# Patient Record
Sex: Male | Born: 1950 | Race: Black or African American | Hispanic: No | Marital: Single | Smoking: Never smoker
Health system: Southern US, Community
[De-identification: ages and names within clinical notes are randomized; demographics above are authoritative.]

## PROBLEM LIST (undated history)

## (undated) DIAGNOSIS — E119 Type 2 diabetes mellitus without complications: Secondary | ICD-10-CM

## (undated) DIAGNOSIS — I10 Essential (primary) hypertension: Secondary | ICD-10-CM

## (undated) DIAGNOSIS — R739 Hyperglycemia, unspecified: Secondary | ICD-10-CM

## (undated) DIAGNOSIS — E785 Hyperlipidemia, unspecified: Secondary | ICD-10-CM

## (undated) DIAGNOSIS — N189 Chronic kidney disease, unspecified: Secondary | ICD-10-CM

## (undated) DIAGNOSIS — N179 Acute kidney failure, unspecified: Secondary | ICD-10-CM

## (undated) DIAGNOSIS — R748 Abnormal levels of other serum enzymes: Secondary | ICD-10-CM

## (undated) DIAGNOSIS — D631 Anemia in chronic kidney disease: Secondary | ICD-10-CM

## (undated) HISTORY — DX: Hyperlipidemia, unspecified: E78.5

## (undated) HISTORY — DX: Anemia in chronic kidney disease: D63.1

## (undated) HISTORY — DX: Hyperglycemia, unspecified: R73.9

## (undated) HISTORY — DX: Abnormal levels of other serum enzymes: R74.8

## (undated) HISTORY — DX: Acute kidney failure, unspecified: N17.9

## (undated) HISTORY — DX: Chronic kidney disease, unspecified: N18.9

---

## 2017-09-02 ENCOUNTER — Encounter (HOSPITAL_COMMUNITY): Payer: Self-pay

## 2017-09-02 ENCOUNTER — Other Ambulatory Visit: Payer: Self-pay

## 2017-09-02 ENCOUNTER — Inpatient Hospital Stay (HOSPITAL_COMMUNITY)
Admission: EM | Admit: 2017-09-02 | Discharge: 2017-09-04 | DRG: 641 | Disposition: A | Discharge status: Home / Self Care | Payer: Medicare Other | Attending: Family Medicine | Admitting: Family Medicine

## 2017-09-02 ENCOUNTER — Ambulatory Visit (HOSPITAL_COMMUNITY)
Admission: EM | Admit: 2017-09-02 | Discharge: 2017-09-02 | Disposition: A | Discharge status: Home / Self Care | Payer: Medicare Other | Source: Home / Self Care | Attending: Internal Medicine | Admitting: Internal Medicine

## 2017-09-02 DIAGNOSIS — E118 Type 2 diabetes mellitus with unspecified complications: Secondary | ICD-10-CM

## 2017-09-02 DIAGNOSIS — D631 Anemia in chronic kidney disease: Secondary | ICD-10-CM | POA: Diagnosis present

## 2017-09-02 DIAGNOSIS — E785 Hyperlipidemia, unspecified: Secondary | ICD-10-CM | POA: Diagnosis present

## 2017-09-02 DIAGNOSIS — K760 Fatty (change of) liver, not elsewhere classified: Secondary | ICD-10-CM | POA: Diagnosis present

## 2017-09-02 DIAGNOSIS — R112 Nausea with vomiting, unspecified: Secondary | ICD-10-CM

## 2017-09-02 DIAGNOSIS — R0601 Orthopnea: Secondary | ICD-10-CM | POA: Diagnosis present

## 2017-09-02 DIAGNOSIS — E1129 Type 2 diabetes mellitus with other diabetic kidney complication: Secondary | ICD-10-CM | POA: Diagnosis present

## 2017-09-02 DIAGNOSIS — R7989 Other specified abnormal findings of blood chemistry: Secondary | ICD-10-CM | POA: Diagnosis present

## 2017-09-02 DIAGNOSIS — R1013 Epigastric pain: Secondary | ICD-10-CM

## 2017-09-02 DIAGNOSIS — E1165 Type 2 diabetes mellitus with hyperglycemia: Secondary | ICD-10-CM | POA: Diagnosis present

## 2017-09-02 DIAGNOSIS — E860 Dehydration: Secondary | ICD-10-CM | POA: Diagnosis present

## 2017-09-02 DIAGNOSIS — R402143 Coma scale, eyes open, spontaneous, at hospital admission: Secondary | ICD-10-CM | POA: Diagnosis present

## 2017-09-02 DIAGNOSIS — N179 Acute kidney failure, unspecified: Secondary | ICD-10-CM

## 2017-09-02 DIAGNOSIS — N189 Chronic kidney disease, unspecified: Secondary | ICD-10-CM | POA: Diagnosis present

## 2017-09-02 DIAGNOSIS — R739 Hyperglycemia, unspecified: Secondary | ICD-10-CM | POA: Diagnosis not present

## 2017-09-02 DIAGNOSIS — R197 Diarrhea, unspecified: Secondary | ICD-10-CM | POA: Diagnosis present

## 2017-09-02 DIAGNOSIS — R402253 Coma scale, best verbal response, oriented, at hospital admission: Secondary | ICD-10-CM | POA: Diagnosis present

## 2017-09-02 DIAGNOSIS — Z7984 Long term (current) use of oral hypoglycemic drugs: Secondary | ICD-10-CM | POA: Diagnosis not present

## 2017-09-02 DIAGNOSIS — R531 Weakness: Secondary | ICD-10-CM

## 2017-09-02 DIAGNOSIS — E861 Hypovolemia: Secondary | ICD-10-CM | POA: Diagnosis present

## 2017-09-02 DIAGNOSIS — Z23 Encounter for immunization: Secondary | ICD-10-CM | POA: Diagnosis not present

## 2017-09-02 DIAGNOSIS — Z79899 Other long term (current) drug therapy: Secondary | ICD-10-CM | POA: Diagnosis not present

## 2017-09-02 DIAGNOSIS — R402363 Coma scale, best motor response, obeys commands, at hospital admission: Secondary | ICD-10-CM | POA: Diagnosis present

## 2017-09-02 DIAGNOSIS — E875 Hyperkalemia: Principal | ICD-10-CM

## 2017-09-02 DIAGNOSIS — R6883 Chills (without fever): Secondary | ICD-10-CM | POA: Diagnosis present

## 2017-09-02 DIAGNOSIS — I129 Hypertensive chronic kidney disease with stage 1 through stage 4 chronic kidney disease, or unspecified chronic kidney disease: Secondary | ICD-10-CM | POA: Diagnosis present

## 2017-09-02 DIAGNOSIS — R0600 Dyspnea, unspecified: Secondary | ICD-10-CM

## 2017-09-02 DIAGNOSIS — E1122 Type 2 diabetes mellitus with diabetic chronic kidney disease: Secondary | ICD-10-CM | POA: Diagnosis present

## 2017-09-02 HISTORY — DX: Acute kidney failure, unspecified: N17.9

## 2017-09-02 HISTORY — DX: Type 2 diabetes mellitus without complications: E11.9

## 2017-09-02 HISTORY — DX: Essential (primary) hypertension: I10

## 2017-09-02 HISTORY — DX: Type 2 diabetes mellitus with unspecified complications: E11.8

## 2017-09-02 LAB — COMPREHENSIVE METABOLIC PANEL
ALBUMIN: 4.5 g/dL (ref 3.5–5.0)
ALBUMIN: 4.1 g/dL (ref 3.5–5.0)
ALK PHOS: 90 U/L (ref 38–126)
ALK PHOS: 77 U/L (ref 38–126)
ALT: 50 U/L — ABNORMAL HIGH (ref 0–44)
ALT: 42 U/L (ref 0–44)
ANION GAP: 7 (ref 5–15)
AST: 58 U/L — AB (ref 15–41)
AST: 34 U/L (ref 15–41)
Anion gap: 14 (ref 5–15)
BILIRUBIN TOTAL: 0.8 mg/dL (ref 0.3–1.2)
BUN: 49 mg/dL — AB (ref 8–23)
BUN: 47 mg/dL — ABNORMAL HIGH (ref 8–23)
CALCIUM: 9.7 mg/dL (ref 8.9–10.3)
CHLORIDE: 107 mmol/L (ref 98–111)
CO2: 17 mmol/L — ABNORMAL LOW (ref 22–32)
CO2: 24 mmol/L (ref 22–32)
Calcium: 10.1 mg/dL (ref 8.9–10.3)
Chloride: 101 mmol/L (ref 98–111)
Creatinine, Ser: 2.31 mg/dL — ABNORMAL HIGH (ref 0.61–1.24)
Creatinine, Ser: 2.21 mg/dL — ABNORMAL HIGH (ref 0.61–1.24)
GFR calc Af Amer: 33 mL/min — ABNORMAL LOW (ref >60)
GFR calc Af Amer: 34 mL/min — ABNORMAL LOW (ref >60)
GFR calc non Af Amer: 30 mL/min — ABNORMAL LOW (ref >60)
GFR, EST NON AFRICAN AMERICAN: 28 mL/min — AB (ref >60)
GLUCOSE: 711 mg/dL — AB (ref 70–99)
GLUCOSE: 475 mg/dL — AB (ref 70–99)
Potassium: >7.5 mmol/L (ref 3.5–5.1)
Sodium: 132 mmol/L — ABNORMAL LOW (ref 135–145)
Sodium: 138 mmol/L (ref 135–145)
Total Bilirubin: 0.8 mg/dL (ref 0.3–1.2)
Total Protein: 8.1 g/dL (ref 6.5–8.1)
Total Protein: 7.3 g/dL (ref 6.5–8.1)

## 2017-09-02 LAB — BASIC METABOLIC PANEL
Anion gap: 11 (ref 5–15)
BUN: 44 mg/dL — ABNORMAL HIGH (ref 8–23)
CALCIUM: 9.6 mg/dL (ref 8.9–10.3)
CHLORIDE: 108 mmol/L (ref 98–111)
CO2: 25 mmol/L (ref 22–32)
CREATININE: 2.19 mg/dL — AB (ref 0.61–1.24)
GFR, EST AFRICAN AMERICAN: 35 mL/min — AB (ref >60)
GFR, EST NON AFRICAN AMERICAN: 30 mL/min — AB (ref >60)
Glucose, Bld: 414 mg/dL — ABNORMAL HIGH (ref 70–99)
Potassium: 5.1 mmol/L (ref 3.5–5.1)
SODIUM: 144 mmol/L (ref 135–145)

## 2017-09-02 LAB — CBC WITH DIFFERENTIAL/PLATELET
ABS IMMATURE GRANULOCYTES: 0 10*3/uL (ref 0.0–0.1)
BASOS ABS: 0.1 10*3/uL (ref 0.0–0.1)
Basophils Relative: 1 %
Eosinophils Absolute: 0.1 10*3/uL (ref 0.0–0.7)
Eosinophils Relative: 1 %
HCT: 50.1 % (ref 39.0–52.0)
Hemoglobin: 16.9 g/dL (ref 13.0–17.0)
Immature Granulocytes: 0 %
LYMPHS PCT: 34 %
Lymphs Abs: 2.5 10*3/uL (ref 0.7–4.0)
MCH: 31.1 pg (ref 26.0–34.0)
MCHC: 33.7 g/dL (ref 30.0–36.0)
MCV: 92.3 fL (ref 78.0–100.0)
MONO ABS: 0.5 10*3/uL (ref 0.1–1.0)
MONOS PCT: 7 %
NEUTROS ABS: 4.3 10*3/uL (ref 1.7–7.7)
Neutrophils Relative %: 57 %
Platelets: 364 10*3/uL (ref 150–400)
RBC: 5.43 MIL/uL (ref 4.22–5.81)
RDW: 11.6 % (ref 11.5–15.5)
WBC: 7.5 10*3/uL (ref 4.0–10.5)

## 2017-09-02 LAB — CBG MONITORING, ED
GLUCOSE-CAPILLARY: 372 mg/dL — AB (ref 70–99)
Glucose-Capillary: 417 mg/dL — ABNORMAL HIGH (ref 70–99)

## 2017-09-02 LAB — I-STAT CHEM 8, ED
BUN: 74 mg/dL — AB (ref 8–23)
CHLORIDE: 109 mmol/L (ref 98–111)
Calcium, Ion: 1.22 mmol/L (ref 1.15–1.40)
Creatinine, Ser: 2.1 mg/dL — ABNORMAL HIGH (ref 0.61–1.24)
Glucose, Bld: 453 mg/dL — ABNORMAL HIGH (ref 70–99)
HEMATOCRIT: 51 % (ref 39.0–52.0)
Hemoglobin: 17.3 g/dL — ABNORMAL HIGH (ref 13.0–17.0)
POTASSIUM: 8.5 mmol/L — AB (ref 3.5–5.1)
Sodium: 137 mmol/L (ref 135–145)
TCO2: 25 mmol/L (ref 22–32)

## 2017-09-02 LAB — POCT I-STAT, CHEM 8
BUN: 54 mg/dL — AB (ref 8–23)
CHLORIDE: 101 mmol/L (ref 98–111)
CREATININE: 2.1 mg/dL — AB (ref 0.61–1.24)
Calcium, Ion: 1.18 mmol/L (ref 1.15–1.40)
Glucose, Bld: 681 mg/dL (ref 70–99)
HCT: 55 % — ABNORMAL HIGH (ref 39.0–52.0)
Hemoglobin: 18.7 g/dL — ABNORMAL HIGH (ref 13.0–17.0)
POTASSIUM: 7.8 mmol/L — AB (ref 3.5–5.1)
Sodium: 132 mmol/L — ABNORMAL LOW (ref 135–145)
TCO2: 21 mmol/L — ABNORMAL LOW (ref 22–32)

## 2017-09-02 LAB — URINALYSIS, ROUTINE W REFLEX MICROSCOPIC
BILIRUBIN URINE: NEGATIVE
Bacteria, UA: NONE SEEN
Glucose, UA: >=500 mg/dL — AB
Hgb urine dipstick: NEGATIVE
Ketones, ur: 5 mg/dL — AB
Leukocytes, UA: NEGATIVE
Nitrite: NEGATIVE
PROTEIN: NEGATIVE mg/dL
SPECIFIC GRAVITY, URINE: 1.026 (ref 1.005–1.030)
pH: 6 (ref 5.0–8.0)

## 2017-09-02 LAB — LIPASE, BLOOD: Lipase: 42 U/L (ref 11–51)

## 2017-09-02 MED ORDER — ALBUTEROL SULFATE (2.5 MG/3ML) 0.083% IN NEBU: 10.0000 mg | INHALATION_SOLUTION | Freq: Once | RESPIRATORY_TRACT | Status: DC

## 2017-09-02 MED ORDER — SODIUM CHLORIDE 0.9 % IV SOLN
INTRAVENOUS | Status: DC
  Administered 2017-09-03 (×3): via INTRAVENOUS

## 2017-09-02 MED ORDER — SODIUM POLYSTYRENE SULFONATE 15 GM/60ML PO SUSP
30.0000 g | Freq: Once | ORAL | Status: AC
  Administered 2017-09-02: 30 g via ORAL
  Filled 2017-09-02: qty 120

## 2017-09-02 MED ORDER — SODIUM CHLORIDE 0.9 % IV SOLN
1.0000 g | Freq: Once | INTRAVENOUS | Status: AC
  Administered 2017-09-03: 1 g via INTRAVENOUS
  Filled 2017-09-02: qty 10

## 2017-09-02 MED ORDER — SODIUM CHLORIDE 0.9 % IV BOLUS
500.0000 mL | Freq: Once | INTRAVENOUS | Status: AC
  Administered 2017-09-02: 500 mL via INTRAVENOUS

## 2017-09-02 MED ORDER — PNEUMOCOCCAL VAC POLYVALENT 25 MCG/0.5ML IJ INJ
0.5000 mL | INJECTION | INTRAMUSCULAR | Status: AC
  Administered 2017-09-03: 0.5 mL via INTRAMUSCULAR
  Filled 2017-09-02: qty 0.5

## 2017-09-02 MED ORDER — ALBUTEROL (5 MG/ML) CONTINUOUS INHALATION SOLN
INHALATION_SOLUTION | RESPIRATORY_TRACT | Status: AC
  Administered 2017-09-02: 21:00:00
  Filled 2017-09-02: qty 20

## 2017-09-02 MED ORDER — ALBUTEROL SULFATE (2.5 MG/3ML) 0.083% IN NEBU
10.0000 mg | INHALATION_SOLUTION | Freq: Once | RESPIRATORY_TRACT | Status: DC
  Filled 2017-09-02: qty 12

## 2017-09-02 MED ORDER — SODIUM BICARBONATE 8.4 % IV SOLN
50.0000 meq | Freq: Once | INTRAVENOUS | Status: AC
  Administered 2017-09-03: 50 meq via INTRAVENOUS
  Filled 2017-09-02: qty 50

## 2017-09-02 MED ORDER — INSULIN ASPART 100 UNIT/ML IV SOLN: 10.0000 [IU] | Freq: Once | INTRAVENOUS | Status: DC

## 2017-09-02 MED ORDER — INSULIN ASPART 100 UNIT/ML ~~LOC~~ SOLN
10.0000 [IU] | Freq: Once | SUBCUTANEOUS | Status: AC
  Administered 2017-09-02: 10 [IU] via INTRAVENOUS
  Filled 2017-09-02: qty 1

## 2017-09-02 MED ORDER — ONDANSETRON 4 MG PO TBDP
4.0000 mg | ORAL_TABLET | Freq: Once | ORAL | Status: AC
  Administered 2017-09-02: 4 mg via ORAL

## 2017-09-02 MED ORDER — ALBUTEROL SULFATE (2.5 MG/3ML) 0.083% IN NEBU
10.0000 mg | INHALATION_SOLUTION | Freq: Once | RESPIRATORY_TRACT | Status: AC
  Administered 2017-09-02: 10 mg via RESPIRATORY_TRACT
  Filled 2017-09-02: qty 12

## 2017-09-02 MED ORDER — SODIUM BICARBONATE 8.4 % IV SOLN
50.0000 meq | Freq: Once | INTRAVENOUS | Status: AC
  Administered 2017-09-02: 50 meq via INTRAVENOUS
  Filled 2017-09-02: qty 50

## 2017-09-02 MED ORDER — SODIUM ZIRCONIUM CYCLOSILICATE 10 G PO PACK
10.0000 g | PACK | Freq: Every day | ORAL | Status: DC
  Administered 2017-09-03 (×2): 10 g via ORAL
  Filled 2017-09-02 (×2): qty 1

## 2017-09-02 MED ORDER — FUROSEMIDE 10 MG/ML IJ SOLN
40.0000 mg | Freq: Once | INTRAMUSCULAR | Status: AC
  Administered 2017-09-03: 40 mg via INTRAVENOUS
  Filled 2017-09-02: qty 4

## 2017-09-02 MED ORDER — INSULIN ASPART 100 UNIT/ML IV SOLN
10.0000 [IU] | Freq: Once | INTRAVENOUS | Status: AC
  Administered 2017-09-02: 10 [IU] via INTRAVENOUS
  Filled 2017-09-02: qty 0.1

## 2017-09-02 MED ORDER — SODIUM CHLORIDE 0.9 % IV BOLUS
2000.0000 mL | Freq: Once | INTRAVENOUS | Status: AC
  Administered 2017-09-03: 2000 mL via INTRAVENOUS

## 2017-09-02 MED ORDER — CALCIUM GLUCONATE 10 % IV SOLN
1.0000 g | Freq: Once | INTRAVENOUS | Status: AC
  Administered 2017-09-02: 1 g via INTRAVENOUS
  Filled 2017-09-02: qty 10

## 2017-09-02 MED ORDER — ONDANSETRON 4 MG PO TBDP
ORAL_TABLET | ORAL | Status: AC
  Filled 2017-09-02: qty 1

## 2017-09-02 MED ORDER — SODIUM CHLORIDE 0.9 % IV BOLUS
1000.0000 mL | Freq: Once | INTRAVENOUS | Status: AC
  Administered 2017-09-02: 1000 mL via INTRAVENOUS

## 2017-09-02 NOTE — ED Notes (Addendum)
Dennis from lab reported 2nd specimen is hemolyzed. RN to draw 3rd specimen.

## 2017-09-02 NOTE — Consult Note (Signed)
[facility] Renal Consultation Note  Requesting MD:  Dr. Marvis Repress Indication for Consultation:  hyperkalemia  HPI: 67yo AAM with HTN, DM who presented to the ED today from urgent care for hyperkalemia.    He has a 4 day history of N/V/D and developed profound weakness today after a haircut prompting urgent care visit where his potassium was found to be 7s and he was sent to the ED.  He was unable to stand unassisted here. K confirmed to be 7.8, Cr 2.1, BG 681. EKG with some mild peaked T waves.  He was treated with calcium, albuterol, insulin, sodium bicarb, kayexelate.  Repeat K has been persistently high.  BPs in the ED range 90/70s-100/80s.  HR initially 122, > 100 for the duration.    He currently reports his weakness is a bit better.  He feels his UOP has been relatively normal until the last day.  He notes it's yellow with no difficulty voiding.  UA in ED SG 1.026, o/w bland.  Daily ASA in the last few days, no other NSAIDs, no K supplements.    Cameron Glover is a 67 y.o. male.   Creatinine, Ser  Date/Time Value Ref Range Status  09/02/2017 06:06 PM 2.10 (H) 0.61 - 1.24 mg/dL Final  09/02/2017 05:38 PM 2.21 (H) 0.61 - 1.24 mg/dL Final  09/02/2017 01:20 PM 2.31 (H) 0.61 - 1.24 mg/dL Final  09/02/2017 01:15 PM 2.10 (H) 0.61 - 1.24 mg/dL Final    PMHx:   Past Medical History:  Diagnosis Date  . Diabetes mellitus without complication ([place])   . Hypertension     History reviewed. No pertinent surgical history.  Family Hx:  Family History  Problem Relation Age of Onset  . Kidney failure Sister   . Cancer Brother   . CAD Brother   . Diabetes Brother   . Diabetes Other     Social History:  reports that he has never smoked. He has never used smokeless tobacco. He reports that he drank alcohol. He reports that he does not use drugs.  Allergies: No Known Allergies  Medications: Prior to Admission medications   Medication Sig Start Date End Date Taking?  Authorizing Provider  atorvastatin (LIPITOR) 40 MG tablet Take 40 mg by mouth daily.   Yes [provider]  lisinopril (PRINIVIL,ZESTRIL) 10 MG tablet Take 10 mg by mouth daily.   Yes [provider]  metFORMIN (GLUCOPHAGE) 1000 MG tablet Take 1,000 mg by mouth 2 (two) times daily with a meal.   Yes [provider]    I have reviewed the patient's current medications.  Labs:  Results for orders placed or performed during the hospital encounter of 09/02/17 (from the past 48 hour(s))  CBC with Differential/Platelet     Status: None   Collection Time: 09/02/17  2:21 PM  Result Value Ref Range   WBC 7.5 4.0 - 10.5 K/uL   RBC 5.43 4.22 - 5.81 MIL/uL   Hemoglobin 16.9 13.0 - 17.0 g/dL   HCT 50.1 39.0 - 52.0 %   MCV 92.3 78.0 - 100.0 fL   MCH 31.1 26.0 - 34.0 pg   MCHC 33.7 30.0 - 36.0 g/dL   RDW 11.6 11.5 - 15.5 %   Platelets 364 150 - 400 K/uL   Neutrophils Relative % 57 %   Neutro Abs 4.3 1.7 - 7.7 K/uL   Lymphocytes Relative 34 %   Lymphs Abs 2.5 0.7 - 4.0 K/uL   Monocytes Relative 7 %  Monocytes Absolute 0.5 0.1 - 1.0 K/uL   Eosinophils Relative 1 %   Eosinophils Absolute 0.1 0.0 - 0.7 K/uL   Basophils Relative 1 %   Basophils Absolute 0.1 0.0 - 0.1 K/uL   Immature Granulocytes 0 %   Abs Immature Granulocytes 0.0 0.0 - 0.1 K/uL    Comment: Performed at [facility] [address] [place], [place] [address]  Comprehensive metabolic panel     Status: Abnormal   Collection Time: 09/02/17  5:38 PM  Result Value Ref Range   Sodium 138 135 - 145 mmol/L   Potassium >7.5 (HH) 3.5 - 5.1 mmol/L    Comment: NO VISIBLE HEMOLYSIS CRITICAL RESULT CALLED TO, READ BACK BY AND VERIFIED WITH: Lonia Farber [id number] 10/03/17 D BRADLEY    Chloride 107 98 - 111 mmol/L   CO2 24 22 - 32 mmol/L   Glucose, Bld 475 (H) 70 - 99 mg/dL   BUN 47 (H) 8 - 23 mg/dL   Creatinine, Ser 2.21 (H) 0.61 - 1.24 mg/dL   Calcium 9.7 8.9 - 10.3 mg/dL   Total Protein 7.3 6.5 - 8.1 g/dL    Albumin 4.1 3.5 - 5.0 g/dL   AST 34 15 - 41 U/L   ALT 42 0 - 44 U/L   Alkaline Phosphatase 77 38 - 126 U/L   Total Bilirubin 0.8 0.3 - 1.2 mg/dL   GFR calc non Af Amer 30 (L) >60 mL/min   GFR calc Af Amer 34 (L) >60 mL/min    Comment: (NOTE) The eGFR has been calculated using the CKD EPI equation. This calculation has not been validated in all clinical situations. eGFR's persistently <60 mL/min signify possible Chronic Kidney Disease.    Anion gap 7 5 - 15    Comment: Performed at [facility] [address], [place], [place] [address]  Lipase, blood     Status: None   Collection Time: 09/02/17  5:38 PM  Result Value Ref Range   Lipase 42 11 - 51 U/L    Comment: Performed at [facility] [address], [place], [place] [address]  I-stat chem 8, ed     Status: Abnormal   Collection Time: 09/02/17  6:06 PM  Result Value Ref Range   Sodium 137 135 - 145 mmol/L   Potassium 8.5 (HH) 3.5 - 5.1 mmol/L   Chloride 109 98 - 111 mmol/L   BUN 74 (H) 8 - 23 mg/dL   Creatinine, Ser 2.10 (H) 0.61 - 1.24 mg/dL   Glucose, Bld 453 (H) 70 - 99 mg/dL   Calcium, Ion 1.22 1.15 - 1.40 mmol/L   TCO2 25 22 - 32 mmol/L   Hemoglobin 17.3 (H) 13.0 - 17.0 g/dL   HCT 51.0 39.0 - 52.0 %   Comment NOTIFIED PHYSICIAN   Urinalysis, Routine w reflex microscopic     Status: Abnormal   Collection Time: 09/02/17  7:24 PM  Result Value Ref Range   Color, Urine YELLOW YELLOW   APPearance CLEAR CLEAR   Specific Gravity, Urine 1.026 1.005 - 1.030   pH 6.0 5.0 - 8.0   Glucose, UA >=500 (A) NEGATIVE mg/dL   Hgb urine dipstick NEGATIVE NEGATIVE   Bilirubin Urine NEGATIVE NEGATIVE   Ketones, ur 5 (A) NEGATIVE mg/dL   Protein, ur NEGATIVE NEGATIVE mg/dL   Nitrite NEGATIVE NEGATIVE   Leukocytes, UA NEGATIVE NEGATIVE   RBC / HPF 0-5 0 - 5 RBC/hpf   WBC, UA 0-5 0 -  5 WBC/hpf   Bacteria, UA NONE SEEN NONE SEEN    Comment: Performed at [facility] [address], [place], [place] [address]   CBG monitoring, ED     Status: Abnormal   Collection Time: 09/02/17  8:00 PM  Result Value Ref Range   Glucose-Capillary 417 (H) 70 - 99 mg/dL  CBG monitoring, ED     Status: Abnormal   Collection Time: 09/02/17 10:43 PM  Result Value Ref Range   Glucose-Capillary 372 (H) 70 - 99 mg/dL     ROS:  Pertinent items are noted in HPI.  Physical Exam: Vitals:   09/02/17 2238 09/02/17 2245  BP: (!) 126/104 103/75  Pulse: (!) 118 (!) 121  Resp: 14 13  Temp:    SpO2: 100% 98%     General: nontoxic HEENT: MM tacky Eyes: anicteric Neck: JVP flat Heart: tachycardic, regular, no rub Lungs: clear Abdomen: soft, mildly TTP  Extremities: no edema Skin: no rashes Neuro: normally conversant, a bit tremulous, nonfocal  Assessment/Plan: 1.  Severe hyperkalemia:  Initial EKG with some peaking of T waves, f/u at 6:16 pm with less peaking.  He hasn't had a serum K since that time.  One was sent 45 min ago and the lab will process it now - they estimate 30 minutes.  I hope his hyperkalemia can be managed medically but if K not improving may need dialysis to correct the issue.  For now while the lab is pending (and if we need to make preparations for dialysis) recommend the following:  --2L NS boluses then 172m/hr or as clinically guided (more boluses if needed) --Lasix 40 IV (not for volume overload, but to promote K excretion); he's dry so we will need to keep giving fluids as well --Lokelma 10g stat --Hold ACE-I  2.  AKI:  Most likely prerenal insult in setting of GI issues.   --Monitor labs and further w/u if not improving.  --Renal UKoreawhen more stable. --Hold ACE-I --I/Os  Page or call cell phone [phone] issues.  KJannifer HickA 09/02/2017, 11:19 PM

## 2017-09-02 NOTE — ED Notes (Signed)
ED Provider at bedside. 

## 2017-09-02 NOTE — ED Notes (Signed)
Nephrology at bedside

## 2017-09-02 NOTE — Discharge Instructions (Addendum)
Zofran given in office Recommend further evaluation and management in the ED for presenting symptoms and hyperkalemia Patient aware and in agreement with this plan. Going to ER with escort.

## 2017-09-02 NOTE — ED Triage Notes (Signed)
Pt presents with vomiting and body weakness past few days.

## 2017-09-02 NOTE — ED Notes (Signed)
Lab reported CMP/lipase specimen hemolyzed. Additional specimen sent.

## 2017-09-02 NOTE — ED Provider Notes (Signed)
[facility]   [id number] 09/02/17 Arrival Time: 1203  CC: ABDOMINAL DISCOMFORT  SUBJECTIVE:  Donaven Criswell is a 67 y.o. male hx significant for HTN and DM, who presents with complaint of 1-2 episodes of nonbloody or nonbilious vomiting per day for the past 4-5 days.  Denies a precipitating event, or trauma.  Denies eating anything unusual or close contacts with similar symptoms. Denies recent antibiotic use or travel.  Complains of associated diffuse abdominal discomfort with vomiting.  Has tried aspirin and PEPTO without relief.  Denies alleviating symptoms.  Symptoms made worse with eating.  Denies similar symptoms in the past.  Last BM this morning with watery stools.  Complains of associated fatigue, chills, decreased appetite, nausea, and watery diarrhea.   Denies fever, weight changes, chest pain, SOB, constipation, hematochezia, melena, dysuria, difficulty urinating, increased frequency or urgency, flank pain, loss of bowel or bladder function.  Last colonoscopy 2 years ago and normal.    No LMP for male patient.  ROS: As per HPI.  Past Medical History:  Diagnosis Date  . Diabetes mellitus without complication ([place])   . Hypertension    History reviewed. No pertinent surgical history. No Known Allergies No current facility-administered medications on file prior to encounter.    Current Outpatient Medications on File Prior to Encounter  Medication Sig Dispense Refill  . atorvastatin (LIPITOR) 40 MG tablet Take 40 mg by mouth daily.    Marland Kitchen lisinopril (PRINIVIL,ZESTRIL) 10 MG tablet Take 10 mg by mouth daily.    . metFORMIN (GLUCOPHAGE) 1000 MG tablet Take 1,000 mg by mouth 2 (two) times daily with a meal.     Social History   Socioeconomic History  . Marital status: Single    Spouse name: Not on file  . Number of children: Not on file  . Years of education: Not on file  . Highest education level: Not on file  Occupational History  . Not on file  Social Needs    . Financial resource strain: Not on file  . Food insecurity:    Worry: Not on file    Inability: Not on file  . Transportation needs:    Medical: Not on file    Non-medical: Not on file  Tobacco Use  . Smoking status: Not on file  Substance and Sexual Activity  . Alcohol use: Not on file  . Drug use: Not on file  . Sexual activity: Not on file  Lifestyle  . Physical activity:    Days per week: Not on file    Minutes per session: Not on file  . Stress: Not on file  Relationships  . Social connections:    Talks on phone: Not on file    Gets together: Not on file    Attends religious service: Not on file    Active member of club or organization: Not on file    Attends meetings of clubs or organizations: Not on file    Relationship status: Not on file  . Intimate partner violence:    Fear of current or ex partner: Not on file    Emotionally abused: Not on file    Physically abused: Not on file    Forced sexual activity: Not on file  Other Topics Concern  . Not on file  Social History Narrative  . Not on file   History reviewed. No pertinent family history.   OBJECTIVE:  Vitals:   09/02/17 1225 09/02/17 1233  BP:  (!) 135/91  Pulse: Marland Kitchen)  122   Resp: 20   Temp: 98.3 F (36.8 C)   TempSrc: Oral   SpO2: 97%     General appearance: AO; appears fatigue; not in acute distress HEENT: NCAT.  Oropharynx clear.  Lungs: clear to auscultation bilaterally without adventitious breath sounds Heart: tachycardic.  Weak thready radial pulses bilaterally Abdomen: soft, non-distended; normal active bowel sounds; tender to palpation about the epigastric region; nontender at McBurney's point; positive Murphy's sign; negative rebound; guarding Back: no CVA tenderness Extremities: no edema; symmetrical with no gross deformities Skin: warm and dry Neurologic: normal gait Psychological: alert and cooperative; normal mood and affect  Labs: Results for orders placed or performed during  the hospital encounter of 09/02/17 (from the past 24 hour(s))  I-STAT, chem 8     Status: Abnormal   Collection Time: 09/02/17  1:15 PM  Result Value Ref Range   Sodium 132 (L) 135 - 145 mmol/L   Potassium 7.8 (HH) 3.5 - 5.1 mmol/L   Chloride 101 98 - 111 mmol/L   BUN 54 (H) 8 - 23 mg/dL   Creatinine, Ser 2.10 (H) 0.61 - 1.24 mg/dL   Glucose, Bld 681 (HH) 70 - 99 mg/dL   Calcium, Ion 1.18 1.15 - 1.40 mmol/L   TCO2 21 (L) 22 - 32 mmol/L   Hemoglobin 18.7 (H) 13.0 - 17.0 g/dL   HCT 55.0 (H) 39.0 - 52.0 %   Comment NOTIFIED PHYSICIAN    EKG:  EKG sinus tachycardia without ST elevations, depressions, or prolonged PR interval.  No narrowing or widening of the QRS complexes.  Reviewed EKG with Dr. Valere Dross.       MDM:  67 year old with medical hx significant for DM and HTN.  Patient presenting with nonbloody, nonbilious vomiting, epigastric pain, and watery diarrhea x 4-5 days.  VS significant for tachycardia.  Patient endorses epigastric pain, guarding, and positive murphy's sign of exam.  Blood work showed hyperkalemia, and hyperglycemia as well as elevated creatinine and BUN.  EKG showed tachycardia, but no obvious ST elevations or depressions or widening of the QRS complexes.  Review patient case with Dr. Valere Dross.  We are recommending further evaluation and management in the ED.  Patient transferred in wheelchair by RN in stable condition.    ASSESSMENT & PLAN:  1. Nausea and vomiting, intractability of vomiting not specified, unspecified vomiting type   2. Abdominal pain, epigastric   3. Hyperkalemia   4. Hyperglycemia     Meds ordered this encounter  Medications  . ondansetron (ZOFRAN-ODT) disintegrating tablet 4 mg   Zofran given in office Recommend further evaluation and management in the ED for presenting symptoms and hyperkalemia Patient aware and in agreement with this plan. Going to ER with escort.    Reviewed expectations re: course of current medical issues. Questions  answered. Outlined signs and symptoms indicating need for more acute intervention. Patient verbalized understanding. After Visit Summary given.   Lestine Box, PA-C 09/02/17 1406

## 2017-09-02 NOTE — ED Provider Notes (Signed)
[facility] EMERGENCY DEPARTMENT Provider Note   CSN: [id number] Arrival date & time: 09/02/17  1354     History   Chief Complaint Chief Complaint  Patient presents with  . Abnormal Lab    HPI Cameron Glover is a 67 y.o. male.  Patient with history of diabetes on metformin, hypertension, hyperlipidemia, recently located from [place] to [place] to help care for his sister who has recently passed away --presented to urgent care today with complaint of nausea, vomiting and abdominal pain over the past 5 days or so.  Patient has been having difficulty taking his medications because of this.  Also complains of weakness in legs and it feels like they will give out when he is standing.  At urgent care he was found to have a potassium in the sevens.  Patient denies any chest pain or shortness of breath.  He has not had any fevers.  No urinary changes.  He has had normal nonbloody bowel movements.  He has tried aspirin and Pepto-Bismol without improvement.  He has not had symptoms like this in the past.  He reports decreased appetite and fatigue.  Onset of symptoms acute.  Course is constant.  Nothing makes symptoms better or worse.     Past Medical History:  Diagnosis Date  . Diabetes mellitus without complication ([place])   . Hypertension     There are no active problems to display for this patient.   History reviewed. No pertinent surgical history.      Home Medications    Prior to Admission medications   Medication Sig Start Date End Date Taking? Authorizing Provider  atorvastatin (LIPITOR) 40 MG tablet Take 40 mg by mouth daily.   Yes [provider]  lisinopril (PRINIVIL,ZESTRIL) 10 MG tablet Take 10 mg by mouth daily.   Yes [provider]  metFORMIN (GLUCOPHAGE) 1000 MG tablet Take 1,000 mg by mouth 2 (two) times daily with a meal.   Yes [provider]    Family History No family history on file.  Social  History Social History   Tobacco Use  . Smoking status: Never Smoker  . Smokeless tobacco: Never Used  Substance Use Topics  . Alcohol use: Not Currently  . Drug use: Never     Allergies   Patient has no known allergies.   Review of Systems Review of Systems  Constitutional: Negative for fever.  HENT: Negative for rhinorrhea and sore throat.   Eyes: Negative for redness.  Respiratory: Negative for cough and shortness of breath.   Cardiovascular: Negative for chest pain.  Gastrointestinal: Positive for nausea and vomiting. Negative for abdominal pain and diarrhea.  Genitourinary: Negative for dysuria.  Musculoskeletal: Negative for myalgias.  Skin: Negative for rash.  Neurological: Negative for headaches.     Physical Exam Updated Vital Signs BP 124/87   Pulse (!) 114   Temp 98 F (36.7 C) (Axillary)   Resp 15   Ht 5\' 4"  (1.626 m)   Wt 81.6 kg   SpO2 100%   BMI 30.90 kg/m   Physical Exam  Constitutional: He appears well-developed and well-nourished.  HENT:  Head: Normocephalic and atraumatic.  Mouth/Throat: Oropharynx is clear and moist.  Eyes: Conjunctivae are normal. Right eye exhibits no discharge. Left eye exhibits no discharge.  Neck: Normal range of motion. Neck supple.  Cardiovascular: Normal rate, regular rhythm and normal heart sounds.  Pulmonary/Chest: Effort normal and breath sounds normal. No respiratory distress.  Abdominal: Soft. There is tenderness.  There is no rebound and no guarding.  Mild generalized tenderness, no focal tenderness on exam.  Musculoskeletal: He exhibits no edema or tenderness.  Neurological: He is alert.  Skin: Skin is warm and dry.  Psychiatric: He has a normal mood and affect.  Nursing note and vitals reviewed.    ED Treatments / Results  Labs (all labs ordered are listed, but only abnormal results are displayed) Labs Reviewed  COMPREHENSIVE METABOLIC PANEL - Abnormal; Notable for the following components:       Result Value   Potassium >7.5 (*)    Glucose, Bld 475 (*)    BUN 47 (*)    Creatinine, Ser 2.21 (*)    GFR calc non Af Amer 30 (*)    GFR calc Af Amer 34 (*)    All other components within normal limits  I-STAT CHEM 8, ED - Abnormal; Notable for the following components:   Potassium 8.5 (*)    BUN 74 (*)    Creatinine, Ser 2.10 (*)    Glucose, Bld 453 (*)    Hemoglobin 17.3 (*)    All other components within normal limits  CBC WITH DIFFERENTIAL/PLATELET  LIPASE, BLOOD  URINALYSIS, ROUTINE W REFLEX MICROSCOPIC  CBG MONITORING, ED   ECG REPORT   Date: 09/02/2017 1:36pm  Rate: 118  Rhythm: sinus tachycardia  QRS Axis: left  Intervals: normal  ST/T Wave abnormalities: nonspecific T wave changes, slightly peaked t-waves  Conduction Disutrbances:none  Narrative Interpretation:   Old EKG Reviewed: none available  I have personally reviewed the EKG tracing and agree with the computerized printout as noted.   ED ECG REPORT 2:33pm   Date: 09/02/2017  Rate: 109  Rhythm: sinus tachycardia  QRS Axis: left  Intervals: normal  ST/T Wave abnormalities: normal  Conduction Disutrbances:left anterior fascicular block  Narrative Interpretation:   Old EKG Reviewed: unchanged  I have personally reviewed the EKG tracing and agree with the computerized printout as noted.  ED ECG REPORT 6:16pm   Date: 09/02/2017  Rate:105  Rhythm: sinus tachycardia  QRS Axis: left  Intervals: normal  ST/T Wave abnormalities: nonspecific ST/T changes  Conduction Disutrbances:none  Narrative Interpretation: improvement in t-waves  Old EKG Reviewed: changes noted  I have personally reviewed the EKG tracing and agree with the computerized printout as noted.    Radiology No results found.  Procedures Procedures (including critical care time)  Medications Ordered in ED Medications  albuterol (PROVENTIL) (2.5 MG/3ML) 0.083% nebulizer solution 10 mg (has no administration in time range)   sodium chloride 0.9 % bolus 500 mL (has no administration in time range)  albuterol (PROVENTIL) (2.5 MG/3ML) 0.083% nebulizer solution 10 mg (has no administration in time range)  insulin aspart (novoLOG) injection 10 Units (has no administration in time range)  sodium bicarbonate injection 50 mEq (has no administration in time range)  calcium gluconate inj 10% (1 g) URGENT USE ONLY! (1 g Intravenous Given 09/02/17 1423)  albuterol (PROVENTIL) (2.5 MG/3ML) 0.083% nebulizer solution 10 mg (10 mg Nebulization Given 09/02/17 1411)  insulin aspart (novoLOG) injection 10 Units (10 Units Intravenous Given 09/02/17 1429)  sodium chloride 0.9 % bolus 1,000 mL (0 mLs Intravenous Stopped 09/02/17 1545)  insulin aspart (novoLOG) injection 10 Units (10 Units Intravenous Given 09/02/17 1925)  sodium bicarbonate injection 50 mEq (50 mEq Intravenous Given 09/02/17 1844)  sodium polystyrene (KAYEXALATE) 15 GM/60ML suspension 30 g (30 g Oral Given 09/02/17 1843)  albuterol (PROVENTIL, VENTOLIN) (5 MG/ML) 0.5% continuous inhalation solution (  Given 09/02/17 2048)     Initial Impression / Assessment and Plan / ED Course  I have reviewed the triage vital signs and the nursing notes.  Pertinent labs & imaging results that were available during my care of the patient were reviewed by me and considered in my medical decision making (see chart for details).     Patient seen and examined. Work-up initiated. Medications ordered.  EKG from urgent care reviewed, there is hinting of peaked T waves.  D/w Dr. Sedonia Small.  Patient also noted to be hyperglycemic with normal anion gap at urgent care.  Fluids, insulin, albuterol, calcium ordered.  Vital signs reviewed and are as follows: BP 124/87   Pulse (!) 114   Temp 98 F (36.7 C) (Axillary)   Resp 15   Ht 5\' 4"  (1.626 m)   Wt 81.6 kg   SpO2 100%   BMI 30.90 kg/m   5:15 PM Blood hemolyzed previously. Re-drawn. Continuing to await results.   6:22 PM Blood hemolyzed again  causing additional delay. K is still very high. Repeat EKG, additional insulin (sugar still 400's), albuterol, add bicarb and kayexalate.   Repeat EKG reviewed. Appears improved from previous.   Pending callback from hospitalist as well as nephrology.   7:28 PM Still awaiting callbacks.   7:57 PM Spoke with Dr. Roel Cluck who will see.   BP 106/87   Pulse (!) 119   Temp 98 F (36.7 C) (Axillary)   Resp 12   Ht 5\' 4"  (1.626 m)   Wt 81.6 kg   SpO2 99%   BMI 30.90 kg/m   9:27 PM Multiple pages placed to nephrology. No callback.   CRITICAL CARE Performed by: Carlisle Cater PA-C Total critical care time: 55 minutes Critical care time was exclusive of separately billable procedures and treating other patients. Critical care was necessary to treat or prevent imminent or life-threatening deterioration. Critical care was time spent personally by me on the following activities: development of treatment plan with patient and/or surrogate as well as nursing, discussions with consultants, evaluation of patient's response to treatment, examination of patient, obtaining history from patient or surrogate, ordering and performing treatments and interventions, ordering and review of laboratory studies, ordering and review of radiographic studies, pulse oximetry and re-evaluation of patient's condition.   Final Clinical Impressions(s) / ED Diagnoses   Final diagnoses:  Hyperkalemia  Acute renal failure, unspecified acute renal failure type (HCC)  Weakness  Hyperglycemia without ketosis   Admit.   ED Discharge Orders    None       Carlisle Cater, Hershal Coria 09/02/17 2128    Maudie Flakes, MD 09/02/17 2207

## 2017-09-02 NOTE — H&P (Signed)
Cameron Glover RDE:[id number] DOB: 09/07/1950 DOA: 09/02/2017     PCP: Patient, No Pcp Per   Outpatient Specialists:   NONE    Patient arrived to ER on 09/02/17 at 1354  Patient coming from: home Lives  With family sister but now passed away.    Chief Complaint:  Chief Complaint  Patient presents with  . Abnormal Lab    HPI: Cameron Glover is a 67 y.o. male with medical history significant of HTN and DM    Presented with   nausea vomiting for the past 3 to 4 days this is associated with abdominal pain. He atributes that to eating [place] chicken,   Try to use aspirin and Pepto-Bismol but did not seem to help nothing seems to alleviate his symptoms.  He did get worse with eating.  He has been having some associated diarrhea.  Have had some chills decreased appetite.  No fever though no chest pain or shortness of breath no blood in stool or melena.  No urinary complaints. Recently moved from [place] cannot establish primary care provider in [place] yet.  Pt had a sister with ESRD  who he used to reside and take care of she has passed away 1 month ago  with intracranial bleeding. Since then Patient has moved to [place] to be closer to the rest of his family.   Patient Went to urgent care noted to have BG in 600 creatinine above 2 , EKG with peaked T waves  K in 8.4 he was asked to present to emergency department  Regarding pertinent Chronic problems: Patient is on lisinopril for history of hypertension and metformin for diabetes No hx of CKD   While in ER: Repeat ECG improved T waves Noted to have AKI Cr 2.4 He was treated with IV fluids insulin x2 Kayexalate, calcium Repeat potassium down to 7.8 on repeat went up to  8.5  The following Work up has been ordered so far:  Orders Placed This Encounter  Procedures  . CBC with Differential/Platelet  . Urinalysis, Routine w reflex microscopic  . Comprehensive metabolic panel  . Lipase, blood  . Place Patient on a Cardiac  Monitor  . Initiate Carrier Fluid Protocol  . Consult for [facility] Admission  . Consult to nephrology  . I-stat chem 8, ed  . ED EKG  . EKG 12-Lead  . ED EKG  . EKG 12-Lead    Following Medications were ordered in ER: Medications  albuterol (PROVENTIL) (2.5 MG/3ML) 0.083% nebulizer solution 10 mg (has no administration in time range)  calcium gluconate inj 10% (1 g) URGENT USE ONLY! (1 g Intravenous Given 09/02/17 1423)  albuterol (PROVENTIL) (2.5 MG/3ML) 0.083% nebulizer solution 10 mg (10 mg Nebulization Given 09/02/17 1411)  insulin aspart (novoLOG) injection 10 Units (10 Units Intravenous Given 09/02/17 1429)  sodium chloride 0.9 % bolus 1,000 mL (0 mLs Intravenous Stopped 09/02/17 1545)  insulin aspart (novoLOG) injection 10 Units (10 Units Intravenous Given 09/02/17 1925)  sodium bicarbonate injection 50 mEq (50 mEq Intravenous Given 09/02/17 1844)  sodium polystyrene (KAYEXALATE) 15 GM/60ML suspension 30 g (30 g Oral Given 09/02/17 1843)    Significant initial  Findings: Abnormal Labs Reviewed  COMPREHENSIVE METABOLIC PANEL - Abnormal; Notable for the following components:      Result Value   Potassium >7.5 (*)    Glucose, Bld 475 (*)    BUN 47 (*)    Creatinine, Ser 2.21 (*)    GFR calc non Af Amer 30 (*)  GFR calc Af Amer 34 (*)    All other components within normal limits  I-STAT CHEM 8, ED - Abnormal; Notable for the following components:   Potassium 8.5 (*)    BUN 74 (*)    Creatinine, Ser 2.10 (*)    Glucose, Bld 453 (*)    Hemoglobin 17.3 (*)    All other components within normal limits   Blood sugar now down to 417  Na 137 K 8.5  Cr     Up from baseline see below Lab Results  Component Value Date   CREATININE 2.10 (H) 09/02/2017   CREATININE 2.21 (H) 09/02/2017   CREATININE 2.31 (H) 09/02/2017      WBC  7.5  HG/HCT Up from baseline see below    Component Value Date/Time   HGB 17.3 (H) 09/02/2017 1806   HCT 51.0 09/02/2017 1806     Lipase 42  Troponin (Point of Care Test)   Lactic Acid, Venous No results found for: LATICACIDVEN    UA   no evidence of UTI       ECG:  Personally reviewed by me showing: HR : 118 Rhythm: sinus tachycardia  no evidence of ischemic changes, elevated t waves QTC 434     ED Triage Vitals  Enc Vitals Group     BP 09/02/17 1406 124/87     Pulse Rate 09/02/17 1406 (!) 114     Resp 09/02/17 1406 15     Temp 09/02/17 1406 98 F (36.7 C)     Temp Source 09/02/17 1406 Axillary     SpO2 09/02/17 1406 100 %     Weight 09/02/17 1401 180 lb (81.6 kg)     Height 09/02/17 1401 5\' 4"  (1.626 m)     Head Circumference --      Peak Flow --      Pain Score 09/02/17 1401 8     Pain Loc --      Pain Edu? --      Excl. in [place]? --   TMAX(24)@       Latest  Blood pressure 106/87, pulse (!) 106, temperature 98 F (36.7 C), temperature source Axillary, resp. rate 14, height 5\' 4"  (1.626 m), weight 81.6 kg, SpO2 100 %.     ER Provider Called:  Nephrology     Dr. Johnney Ou They Recommend aggressive IV fluid rehydration given Lasix hold ACE obtain renal ultrasound stable and bladder scan Will see   in ER  Hospitalist was called for admission for severe hyperkalemia and AKI in uncontrolled  DM 2    Review of Systems:    Pertinent positives include: chills,  Constitutional:  No weight loss, night sweats, Fevers,  fatigue, weight loss  HEENT:  No headaches, Difficulty swallowing,Tooth/dental problems,Sore throat,  No sneezing, itching, ear ache, nasal congestion, post nasal drip,  Cardio-vascular:  No chest pain, Orthopnea, PND, anasarca, dizziness, palpitations.no Bilateral lower extremity swelling  GI:  No heartburn, indigestion, abdominal pain, nausea, vomiting, diarrhea, change in bowel habits, loss of appetite, melena, blood in stool, hematemesis Resp:  no shortness of breath at rest. No dyspnea on exertion, No excess mucus, no productive cough, No non-productive cough, No  coughing up of blood.No change in color of mucus.No wheezing. Skin:  no rash or lesions. No jaundice GU:  no dysuria, change in color of urine, no urgency or frequency. No straining to urinate.  No flank pain.  Musculoskeletal:  No joint pain or no joint swelling. No decreased range  of motion. No back pain.  Psych:  No change in mood or affect. No depression or anxiety. No memory loss.  Neuro: no localizing neurological complaints, no tingling, no weakness, no double vision, no gait abnormality, no slurred speech, no confusion  All systems reviewed and apart from [place] all are negative  Past Medical History:   Past Medical History:  Diagnosis Date  . Diabetes mellitus without complication ([place])   . Hypertension       History reviewed. No pertinent surgical history.  Social History:  Ambulatory   Independently     reports that he has never smoked. He has never used smokeless tobacco. He reports that he drank alcohol. He reports that he does not use drugs.     Family History:   Family History  Problem Relation Age of Onset  . Kidney failure Sister   . Cancer Brother   . CAD Brother   . Diabetes Brother   . Diabetes Other     Allergies: No Known Allergies   Prior to Admission medications   Medication Sig Start Date End Date Taking? Authorizing Provider  atorvastatin (LIPITOR) 40 MG tablet Take 40 mg by mouth daily.   Yes [provider]  lisinopril (PRINIVIL,ZESTRIL) 10 MG tablet Take 10 mg by mouth daily.   Yes [provider]  metFORMIN (GLUCOPHAGE) 1000 MG tablet Take 1,000 mg by mouth 2 (two) times daily with a meal.   Yes [provider]   Physical Exam: Blood pressure 106/87, pulse (!) 106, temperature 98 F (36.7 C), temperature source Axillary, resp. rate 14, height 5\' 4"  (1.626 m), weight 81.6 kg, SpO2 100 %. 1. General:  in No Acute distress  well  -appearing 2. Psychological: Alert and   Oriented 3. Head/ENT:    Dry Mucous  Membranes                          Head Non traumatic, neck supple                          Poor Dentition 4. SKIN:  decreased Skin turgor,  Skin clean Dry and intact no rash 5. Heart: Regular rate and rhythm no Murmur, no Rub or gallop 6. Lungs:  no wheezes or crackles   7. Abdomen: Soft,  non-tender, Non distended   bowel sounds present 8. Lower extremities: no clubbing, cyanosis, or  edema 9. Neurologically Grossly intact, moving all 4 extremities equally   10. MSK: Normal range of motion   LABS:     Recent Labs  Lab 09/02/17 1315 09/02/17 1421 09/02/17 1806  WBC  --  7.5  --   NEUTROABS  --  4.3  --   HGB 18.7* 16.9 17.3*  HCT 55.0* 50.1 51.0  MCV  --  92.3  --   PLT  --  364  --    Basic Metabolic Panel: Recent Labs  Lab 09/02/17 1315 09/02/17 1320 09/02/17 1738 09/02/17 1806  NA 132* 132* 138 137  K 7.8* >7.5* >7.5* 8.5*  CL 101 101 107 109  CO2  --  17* 24  --   GLUCOSE 681* 711* 475* 453*  BUN 54* 49* 47* 74*  CREATININE 2.10* 2.31* 2.21* 2.10*  CALCIUM  --  10.1 9.7  --       Recent Labs  Lab 09/02/17 1320 09/02/17 1738  AST 58* 34  ALT 50* 42  ALKPHOS  90 77  BILITOT 0.8 0.8  PROT 8.1 7.3  ALBUMIN 4.5 4.1   Recent Labs  Lab 09/02/17 1738  LIPASE 42   No results for input(s): AMMONIA in the last 168 hours.    HbA1C: No results for input(s): HGBA1C in the last 72 hours. CBG: No results for input(s): GLUCAP in the last 168 hours.    Urine analysis: No results found for: COLORURINE, APPEARANCEUR, LABSPEC, PHURINE, GLUCOSEU, HGBUR, BILIRUBINUR, KETONESUR, PROTEINUR, UROBILINOGEN, NITRITE, LEUKOCYTESUR     Cultures: No results found for: [place], [place], [place], REPTSTATUS   Radiological Exams on Admission: No results found.  Chart has been reviewed    Assessment/Plan  67 y.o. male with medical history significant of HTN and DM     Admitted for severe hyperkalemia and AKI in uncontrolled  DM 2   Present on Admission: .  Hyperkalemia -treated aggressively with insulin bolus x2, calcium IV gluconate x2 bicarb, Kayexalate aggressive IV fluids, Lasix IV currently down to 5.1 hold ACE inhibitor continue to monitor appreciate nephrology input . DM (diabetes mellitus) with complications ([place]) -order sliding scale and continue to monitor check hemoglobin A1c poorly controlled will order diabetic care coordinator consult . AKI (acute kidney injury) ([place]) rehydrate in order urine electrolytes check renal ultrasound appreciate nephrology consult . Diarrhea -check gastric panel possibly secondary to gastroenteritis . Dehydration we will rehydrate with aggressive IV fluids check orthostatics   Other plan as per orders.  DVT prophylaxis:  SCD   Code Status:  FULL CODE   as per patient    I had personally discussed CODE STATUS with patient   Family Communication:   Family not  at  Bedside    Disposition Plan:   To home once workup is complete and patient is stable                                       Consults called: nephrology  Admission status:   Admitted as an inpatient given severity of hyperkalemia despite aggressive management was not improving initially and expectation patient may require hemodialysis, continue to monitor kidney status with aggressive IV fluid rehydration      Level of care     SDU        Khloei Spiker 09/03/2017, 12:58 AM    [facility]  Pager [phone]   after 2 AM please page floor coverage PA If 7AM-7PM, please contact the day team taking care of the patient  [email or web address]  Password [email or web address]

## 2017-09-02 NOTE — ED Notes (Signed)
Pt taken to ER by this RN

## 2017-09-02 NOTE — ED Notes (Signed)
EDP notified of K+ level.

## 2017-09-02 NOTE — ED Notes (Signed)
Couldn't get standing vitals because pt legs were weak and he wasn't able to hold his self up

## 2017-09-02 NOTE — ED Triage Notes (Signed)
Pt reports 8/10 RLQ abdominal pain x4-5 days. Pt endorses diarrhea 1/2 times a day for 4-5 days. Per Urgent care pt potassium 8.5. Pt alert and oriented.

## 2017-09-03 ENCOUNTER — Other Ambulatory Visit: Payer: Self-pay

## 2017-09-03 ENCOUNTER — Inpatient Hospital Stay (HOSPITAL_COMMUNITY): Payer: Medicare Other

## 2017-09-03 DIAGNOSIS — R739 Hyperglycemia, unspecified: Secondary | ICD-10-CM

## 2017-09-03 DIAGNOSIS — N179 Acute kidney failure, unspecified: Secondary | ICD-10-CM

## 2017-09-03 LAB — HEMOGLOBIN A1C
Hgb A1c MFr Bld: 13.6 % — ABNORMAL HIGH (ref 4.8–5.6)
Mean Plasma Glucose: 343.62 mg/dL

## 2017-09-03 LAB — GASTROINTESTINAL PANEL BY PCR, STOOL (REPLACES STOOL CULTURE)

## 2017-09-03 LAB — GLUCOSE, CAPILLARY
GLUCOSE-CAPILLARY: 200 mg/dL — AB (ref 70–99)
GLUCOSE-CAPILLARY: 253 mg/dL — AB (ref 70–99)
Glucose-Capillary: 272 mg/dL — ABNORMAL HIGH (ref 70–99)
Glucose-Capillary: 272 mg/dL — ABNORMAL HIGH (ref 70–99)
Glucose-Capillary: 235 mg/dL — ABNORMAL HIGH (ref 70–99)

## 2017-09-03 LAB — URIC ACID: Uric Acid, Serum: 7 mg/dL (ref 3.7–8.6)

## 2017-09-03 LAB — LACTATE DEHYDROGENASE: LDH: 122 U/L (ref 98–192)

## 2017-09-03 LAB — BASIC METABOLIC PANEL
ANION GAP: 8 (ref 5–15)
BUN: 36 mg/dL — ABNORMAL HIGH (ref 8–23)
CO2: 26 mmol/L (ref 22–32)
Calcium: 8.5 mg/dL — ABNORMAL LOW (ref 8.9–10.3)
Chloride: 111 mmol/L (ref 98–111)
Creatinine, Ser: 1.94 mg/dL — ABNORMAL HIGH (ref 0.61–1.24)
GFR calc Af Amer: 40 mL/min — ABNORMAL LOW (ref >60)
GFR, EST NON AFRICAN AMERICAN: 35 mL/min — AB (ref >60)
GLUCOSE: 244 mg/dL — AB (ref 70–99)
POTASSIUM: 4.3 mmol/L (ref 3.5–5.1)
SODIUM: 145 mmol/L (ref 135–145)

## 2017-09-03 LAB — MAGNESIUM: MAGNESIUM: 2.8 mg/dL — AB (ref 1.7–2.4)

## 2017-09-03 LAB — TROPONIN I

## 2017-09-03 LAB — MRSA PCR SCREENING: MRSA by PCR: NEGATIVE

## 2017-09-03 LAB — NA AND K (SODIUM & POTASSIUM), RAND UR
POTASSIUM UR: 49 mmol/L
SODIUM UR: 68 mmol/L

## 2017-09-03 LAB — POTASSIUM
Potassium: 4.6 mmol/L (ref 3.5–5.1)
Potassium: 4.5 mmol/L (ref 3.5–5.1)
Potassium: 4.4 mmol/L (ref 3.5–5.1)

## 2017-09-03 LAB — PHOSPHORUS: PHOSPHORUS: 5.7 mg/dL — AB (ref 2.5–4.6)

## 2017-09-03 LAB — CK: Total CK: 186 U/L (ref 49–397)

## 2017-09-03 LAB — HIV ANTIBODY (ROUTINE TESTING W REFLEX): HIV Screen 4th Generation wRfx: NONREACTIVE

## 2017-09-03 MED ORDER — INSULIN ASPART 100 UNIT/ML ~~LOC~~ SOLN
0.0000 [IU] | Freq: Every day | SUBCUTANEOUS | Status: DC
  Administered 2017-09-03: 2 [IU] via SUBCUTANEOUS

## 2017-09-03 MED ORDER — INSULIN ASPART 100 UNIT/ML ~~LOC~~ SOLN
0.0000 [IU] | SUBCUTANEOUS | Status: DC
  Administered 2017-09-03: 3 [IU] via SUBCUTANEOUS
  Administered 2017-09-03: 2 [IU] via SUBCUTANEOUS
  Administered 2017-09-03 (×2): 5 [IU] via SUBCUTANEOUS

## 2017-09-03 MED ORDER — SODIUM CHLORIDE 0.9 % IV SOLN
INTRAVENOUS | Status: DC
  Administered 2017-09-04: 04:00:00 via INTRAVENOUS

## 2017-09-03 MED ORDER — CHLORHEXIDINE GLUCONATE CLOTH 2 % EX PADS
6.0000 | MEDICATED_PAD | Freq: Every day | CUTANEOUS | Status: DC
  Administered 2017-09-03 – 2017-09-04 (×2): 6 via TOPICAL

## 2017-09-03 MED ORDER — INSULIN ASPART 100 UNIT/ML ~~LOC~~ SOLN
0.0000 [IU] | Freq: Three times a day (TID) | SUBCUTANEOUS | Status: DC
  Administered 2017-09-04: 3 [IU] via SUBCUTANEOUS
  Administered 2017-09-04: 1 [IU] via SUBCUTANEOUS

## 2017-09-03 MED ORDER — INSULIN GLARGINE 100 UNIT/ML ~~LOC~~ SOLN
10.0000 [IU] | Freq: Every day | SUBCUTANEOUS | Status: DC
  Administered 2017-09-03: 10 [IU] via SUBCUTANEOUS
  Filled 2017-09-03: qty 0.1

## 2017-09-03 NOTE — Progress Notes (Signed)
[facility] Progress Note   Subjective:  Feels a bit better this morning. Strength returning.  Mild dyspnea, orthopnea.     Objective Vitals:   09/03/17 0004 09/03/17 0015 09/03/17 0307 09/03/17 0848  BP:  104/76 104/61   Pulse:  (!) 115 (!) 101   Resp:  19 15   Temp:  97.7 F (36.5 C) 97.7 F (36.5 C) (!) 97.5 F (36.4 C)  TempSrc:  Oral Oral Oral  SpO2:  97% 96%   Weight: 74.1 kg     Height: 5\' 4"  (1.626 m)      Physical Exam General: comfortable at 45 degrees in bed Heart: RRR Lungs: dec BS bases, no rales or wheezes, normal WOB on RA Abdomen: soft, nontender Extremities: no edema Neuro: nonfocal  Additional Objective Labs: Basic Metabolic Panel: Recent Labs  Lab 09/02/17 1738 09/02/17 1806 09/02/17 2236 09/03/17 0455 09/03/17 0852  NA 138 137 144  --  145  K >7.5* 8.5* 5.1 4.6 4.3  CL 107 109 108  --  111  CO2 24  --  25  --  26  GLUCOSE 475* 453* 414*  --  244*  BUN 47* 74* 44*  --  36*  CREATININE 2.21* 2.10* 2.19*  --  1.94*  CALCIUM 9.7  --  9.6  --  8.5*  PHOS  --   --  5.7*  --   --    Liver Function Tests: Recent Labs  Lab 09/02/17 1320 09/02/17 1738  AST 58* 34  ALT 50* 42  ALKPHOS 90 77  BILITOT 0.8 0.8  PROT 8.1 7.3  ALBUMIN 4.5 4.1   Recent Labs  Lab 09/02/17 1738  LIPASE 42   CBC: Recent Labs  Lab 09/02/17 1315 09/02/17 1421 09/02/17 1806  WBC  --  7.5  --   NEUTROABS  --  4.3  --   HGB 18.7* 16.9 17.3*  HCT 55.0* 50.1 51.0  MCV  --  92.3  --   PLT  --  364  --    Blood Culture No results found for: SDES, SPECREQUEST, CULT, REPTSTATUS  Cardiac Enzymes: Recent Labs  Lab 09/02/17 2236  CKTOTAL 186  TROPONINI <0.03   CBG: Recent Labs  Lab 09/02/17 2000 09/02/17 2243 09/03/17 0359 09/03/17 0846  GLUCAP 417* 372* 272* 200*   Iron Studies: No results for input(s): IRON, TIBC, TRANSFERRIN, FERRITIN in the last 72 hours. @lablastinr3 @ Studies/Results: US Renal  Result Date:  09/03/2017 CLINICAL DATA:  Patient with acute renal failure. EXAM: RENAL / URINARY TRACT ULTRASOUND COMPLETE COMPARISON:  None. FINDINGS: Right Kidney: Length: 9.9 cm. Echogenicity within normal limits. No mass or hydronephrosis visualized. Left Kidney: Length: 11.5 cm. Echogenicity within normal limits. No mass or hydronephrosis visualized. Bladder: Mild wall thickening of the urinary bladder.  Prostate is enlarged. Echogenic hepatic parenchyma. IMPRESSION: No hydronephrosis. Mild wall thickening of the urinary bladder may be secondary to outlet obstruction. Recommend correlation with urinalysis to exclude infection. Hepatic steatosis. Electronically Signed   By: Lovey Newcomer M.D.   On: 09/03/2017 01:59   Medications: . sodium chloride 150 mL/hr at 09/03/17 0916   . albuterol  10 mg Nebulization Once  . albuterol  10 mg Nebulization Once  . Chlorhexidine Gluconate Cloth  6 each Topical Q0600  . insulin aspart  0-9 Units Subcutaneous Q4H  . pneumococcal 23 valent vaccine  0.5 mL Intramuscular Tomorrow-1000  . sodium zirconium cyclosilicate  10 g Oral Daily    Assessment/Plan:  1.  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  Has normalized now with numerous interventions.  Still having some GI symptoms so will continue NS currently but in light of dyspnea will check CXR to ensure not developing pulmonary edema.  Serial Ks to ensure staying normal.  Check BMP in AM.  2.  AKI +/-CKD:  Suspect he has CKD secondary to uncontrolled DM (dx 9 years ago, A1c almost 14 on admission).  Renal shows no hydronephrosis but bladder thickening possibly due to BOO.  He denies voiding difficulties and no UTI on UA.   Discussed control of DM as key factor to delaying progression of CKD (sister had ESRD). Strict I/Os, IVF per above.   3.  GI symptoms: GI panel  pending.    4.  HTN:  Currently hypo to normotensive.  Hold antiHTN therapy.    Jannifer Hick MD [facility] Pager [phone]

## 2017-09-03 NOTE — Progress Notes (Signed)
[place] TEAM 1 - Stepdown/ICU TEAM  Cameron Glover  GBT:[id number] DOB: [birth year] DOA: 09/02/2017 PCP: Patient, No Pcp Per    Brief Narrative:  67 y.o. male with a hx of HTN and DM who presented to a local urgent care w/ nausea & vomiting for 3-4 days, abdominal pain, and diarrhea.  He was found to have  CBG of 600 and creatinine above 2.  EKG revealed peaked T waves and his K+ was 8.4.  He was sent to the ED.    Subjective: Resting comfortably in bed.  Denies chest pain shortness breath fevers chills nausea or vomiting.  Tells me he feels much better overall.    Assessment & Plan:  Acute kidney injury Nephrology following - due to pre-renal azotemia in setting of diarrhea + ACEi   Recent Labs  Lab 09/02/17 1320 09/02/17 1738 09/02/17 1806 09/02/17 2236 09/03/17 0852  CREATININE 2.31* 2.21* 2.10* 2.19* 1.94*    Hyperkalemia  Corrected w/ diuretic and lokelma - due to AKI - follow   Uncontrolled DM CBG remains elevated - adjust tx and follow - A1c 13.6 - on metformin alone as an outpatient   Diarrhea - N/V GI pathogen panel unrevealing - sx appear to have spontaneously resolved - ?food poisoning   Hepatic steatosis Incidentally noted on renal US - LFTs normal - will need outpt monitoring   DVT prophylaxis: SCDs Code Status: FULL CODE Family Communication: spoke w/ family at bedside  Disposition Plan: stable for tele bed - ambulate - home when K+ and renal fxn stable w/ good intake   Consultants:  Nephrology   Antimicrobials:  none   Objective: Blood pressure 108/76, pulse 75, temperature 97.6 F (36.4 C), temperature source Oral, resp. rate 15, height 5\' 4"  (1.626 m), weight 74.1 kg, SpO2 100 %.  Intake/Output Summary (Last 24 hours) at 09/03/2017 1646 Last data filed at 09/03/2017 1500 Gross per 24 hour  Intake 2644.27 ml  Output 700 ml  Net 1944.27 ml   Filed Weights   09/02/17 1401 09/03/17 0004  Weight: 81.6 kg 74.1 kg    Examination: General: No  acute respiratory distress Lungs: Clear to auscultation bilaterally without wheezes or crackles Cardiovascular: Regular rate and rhythm without murmur gallop or rub normal S1 and S2 Abdomen: Nontender, nondistended, soft, bowel sounds positive, no rebound, no ascites, no appreciable mass Extremities: No significant cyanosis, clubbing, or edema bilateral lower extremities  CBC: Recent Labs  Lab 09/02/17 1315 09/02/17 1421 09/02/17 1806  WBC  --  7.5  --   NEUTROABS  --  4.3  --   HGB 18.7* 16.9 17.3*  HCT 55.0* 50.1 51.0  MCV  --  92.3  --   PLT  --  364  --    Basic Metabolic Panel: Recent Labs  Lab 09/02/17 1738 09/02/17 1806 09/02/17 2236 09/03/17 0455 09/03/17 0852 09/03/17 1156  NA 138 137 144  --  145  --   K >7.5* 8.5* 5.1 4.6 4.3 4.5  CL 107 109 108  --  111  --   CO2 24  --  25  --  26  --   GLUCOSE 475* 453* 414*  --  244*  --   BUN 47* 74* 44*  --  36*  --   CREATININE 2.21* 2.10* 2.19*  --  1.94*  --   CALCIUM 9.7  --  9.6  --  8.5*  --   MG  --   --  2.8*  --   --   --  PHOS  --   --  5.7*  --   --   --    GFR: Estimated Creatinine Clearance: 35.5 mL/min (A) (by C-G formula based on SCr of 1.94 mg/dL (H)).  Liver Function Tests: Recent Labs  Lab 09/02/17 1320 09/02/17 1738  AST 58* 34  ALT 50* 42  ALKPHOS 90 77  BILITOT 0.8 0.8  PROT 8.1 7.3  ALBUMIN 4.5 4.1   Recent Labs  Lab 09/02/17 1738  LIPASE 42    Cardiac Enzymes: Recent Labs  Lab 09/02/17 2236  CKTOTAL 186  TROPONINI <0.03    HbA1C: Hgb A1c MFr Bld  Date/Time Value Ref Range Status  09/03/2017 04:55 AM 13.6 (H) 4.8 - 5.6 % Final    Comment:    (NOTE) Pre diabetes:          5.7%-6.4% Diabetes:              >6.4% Glycemic control for   <7.0% adults with diabetes     CBG: Recent Labs  Lab 09/02/17 2000 09/02/17 2243 09/03/17 0359 09/03/17 0846 09/03/17 1211  GLUCAP 417* 372* 272* 200* 272*    Recent Results (from the past 240 hour(s))  Gastrointestinal  Panel by PCR , Stool     Status: None   Collection Time: 09/03/17 12:45 AM  Result Value Ref Range Status   Campylobacter species NOT DETECTED NOT DETECTED Final   Plesimonas shigelloides NOT DETECTED NOT DETECTED Final   Salmonella species NOT DETECTED NOT DETECTED Final   Yersinia enterocolitica NOT DETECTED NOT DETECTED Final   Vibrio species NOT DETECTED NOT DETECTED Final   Vibrio cholerae NOT DETECTED NOT DETECTED Final   Enteroaggregative E coli (EAEC) NOT DETECTED NOT DETECTED Final   Enteropathogenic E coli (EPEC) NOT DETECTED NOT DETECTED Final   Enterotoxigenic E coli (ETEC) NOT DETECTED NOT DETECTED Final   Shiga like toxin producing E coli (STEC) NOT DETECTED NOT DETECTED Final   Shigella/Enteroinvasive E coli (EIEC) NOT DETECTED NOT DETECTED Final   Cryptosporidium NOT DETECTED NOT DETECTED Final   Cyclospora cayetanensis NOT DETECTED NOT DETECTED Final   Entamoeba histolytica NOT DETECTED NOT DETECTED Final   Giardia lamblia NOT DETECTED NOT DETECTED Final   Adenovirus F40/41 NOT DETECTED NOT DETECTED Final   Astrovirus NOT DETECTED NOT DETECTED Final   Norovirus GI/GII NOT DETECTED NOT DETECTED Final   Rotavirus A NOT DETECTED NOT DETECTED Final   Sapovirus (I, II, IV, and V) NOT DETECTED NOT DETECTED Final    Comment: Performed at [facility], [address] [place], [place] [address]  MRSA PCR Screening     Status: None   Collection Time: 09/03/17 12:59 AM  Result Value Ref Range Status   MRSA by PCR NEGATIVE NEGATIVE Final    Comment:        The GeneXpert MRSA Assay (FDA approved for NASAL specimens only), is one component of a comprehensive MRSA colonization surveillance program. It is not intended to diagnose MRSA infection nor to guide or monitor treatment for MRSA infections. Performed at [facility], [place] [address] [place], [place] [address]      Scheduled Meds: . albuterol  10 mg Nebulization Once  . albuterol  10 mg  Nebulization Once  . Chlorhexidine Gluconate Cloth  6 each Topical Q0600  . insulin aspart  0-9 Units Subcutaneous Q4H   Continuous Infusions: . sodium chloride 150 mL/hr at 09/03/17 0916     LOS: 1 day   Dellis Filbert T.  Thereasa Solo, MD [facility] Office  [phone] Pager - Text Page per Amion  If 7PM-7AM, please contact night-coverage per Amion 09/03/2017, 4:46 PM

## 2017-09-04 DIAGNOSIS — E875 Hyperkalemia: Principal | ICD-10-CM

## 2017-09-04 DIAGNOSIS — E860 Dehydration: Secondary | ICD-10-CM

## 2017-09-04 DIAGNOSIS — E118 Type 2 diabetes mellitus with unspecified complications: Secondary | ICD-10-CM

## 2017-09-04 DIAGNOSIS — N179 Acute kidney failure, unspecified: Secondary | ICD-10-CM

## 2017-09-04 LAB — BASIC METABOLIC PANEL
Anion gap: 6 (ref 5–15)
BUN: 28 mg/dL — AB (ref 8–23)
CHLORIDE: 110 mmol/L (ref 98–111)
CO2: 23 mmol/L (ref 22–32)
CREATININE: 1.43 mg/dL — AB (ref 0.61–1.24)
Calcium: 7.8 mg/dL — ABNORMAL LOW (ref 8.9–10.3)
GFR calc Af Amer: 58 mL/min — ABNORMAL LOW (ref >60)
GFR calc non Af Amer: 50 mL/min — ABNORMAL LOW (ref >60)
GLUCOSE: 152 mg/dL — AB (ref 70–99)
POTASSIUM: 3.8 mmol/L (ref 3.5–5.1)
Sodium: 139 mmol/L (ref 135–145)

## 2017-09-04 LAB — CBC
HEMATOCRIT: 34.9 % — AB (ref 39.0–52.0)
Hemoglobin: 11.3 g/dL — ABNORMAL LOW (ref 13.0–17.0)
MCH: 30.9 pg (ref 26.0–34.0)
MCHC: 32.4 g/dL (ref 30.0–36.0)
MCV: 95.4 fL (ref 78.0–100.0)
PLATELETS: 150 10*3/uL (ref 150–400)
RBC: 3.66 MIL/uL — ABNORMAL LOW (ref 4.22–5.81)
RDW: 11.5 % (ref 11.5–15.5)
WBC: 5.2 10*3/uL (ref 4.0–10.5)

## 2017-09-04 LAB — GLUCOSE, CAPILLARY
Glucose-Capillary: 143 mg/dL — ABNORMAL HIGH (ref 70–99)
Glucose-Capillary: 218 mg/dL — ABNORMAL HIGH (ref 70–99)

## 2017-09-04 MED ORDER — INSULIN DETEMIR 100 UNIT/ML ~~LOC~~ SOLN: 12.0000 [IU] | Freq: Two times a day (BID) | SUBCUTANEOUS | 11 refills | Status: DC

## 2017-09-04 MED ORDER — GLIPIZIDE 5 MG PO TABS: 2.5000 mg | ORAL_TABLET | Freq: Two times a day (BID) | ORAL | 11 refills | Status: DC

## 2017-09-04 MED ORDER — BLOOD GLUCOSE MONITOR KIT: PACK | 0 refills | Status: DC

## 2017-09-04 MED ORDER — INSULIN GLARGINE 100 UNIT/ML ~~LOC~~ SOLN: 25.0000 [IU] | Freq: Every day | SUBCUTANEOUS | 11 refills | Status: DC

## 2017-09-04 NOTE — Discharge Summary (Signed)
Physician Discharge Summary  Cameron Glover:[id number] DOB: Oct 08, 1950 DOA: 09/02/2017  PCP: Patient, No Pcp Per  Admit date: 09/02/2017 Discharge date: 09/04/2017  Admitted From: Home  Disposition:  Home   Recommendations for Outpatient Follow-up:  1. Establish with new PCP as soon as possible 2. Please obtain BMP and CBC and LFTs at first visit   Home Health: None  Equipment/Devices: None  Discharge Condition: Good  CODE STATUS: FULL Diet recommendation: diabetic  Brief/Interim Summary: Cameron Glover is a 67 y.o. M with HTN and DM who presented with several days progressive malaise and nausea, found to have BG >600, Creatinine >2, and K 8.4 with peaked T waves.  In the ER, repeat K was >7, he was started on IV fluids, insulin/glucose, bicarb, kayexalate, Lasix for K efflux and Lokelma.  Nephrology were consulted.     Discharge Diagnoses:   Hyperkalemia Resolved with medical therapies.   Acute kidney injury From anemia due to hyperglycemia.  Down to 1.4 today, urine output good, potassium resolved.  Suspect this may be his baseline. -He has neurology follow-up arranged  Diabetes Reports he used to be on Lantus in [place], but was taken off, is only taking metformin once daily now (the bottle rx states 1000 mg BID).  [facility] reports that Lantus, Levemir, basaglar, etc are all >$200 per month, cost-prohibitive.  Cr has improved enough to restart metformin. -Restart metformin -Start glipizide 2.5 BID, titration instructions given -Patient will call his insurance on Tuesday to verify insulin coverages; May need 70/30  -HOLD lisinopril  Hepatic steatosis Incidental finding on        Discharge Instructions  Discharge Instructions    Diet Carb Modified   Complete by:  As directed    Discharge instructions   Complete by:  As directed    From Dr. [name]: You were admitted for weakness, which turned out to be from high blood sugar (from  diabetes) that caused you to be severely dehyrdated, which caused you to have kidney injury and high potassium levels in your blood.  We gave you insulin and corrected your blood sugars.   We gave you fluids to correct your potassium and kidney function.   For now, for your diabetes: Restart your metformin   [facility]ist and I could not find an insulin option that is covered by your insurance. On Tuesday, when your insurance company is open, call the number on the back of your insurance card, and ask them which long-acting insulin (like Levemir or Lantus) is covered by your insurance.  In the meantime, add the medicine glipizide Take glipizide 2.5 mg (half tablet) twice daily Take it half an hour before eating (so take before breakfast and dinner)  Check your blood sugar every day in the morning before you eat anything. Write your blood sugars down, to take with you to your new doctor's appointment  If your blood sugar in the morning is still HIGHER than 180 mg/dL by Friday, then increase the glipizide to 5 mg in the morning and 5 mg at dinner  If your blood sugar is over 400 for 3 days in a row, come back to the ER or go to Urgent Care    FOR NOW: do not take lisinopril Have your new doctor check your kidney funciton and restart the lisinopril based on that  Take your cholesterol medicine, atorvastatin as before   Increase activity slowly   Complete by:  As directed  Allergies as of 09/04/2017   No Known Allergies     Medication List    STOP taking these medications   lisinopril 10 MG tablet Commonly known as:  PRINIVIL,ZESTRIL     TAKE these medications   atorvastatin 40 MG tablet Commonly known as:  LIPITOR Take 40 mg by mouth daily.   blood glucose meter kit and supplies Kit Dispense based on patient and insurance preference. Use up to four times daily as directed. (FOR ICD-9 250.00, 250.01).   glipiZIDE 5 MG tablet Commonly known as:  GLUCOTROL Take  0.5 tablets (2.5 mg total) by mouth 2 (two) times daily before a meal.   metFORMIN 1000 MG tablet Commonly known as:  GLUCOPHAGE Take 1,000 mg by mouth 2 (two) times daily with a meal.       No Known Allergies  Consultations:  Nephrology   Procedures/Studies: Dg Chest 2 View  Result Date: 09/03/2017 CLINICAL DATA:  Dyspnea EXAM: CHEST - 2 VIEW COMPARISON:  None. FINDINGS: The heart size and mediastinal contours are within normal limits. Both lungs are clear. The visualized skeletal structures are unremarkable. IMPRESSION: No active cardiopulmonary disease. Electronically Signed   By: Franchot Gallo M.D.   On: 09/03/2017 15:02   US Renal  Result Date: 09/03/2017 CLINICAL DATA:  Patient with acute renal failure. EXAM: RENAL / URINARY TRACT ULTRASOUND COMPLETE COMPARISON:  None. FINDINGS: Right Kidney: Length: 9.9 cm. Echogenicity within normal limits. No mass or hydronephrosis visualized. Left Kidney: Length: 11.5 cm. Echogenicity within normal limits. No mass or hydronephrosis visualized. Bladder: Mild wall thickening of the urinary bladder.  Prostate is enlarged. Echogenic hepatic parenchyma. IMPRESSION: No hydronephrosis. Mild wall thickening of the urinary bladder may be secondary to outlet obstruction. Recommend correlation with urinalysis to exclude infection. Hepatic steatosis. Electronically Signed   By: Lovey Newcomer M.D.   On: 09/03/2017 01:59       Subjective: Feeling well.  Good appetite.  No confusion, malaise, headache, LOC.  No chest pain, dyspnea, swelling.  Discharge Exam: Vitals:   09/03/17 2129 09/04/17 0625  BP: 111/76 129/84  Pulse: 93 80  Resp: 18 18  Temp: 98 F (36.7 C) 98 F (36.7 C)  SpO2: 99% 99%   Vitals:   09/03/17 1231 09/03/17 1642 09/03/17 2129 09/04/17 0625  BP: 108/76  111/76 129/84  Pulse: 75  93 80  Resp: _0 Temp:  97.8 F (36.6 C) 98 F (36.7 C) 98 F (36.7 C)  TempSrc:  Oral    SpO2: 100%  99% 99%  Weight:      Height:         General: Pt is alert, awake, not in acute distress, lying in bed, interactive Cardiovascular: RRR, S1/S2 +, no rubs, no gallops Respiratory: CTA bilaterally, no wheezing, no rhonchi Abdominal: Soft, NT, ND, bowel sounds + Extremities: no edema, no cyanosis    The results of significant diagnostics from this hospitalization (including imaging, microbiology, ancillary and laboratory) are listed below for reference.     Microbiology: Recent Results (from the past 240 hour(s))  Gastrointestinal Panel by PCR , Stool     Status: None   Collection Time: 09/03/17 12:45 AM  Result Value Ref Range Status   Campylobacter species NOT DETECTED NOT DETECTED Final   Plesimonas shigelloides NOT DETECTED NOT DETECTED Final   Salmonella species NOT DETECTED NOT DETECTED Final   Yersinia enterocolitica NOT DETECTED NOT DETECTED Final   Vibrio species NOT DETECTED NOT DETECTED Final  Vibrio cholerae NOT DETECTED NOT DETECTED Final   Enteroaggregative E coli (EAEC) NOT DETECTED NOT DETECTED Final   Enteropathogenic E coli (EPEC) NOT DETECTED NOT DETECTED Final   Enterotoxigenic E coli (ETEC) NOT DETECTED NOT DETECTED Final   Shiga like toxin producing E coli (STEC) NOT DETECTED NOT DETECTED Final   Shigella/Enteroinvasive E coli (EIEC) NOT DETECTED NOT DETECTED Final   Cryptosporidium NOT DETECTED NOT DETECTED Final   Cyclospora cayetanensis NOT DETECTED NOT DETECTED Final   Entamoeba histolytica NOT DETECTED NOT DETECTED Final   Giardia lamblia NOT DETECTED NOT DETECTED Final   Adenovirus F40/41 NOT DETECTED NOT DETECTED Final   Astrovirus NOT DETECTED NOT DETECTED Final   Norovirus GI/GII NOT DETECTED NOT DETECTED Final   Rotavirus A NOT DETECTED NOT DETECTED Final   Sapovirus (I, II, IV, and V) NOT DETECTED NOT DETECTED Final    Comment: Performed at [facility], [address] [place], [place] [address]  MRSA PCR Screening     Status: None   Collection Time: 09/03/17 12:59  AM  Result Value Ref Range Status   MRSA by PCR NEGATIVE NEGATIVE Final    Comment:        The GeneXpert MRSA Assay (FDA approved for NASAL specimens only), is one component of a comprehensive MRSA colonization surveillance program. It is not intended to diagnose MRSA infection nor to guide or monitor treatment for MRSA infections. Performed at [facility], [place] [address] [place], [place] [address]      Labs: BNP (last 3 results) No results for input(s): BNP in the last 8760 hours. Basic Metabolic Panel: Recent Labs  Lab 09/02/17 1320 09/02/17 1738 09/02/17 1806 09/02/17 2236 09/03/17 0455 09/03/17 0852 09/03/17 1156 09/03/17 1629 09/04/17 0455  NA 132* 138 137 144  --  145  --   --  139  K >7.5* >7.5* 8.5* 5.1 4.6 4.3 4.5 4.4 3.8  CL 101 107 109 108  --  111  --   --  110  CO2 17* 24  --  25  --  26  --   --  23  GLUCOSE 711* 475* 453* 414*  --  244*  --   --  152*  BUN 49* 47* 74* 44*  --  36*  --   --  28*  CREATININE 2.31* 2.21* 2.10* 2.19*  --  1.94*  --   --  1.43*  CALCIUM 10.1 9.7  --  9.6  --  8.5*  --   --  7.8*  MG  --   --   --  2.8*  --   --   --   --   --   PHOS  --   --   --  5.7*  --   --   --   --   --    Liver Function Tests: Recent Labs  Lab 09/02/17 1320 09/02/17 1738  AST 58* 34  ALT 50* 42  ALKPHOS 90 77  BILITOT 0.8 0.8  PROT 8.1 7.3  ALBUMIN 4.5 4.1   Recent Labs  Lab 09/02/17 1738  LIPASE 42   No results for input(s): AMMONIA in the last 168 hours. CBC: Recent Labs  Lab 09/02/17 1315 09/02/17 1421 09/02/17 1806 09/04/17 0649  WBC  --  7.5  --  5.2  NEUTROABS  --  4.3  --   --   HGB 18.7* 16.9 17.3* 11.3*  HCT 55.0* 50.1 51.0 34.9*  MCV  --  92.3  --  95.4  PLT  --  364  --  150   Cardiac Enzymes: Recent Labs  Lab 09/02/17 2236  CKTOTAL 186  TROPONINI <0.03   BNP: Invalid input(s): POCBNP CBG: Recent Labs  Lab 09/03/17 1211 09/03/17 1650 09/03/17 2130 09/04/17 0806 09/04/17 1200  GLUCAP 272*  253* 235* 143* 218*   D-Dimer No results for input(s): DDIMER in the last 72 hours. Hgb A1c Recent Labs    09/03/17 0455  HGBA1C 13.6*   Lipid Profile No results for input(s): CHOL, HDL, LDLCALC, TRIG, CHOLHDL, LDLDIRECT in the last 72 hours. Thyroid function studies No results for input(s): TSH, T4TOTAL, T3FREE, THYROIDAB in the last 72 hours.  Invalid input(s): FREET3 Anemia work up No results for input(s): VITAMINB12, FOLATE, FERRITIN, TIBC, IRON, RETICCTPCT in the last 72 hours. Urinalysis    Component Value Date/Time   COLORURINE YELLOW 09/02/2017 1924   APPEARANCEUR CLEAR 09/02/2017 1924   LABSPEC 1.026 09/02/2017 1924   PHURINE 6.0 09/02/2017 1924   GLUCOSEU >=500 (A) 09/02/2017 [place] NEGATIVE 09/02/2017 [place] NEGATIVE 09/02/2017 1924   KETONESUR 5 (A) 09/02/2017 1924   PROTEINUR NEGATIVE 09/02/2017 1924   NITRITE NEGATIVE 09/02/2017 1924   LEUKOCYTESUR NEGATIVE 09/02/2017 1924   Sepsis Labs Invalid input(s): PROCALCITONIN,  WBC,  LACTICIDVEN Microbiology Recent Results (from the past 240 hour(s))  Gastrointestinal Panel by PCR , Stool     Status: None   Collection Time: 09/03/17 12:45 AM  Result Value Ref Range Status   Campylobacter species NOT DETECTED NOT DETECTED Final   Plesimonas shigelloides NOT DETECTED NOT DETECTED Final   Salmonella species NOT DETECTED NOT DETECTED Final   Yersinia enterocolitica NOT DETECTED NOT DETECTED Final   Vibrio species NOT DETECTED NOT DETECTED Final   Vibrio cholerae NOT DETECTED NOT DETECTED Final   Enteroaggregative E coli (EAEC) NOT DETECTED NOT DETECTED Final   Enteropathogenic E coli (EPEC) NOT DETECTED NOT DETECTED Final   Enterotoxigenic E coli (ETEC) NOT DETECTED NOT DETECTED Final   Shiga like toxin producing E coli (STEC) NOT DETECTED NOT DETECTED Final   Shigella/Enteroinvasive E coli (EIEC) NOT DETECTED NOT DETECTED Final   Cryptosporidium NOT DETECTED NOT DETECTED Final   Cyclospora  cayetanensis NOT DETECTED NOT DETECTED Final   Entamoeba histolytica NOT DETECTED NOT DETECTED Final   Giardia lamblia NOT DETECTED NOT DETECTED Final   Adenovirus F40/41 NOT DETECTED NOT DETECTED Final   Astrovirus NOT DETECTED NOT DETECTED Final   Norovirus GI/GII NOT DETECTED NOT DETECTED Final   Rotavirus A NOT DETECTED NOT DETECTED Final   Sapovirus (I, II, IV, and V) NOT DETECTED NOT DETECTED Final    Comment: Performed at [facility], [place] [place], [place] [address]  MRSA PCR Screening     Status: None   Collection Time: 09/03/17 12:59 AM  Result Value Ref Range Status   MRSA by PCR NEGATIVE NEGATIVE Final    Comment:        The GeneXpert MRSA Assay (FDA approved for NASAL specimens only), is one component of a comprehensive MRSA colonization surveillance program. It is not intended to diagnose MRSA infection nor to guide or monitor treatment for MRSA infections. Performed at [facility], [place] [address] [place], [place] [address]      Time coordinating discharge: 40 minutes       SIGNED:   Edwin Dada, MD  [facility] 09/04/2017, 3:55 PM

## 2017-09-04 NOTE — Care Management (Signed)
[facility]. Lantus ~$300/m and Levemir ~$45/m. Information given to Dr [name] and he will precede with changing DC plan to use Levemir. Spoke w bedside RN to print out new AVS and stress the difference to patient at DC so he understands which medication to pick up at pharmacy.

## 2017-09-04 NOTE — Progress Notes (Signed)
[facility] Progress Note   Subjective:  Feels better this AM.  Full breakfast, normal UOP. Planning D/C  Objective Vitals:   09/03/17 1231 09/03/17 1642 09/03/17 2129 09/04/17 0625  BP: 108/76  111/76 129/84  Pulse: 75  93 80  Resp: 15  18 18   Temp:  97.8 F (36.6 C) 98 F (36.7 C) 98 F (36.7 C)  TempSrc:  Oral    SpO2: 100%  99% 99%  Weight:      Height:       Physical Exam General: comfortable flat in bed Heart: RRR Lungs: dec BS bases, no rales or wheezes, normal WOB on RA Abdomen: soft, nontender Extremities: no edema Neuro: nonfocal  Additional Objective Labs: Basic Metabolic Panel: Recent Labs  Lab 09/02/17 2236  09/03/17 0852 09/03/17 1156 09/03/17 1629 09/04/17 0455  NA 144  --  145  --   --  139  K 5.1   < > 4.3 4.5 4.4 3.8  CL 108  --  111  --   --  110  CO2 25  --  26  --   --  23  GLUCOSE 414*  --  244*  --   --  152*  BUN 44*  --  36*  --   --  28*  CREATININE 2.19*  --  1.94*  --   --  1.43*  CALCIUM 9.6  --  8.5*  --   --  7.8*  PHOS 5.7*  --   --   --   --   --    < > = values in this interval not displayed.   Liver Function Tests: Recent Labs  Lab 09/02/17 1320 09/02/17 1738  AST 58* 34  ALT 50* 42  ALKPHOS 90 77  BILITOT 0.8 0.8  PROT 8.1 7.3  ALBUMIN 4.5 4.1   Recent Labs  Lab 09/02/17 1738  LIPASE 42   CBC: Recent Labs  Lab 09/02/17 1421 09/02/17 1806 09/04/17 0649  WBC 7.5  --  5.2  NEUTROABS 4.3  --   --   HGB 16.9 17.3* 11.3*  HCT 50.1 51.0 34.9*  MCV 92.3  --  95.4  PLT 364  --  150   Blood Culture No results found for: SDES, SPECREQUEST, CULT, REPTSTATUS  Cardiac Enzymes: Recent Labs  Lab 09/02/17 2236  CKTOTAL 186  TROPONINI <0.03   CBG: Recent Labs  Lab 09/03/17 0846 09/03/17 1211 09/03/17 1650 09/03/17 2130 09/04/17 0806  GLUCAP 200* 272* 253* 235* 143*   Iron Studies: No results for input(s): IRON, TIBC, TRANSFERRIN, FERRITIN in the last 72  hours. @lablastinr3 @ Studies/Results: Dg Chest 2 View  Result Date: 09/03/2017 CLINICAL DATA:  Dyspnea EXAM: CHEST - 2 VIEW COMPARISON:  None. FINDINGS: The heart size and mediastinal contours are within normal limits. Both lungs are clear. The visualized skeletal structures are unremarkable. IMPRESSION: No active cardiopulmonary disease. Electronically Signed   By: Franchot Gallo M.D.   On: 09/03/2017 15:02   US Renal  Result Date: 09/03/2017 CLINICAL DATA:  Patient with acute renal failure. EXAM: RENAL / URINARY TRACT ULTRASOUND COMPLETE COMPARISON:  None. FINDINGS: Right Kidney: Length: 9.9 cm. Echogenicity within normal limits. No mass or hydronephrosis visualized. Left Kidney: Length: 11.5 cm. Echogenicity within normal limits. No mass or hydronephrosis visualized. Bladder: Mild wall thickening of the urinary bladder.  Prostate is enlarged. Echogenic hepatic parenchyma. IMPRESSION: No hydronephrosis. Mild wall thickening of the urinary bladder may be secondary to outlet obstruction. Recommend  correlation with urinalysis to exclude infection. Hepatic steatosis. Electronically Signed   By: Lovey Newcomer M.D.   On: 09/03/2017 01:59   Medications: . sodium chloride 75 mL/hr at 09/04/17 0345   . Chlorhexidine Gluconate Cloth  6 each Topical Q0600  . insulin aspart  0-5 Units Subcutaneous QHS  . insulin aspart  0-9 Units Subcutaneous TID WC  . insulin glargine  10 Units Subcutaneous QHS    Assessment/Plan:  1.  Severe hyperkalemia:  Resolved. 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 Ok to D/C  2.  AKI +/-CKD:  Cr improved to 1.48 this AM.  Suspect he has CKD secondary to uncontrolled DM (dx 9 years ago, A1c almost 14 on admission) but no other labs for review.  Renal shows no hydronephrosis but bladder thickening possibly due to BOO.  He denies voiding  difficulties and no UTI on UA.   Discussed control of DM as key factor to delaying progression of CKD (sister had ESRD). I'll see him in clinic in 1 month - will have office contact him. If everything normal will release him to PCP (he needs to est a local PCP).   3.  GI symptoms: GI panel unrevealing.  Symptoms resolved    4.  HTN:  Currently hypo to normotensive.  Hold antiHTN therapy until f/u.    Will sign off. Please call with any questions or concerns.   Jannifer Hick MD [facility] Pager [phone]

## 2017-09-04 NOTE — Progress Notes (Signed)
Cameron Glover to be D/C'd home per MD order. Discussed with the patient and all questions fully answered. VVS, Skin clean, dry and intact without evidence of skin break down, no evidence of skin tears noted.  IV catheter discontinued intact. Site without signs and symptoms of complications. Dressing and pressure applied.  An After Visit Summary and prescription was printed and given to the patient. Emphasized on importance of checking blood sugars and following up with PCP. Patient escorted via [place], and D/C home via private auto.  [name]  09/04/2017 1:14 PM

## 2017-09-04 NOTE — Care Management Note (Signed)
Case Management Note  Patient Details  Name: Cameron Glover MRN: [id number] Date of Birth: 1950-03-06  Subjective/Objective:                    Action/Plan:  Provided patient with list of clinics he can call to schedule a PCP appointment.   Expected Discharge Date:  09/04/17               Expected Discharge Plan:  Home/Self Care  In-House Referral:     Discharge planning Services  Other - See comment  Post Acute Care Choice:    Choice offered to:     DME Arranged:    DME Agency:     HH Arranged:    HH Agency:     Status of Service:  Completed, signed off  If discussed at [employer] of Stay Meetings, dates discussed:    Additional Comments:  Carles Collet, RN 09/04/2017, 10:04 AM

## 2017-09-04 NOTE — Progress Notes (Signed)
   09/04/17 1000  Clinical Encounter Type  Visited With Patient  Visit Type Follow-up  Referral From Nurse  Chaplain explained that AD are performed weekdays and someone from Spiritual care will contact him on Monday if he is still in hospital. Chaplain informed Pt that [facility] services were available 24/7.

## 2017-09-16 ENCOUNTER — Encounter: Payer: Self-pay | Admitting: Family Medicine

## 2017-09-16 ENCOUNTER — Ambulatory Visit (INDEPENDENT_AMBULATORY_CARE_PROVIDER_SITE_OTHER): Payer: Medicare Other | Admitting: Family Medicine

## 2017-09-16 ENCOUNTER — Other Ambulatory Visit: Payer: Self-pay

## 2017-09-16 VITALS — BP 148/82 | HR 80 | Temp 98.1°F | Resp 14 | Ht 66.0 in | Wt 177.0 lb

## 2017-09-16 DIAGNOSIS — E1165 Type 2 diabetes mellitus with hyperglycemia: Secondary | ICD-10-CM

## 2017-09-16 DIAGNOSIS — N179 Acute kidney failure, unspecified: Secondary | ICD-10-CM | POA: Diagnosis not present

## 2017-09-16 DIAGNOSIS — Z7689 Persons encountering health services in other specified circumstances: Secondary | ICD-10-CM

## 2017-09-16 DIAGNOSIS — D631 Anemia in chronic kidney disease: Secondary | ICD-10-CM

## 2017-09-16 DIAGNOSIS — I10 Essential (primary) hypertension: Secondary | ICD-10-CM | POA: Diagnosis not present

## 2017-09-16 DIAGNOSIS — N189 Chronic kidney disease, unspecified: Secondary | ICD-10-CM | POA: Diagnosis not present

## 2017-09-16 DIAGNOSIS — Z09 Encounter for follow-up examination after completed treatment for conditions other than malignant neoplasm: Secondary | ICD-10-CM

## 2017-09-16 LAB — GLUCOSE 16585: Glucose: 105 mg/dL — ABNORMAL HIGH (ref 65–99)

## 2017-09-16 NOTE — Progress Notes (Signed)
Patient ID: Cameron Glover, male    DOB: September 12, 1950, 67 y.o.   MRN: [id number]  PCP: [name], PA-C  Chief Complaint  Patient presents with  . New Patient    is not fasting    Subjective:   Cameron Glover is a 67 y.o. male, presents to clinic with CC of uncontrolled type 2 diabetes and acute kidney injury with nausea and vomiting and a hospital admission on September 02, 2017 discharged 2 days later, he is new to establish care as well.  He moved to the area 14 months ago and has not established with a primary care provider locally.  He is from [place], moved her to be with a sister who recently passed away from ESRD and brain bleed. He reports past medical history of diabetes, HTN and HLD.    Review of hospital records shows that he presented to urgent care with 5 days of nausea vomiting with severe weakness and some abdominal pain, was found to be tachycardic, hyperglycemic with CBGs greater than 600, and critically elevated potassium of higher than 8 he was transferred to the ER.  He was admitted after ER brought down his potassium and rehydrated him, EKG did show some peak T waves but this resolved with critical care done in the ER, and hospitalization he was evaluated by nephrology.  Serum creatinine improved from 2.31 at admission to 1.43 at discharge with GFR 58, they did suspect some chronic kidney disease, he was instructed to hold his lisinopril at that time.   When he was admitted hemoglobin was high, 18.7, secondary to hypokvolemia, discharged with hemoglobin 11.3 with some suspected heme dilution or anemia secondary to renal disease. Since being discharged patient has not had any abdominal pain but he occasionally gets a little nauseated.  He is only taking metformin and glipizide.  In reviewing hospital records carefully case management did call multiple pharmacies to obtain cost for insulin and found that Levemir was most affordable and the discharging hospitalist, Dr.  [name] did change discharge plan to use Levemir.  Per the discharge summary [facility] reported Lantus Levemir Basaglar were all greater than $200 per month and cost prohibitive, and said metformin was restarted because of improved creatinine.  Patient was supposed to call his insurance on Tuesday to verify insulin coverages.  Patient seems to be unaware of this and is not currently taking any insulin does not know of any plan to get this were discussed with his pharmacy. Patient states that he is checking his sugars are usually in the 100s, he has had not had any extremely high or low sugars.  He still feels slightly weak, still drinking and peeing a lot but is better than before he went to the hospital.  Reviewed his history of diabetes T2DM:  Reports dx of DM 6-7 years ago, however per hospital records he reported to them at least 9 years history of diabetes.  States he is currently on metformin, glipizide, can't remember the rest of the meds.  When leaving [place] he was on 3-4 meds at a time and when he first was dx he was on insulin injections in roughly 3 years ago switched to oral medications. He ran out to his car to get his medications and return to the exam room and reviewing his medications and per hospital records he is currently only on metformin and glipizide.  He says that his doctor in [place] has been refilling his medications for the past year and a family  member has been getting them and mailing them to him.  He is unsure what other medication he has been out of. He does not recall his previous doctor's name but he has a card in his wallet with the following information: [place], [place] - PCP and pharmacy [facility] - Dr. Truddie Coco, internal medicine.  He also states that he has previously seen nephrology in [place].  He does not recall the doctor's name.  He reports a strong family history of multiple deaths in early age which pleases from cancer and possibly  kidney disease.  He is unaware of any genetic kidney abnormalities.    family hx with multiple siblings with diabetes, HTN, Has three living sisters and 1 brothers living, 6 deceased siblings.   Living siblings - 5 brothers and 1 sister, he is 5th to youngest child.  He says most of them died from cancer, but no ideas Recently deceased sister was on dialysis and waiting for kidney transplant.  Pt is scheduled with a nephrologist in about 3 weeks.  Parents died when he was 74 or 29, doesn't know their medical hx  Last colonoscopy 2 years ago   Diabetes Mellitus Type II, Follow-up:   Lab Results  Component Value Date   HGBA1C 13.6 (H) 09/03/2017    Last seen for diabetes 18 months ago.  Management since then includes noncompliance with likely multiple medicines at the time of hospitalization 2 weeks ago he was only taking metformin. He reports poor compliance with treatment. He is having side effects.  Current symptoms include hyperglycemia, increase appetite, nausea, polydipsia, polyuria, vomitting and weight loss and have been improving minimally since hospitalization Home blood sugar records: checks sugars twice a day, 118 and 128 today  meds changed from ER 09/04/17- recent range of sugars over past 2 weeks 118- 223.  HE does not have his meter with him.  205lbs  before he moved, 177 lbs today  Episodes of hypoglycemia? no   Current Insulin Regimen: in the past was on insulin, not recently, 3 years ago Most Recent Eye Exam: needs eye exam Weight trend: see above Prior visit with dietician: no Current diet: not asked Current exercise: walking  Pertinent Labs:    Component Value Date/Time   CREATININE 1.43 (H) 09/04/2017 0455    Wt Readings from Last 3 Encounters:  09/16/17 177 lb (80.3 kg)  09/03/17 163 lb 5.8 oz (74.1 kg)   Goes to the restroom 3 x bathroom at night ------------------------------------------------------------------------  Hx of HLD - on Lipitor, he  denies any history of difficulty with taking statins.  He is not currently on aspirin and does not know if he is been told to take this before. HTN -previously on lisinopril 10 mg but this was held in the ER with administration of Lasix and IV fluid to get potassium down.  To need to hold this since hospitalization and was needed to be rechecked before restarting it.  Blood pressures were soft while hospitalized due to hypovolemia.  His blood pressure is mildly elevated.  He denies any chest pain, headaches, syncopal episodes, chest pressure, shortness of breath, palpitations, lower extremity edema.  Denies any known cardiac disease, events, procedures.  He has not been checking his blood pressure since discharge in the hospital and does not have a way to check it at home.   Patient has never smoked cigarettes after trying it one time as a youth and getting lightheaded.  He denies any alcohol use at all  and I will not even occasionally.  He denies any illegal drug use or marijuana use.  Patient is not married.  He states that he is sexually active and in the last year he has had at least 44 male partners.    Asked about other health maintenance, screening labs or test he does not know any of that medical history.  Unsure if he last got flu, Pneumovax, shingles.  Unsure if he has had any prostate screening test.  He reports having colonoscopy 2 years ago and we will request records.    Labs when discharged from hospital  Results for TAVARI, LOADHOLT (MRN [id number]) as of 09/16/2017 19:01  Ref. Range 09/04/2017 37:90  BASIC METABOLIC PANEL Unknown Rpt (A)  Sodium Latest Ref Range: 135 - 145 mmol/L 139  Potassium Latest Ref Range: 3.5 - 5.1 mmol/L 3.8  Chloride Latest Ref Range: 98 - 111 mmol/L 110  CO2 Latest Ref Range: 22 - 32 mmol/L 23  Glucose Latest Ref Range: 70 - 99 mg/dL 152 (H)  BUN Latest Ref Range: 8 - 23 mg/dL 28 (H)  Creatinine Latest Ref Range: 0.61 - 1.24 mg/dL 1.43 (H)  Calcium Latest  Ref Range: 8.9 - 10.3 mg/dL 7.8 (L)  Anion gap Latest Ref Range: 5 - 15  6  GFR, Est Non African American Latest Ref Range: >60 mL/min 50 (L)  GFR, Est African American Latest Ref Range: >60 mL/min 58 (L)  Results for PRENTISS, POLIO (MRN [id number]) as of 09/16/2017 19:01  Ref. Range 09/04/2017 06:49  WBC Latest Ref Range: 4.0 - 10.5 K/uL 5.2  RBC Latest Ref Range: 4.22 - 5.81 MIL/uL 3.66 (L)  Hemoglobin Latest Ref Range: 13.0 - 17.0 g/dL 11.3 (L)  HCT Latest Ref Range: 39.0 - 52.0 % 34.9 (L)  MCV Latest Ref Range: 78.0 - 100.0 fL 95.4  MCH Latest Ref Range: 26.0 - 34.0 pg 30.9  MCHC Latest Ref Range: 30.0 - 36.0 g/dL 32.4  RDW Latest Ref Range: 11.5 - 15.5 % 11.5  Platelets Latest Ref Range: 150 - 400 K/uL 150     Patient Active Problem List   Diagnosis Date Noted  . Acute renal failure (ARF) ([place])   . Hyperglycemia without ketosis   . Hyperkalemia 09/02/2017  . DM (diabetes mellitus) with complications ([place]) 99/24/2683  . AKI (acute kidney injury) ([place]) 09/02/2017  . Diarrhea 09/02/2017  . Dehydration 09/02/2017     Prior to Admission medications   Medication Sig Start Date End Date Taking? Authorizing Provider  atorvastatin (LIPITOR) 40 MG tablet Take 40 mg by mouth daily.   Yes [provider]  blood glucose meter kit and supplies KIT Dispense based on patient and insurance preference. Use up to four times daily as directed. (FOR ICD-9 250.00, 250.01). 09/04/17  Yes Danford, Suann Larry, MD  glipiZIDE (GLUCOTROL) 5 MG tablet Take 0.5 tablets (2.5 mg total) by mouth 2 (two) times daily before a meal. 09/04/17 09/04/18 Yes Danford, Suann Larry, MD  metFORMIN (GLUCOPHAGE) 1000 MG tablet Take 1,000 mg by mouth 2 (two) times daily with a meal.   Yes [provider]     No Known Allergies   Family History  Problem Relation Age of Onset  . Kidney failure Sister   . Cancer Sister   . Diabetes Sister   . Hypertension Sister   . Hyperlipidemia Sister   . Heart  disease Sister   . Kidney disease Sister   . Cancer Brother   . CAD Brother   .  Diabetes Brother   . Hypertension Brother   . Hyperlipidemia Brother   . Heart disease Brother   . Diabetes Other   . Cancer Mother      Social History   Socioeconomic History  . Marital status: Single    Spouse name: Not on file  . Number of children: Not on file  . Years of education: Not on file  . Highest education level: High school graduate  Occupational History  . Occupation: Retired    Comment: Dealer  . Financial resource strain: Not very hard  . Food insecurity:    Worry: Sometimes true    Inability: Sometimes true  . Transportation needs:    Medical: No    Non-medical: No  Tobacco Use  . Smoking status: Never Smoker  . Smokeless tobacco: Never Used  Substance and Sexual Activity  . Alcohol use: Never    Frequency: Never  . Drug use: Never  . Sexual activity: Yes  Lifestyle  . Physical activity:    Days per week: 5 days    Minutes per session: 30 min  . Stress: To some extent  Relationships  . Social connections:    Talks on phone: More than three times a week    Gets together: More than three times a week    Attends religious service: Never    Active member of club or organization: No    Attends meetings of clubs or organizations: Never    Relationship status: Never married  . Intimate partner violence:    Fear of current or ex partner: Not on file    Emotionally abused: Not on file    Physically abused: Not on file    Forced sexual activity: Not on file  Other Topics Concern  . Not on file  Social History Narrative  . Not on file     Review of Systems  Constitutional: Negative.  Negative for chills, diaphoresis, fatigue and fever.  HENT: Negative.  Negative for congestion.   Eyes: Negative.   Respiratory: Negative.  Negative for chest tightness and shortness of breath.   Cardiovascular: Negative.  Negative for chest pain, palpitations  and leg swelling.  Gastrointestinal: Negative.  Negative for abdominal pain.  Genitourinary: Negative.   Musculoskeletal: Negative.   Skin: Negative.  Negative for color change, pallor and rash.  Allergic/Immunologic: Negative.   Neurological: Negative.  Negative for dizziness, facial asymmetry and headaches.  Hematological: Negative.   Psychiatric/Behavioral: Negative.   10 Systems reviewed and are negative for acute change except as noted in the HPI.      Objective:    Vitals:   09/16/17 1601  BP: (!) 148/82  Pulse: 80  Resp: 14  Temp: 98.1 F (36.7 C)  TempSrc: Oral  SpO2: 100%  Weight: 177 lb (80.3 kg)  Height: '5\' 6"'  (1.676 m)      Physical Exam  Constitutional: He is oriented to person, place, and time. He appears well-developed and well-nourished.  Non-toxic appearance. He does not appear ill. No distress.  HENT:  Head: Normocephalic and atraumatic.  Right Ear: Tympanic membrane, external ear and ear canal normal.  Left Ear: Tympanic membrane, external ear and ear canal normal.  Nose: Nose normal. No mucosal edema or rhinorrhea. Right sinus exhibits no maxillary sinus tenderness and no frontal sinus tenderness. Left sinus exhibits no maxillary sinus tenderness and no frontal sinus tenderness.  Mouth/Throat: Uvula is midline and oropharynx is clear and moist. Mucous membranes are dry  and not cyanotic. No trismus in the jaw. No uvula swelling. No oropharyngeal exudate, posterior oropharyngeal edema or posterior oropharyngeal erythema.  Eyes: Pupils are equal, round, and reactive to light. Conjunctivae, EOM and lids are normal.  Neck: Trachea normal, normal range of motion and phonation normal. Neck supple. No tracheal deviation present.  Cardiovascular: Regular rhythm, normal heart sounds and normal pulses. Exam reveals no gallop and no friction rub.  No murmur heard. Pulses:      Radial pulses are 2+ on the right side, and 2+ on the left side.       Posterior tibial  pulses are 2+ on the right side, and 2+ on the left side.  No lower extremity edema  Pulmonary/Chest: Effort normal and breath sounds normal. He has no wheezes. He has no rhonchi. He has no rales.  Abdominal: Soft. Normal appearance and bowel sounds are normal. He exhibits no distension. There is no tenderness. There is no rebound and no guarding.  Musculoskeletal: Normal range of motion. He exhibits no edema.  Neurological: He is alert and oriented to person, place, and time. Gait normal.  Skin: Skin is warm, dry and intact. Capillary refill takes less than 2 seconds. No rash noted. He is not diaphoretic. No cyanosis. Nails show no clubbing.  Normal skin turgor  Psychiatric: He has a normal mood and affect. His speech is normal and behavior is normal.  Nursing note and vitals reviewed.   Results for orders placed or performed in visit on 09/16/17  GLUCOSE 48546  Result Value Ref Range   Glucose 105 (H) 65 - 99 mg/dL         Assessment & Plan:      ICD-10-CM   1. Uncontrolled type 2 diabetes mellitus with hyperglycemia (HCC) E70.35 COMPLETE METABOLIC PANEL WITH GFR    CBC with Differential    GLUCOSE 00938   13.6 hemoglobin A1c recheck in 3 months, sugar log, close follow-up over the next couple weeks and months  2. Hypertension, unspecified type I10    Goal of 130/80, currently untreated due to holding lisinopril, mildly hypertensive in clinic, recheck renal function  3. AKI (acute kidney injury) ([place]) H82.9 COMPLETE METABOLIC PANEL WITH GFR   Repeat CBC to reevaluate renal function, his baseline is currently unknown.  Would not be surprised with some CKD, established with nephrology  4. Anemia due to chronic kidney disease, unspecified CKD stage N18.9 CBC with Differential   D63.1    Recheck CBC, no concerning symptoms today, but mildly pale oral mucosa, VSS  5. Encounter to establish care with new doctor Z76.89    Reviewed all parts of patient's history, obtaining records  6.  Encounter for examination following treatment at hospital Z09    Extensive review of all documentation of recent hospitalization, reviewed labs, renal US, care management notes    CBG done here today normal at 105.  His symptoms of hyperglycemia with auto diuresing are minimally improved.  Will obtain CMP and CBC to check renal function, anemia, and other lab abnormalities present at discharge.  Hemoglobin A1c done in hospital was 13.6 on September 03, 2017.   Patient was instructed to keep a blood sugar log with multiple readings a day including fasting blood sugars.  Return to clinic to evaluate for med adjustments to better control his diabetes.  We will continue to hold lisinopril until his renal function checked.  Would like to restart ACEI as soon as possible to protect kidney function in setting of  type 2 diabetes and hyperlipidemia.  Over 60 minutes was spent with this pt today to establish care, address multiple uncontrolled medical problems, and review recent hospitalizations.  Approximately 40 min of this time was spent face-to-face with patient, addressing extensive list of acute and chronic medical problems, obtaining point-of-care labs, requiring extensive time in counseling and coordination of care. Remaining time was spent reviewing records and documenting.   [name], PA-C 09/16/17 4:18 PM

## 2017-09-16 NOTE — Patient Instructions (Addendum)
Uncontrolled diabetes - try to eat low carb.  Take your two meds that you have.  Continue to take lipitor and hold lisinopril until I check your labs.  Track sugars - morning fasting sugars and at least one or two other sugars during the day.   Diabetes Mellitus and Nutrition When you have diabetes (diabetes mellitus), it is very important to have healthy eating habits because your blood sugar (glucose) levels are greatly affected by what you eat and drink. Eating healthy foods in the appropriate amounts, at about the same times every day, can help you:  Control your blood glucose.  Lower your risk of heart disease.  Improve your blood pressure.  Reach or maintain a healthy weight.  Every person with diabetes is different, and each person has different needs for a meal plan. Your health care provider may recommend that you work with a diet and nutrition specialist (dietitian) to make a meal plan that is best for you. Your meal plan may vary depending on factors such as:  The calories you need.  The medicines you take.  Your weight.  Your blood glucose, blood pressure, and cholesterol levels.  Your activity level.  Other health conditions you have, such as heart or kidney disease.  How do carbohydrates affect me? Carbohydrates affect your blood glucose level more than any other type of food. Eating carbohydrates naturally increases the amount of glucose in your blood. Carbohydrate counting is a method for keeping track of how many carbohydrates you eat. Counting carbohydrates is important to keep your blood glucose at a healthy level, especially if you use insulin or take certain oral diabetes medicines. It is important to know how many carbohydrates you can safely have in each meal. This is different for every person. Your dietitian can help you calculate how many carbohydrates you should have at each meal and for snack. Foods that contain carbohydrates include:  Bread, cereal,  rice, pasta, and crackers.  Potatoes and corn.  Peas, beans, and lentils.  Milk and yogurt.  Fruit and juice.  Desserts, such as cakes, cookies, ice cream, and candy.  How does alcohol affect me? Alcohol can cause a sudden decrease in blood glucose (hypoglycemia), especially if you use insulin or take certain oral diabetes medicines. Hypoglycemia can be a life-threatening condition. Symptoms of hypoglycemia (sleepiness, dizziness, and confusion) are similar to symptoms of having too much alcohol. If your health care provider says that alcohol is safe for you, follow these guidelines:  Limit alcohol intake to no more than 1 drink per day for nonpregnant women and 2 drinks per day for men. One drink equals 12 oz of beer, 5 oz of wine, or 1 oz of hard liquor.  Do not drink on an empty stomach.  Keep yourself hydrated with water, diet soda, or unsweetened iced tea.  Keep in mind that regular soda, juice, and other mixers may contain a lot of sugar and must be counted as carbohydrates.  What are tips for following this plan? Reading food labels  Start by checking the serving size on the label. The amount of calories, carbohydrates, fats, and other nutrients listed on the label are based on one serving of the food. Many foods contain more than one serving per package.  Check the total grams (g) of carbohydrates in one serving. You can calculate the number of servings of carbohydrates in one serving by dividing the total carbohydrates by 15. For example, if a food has 30 g of total carbohydrates,  it would be equal to 2 servings of carbohydrates.  Check the number of grams (g) of saturated and trans fats in one serving. Choose foods that have low or no amount of these fats.  Check the number of milligrams (mg) of sodium in one serving. Most people should limit total sodium intake to less than 2,300 mg per day.  Always check the nutrition information of foods labeled as "low-fat" or  "nonfat". These foods may be higher in added sugar or refined carbohydrates and should be avoided.  Talk to your dietitian to identify your daily goals for nutrients listed on the label. Shopping  Avoid buying canned, premade, or processed foods. These foods tend to be high in fat, sodium, and added sugar.  Shop around the outside edge of the grocery store. This includes fresh fruits and vegetables, bulk grains, fresh meats, and fresh dairy. Cooking  Use low-heat cooking methods, such as baking, instead of high-heat cooking methods like deep frying.  Cook using healthy oils, such as olive, canola, or sunflower oil.  Avoid cooking with butter, cream, or high-fat meats. Meal planning  Eat meals and snacks regularly, preferably at the same times every day. Avoid going long periods of time without eating.  Eat foods high in fiber, such as fresh fruits, vegetables, beans, and whole grains. Talk to your dietitian about how many servings of carbohydrates you can eat at each meal.  Eat 4-6 ounces of lean protein each day, such as lean meat, chicken, fish, eggs, or tofu. 1 ounce is equal to 1 ounce of meat, chicken, or fish, 1 egg, or 1/4 cup of tofu.  Eat some foods each day that contain healthy fats, such as avocado, nuts, seeds, and fish. Lifestyle   Check your blood glucose regularly.  Exercise at least 30 minutes 5 or more days each week, or as told by your health care provider.  Take medicines as told by your health care provider.  Do not use any products that contain nicotine or tobacco, such as cigarettes and e-cigarettes. If you need help quitting, ask your health care provider.  Work with a Social worker or diabetes educator to identify strategies to manage stress and any emotional and social challenges. What are some questions to ask my health care provider?  Do I need to meet with a diabetes educator?  Do I need to meet with a dietitian?  What number can I call if I have  questions?  When are the best times to check my blood glucose? Where to find more information:  American Diabetes Association: diabetes.org/food-and-fitness/food  Academy of Nutrition and Dietetics: PokerClues.dk  [employer] of Diabetes and Digestive and Kidney Diseases (NIH): ContactWire.be Summary  A healthy meal plan will help you control your blood glucose and maintain a healthy lifestyle.  Working with a diet and nutrition specialist (dietitian) can help you make a meal plan that is best for you.  Keep in mind that carbohydrates and alcohol have immediate effects on your blood glucose levels. It is important to count carbohydrates and to use alcohol carefully. This information is not intended to replace advice given to you by your health care provider. Make sure you discuss any questions you have with your health care provider. Document Released: 09/17/2004 Document Revised: 01/26/2016 Document Reviewed: 01/26/2016 Elsevier Interactive Patient Education  2018 [employer].    Chronic Kidney Disease, Adult Chronic kidney disease (CKD) happens when the kidneys are damaged during a time of 3 or more months. The kidneys are  two organs that do many important jobs in the body. These jobs include:  Removing wastes and extra fluids from the blood.  Making hormones that maintain the amount of fluid in your tissues and blood vessels.  Making sure that the body has the right amount of fluids and chemicals.  Most of the time, this condition does not go away, but it can usually be controlled. Steps must be taken to slow down the kidney damage or stop it from getting worse. Otherwise, the kidneys may stop working. Follow these instructions at home:  Follow your diet as told by your doctor. You may need to avoid alcohol, salty foods (sodium), and foods that are  high in potassium, calcium, and protein.  Take over-the-counter and prescription medicines only as told by your doctor. Do not take any new medicines unless your doctor says you can do that. These include vitamins and minerals. ? Medicines and nutritional supplements can make kidney damage worse. ? Your doctor may need to change how much medicine you take.  Do not use any tobacco products. These include cigarettes, chewing tobacco, and e-cigarettes. If you need help quitting, ask your doctor.  Keep all follow-up visits as told by your doctor. This is important.  Check your blood pressure. Tell your doctor if there are changes to your blood pressure.  Get to a healthy weight. Stay at that weight. If you need help with this, ask your doctor.  Start or continue an exercise plan. Try to exercise at least 30 minutes a day, 5 days a week.  Stay up-to-date with your shots (immunizations) as told by your doctor. Contact a doctor if:  Your symptoms get worse.  You have new symptoms. Get help right away if:  You have symptoms of end-stage kidney disease. These include: ? Headaches. ? Skin that is darker or lighter than normal. ? Numbness in your hands or feet. ? Easy bruising. ? Having hiccups often. ? Chest pain. ? Shortness of breath. ? Stopping of menstrual periods in women.  You have a fever.  You are making very little pee (urine).  You have pain or bleeding when you pee (urinate). This information is not intended to replace advice given to you by your health care provider. Make sure you discuss any questions you have with your health care provider. Document Released: 03/17/2009 Document Revised: 05/29/2015 Document Reviewed: 08/20/2011 Elsevier Interactive Patient Education  2017 [employer].

## 2017-09-17 LAB — COMPLETE METABOLIC PANEL WITH GFR
AG RATIO: 1.6 (calc) (ref 1.0–2.5)
ALKALINE PHOSPHATASE (APISO): 58 U/L (ref 40–115)
ALT: 26 U/L (ref 9–46)
AST: 22 U/L (ref 10–35)
Albumin: 4.2 g/dL (ref 3.6–5.1)
BUN: 18 mg/dL (ref 7–25)
CALCIUM: 9.5 mg/dL (ref 8.6–10.3)
CHLORIDE: 101 mmol/L (ref 98–110)
CO2: 23 mmol/L (ref 20–32)
Creat: 1.17 mg/dL (ref 0.70–1.25)
GFR, EST NON AFRICAN AMERICAN: 65 mL/min/{1.73_m2} (ref >=60)
GFR, Est African American: 76 mL/min/{1.73_m2} (ref >=60)
Globulin: 2.6 g/dL (calc) (ref 1.9–3.7)
Glucose, Bld: 104 mg/dL — ABNORMAL HIGH (ref 65–99)
POTASSIUM: 4.4 mmol/L (ref 3.5–5.3)
Sodium: 134 mmol/L — ABNORMAL LOW (ref 135–146)
Total Bilirubin: 0.4 mg/dL (ref 0.2–1.2)
Total Protein: 6.8 g/dL (ref 6.1–8.1)

## 2017-09-17 LAB — CBC WITH DIFFERENTIAL/PLATELET
BASOS PCT: 1.3 %
Basophils Absolute: 69 cells/uL (ref 0–200)
EOS ABS: 133 {cells}/uL (ref 15–500)
EOS PCT: 2.5 %
HCT: 35.9 % — ABNORMAL LOW (ref 38.5–50.0)
HEMOGLOBIN: 12.1 g/dL — AB (ref 13.2–17.1)
Lymphs Abs: 2353 cells/uL (ref 850–3900)
MCH: 30.8 pg (ref 27.0–33.0)
MCHC: 33.7 g/dL (ref 32.0–36.0)
MCV: 91.3 fL (ref 80.0–100.0)
MONOS PCT: 7.2 %
MPV: 10.4 fL (ref 7.5–12.5)
Neutro Abs: 2364 cells/uL (ref 1500–7800)
Neutrophils Relative %: 44.6 %
Platelets: 337 10*3/uL (ref 140–400)
RBC: 3.93 10*6/uL — AB (ref 4.20–5.80)
RDW: 12 % (ref 11.0–15.0)
TOTAL LYMPHOCYTE: 44.4 %
WBC mixed population: 382 cells/uL (ref 200–950)
WBC: 5.3 10*3/uL (ref 3.8–10.8)

## 2017-09-26 ENCOUNTER — Encounter: Payer: Self-pay | Admitting: *Deleted

## 2017-10-04 ENCOUNTER — Ambulatory Visit: Payer: Medicare Other | Admitting: Family Medicine

## 2017-10-04 ENCOUNTER — Encounter: Payer: Self-pay | Admitting: Family Medicine

## 2017-10-04 ENCOUNTER — Other Ambulatory Visit: Payer: Self-pay

## 2017-10-04 VITALS — BP 140/86 | HR 70 | Temp 98.1°F | Resp 16 | Ht 66.0 in | Wt 179.0 lb

## 2017-10-04 DIAGNOSIS — Z23 Encounter for immunization: Secondary | ICD-10-CM

## 2017-10-04 DIAGNOSIS — D649 Anemia, unspecified: Secondary | ICD-10-CM | POA: Diagnosis not present

## 2017-10-04 DIAGNOSIS — N179 Acute kidney failure, unspecified: Secondary | ICD-10-CM | POA: Diagnosis not present

## 2017-10-04 DIAGNOSIS — E1165 Type 2 diabetes mellitus with hyperglycemia: Secondary | ICD-10-CM | POA: Diagnosis not present

## 2017-10-04 DIAGNOSIS — R1013 Epigastric pain: Secondary | ICD-10-CM

## 2017-10-04 LAB — COMPLETE METABOLIC PANEL WITH GFR
AG Ratio: 1.7 (calc) (ref 1.0–2.5)
ALBUMIN MSPROF: 4.4 g/dL (ref 3.6–5.1)
ALT: 14 U/L (ref 9–46)
AST: 12 U/L (ref 10–35)
Alkaline phosphatase (APISO): 70 U/L (ref 40–115)
BILIRUBIN TOTAL: 0.4 mg/dL (ref 0.2–1.2)
BUN: 18 mg/dL (ref 7–25)
CALCIUM: 9.5 mg/dL (ref 8.6–10.3)
CO2: 23 mmol/L (ref 20–32)
CREATININE: 1 mg/dL (ref 0.70–1.25)
Chloride: 105 mmol/L (ref 98–110)
GFR, EST AFRICAN AMERICAN: 92 mL/min/{1.73_m2} (ref >=60)
GFR, EST NON AFRICAN AMERICAN: 79 mL/min/{1.73_m2} (ref >=60)
GLUCOSE: 139 mg/dL — AB (ref 65–99)
Globulin: 2.6 g/dL (calc) (ref 1.9–3.7)
Potassium: 4.9 mmol/L (ref 3.5–5.3)
Sodium: 139 mmol/L (ref 135–146)
TOTAL PROTEIN: 7 g/dL (ref 6.1–8.1)

## 2017-10-04 LAB — CBC WITH DIFFERENTIAL/PLATELET
BASOS ABS: 59 {cells}/uL (ref 0–200)
Basophils Relative: 1.4 %
EOS PCT: 3.3 %
Eosinophils Absolute: 139 cells/uL (ref 15–500)
HCT: 38.3 % — ABNORMAL LOW (ref 38.5–50.0)
HEMOGLOBIN: 12.9 g/dL — AB (ref 13.2–17.1)
Lymphs Abs: 1831 cells/uL (ref 850–3900)
MCH: 31.4 pg (ref 27.0–33.0)
MCHC: 33.7 g/dL (ref 32.0–36.0)
MCV: 93.2 fL (ref 80.0–100.0)
MONOS PCT: 8.7 %
MPV: 10.3 fL (ref 7.5–12.5)
Neutro Abs: 1806 cells/uL (ref 1500–7800)
Neutrophils Relative %: 43 %
Platelets: 254 10*3/uL (ref 140–400)
RBC: 4.11 10*6/uL — ABNORMAL LOW (ref 4.20–5.80)
RDW: 12 % (ref 11.0–15.0)
Total Lymphocyte: 43.6 %
WBC mixed population: 365 cells/uL (ref 200–950)
WBC: 4.2 10*3/uL (ref 3.8–10.8)

## 2017-10-04 LAB — LIPID PANEL
CHOL/HDL RATIO: 2.6 (calc) (ref <5.0)
Cholesterol: 105 mg/dL (ref <200)
HDL: 40 mg/dL — AB (ref >40)
LDL CHOLESTEROL (CALC): 52 mg/dL
NON-HDL CHOLESTEROL (CALC): 65 mg/dL (ref <130)
Triglycerides: 58 mg/dL (ref <150)

## 2017-10-04 LAB — LIPASE: Lipase: 88 U/L — ABNORMAL HIGH (ref 7–60)

## 2017-10-04 MED ORDER — PANTOPRAZOLE SODIUM 40 MG PO TBEC: 40.0000 mg | DELAYED_RELEASE_TABLET | Freq: Every day | ORAL | 3 refills | Status: DC

## 2017-10-04 MED ORDER — ATORVASTATIN CALCIUM 40 MG PO TABS: 40.0000 mg | ORAL_TABLET | Freq: Every day | ORAL | 2 refills | Status: DC

## 2017-10-04 MED ORDER — METFORMIN HCL 1000 MG PO TABS: 1000.0000 mg | ORAL_TABLET | Freq: Two times a day (BID) | ORAL | 2 refills | Status: DC

## 2017-10-04 NOTE — Patient Instructions (Addendum)
Keep taking metformin Keep taking glipizide with meals. I will call you with any other medication changes - will discuss when we call you for labs  Start your blood pressure medication - lisinopril Do a trial of protonix - it is an acid blocking medicine, take in the morning on an empty stomach - try for 2 weeks to see if your stomach feels better   Will need to do repeated diabetes labs in 2.5 months and follow up   Please contact me if your fasting sugars are over 160's   Continue healthy diet and exercise

## 2017-10-04 NOTE — Progress Notes (Signed)
  Patient ID: Cameron Glover, male    DOB: 11/11/1950, 66 y.o.   MRN: [id number]  PCP: , , PA-C  Chief Complaint  Patient presents with  . Follow-up    is fasting    Subjective:   Cameron Glover is a 66 y.o. male, presents to clinic with CC of DM, uncontrolled, first OV 2 weeks ago after ER visit.  Diabetes Mellitus Type II, Follow-up:   Lab Results  Component Value Date   HGBA1C 13.6 (H) 09/03/2017   Last seen for diabetes 2 weeks ago.  Management since then includes metformin and glipizide. He reports good compliance with treatment. He is not having side effects from meds, but does not feel all the way back to normal since before going to the ER.  Current symptoms include hyperglycemia feels bad woozy, paresthesia of the feet, polydipsia and visual disturbances and have been improving. Home blood sugar records:140-220's Episodes of hypoglycemia? no Weight trend: increasing steadily Prior visit with dietician: no Current diet: in general, a "healthy" diet    Lipid/Cholesterol, Follow-up:   Management since that visit includes atorvastatin 40 mg no SE  Last Lipid Panel:  - none available - due for labs    He reports excellent compliance with treatment. He is not having side effects.   Wt Readings from Last 3 Encounters:  10/04/17 179 lb (81.2 kg)  09/16/17 177 lb (80.3 kg)  09/03/17 163 lb 5.8 oz (74.1 kg)   Stop drinking the soda and sugary drinks, more water or diet drinks Diet - tried to avoid greasy foods,   Had has two episdes of vomiting, indigestion, some gas, not sure if he had some CP or gas - points to his epigastrum, it resolved on its own, he was playing bingo with friends, felt like something would come up     Prior to Admission medications   Medication Sig Start Date End Date Taking? Authorizing Provider  atorvastatin (LIPITOR) 40 MG tablet Take 40 mg by mouth daily.   Yes [provider]  blood glucose meter kit and supplies  KIT Dispense based on patient and insurance preference. Use up to four times daily as directed. (FOR ICD-9 250.00, 250.01). 09/04/17  Yes Danford, Christopher P, MD  glipiZIDE (GLUCOTROL) 5 MG tablet Take 0.5 tablets (2.5 mg total) by mouth 2 (two) times daily before a meal. 09/04/17 09/04/18 Yes Danford, Christopher P, MD  metFORMIN (GLUCOPHAGE) 1000 MG tablet Take 1,000 mg by mouth 2 (two) times daily with a meal.   Yes [provider]    No Known Allergies  Review of Systems  Constitutional: Negative.   HENT: Negative.   Eyes: Negative.   Respiratory: Negative.   Cardiovascular: Negative.   Gastrointestinal: Negative.   Endocrine: Negative.   Genitourinary: Negative.   Musculoskeletal: Negative.   Skin: Negative.   Allergic/Immunologic: Negative.   Neurological: Negative.   Hematological: Negative.   Psychiatric/Behavioral: Negative.   All other systems reviewed and are negative.      Objective:    Vitals:   10/04/17 0847  BP: 140/86  Pulse: 70  Resp: 16  Temp: 98.1 F (36.7 C)  TempSrc: Oral  SpO2: 99%  Weight: 179 lb (81.2 kg)  Height: 5' 6" (1.676 m)      Physical Exam  Constitutional: He appears well-developed and well-nourished. No distress.  HENT:  Head: Normocephalic and atraumatic.  Left Ear: External ear normal.  Nose: Nose normal.  Mouth/Throat: Oropharynx is clear and moist.  Eyes:   Conjunctivae are normal. Right eye exhibits no discharge. Left eye exhibits no discharge.  Neck: Normal range of motion. No tracheal deviation present.  Cardiovascular: Normal rate, regular rhythm, normal heart sounds and intact distal pulses. Exam reveals no gallop and no friction rub.  No murmur heard. Pulmonary/Chest: Effort normal and breath sounds normal. No stridor. No respiratory distress. He has no wheezes. He has no rales.  Abdominal: Soft. Bowel sounds are normal. He exhibits no distension and no mass. There is no tenderness. There is no guarding.    Musculoskeletal: Normal range of motion.  Neurological: He is alert. He exhibits normal muscle tone. Coordination normal.  Skin: Skin is warm and dry. No rash noted. He is not diaphoretic.  Psychiatric: He has a normal mood and affect. His behavior is normal.  Nursing note and vitals reviewed.         Assessment & Plan:      ICD-10-CM   1. Uncontrolled type 2 diabetes mellitus with hyperglycemia (HCC) E11.65 COMPLETE METABOLIC PANEL WITH GFR    Lipase    Lipid panel    atorvastatin (LIPITOR) 40 MG tablet    metFORMIN (GLUCOPHAGE) 1000 MG tablet  2. AKI (acute kidney injury) (HCC) N17.9   3. Anemia, unspecified type D64.9 CBC with Differential/Platelet  4. Epigastric pain R10.13 COMPLETE METABOLIC PANEL WITH GFR    Lipase    pantoprazole (PROTONIX) 40 MG tablet  5. Need for immunization against influenza Z23 Flu Vaccine QUAD 36+ mos IM    Recheck of DM, pt still relatively new pt came to clinic shortly after hospital admission.   DM uncontrolled A1C 09/03/17 13.6.  Slowly trying to get meter, medications and pt initially had no insurance, so he did not get insulin prescribed for him.   He still has some intermittent upper GI sx, with nausea, only a few episodes of vomiting noted from pt.  Will try PPI and obtain labs, CMP + lipase.  Pt's labs after fluid resuscitation in hospital showed some anemia, will recheck CBC, recheck renal function after recent AKI     , PA-C 10/04/17 9:08 AM  

## 2018-02-27 ENCOUNTER — Encounter: Payer: Self-pay | Admitting: Family Medicine

## 2018-03-23 ENCOUNTER — Other Ambulatory Visit: Payer: Self-pay

## 2018-03-23 DIAGNOSIS — R1013 Epigastric pain: Secondary | ICD-10-CM

## 2018-03-23 MED ORDER — PANTOPRAZOLE SODIUM 40 MG PO TBEC: 40.0000 mg | DELAYED_RELEASE_TABLET | Freq: Every day | ORAL | 3 refills | Status: DC

## 2018-04-12 ENCOUNTER — Ambulatory Visit (INDEPENDENT_AMBULATORY_CARE_PROVIDER_SITE_OTHER): Payer: Medicare Other | Admitting: Family Medicine

## 2018-04-12 ENCOUNTER — Other Ambulatory Visit: Payer: Self-pay

## 2018-04-12 VITALS — BP 132/80 | HR 100 | Temp 98.7°F | Resp 18 | Ht 64.0 in | Wt 177.8 lb

## 2018-04-12 DIAGNOSIS — E1165 Type 2 diabetes mellitus with hyperglycemia: Secondary | ICD-10-CM | POA: Diagnosis not present

## 2018-04-12 DIAGNOSIS — D631 Anemia in chronic kidney disease: Secondary | ICD-10-CM

## 2018-04-12 DIAGNOSIS — N189 Chronic kidney disease, unspecified: Secondary | ICD-10-CM

## 2018-04-12 DIAGNOSIS — I10 Essential (primary) hypertension: Secondary | ICD-10-CM | POA: Diagnosis not present

## 2018-04-12 DIAGNOSIS — E118 Type 2 diabetes mellitus with unspecified complications: Secondary | ICD-10-CM | POA: Diagnosis not present

## 2018-04-12 DIAGNOSIS — Z1159 Encounter for screening for other viral diseases: Secondary | ICD-10-CM

## 2018-04-12 DIAGNOSIS — R748 Abnormal levels of other serum enzymes: Secondary | ICD-10-CM

## 2018-04-12 MED ORDER — INSULIN PEN NEEDLE 32G X 4 MM MISC: 1 refills | Status: DC

## 2018-04-12 MED ORDER — BASAGLAR KWIKPEN 100 UNIT/ML ~~LOC~~ SOPN: 15.0000 [IU] | PEN_INJECTOR | Freq: Every day | SUBCUTANEOUS | 1 refills | Status: DC

## 2018-04-12 NOTE — Progress Notes (Signed)
Patient ID: Cameron Glover, male    DOB: October 10, 1950, 68 y.o.   MRN: [id number]  PCP: [name], PA-C  Chief Complaint  Patient presents with  . Diabetes  . Hypertension    Subjective:   Cameron Glover is a 68 y.o. male, presents to clinic with CC of f/up for DM and abnormal labs.   First established in Sept 2019 after admission to the hospital, he was dx with DM, then established care here in clinic, and was subsequently lost to follow up.  DM - still doing metformin and Glipizide - sugars are usually in the 200's (he notes range of 190-230, with 190 as "low") no meter with him to sugar log.   No SE of meds, no lows, vision okay  HLD - taking statin, no SE  Intermittent abdominal pain, generalized central to left abdomen, comes and goes, a "little pain" described as cramping, no throwing up or diarrhea  Anemia - gets a little SOB if he "walks to much"  Weight - he reports that he has "lost a little more weight" Per our record of weights here and in ER, stable weight for the past 6 months or so.  Wt Readings from Last 5 Encounters:  04/12/18 177 lb 12.8 oz (80.6 kg)  10/04/17 179 lb (81.2 kg)  09/16/17 177 lb (80.3 kg)  09/03/17 163 lb 5.8 oz (74.1 kg)   Most recent labs were done 10/2017- had elevated lipase and mild anemia, renal function had continued to improve since his hospitalization.    He is overdue for repeat labs, A1C, fasting lipids, foot exam. New pt, uncontrolled dx of DM, ER prescribed him insulin but he never got, continued sulfonurea, but my intention was to change to other class of med, however he unfortunately never returned for his recheck.  IS fasting today.  Patient Active Problem List   Diagnosis Date Noted  . Acute renal failure (ARF) ([place])   . Hyperglycemia without ketosis   . Hyperkalemia 09/02/2017  . DM (diabetes mellitus) with complications ([place]) 40/34/7425  . AKI (acute kidney injury) ([place]) 09/02/2017  . Diarrhea 09/02/2017  .  Dehydration 09/02/2017    Current Meds  Medication Sig  . atorvastatin (LIPITOR) 40 MG tablet Take 1 tablet (40 mg total) by mouth daily.  . blood glucose meter kit and supplies KIT Dispense based on patient and insurance preference. Use up to four times daily as directed. (FOR ICD-9 250.00, 250.01).  Marland Kitchen glipiZIDE (GLUCOTROL) 5 MG tablet Take 0.5 tablets (2.5 mg total) by mouth 2 (two) times daily before a meal.  . metFORMIN (GLUCOPHAGE) 1000 MG tablet Take 1 tablet (1,000 mg total) by mouth 2 (two) times daily with a meal.  . pantoprazole (PROTONIX) 40 MG tablet Take 1 tablet (40 mg total) by mouth daily.     Review of Systems  Constitutional: Negative.   HENT: Negative.   Eyes: Negative.   Respiratory: Negative.   Cardiovascular: Negative.   Gastrointestinal: Negative.   Endocrine: Negative.   Genitourinary: Negative.   Musculoskeletal: Negative.   Skin: Negative.   Allergic/Immunologic: Negative.   Neurological: Negative.   Hematological: Negative.   Psychiatric/Behavioral: Negative.   All other systems reviewed and are negative.      Objective:    Vitals:   04/12/18 0912  BP: 132/80  Pulse: 100  Resp: 18  Temp: 98.7 F (37.1 C)  SpO2: 98%  Weight: 177 lb 12.8 oz (80.6 kg)  Height: '5\' 4"'  (1.626 m)  Physical Exam Vitals signs and nursing note reviewed.  Constitutional:      General: He is not in acute distress.    Appearance: Normal appearance. He is well-developed. He is not ill-appearing, toxic-appearing or diaphoretic.  HENT:     Head: Normocephalic and atraumatic.     Jaw: No trismus.     Right Ear: Tympanic membrane, ear canal and external ear normal.     Left Ear: Tympanic membrane, ear canal and external ear normal.     Nose: Nose normal. No mucosal edema or rhinorrhea.     Right Sinus: No maxillary sinus tenderness or frontal sinus tenderness.     Left Sinus: No maxillary sinus tenderness or frontal sinus tenderness.     Mouth/Throat:      Mouth: Mucous membranes are moist.     Pharynx: Oropharynx is clear. Uvula midline. No oropharyngeal exudate, posterior oropharyngeal erythema or uvula swelling.  Eyes:     General: Lids are normal. No scleral icterus.       Right eye: No discharge.        Left eye: No discharge.     Conjunctiva/sclera: Conjunctivae normal.     Pupils: Pupils are equal, round, and reactive to light.  Neck:     Musculoskeletal: Normal range of motion and neck supple.     Trachea: Trachea and phonation normal. No tracheal deviation.  Cardiovascular:     Rate and Rhythm: Normal rate and regular rhythm.     Pulses: Normal pulses.          Radial pulses are 2+ on the right side and 2+ on the left side.       Posterior tibial pulses are 2+ on the right side and 2+ on the left side.     Heart sounds: Normal heart sounds. No murmur. No friction rub. No gallop.   Pulmonary:     Effort: Pulmonary effort is normal. No respiratory distress.     Breath sounds: Normal breath sounds. No stridor. No wheezing, rhonchi or rales.  Abdominal:     General: Bowel sounds are normal. There is no distension.     Palpations: Abdomen is soft.     Tenderness: There is no abdominal tenderness. There is no guarding or rebound.  Musculoskeletal: Normal range of motion.     Right lower leg: No edema.     Left lower leg: No edema.  Skin:    General: Skin is warm and dry.     Capillary Refill: Capillary refill takes less than 2 seconds.     Coloration: Skin is not jaundiced.     Findings: No rash.  Neurological:     Mental Status: He is alert and oriented to person, place, and time.     Gait: Gait normal.  Psychiatric:        Mood and Affect: Mood normal.        Speech: Speech normal.        Behavior: Behavior normal.    DM foot exam completed - see chart      Assessment & Plan:   68 y/o male here for f/up on uncontrolled DM, was lost to follow up after establishing here 09/2017.     Problem List Items Addressed This  Visit      Cardiovascular and Mediastinum   Hypertension    BP at goal here today w/o meds Will need ACEI for renal protection with DM      Relevant Orders   COMPLETE METABOLIC  PANEL WITH GFR (Completed)     Endocrine   DM (diabetes mellitus) with complications ([place]) - Primary    Not yet controlled D/C glipizide Start Basaglar nightly - start at 20 units (pt educated) Continue metformin Continue monitoring - will not have him adjust, will do telephone f/up next week. Handouts given Foot exam done, A1C, CMP, fasting lipids done today  Pt reports compliance with statin Will need to add ACEI once I recheck renal function      Relevant Medications   Insulin Glargine (BASAGLAR KWIKPEN) 100 UNIT/ML SOPN   Uncontrolled type 2 diabetes mellitus with hyperglycemia (HCC)   Relevant Medications   Insulin Glargine (BASAGLAR KWIKPEN) 100 UNIT/ML SOPN   Other Relevant Orders   CBC with Differential/Platelet (Completed)   COMPLETE METABOLIC PANEL WITH GFR (Completed)   Hemoglobin A1c (Completed)   Lipid panel (Completed)   Microalbumin, urine (Completed)   Ambulatory referral to Ophthalmology     Other   Anemia due to chronic kidney disease    Recheck CBC - anemia with hospitalization, no meds or tx currently, wait for labs to determine need for work up or tx      Relevant Orders   CBC with Differential/Platelet (Completed)   Elevated lipase    Elevated Lipase with last labs, no current abd pain, recheck      Relevant Orders   Lipase (Completed)    Other Visit Diagnoses    Need for hepatitis C screening test       labs today - HM checked off   Relevant Orders   Hepatitis C antibody (Completed)      Spoke with pt about DM diagnosis, still uncontrolled, urged importance of close f/up to help get it controlled.  Discussed A1C results with them and explained what an A1C is, basic pathophysiology of DM Type 2, basic home care, basic diabetes diet nutrition principles,  importance of checking CBGs and maintaining good CBG control to prevent long-term and short-term complications.  Reviewed signs and symptoms of hyperglycemia and hypoglycemia and how to treat hypoglycemia at home.  Also reviewed blood sugar goals and A1c goals for home.    CMA also educated pt about insulin pen, gave first injection here, handouts given with extra information about insulin pen, use, etc.  All questions asked and answered.  Next dose tomorrow night, pt knows to hold glipizide and continue metformin.  Close f/up next week to check CBG's and adjust.   [name], PA-C 04/12/18 9:21 AM

## 2018-04-12 NOTE — Patient Instructions (Signed)
I will call you with labs  Keep taking metformin morning and night Take lipitor at night  STOP taking glipizide  Start giving insulin shot 20 units at night   Keep track of morning blood sugars before you eat, and I will call you in one week to review the sugars and adjust the dose if needed.   Insulin Injection Instructions, Using Insulin Pens, Adult A subcutaneous injection is a shot of medicine that is injected into the layer of fat and tissue between skin and muscle. People with type 1 diabetes must take insulin because their bodies do not make it. People with type 2 diabetes may need to take insulin. There are many different types of insulin. The type of insulin that you take may determine how many injections you give yourself and when you need to give the injections. Supplies needed:  Soap and water to wash hands.  Your insulin pen.  A new, unused needle.  Alcohol wipes.  A disposal container that is meant for sharp items (sharps container), such as an empty plastic bottle with a cover. How to choose a site for injection The body absorbs insulin differently, depending on where the insulin is injected (injection site). It is best to inject insulin into the same body area each time (for example, always in the abdomen), but you should use a different spot in that area for each injection. Do not inject the insulin in the same spot each time. There are five main areas that can be used for injecting. These areas include:  Abdomen. This is the preferred area.  Front of thigh.  Upper, outer side of thigh.  Upper, outer side of arm.  Upper, outer part of buttock. How to use an insulin pen  First, follow the steps for Get ready, then continue with the steps for Inject the insulin. Get ready 1. Wash your hands with soap and water. If soap and water are not available, use hand sanitizer. 2. Before you give yourself an insulin injection, be sure to test your blood sugar level  (blood glucose level) and write down that number. Follow any instructions from your health care provider about what to do if your blood glucose level is higher or lower than your normal range. 3. Check the expiration date and the type of insulin that is in the pen. 4. If you are using CLEAR insulin, check to see that it is clear and free of clumps. 5. If you are using CLOUDY insulin, do not shake the pen to get the injection ready. Instead, get it ready in one of these ways: ? Gently roll the pen between your palms several times. ? Tip the pen up and down several times. 6. Remove the cap from the insulin pen. 7. Use an alcohol wipe to clean the rubber tip of the pen. 8. Remove the protective paper tab from the disposable needle. Do not let the needle touch anything. 9. Screw a new, unused needle onto the pen. 10. Remove the outer plastic needle cover. Do not throw away the outer plastic cover yet. ? If the pen uses a special safety needle, leave the inner needle shield in place. ? If the pen does not use a special safety needle, remove the inner plastic cover from the needle. 11. Follow the manufacturer's instructions to prime the insulin pen with the volume of insulin needed. Hold the pen with the needle pointing up, and push the button on the opposite end of the pen until a  drop of insulin appears at the needle tip. If no insulin appears, repeat this step. 12. Turn the button (dial) to the number of units of insulin that you will be injecting. Inject the insulin 1. Use an alcohol wipe to clean the site where you will be injecting the needle. Let the site air-dry. 2. Hold the pen in the palm of your writing hand like a pencil. 3. If directed by your health care provider, use your other hand to pinch and hold about an inch (2.5 cm) of skin at the injection site. Do not directly touch the cleaned part of the skin. 4. Gently but quickly, use your writing hand to put the needle straight into the  skin. The needle should be at a 90-degree angle (perpendicular) to the skin. 5. When the needle is completely inserted into the skin, use your thumb or index finger of your writing hand to push the top button of the pen down all the way to inject the insulin. 6. Let go of the skin that you are pinching. Continue to hold the pen in place with your writing hand. 7. Wait 10 seconds, then pull the needle straight out of the skin. This will allow all of the insulin to go from the pen and needle into your body. 8. Carefully put the larger (outer) plastic cover of the needle back over the needle, then unscrew the capped needle and discard it in a sharps container, such as an empty plastic bottle with a cover. 9. Put the plastic cap back on the insulin pen. How to throw away supplies  Discard all used needles in a puncture-proof sharps disposal container. You can ask your local pharmacy about where you can get this kind of disposal container, or you can use an empty plastic liquid laundry detergent bottle that has a cover.  Follow the disposal regulations for the area where you live. Do not use any needle more than one time.  Throw away empty disposable pens in the regular trash. Questions to ask your health care provider  How often should I be taking insulin?  How often should I check my blood glucose?  What amount of insulin should I be taking at each time?  What are the side effects?  What should I do if my blood glucose is too high?  What should I do if my blood glucose is too low?  What should I do if I forget to take my insulin?  What number should I call if I have questions? Where to find more information  American Diabetes Association (ADA): www.diabetes.org  American Association of Diabetes Educators (AADE) Patient Resources: https://www.diabeteseducator.org Summary  A subcutaneous injection is a shot of medicine that is injected into the layer of fat and tissue between skin and  muscle.  Before you give yourself an insulin injection, be sure to test your blood sugar level (blood glucose level) and write down that number.  Check the expiration date and the type of insulin that is in the pen. The type of insulin that you take may determine how many injections you give yourself and when you need to give the injections.  It is best to inject insulin into the same body area each time (for example, always in the abdomen), but you should use a different spot in that area for each injection. This information is not intended to replace advice given to you by your health care provider. Make sure you discuss any questions you have with your  health care provider. Document Released: 01/24/2015 Document Revised: 01/10/2017 Document Reviewed: 01/24/2015 Elsevier Interactive Patient Education  2019 [employer].

## 2018-04-13 ENCOUNTER — Other Ambulatory Visit: Payer: Self-pay | Admitting: Family Medicine

## 2018-04-13 DIAGNOSIS — E875 Hyperkalemia: Secondary | ICD-10-CM

## 2018-04-13 DIAGNOSIS — N189 Chronic kidney disease, unspecified: Secondary | ICD-10-CM

## 2018-04-13 LAB — COMPLETE METABOLIC PANEL WITH GFR
AG Ratio: 1.6 (calc) (ref 1.0–2.5)
ALT: 13 U/L (ref 9–46)
AST: 14 U/L (ref 10–35)
Albumin: 4.5 g/dL (ref 3.6–5.1)
Alkaline phosphatase (APISO): 87 U/L (ref 35–144)
BUN/Creatinine Ratio: 15 (calc) (ref 6–22)
BUN: 20 mg/dL (ref 7–25)
CO2: 25 mmol/L (ref 20–32)
Calcium: 10.1 mg/dL (ref 8.6–10.3)
Chloride: 106 mmol/L (ref 98–110)
Creat: 1.33 mg/dL — ABNORMAL HIGH (ref 0.70–1.25)
GFR, Est African American: 65 mL/min/{1.73_m2} (ref >=60)
GFR, Est Non African American: 56 mL/min/{1.73_m2} — ABNORMAL LOW (ref >=60)
Globulin: 2.9 g/dL (calc) (ref 1.9–3.7)
Glucose, Bld: 224 mg/dL — ABNORMAL HIGH (ref 65–99)
Potassium: 5.9 mmol/L — ABNORMAL HIGH (ref 3.5–5.3)
Sodium: 141 mmol/L (ref 135–146)
Total Bilirubin: 0.5 mg/dL (ref 0.2–1.2)
Total Protein: 7.4 g/dL (ref 6.1–8.1)

## 2018-04-13 LAB — HEPATITIS C ANTIBODY
Hepatitis C Ab: NONREACTIVE
SIGNAL TO CUT-OFF: 0.12 (ref <1.00)

## 2018-04-13 LAB — CBC WITH DIFFERENTIAL/PLATELET
Absolute Monocytes: 410 cells/uL (ref 200–950)
Basophils Absolute: 50 cells/uL (ref 0–200)
Basophils Relative: 1 %
Eosinophils Absolute: 210 cells/uL (ref 15–500)
Eosinophils Relative: 4.2 %
HCT: 42.7 % (ref 38.5–50.0)
Hemoglobin: 14.2 g/dL (ref 13.2–17.1)
Lymphs Abs: 2015 cells/uL (ref 850–3900)
MCH: 31.7 pg (ref 27.0–33.0)
MCHC: 33.3 g/dL (ref 32.0–36.0)
MCV: 95.3 fL (ref 80.0–100.0)
MPV: 11.3 fL (ref 7.5–12.5)
Monocytes Relative: 8.2 %
Neutro Abs: 2315 cells/uL (ref 1500–7800)
Neutrophils Relative %: 46.3 %
Platelets: 259 10*3/uL (ref 140–400)
RBC: 4.48 10*6/uL (ref 4.20–5.80)
RDW: 11.6 % (ref 11.0–15.0)
Total Lymphocyte: 40.3 %
WBC: 5 10*3/uL (ref 3.8–10.8)

## 2018-04-13 LAB — HEMOGLOBIN A1C
Hgb A1c MFr Bld: 8.5 % of total Hgb — ABNORMAL HIGH (ref <5.7)
Mean Plasma Glucose: 197 (calc)
eAG (mmol/L): 10.9 (calc)

## 2018-04-13 LAB — MICROALBUMIN, URINE: Microalb, Ur: 108.3 mg/dL

## 2018-04-13 LAB — LIPASE: Lipase: 13 U/L (ref 7–60)

## 2018-04-13 LAB — LIPID PANEL
Cholesterol: 106 mg/dL (ref <200)
HDL: 32 mg/dL — ABNORMAL LOW (ref >=40)
LDL Cholesterol (Calc): 55 mg/dL (calc)
Non-HDL Cholesterol (Calc): 74 mg/dL (calc) (ref <130)
Total CHOL/HDL Ratio: 3.3 (calc) (ref <5.0)
Triglycerides: 109 mg/dL (ref <150)

## 2018-04-13 NOTE — Progress Notes (Signed)
Pt with increased sCr since last labs also hyperkalemia - recheck at outside lab (recheck for lab error/hemolysis causing elevated K, and to avoid clinic with [facility]) [place] to have him recheck in the next 2 weeks Will be closely following up in clinic in the next month due to new pt, new DM dx and DM med changes started basal insulin.  Pt notified re: labs and mail lab requisitions

## 2018-04-15 ENCOUNTER — Encounter: Payer: Self-pay | Admitting: Family Medicine

## 2018-04-15 DIAGNOSIS — I10 Essential (primary) hypertension: Secondary | ICD-10-CM | POA: Insufficient documentation

## 2018-04-15 DIAGNOSIS — D631 Anemia in chronic kidney disease: Secondary | ICD-10-CM | POA: Insufficient documentation

## 2018-04-15 DIAGNOSIS — R748 Abnormal levels of other serum enzymes: Secondary | ICD-10-CM | POA: Insufficient documentation

## 2018-04-15 DIAGNOSIS — N189 Chronic kidney disease, unspecified: Secondary | ICD-10-CM

## 2018-04-15 DIAGNOSIS — E1165 Type 2 diabetes mellitus with hyperglycemia: Secondary | ICD-10-CM | POA: Insufficient documentation

## 2018-04-15 HISTORY — DX: Chronic kidney disease, unspecified: D63.1

## 2018-04-15 HISTORY — DX: Abnormal levels of other serum enzymes: R74.8

## 2018-04-15 HISTORY — DX: Anemia in chronic kidney disease: N18.9

## 2018-04-15 MED ORDER — LISINOPRIL 2.5 MG PO TABS: 2.5000 mg | ORAL_TABLET | Freq: Every day | ORAL | 1 refills | Status: DC

## 2018-04-15 NOTE — Assessment & Plan Note (Signed)
BP at goal here today w/o meds Will need ACEI for renal protection with DM

## 2018-04-15 NOTE — Assessment & Plan Note (Signed)
Elevated Lipase with last labs, no current abd pain, recheck

## 2018-04-15 NOTE — Assessment & Plan Note (Signed)
Recheck CBC - anemia with hospitalization, no meds or tx currently, wait for labs to determine need for work up or tx

## 2018-04-15 NOTE — Assessment & Plan Note (Signed)
Not yet controlled D/C glipizide Start Basaglar nightly - start at 20 units (pt educated) Continue metformin Continue monitoring - will not have him adjust, will do telephone f/up next week. Handouts given Foot exam done, A1C, CMP, fasting lipids done today  Pt reports compliance with statin Will need to add ACEI once I recheck renal function

## 2018-04-20 ENCOUNTER — Other Ambulatory Visit: Payer: Self-pay

## 2018-04-20 ENCOUNTER — Ambulatory Visit (INDEPENDENT_AMBULATORY_CARE_PROVIDER_SITE_OTHER): Payer: Medicare Other | Admitting: Family Medicine

## 2018-04-20 ENCOUNTER — Encounter: Payer: Self-pay | Admitting: Family Medicine

## 2018-04-20 DIAGNOSIS — E118 Type 2 diabetes mellitus with unspecified complications: Secondary | ICD-10-CM | POA: Diagnosis not present

## 2018-04-20 NOTE — Progress Notes (Signed)
Virtual Visit via Telephone Note  Phone visit arranged with@ for 04/20/18 at  9:15 AM EDT  Services provided today were via telemedicine through telephone call. Start of phone call:  9:41 AM  I verified that I was speaking with the correct person using two identifiers. Patient reported their location during encounter was in the grocery store  Patient consented to telephone visit  I conducted telephone visit from [place] [facility]  Referring Provider:   [name], PA-C   All participants in encounter:  Myself and patient I discussed the limitations, risks, security and privacy concerns of performing an evaluation and management service by telephone and the availability of in person appointments. I also discussed with the patient that there may be a patient responsible charge related to this service. The patient expressed understanding and agreed to proceed.   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 Since he is in the grocery store he will call back later today with his sugar log to read me more fasting CBG and dates.   Observations/Objective: Pt did come by this morning inadvertently due to automated phone call to remind pt about appts - it told him to come at 9 am, but he thought we decided to do a telephone visit, I did speak with him as he was leaving the clinic. Physical Exam Constitutional:      General: He is not in acute distress.    Appearance: Normal appearance. He is normal weight. He is not ill-appearing, toxic-appearing or diaphoretic.  HENT:     Head: Normocephalic and atraumatic.     Right Ear: External ear normal.     Left Ear: External ear normal.     Nose: Nose normal.  Eyes:     General: No scleral icterus.       Right eye: No  discharge.        Left eye: No discharge.  Pulmonary:     Effort: Pulmonary effort is normal. No respiratory distress.  Musculoskeletal: Normal range of motion.  Skin:    Coloration: Skin is not jaundiced or pale.     Findings: No erythema.  Neurological:     Mental Status: He is alert.     Coordination: Coordination normal.     Gait: Gait normal.  Psychiatric:        Mood and Affect: Mood normal.        Behavior: Behavior normal.      Assessment and Plan: IDDM -  Increase insuline to 12 units reviewed further dose titration briefly If fasting CBG on average over the next week are still >~150, then will need another dose increase +2-3 units and wait 3-7 days before adjusting again.  Will f/up again by phone in about 2 weeks.   Follow Up Instructions: 2 week f/up phone or mychart to review sugars   I discussed the assessment and treatment plan with the patient. The patient was provided an opportunity to ask questions and all were answered. The patient agreed with the plan and demonstrated an understanding of the instructions.   The patient was advised to call back or seek an in-person evaluation if the symptoms worsen or if the condition fails to improve as anticipated.    Phone call concluded at 9:46 am  I provided 5 minutes of non-face-to-face time during this encounter.   [name], PA-C

## 2018-04-20 NOTE — Assessment & Plan Note (Addendum)
Per reported fasting sugars, not yet controlled on 10 units Increase insulin to 12 units daily Titrate by 2-3 unit + if fasting sugar > 150, wait more than 3d after dose increase before changing again  2 week f/up  Doing well with insulin pen, no sx, no SE, no hypoglycemia

## 2018-04-27 ENCOUNTER — Other Ambulatory Visit: Payer: Self-pay

## 2018-05-25 LAB — HM DIABETES EYE EXAM

## 2018-06-17 ENCOUNTER — Other Ambulatory Visit: Payer: Self-pay | Admitting: Family Medicine

## 2018-06-17 DIAGNOSIS — E1165 Type 2 diabetes mellitus with hyperglycemia: Secondary | ICD-10-CM

## 2018-07-10 ENCOUNTER — Encounter: Payer: Self-pay | Admitting: *Deleted

## 2018-08-30 ENCOUNTER — Other Ambulatory Visit: Payer: Self-pay | Admitting: Family Medicine

## 2018-08-30 DIAGNOSIS — R1013 Epigastric pain: Secondary | ICD-10-CM

## 2018-09-15 ENCOUNTER — Telehealth: Payer: Self-pay | Admitting: Family Medicine

## 2018-09-15 ENCOUNTER — Encounter: Payer: Self-pay | Admitting: Family Medicine

## 2018-09-15 NOTE — Telephone Encounter (Signed)
-----   Message from Justin Mend, MD sent at 09/15/2018  9:19 AM EDT ----- Regarding: insulin use Hi Kristeen Miss -  I saw Nasier Closs in nephrology clinic and in speaking with him he's not on insulin therapy -- seems he misunderstood directions and hasn't been on it since 04/2018.  Per his last A1c it looks like he does need it, can you follow up with him to reinitiate?    We'll send my notes and labs over from today when they result.  Feel free to reach out with any concerns.   Thanks Jannifer Hick [facility])

## 2018-09-18 NOTE — Telephone Encounter (Signed)
Patient was not sure if he wanted to drive that far since he lives in [place], but he made an appt for this Friday to try it and see if he thinks he wants to come here or stay in [facility].

## 2018-09-22 ENCOUNTER — Encounter: Payer: Self-pay | Admitting: Family Medicine

## 2018-09-22 ENCOUNTER — Other Ambulatory Visit: Payer: Self-pay

## 2018-09-22 ENCOUNTER — Ambulatory Visit (INDEPENDENT_AMBULATORY_CARE_PROVIDER_SITE_OTHER): Payer: Medicare Other | Admitting: Family Medicine

## 2018-09-22 VITALS — BP 164/102 | HR 110 | Temp 98.3°F | Resp 14 | Ht 64.0 in | Wt 176.4 lb

## 2018-09-22 DIAGNOSIS — K219 Gastro-esophageal reflux disease without esophagitis: Secondary | ICD-10-CM

## 2018-09-22 DIAGNOSIS — Z23 Encounter for immunization: Secondary | ICD-10-CM

## 2018-09-22 DIAGNOSIS — R000 Tachycardia, unspecified: Secondary | ICD-10-CM

## 2018-09-22 DIAGNOSIS — E1165 Type 2 diabetes mellitus with hyperglycemia: Secondary | ICD-10-CM | POA: Diagnosis not present

## 2018-09-22 DIAGNOSIS — N183 Chronic kidney disease, stage 3 unspecified: Secondary | ICD-10-CM | POA: Insufficient documentation

## 2018-09-22 DIAGNOSIS — E1122 Type 2 diabetes mellitus with diabetic chronic kidney disease: Secondary | ICD-10-CM | POA: Diagnosis not present

## 2018-09-22 DIAGNOSIS — E1169 Type 2 diabetes mellitus with other specified complication: Secondary | ICD-10-CM | POA: Diagnosis not present

## 2018-09-22 DIAGNOSIS — E785 Hyperlipidemia, unspecified: Secondary | ICD-10-CM

## 2018-09-22 DIAGNOSIS — I10 Essential (primary) hypertension: Secondary | ICD-10-CM

## 2018-09-22 DIAGNOSIS — N182 Chronic kidney disease, stage 2 (mild): Secondary | ICD-10-CM

## 2018-09-22 LAB — POCT GLYCOSYLATED HEMOGLOBIN (HGB A1C): Hemoglobin A1C: 7.7 % — AB (ref 4.0–5.6)

## 2018-09-22 MED ORDER — LISINOPRIL 10 MG PO TABS: 10.0000 mg | ORAL_TABLET | Freq: Every day | ORAL | 3 refills | Status: DC

## 2018-09-22 MED ORDER — METFORMIN HCL 1000 MG PO TABS: 1000.0000 mg | ORAL_TABLET | Freq: Two times a day (BID) | ORAL | 1 refills | Status: DC

## 2018-09-22 MED ORDER — EMPAGLIFLOZIN 10 MG PO TABS: 10.0000 mg | ORAL_TABLET | Freq: Every day | ORAL | 2 refills | Status: DC

## 2018-09-22 MED ORDER — ATORVASTATIN CALCIUM 40 MG PO TABS: 40.0000 mg | ORAL_TABLET | Freq: Every day | ORAL | 1 refills | Status: DC

## 2018-09-22 NOTE — Patient Instructions (Addendum)
Push a lot of clear fluids.  If you feel ill, have any chest pain, shortness of breath, feel faint, headaches, slurred speech, abdominal pain with nausea/vomiting or sweats - you need to go to the ER to get checked.  For Diabetes - continue metformin twice a day and add on jardiance  Cholesterol - continue taking lipitor every day at bedtime  For Blood pressure and kidney protection - I need to increase your medicine or switch to another medicine because your blood pressure is too high right now.   Lets start with increasing lisinopril to 10 mg every morning, work on a low salt diet. Check your BP at home or at the store if you can.  And if it is >150/90 I need you to call me so we change adjust your BP again.   Managing Your Hypertension Hypertension is commonly called high blood pressure. This is when the force of your blood pressing against the walls of your arteries is too strong. Arteries are blood vessels that carry blood from your heart throughout your body. Hypertension forces the heart to work harder to pump blood, and may cause the arteries to become narrow or stiff. Having untreated or uncontrolled hypertension can cause heart attack, stroke, kidney disease, and other problems. What are blood pressure readings? A blood pressure reading consists of a higher number over a lower number. Ideally, your blood pressure should be below 120/80. The first ("top") number is called the systolic pressure. It is a measure of the pressure in your arteries as your heart beats. The second ("bottom") number is called the diastolic pressure. It is a measure of the pressure in your arteries as the heart relaxes. What does my blood pressure reading mean? Blood pressure is classified into four stages. Based on your blood pressure reading, your health care provider may use the following stages to determine what type of treatment you need, if any. Systolic pressure and diastolic pressure are measured in a unit  called mm Hg. Normal  Systolic pressure: below 123456.  Diastolic pressure: below 80. Elevated  Systolic pressure: [id number].  Diastolic pressure: below 80. Hypertension stage 1  Systolic pressure: 0000000.  Diastolic pressure: [id number]. Hypertension stage 2  Systolic pressure: [id number] or above.  Diastolic pressure: 90 or above. What health risks are associated with hypertension? Managing your hypertension is an important responsibility. Uncontrolled hypertension can lead to:  A heart attack.  A stroke.  A weakened blood vessel (aneurysm).  Heart failure.  Kidney damage.  Eye damage.  Metabolic syndrome.  Memory and concentration problems. What changes can I make to manage my hypertension? Hypertension can be managed by making lifestyle changes and possibly by taking medicines. Your health care provider will help you make a plan to bring your blood pressure within a normal range. Eating and drinking   Eat a diet that is high in fiber and potassium, and low in salt (sodium), added sugar, and fat. An example eating plan is called the DASH (Dietary Approaches to Stop Hypertension) diet. To eat this way: ? Eat plenty of fresh fruits and vegetables. Try to fill half of your plate at each meal with fruits and vegetables. ? Eat whole grains, such as whole wheat pasta, brown rice, or whole grain bread. Fill about one quarter of your plate with whole grains. ? Eat low-fat diary products. ? Avoid fatty cuts of meat, processed or cured meats, and poultry with skin. Fill about one quarter of your plate with lean proteins such as  fish, chicken without skin, beans, eggs, and tofu. ? Avoid premade and processed foods. These tend to be higher in sodium, added sugar, and fat.  Reduce your daily sodium intake. Most people with hypertension should eat less than 1,500 mg of sodium a day.  Limit alcohol intake to no more than 1 drink a day for nonpregnant women and 2 drinks a day for men. One  drink equals 12 oz of beer, 5 oz of wine, or 1 oz of hard liquor. Lifestyle  Work with your health care provider to maintain a healthy body weight, or to lose weight. Ask what an ideal weight is for you.  Get at least 30 minutes of exercise that causes your heart to beat faster (aerobic exercise) most days of the week. Activities may include walking, swimming, or biking.  Include exercise to strengthen your muscles (resistance exercise), such as weight lifting, as part of your weekly exercise routine. Try to do these types of exercises for 30 minutes at least 3 days a week.  Do not use any products that contain nicotine or tobacco, such as cigarettes and e-cigarettes. If you need help quitting, ask your health care provider.  Control any long-term (chronic) conditions you have, such as high cholesterol or diabetes. Monitoring  Monitor your blood pressure at home as told by your health care provider. Your personal target blood pressure may vary depending on your medical conditions, your age, and other factors.  Have your blood pressure checked regularly, as often as told by your health care provider. Working with your health care provider  Review all the medicines you take with your health care provider because there may be side effects or interactions.  Talk with your health care provider about your diet, exercise habits, and other lifestyle factors that may be contributing to hypertension.  Visit your health care provider regularly. Your health care provider can help you create and adjust your plan for managing hypertension. Will I need medicine to control my blood pressure? Your health care provider may prescribe medicine if lifestyle changes are not enough to get your blood pressure under control, and if:  Your systolic blood pressure is 130 or higher.  Your diastolic blood pressure is 80 or higher. Take medicines only as told by your health care provider. Follow the directions  carefully. Blood pressure medicines must be taken as prescribed. The medicine does not work as well when you skip doses. Skipping doses also puts you at risk for problems. Contact a health care provider if:  You think you are having a reaction to medicines you have taken.  You have repeated (recurrent) headaches.  You feel dizzy.  You have swelling in your ankles.  You have trouble with your vision. Get help right away if:  You develop a severe headache or confusion.  You have unusual weakness or numbness, or you feel faint.  You have severe pain in your chest or abdomen.  You vomit repeatedly.  You have trouble breathing. Summary  Hypertension is when the force of blood pumping through your arteries is too strong. If this condition is not controlled, it may put you at risk for serious complications.  Your personal target blood pressure may vary depending on your medical conditions, your age, and other factors. For most people, a normal blood pressure is less than 120/80.  Hypertension is managed by lifestyle changes, medicines, or both. Lifestyle changes include weight loss, eating a healthy, low-sodium diet, exercising more, and limiting alcohol. This information is  not intended to replace advice given to you by your health care provider. Make sure you discuss any questions you have with your health care provider. Document Released: 09/15/2011 Document Revised: 04/14/2018 Document Reviewed: 11/19/2015 Elsevier Patient Education  2020 [employer].

## 2018-09-22 NOTE — Progress Notes (Signed)
Name: Cameron Glover   MRN: [id number]    DOB: 04-Dec-1950   Date:09/22/2018       Progress Note  Chief Complaint  Patient presents with   Follow-up   Diabetes    states ran out of insulin, so has not been on   Hyperlipidemia   Hypertension     Subjective:   Cameron Glover is a 68 y.o. male, presents to clinic for routine follow up on the conditions listed above.  Diabetes Mellitus Type II: DM dx 1 years ago Currently managing with metformin in basal insulin but the patient has not been on insulin for several months.  He says he ran out and requested refills, I never received any refill requests and pt did not follow up.  He saw nephrology who routed me a note notifying me he was off meds and needed follow up.  He came in today after we called him.  He is not checking his sugars.  He is a little confused about some of this other meds - tells the nurse he ran out, then tells me he had his meds (for lisinopril and DM/HLD meds)  So really unclear what he is actually taking. Pt denies any concerning SE of meds, and denies any new sx. Denies: Polyuria, polydipsia, polyphagia, vision changes, or neuropathy  Recent pertinent labs: Lab Results  Component Value Date   HGBA1C 7.7 (A) 09/22/2018   HGBA1C 8.5 (H) 04/12/2018   HGBA1C 13.6 (H) 09/03/2017      Component Value Date/Time   NA 141 04/12/2018 0943   K 5.9 (H) 04/12/2018 0943   CL 106 04/12/2018 0943   CO2 25 04/12/2018 0943   GLUCOSE 224 (H) 04/12/2018 0943   BUN 20 04/12/2018 0943   CREATININE 1.33 (H) 04/12/2018 0943   CALCIUM 10.1 04/12/2018 0943   PROT 7.4 04/12/2018 0943   ALBUMIN 4.1 09/02/2017 1738   AST 14 04/12/2018 0943   ALT 13 04/12/2018 0943   ALKPHOS 77 09/02/2017 1738   BILITOT 0.5 04/12/2018 0943   GFRNONAA 56 (L) 04/12/2018 0943   GFRAA 65 04/12/2018 0943   UTD on DM foot exam and eye exam ACEI/ARB: Yes Statin: Yes  Hyperlipidemia:  Current Medication Regimen:lipitor  Last Lipids: Lab  Results  Component Value Date   CHOL 106 04/12/2018   HDL 32 (L) 04/12/2018   LDLCALC 55 04/12/2018   TRIG 109 04/12/2018   CHOLHDL 3.3 04/12/2018   - Current Diet:  No particular diet efforts - Denies: Chest pain, shortness of breath, myalgias. - Documented aortic atherosclerosis? No - Risk factors for atherosclerosis: diabetes mellitus, hypercholesterolemia and hypertension  Tachycardic today -patient adamantly denies any chest pain, shortness of breath, chest tightness, headaches, recent or current illness, fever chills sweats, any pain and he denies any anxiety.  He states he feels completely fine.   Hypertension:  Currently managed on lisinopril 2.5 mg - for renal protection with DM -I have only seen patient a few times since he establish care with me, it was following hospitalization for abdominal pain and new onset diabetes, at that time he was hypovolemic and had very soft blood pressure so we started with very low-dose for renal protection.  He has been taking this without any concerns or side effects.  His blood pressures today is uncontrolled and high but he currently denies any headaches, visual disturbances, Chest pressure, orthopnea, PND, LE edema, exertional SOB, palpitations,  lightheadedness, hypotension, syncope. Blood pressure today is elevated/not controlled today, but usually  is BP Readings from Last 3 Encounters:  09/22/18 (!) 164/102  04/12/18 132/80  10/04/17 140/86   GERD -patient had his Protonix refilled about 2 weeks ago by the other office that I was at, he does not believe he is taking Protonix currently and he does not have any daily abdominal pain, reflux, dyspepsia, bloating.  He denies any change to his bowel movements, denies diarrhea, constipation, melena, hematochezia  He did recently see the nephrologist for consult due to patient's concerns about very strong family history of nephropathy.      Patient Active Problem List   Diagnosis Date Noted    Uncontrolled type 2 diabetes mellitus with hyperglycemia ([place]) 04/15/2018   Hypertension 04/15/2018   DM (diabetes mellitus) with complications ([place]) 64/40/3474    History reviewed. No pertinent surgical history.  Family History  Problem Relation Age of Onset   Kidney failure Sister    Cancer Sister    Diabetes Sister    Hypertension Sister    Hyperlipidemia Sister    Heart disease Sister    Kidney disease Sister    Cancer Brother    CAD Brother    Diabetes Brother    Hypertension Brother    Hyperlipidemia Brother    Heart disease Brother    Diabetes Other    Cancer Mother     Social History   Socioeconomic History   Marital status: Single    Spouse name: Not on file   Number of children: Not on file   Years of education: Not on file   Highest education level: High school graduate  Occupational History   Occupation: Retired    Comment: Sales executive strain: Not very hard   Food insecurity    Worry: Sometimes true    Inability: Sometimes true   Transportation needs    Medical: No    Non-medical: No  Tobacco Use   Smoking status: Never Smoker   Smokeless tobacco: Never Used  Substance and Sexual Activity   Alcohol use: Never    Frequency: Never   Drug use: Never   Sexual activity: Yes    Comment: couple male partners  Lifestyle   Physical activity    Days per week: 5 days    Minutes per session: 30 min   Stress: To some extent  Relationships   Social connections    Talks on phone: More than three times a week    Gets together: More than three times a week    Attends religious service: Never    Active member of club or organization: No    Attends meetings of clubs or organizations: Never    Relationship status: Never married   Intimate partner violence    Fear of current or ex partner: Not on file    Emotionally abused: Not on file    Physically abused: Not on file    Forced  sexual activity: Not on file  Other Topics Concern   Not on file  Social History Narrative   Not on file     Current Outpatient Medications:    atorvastatin (LIPITOR) 40 MG tablet, Take 1 tablet (40 mg total) by mouth daily., Disp: 90 tablet, Rfl: 2   lisinopril (PRINIVIL,ZESTRIL) 2.5 MG tablet, Take 1 tablet (2.5 mg total) by mouth daily., Disp: 90 tablet, Rfl: 1   metFORMIN (GLUCOPHAGE) 1000 MG tablet, TAKE 1 TABLET (1,000 MG TOTAL) BY MOUTH 2 (TWO) TIMES DAILY WITH A MEAL., Disp:  180 tablet, Rfl: 1   pantoprazole (PROTONIX) 40 MG tablet, TAKE 1 TABLET BY MOUTH EVERY DAY, Disp: 90 tablet, Rfl: 0   blood glucose meter kit and supplies KIT, Dispense based on patient and insurance preference. Use up to four times daily as directed. (FOR ICD-9 250.00, 250.01)., Disp: 1 each, Rfl: 0   Insulin Glargine (BASAGLAR KWIKPEN) 100 UNIT/ML SOPN, Inject 0.15-0.3 mLs (15-30 Units total) into the skin daily. (Patient not taking: Reported on 09/22/2018), Disp: 15 mL, Rfl: 1   Insulin Pen Needle 32G X 4 MM MISC, Use as directed with insulin daily, Disp: 100 each, Rfl: 1  No Known Allergies  I personally reviewed active problem list, medication list, allergies, family history, social history, health maintenance, notes from last encounter, lab results with the patient/caregiver today.  Review of Systems  Constitutional: Negative.  Negative for activity change, appetite change, chills, diaphoresis, fatigue and unexpected weight change.  HENT: Negative.   Eyes: Negative.   Respiratory: Negative.  Negative for chest tightness, shortness of breath and wheezing.   Cardiovascular: Negative.  Negative for chest pain, palpitations and leg swelling.  Gastrointestinal: Negative.  Negative for abdominal pain, blood in stool, constipation, diarrhea, nausea and vomiting.  Endocrine: Negative.   Genitourinary: Negative.  Negative for decreased urine volume, difficulty urinating, testicular pain and urgency.    Musculoskeletal: Negative.   Skin: Negative.  Negative for color change and pallor.  Allergic/Immunologic: Negative.   Neurological: Negative.  Negative for dizziness, tremors, syncope, weakness, light-headedness and numbness.  Hematological: Negative.   Psychiatric/Behavioral: Negative.  Negative for agitation, confusion, dysphoric mood, self-injury and suicidal ideas. The patient is not nervous/anxious.   All other systems reviewed and are negative.    Objective:    Vitals:   09/22/18 1119 09/22/18 1136  BP: (!) 160/94 (!) 164/102  Pulse: (!) 108 (!) 110  Resp: 14   Temp: 98.3 F (36.8 C)   SpO2: 96%   Weight: 176 lb 6.4 oz (80 kg)   Height: '5\' 4"'  (1.626 m)     Body mass index is 30.28 kg/m.   Physical Exam Vitals signs and nursing note reviewed.  Constitutional:      General: He is not in acute distress.    Appearance: Normal appearance. He is well-developed. He is obese. He is not ill-appearing, toxic-appearing or diaphoretic.     Interventions: Face mask in place.  HENT:     Head: Normocephalic and atraumatic.     Jaw: No trismus.     Right Ear: External ear normal.     Left Ear: External ear normal.     Nose: Nose normal. No congestion.     Mouth/Throat:     Mouth: Mucous membranes are moist.     Pharynx: Oropharynx is clear.  Eyes:     General: Lids are normal. No scleral icterus.    Extraocular Movements: Extraocular movements intact.     Conjunctiva/sclera: Conjunctivae normal.     Pupils: Pupils are equal, round, and reactive to light.  Neck:     Musculoskeletal: Normal range of motion and neck supple.     Vascular: No carotid bruit.     Trachea: Trachea and phonation normal. No tracheal deviation.  Cardiovascular:     Rate and Rhythm: Regular rhythm. Tachycardia present.  No extrasystoles are present.    Pulses: Normal pulses.          Radial pulses are 2+ on the right side and 2+ on the left side.  Posterior tibial pulses are 2+ on the right  side and 2+ on the left side.     Heart sounds: Normal heart sounds. No murmur. No friction rub. No gallop.   Pulmonary:     Effort: Pulmonary effort is normal. No respiratory distress.     Breath sounds: Normal breath sounds. No stridor. No wheezing, rhonchi or rales.  Abdominal:     General: Bowel sounds are normal. There is no distension.     Palpations: Abdomen is soft.     Tenderness: There is no abdominal tenderness. There is no guarding or rebound.  Musculoskeletal: Normal range of motion.     Right lower leg: No edema.     Left lower leg: No edema.  Skin:    General: Skin is warm and dry.     Capillary Refill: Capillary refill takes less than 2 seconds.     Coloration: Skin is not jaundiced or pale.     Findings: No rash.     Nails: There is no clubbing.   Neurological:     General: No focal deficit present.     Mental Status: He is alert.     Cranial Nerves: No dysarthria or facial asymmetry.     Motor: No weakness, tremor or abnormal muscle tone.     Coordination: Coordination normal.     Gait: Gait normal.  Psychiatric:        Mood and Affect: Mood normal.        Speech: Speech normal.        Behavior: Behavior normal. Behavior is cooperative.        Thought Content: Thought content normal.        Judgment: Judgment normal.       ECG interpretation   Date: 09/22/18  Rate: tachycardic HR 103  Rhythm: sinus tachycardia  QRS Axis: left  Intervals: normal  ST/T Wave abnormalities: nonspecific t-wave abnormality to lead III and aVF, isolated j-point elevation in V2, no other continuous or reciprocal ST elevation or depression, LVH  Conduction Disutrbances: none  Old EKG Reviewed: Yes - reviewed 05/11/2013 EKG where inferior ST-T wave abnormalities were present and left axis deviation, but LVH is new/worsened  ECG reviewed with SP on day of encounter   PHQ2/9: Depression screen [facility] 2/9 09/22/2018 10/04/2017 09/16/2017  Decreased Interest 0 0 1  Down, Depressed,  Hopeless 0 2 2  PHQ - 2 Score 0 2 3  Altered sleeping 0 3 2  Tired, decreased energy 0 1 2  Change in appetite 0 0 1  Feeling bad or failure about yourself  0 0 1  Trouble concentrating 0 0 1  Moving slowly or fidgety/restless 0 0 1  Suicidal thoughts 0 0 1  PHQ-9 Score 0 6 12  Difficult doing work/chores Not difficult at all Not difficult at all Somewhat difficult    phq 9 is negative reviewed  Fall Risk: Fall Risk  09/22/2018 10/04/2017 09/16/2017  Falls in the past year? 0 Yes Yes  Number falls in past yr: 0 2 or more 2 or more  Injury with Fall? 0 No No   Functional Status Survey: Is the patient deaf or have difficulty hearing?: No Does the patient have difficulty seeing, even when wearing glasses/contacts?: No Does the patient have difficulty concentrating, remembering, or making decisions?: No Does the patient have difficulty walking or climbing stairs?: No Does the patient have difficulty dressing or bathing?: No Does the patient have difficulty doing errands alone such as  visiting a doctor's office or shopping?: No    Assessment & Plan:     1. Uncontrolled type 2 diabetes mellitus with hyperglycemia (HCC) Pt lost to f/up and non-compliant with last plan improving A1C, now 7.7, con't metformin, d/c insulin will add another med - jardiance? for cardio and renal protection? - POCT HgB A1C - COMPLETE METABOLIC PANEL WITH GFR - empagliflozin (JARDIANCE) 10 MG TABS tablet; Take 10 mg by mouth daily before breakfast.  Dispense: 30 tablet; Refill: 2 - metFORMIN (GLUCOPHAGE) 1000 MG tablet; Take 1 tablet (1,000 mg total) by mouth 2 (two) times daily with a meal.  Dispense: 180 tablet; Refill: 1 - atorvastatin (LIPITOR) 40 MG tablet; Take 1 tablet (40 mg total) by mouth daily.  Dispense: 90 tablet; Refill: 1  2. Hypertension, unspecified type - uncontrolled Poorly controlled, increase lisinopril to 10 mg daily, close f/up - COMPLETE METABOLIC PANEL WITH GFR  3.  Hyperlipidemia associated with type 2 diabetes mellitus (HCC) High risk with DM - continue statin, encouraged healthy diet and exercise, recheck labs - Lipid panel - COMPLETE METABOLIC PANEL WITH GFR  4. CKD stage 2 due to type 2 diabetes mellitus ([place]) Consulted nephrology - they said he was good and they encouraged him to continue lisinopril - COMPLETE METABOLIC PANEL WITH GFR - CBC with Differential/Platelet  5. Gastroesophageal reflux disease, esophagitis presence not specified Not on meds, currently denies sx.  6. Tachycardia - NEW Sinus tach w/o known etiology, appears euvolemic, denies pain, illness, afebrile.  He has a slight tremor making him appear slightly anxious, but pt denies anxiety, feels good.  Denies CP, SOB, back pain, abd pain, HA.  HR did not improve with rechecking vitals after being in clinic.  ER precautions reviewed at length with pt, handout annotated with sx and signs he should be seen for immediately, checking basic labs to r/o anemia, electrolyte derangement, dehydration (although I doubt clinically), r/o hyperthyroid - EKG 12-Lead - TSH  ECG shows sinus tach, LVH, left axis deviation and non-specific t wave abnormalities - with family hx and pt's poorly controlled chronic conditions due to non-compliance? Getting lost to f/up  Or possibly pt may just have trouble understanding meds/management and plan - would like to refer to cardiology for consult - ECHO? Stress test?   Again ER precautions for sx and signs of acs/pe/hypertensive urgency etc was reviewed and pt verbalized understanding  7. Need for influenza vaccination Done today  8. Need for vaccination with 13-polyvalent pneumococcal conjugate vaccine Done today   Return in about 3 months (around 12/22/2018) for Routine follow-up and 1 month virtual f/up on BP recheck.  Need to return to clinic sooner (in 1-4 weeks) if BP remains high or HR remains high.   [name], PA-C 09/22/18 11:33 AM

## 2018-09-23 LAB — COMPLETE METABOLIC PANEL WITH GFR
AG Ratio: 1.4 (calc) (ref 1.0–2.5)
ALT: 13 U/L (ref 9–46)
AST: 12 U/L (ref 10–35)
Albumin: 4.1 g/dL (ref 3.6–5.1)
Alkaline phosphatase (APISO): 88 U/L (ref 35–144)
BUN: 15 mg/dL (ref 7–25)
CO2: 23 mmol/L (ref 20–32)
Calcium: 9.1 mg/dL (ref 8.6–10.3)
Chloride: 105 mmol/L (ref 98–110)
Creat: 1.16 mg/dL (ref 0.70–1.25)
GFR, Est African American: 76 mL/min/{1.73_m2} (ref >=60)
GFR, Est Non African American: 66 mL/min/{1.73_m2} (ref >=60)
Globulin: 2.9 g/dL (calc) (ref 1.9–3.7)
Glucose, Bld: 249 mg/dL — ABNORMAL HIGH (ref 65–99)
Potassium: 4.2 mmol/L (ref 3.5–5.3)
Sodium: 139 mmol/L (ref 135–146)
Total Bilirubin: 0.4 mg/dL (ref 0.2–1.2)
Total Protein: 7 g/dL (ref 6.1–8.1)

## 2018-09-23 LAB — LIPID PANEL
Cholesterol: 104 mg/dL (ref <200)
HDL: 34 mg/dL — ABNORMAL LOW (ref >=40)
LDL Cholesterol (Calc): 51 mg/dL (calc)
Non-HDL Cholesterol (Calc): 70 mg/dL (calc) (ref <130)
Total CHOL/HDL Ratio: 3.1 (calc) (ref <5.0)
Triglycerides: 111 mg/dL (ref <150)

## 2018-09-23 LAB — CBC WITH DIFFERENTIAL/PLATELET
Absolute Monocytes: 311 cells/uL (ref 200–950)
Basophils Absolute: 51 cells/uL (ref 0–200)
Basophils Relative: 1 %
Eosinophils Absolute: 148 cells/uL (ref 15–500)
Eosinophils Relative: 2.9 %
HCT: 42.7 % (ref 38.5–50.0)
Hemoglobin: 13.9 g/dL (ref 13.2–17.1)
Lymphs Abs: 1969 cells/uL (ref 850–3900)
MCH: 29.6 pg (ref 27.0–33.0)
MCHC: 32.6 g/dL (ref 32.0–36.0)
MCV: 90.9 fL (ref 80.0–100.0)
MPV: 11.1 fL (ref 7.5–12.5)
Monocytes Relative: 6.1 %
Neutro Abs: 2621 cells/uL (ref 1500–7800)
Neutrophils Relative %: 51.4 %
Platelets: 216 10*3/uL (ref 140–400)
RBC: 4.7 10*6/uL (ref 4.20–5.80)
RDW: 12.5 % (ref 11.0–15.0)
Total Lymphocyte: 38.6 %
WBC: 5.1 10*3/uL (ref 3.8–10.8)

## 2018-09-23 LAB — TSH: TSH: 1.22 mIU/L (ref 0.40–4.50)

## 2018-09-25 ENCOUNTER — Encounter: Payer: Self-pay | Admitting: Family Medicine

## 2018-10-09 ENCOUNTER — Other Ambulatory Visit: Payer: Self-pay | Admitting: Family Medicine

## 2018-10-23 ENCOUNTER — Ambulatory Visit (INDEPENDENT_AMBULATORY_CARE_PROVIDER_SITE_OTHER): Payer: Medicare Other | Admitting: Family Medicine

## 2018-10-23 ENCOUNTER — Other Ambulatory Visit: Payer: Self-pay

## 2018-10-23 ENCOUNTER — Encounter: Payer: Self-pay | Admitting: Family Medicine

## 2018-10-24 ENCOUNTER — Other Ambulatory Visit: Payer: Self-pay

## 2018-10-24 ENCOUNTER — Ambulatory Visit: Payer: Medicare Other | Admitting: Family Medicine

## 2018-10-24 ENCOUNTER — Encounter: Payer: Self-pay | Admitting: Family Medicine

## 2018-10-24 VITALS — BP 162/98 | HR 96 | Temp 98.1°F | Resp 14 | Ht 64.0 in | Wt 176.1 lb

## 2018-10-24 DIAGNOSIS — I10 Essential (primary) hypertension: Secondary | ICD-10-CM | POA: Diagnosis not present

## 2018-10-24 NOTE — Progress Notes (Signed)
Patient ID: Cameron Glover, male    DOB: Sep 22, 1950, 68 y.o.   MRN: [id number]  PCP: [name], PA-C  Chief Complaint  Patient presents with  . Hypertension    Subjective:   Cameron Glover is a 68 y.o. male, presents to clinic with CC of the following:  HPI   Hypertension:  Currently managed on lisinopril 10 mg  Pt reports good med compliance and denies any SE.   No lightheadedness, hypotension, syncope. Blood pressure today is not well controlled, asx today, did not take meds yet, will be rechecking BP before leaving clinic BP Readings from Last 3 Encounters:  10/24/18 (!) 168/96  09/22/18 (!) 164/102  04/12/18 132/80  Pt denies CP, SOB, exertional sx, LE edema, palpitation, Ha's, visual disturbances  DM meds on metformin and jardiance w/o any SE or concerns, last CBG was yesterday in the afternoon was 150 - will be coming back in 2 months for routine f/up  Cholesterol, taking lipitor 40 mg every night, no myalgia, CP, SOB, jaundice   Patient Active Problem List   Diagnosis Date Noted  . Hyperlipidemia associated with type 2 diabetes mellitus ([place]) 09/22/2018  . CKD stage 2 due to type 2 diabetes mellitus ([place]) 09/22/2018  . Uncontrolled type 2 diabetes mellitus with hyperglycemia ([place]) 04/15/2018  . Hypertension 04/15/2018  . DM (diabetes mellitus) with complications ([place]) 86/57/8469      Current Outpatient Medications:  .  atorvastatin (LIPITOR) 40 MG tablet, Take 1 tablet (40 mg total) by mouth daily., Disp: 90 tablet, Rfl: 1 .  blood glucose meter kit and supplies KIT, Dispense based on patient and insurance preference. Use up to four times daily as directed. (FOR ICD-9 250.00, 250.01)., Disp: 1 each, Rfl: 0 .  empagliflozin (JARDIANCE) 10 MG TABS tablet, Take 10 mg by mouth daily before breakfast., Disp: 30 tablet, Rfl: 2 .  lisinopril (ZESTRIL) 10 MG tablet, Take 1 tablet (10 mg total) by mouth daily., Disp: 90 tablet, Rfl: 3 .  metFORMIN (GLUCOPHAGE) 1000  MG tablet, Take 1 tablet (1,000 mg total) by mouth 2 (two) times daily with a meal., Disp: 180 tablet, Rfl: 1   No Known Allergies   Family History  Problem Relation Age of Onset  . Kidney failure Sister   . Cancer Sister   . Diabetes Sister   . Hypertension Sister   . Hyperlipidemia Sister   . Heart disease Sister   . Kidney disease Sister   . Cancer Brother   . CAD Brother   . Diabetes Brother   . Hypertension Brother   . Hyperlipidemia Brother   . Heart disease Brother   . Diabetes Other   . Cancer Mother      Social History   Socioeconomic History  . Marital status: Single    Spouse name: Not on file  . Number of children: Not on file  . Years of education: Not on file  . Highest education level: High school graduate  Occupational History  . Occupation: Retired    Comment: Dealer  . Financial resource strain: Not very hard  . Food insecurity    Worry: Sometimes true    Inability: Sometimes true  . Transportation needs    Medical: No    Non-medical: No  Tobacco Use  . Smoking status: Never Smoker  . Smokeless tobacco: Never Used  Substance and Sexual Activity  . Alcohol use: Never    Frequency: Never  . Drug use: Never  .  Sexual activity: Yes    Comment: couple male partners  Lifestyle  . Physical activity    Days per week: 5 days    Minutes per session: 30 min  . Stress: To some extent  Relationships  . Social connections    Talks on phone: More than three times a week    Gets together: More than three times a week    Attends religious service: Never    Active member of club or organization: No    Attends meetings of clubs or organizations: Never    Relationship status: Never married  . Intimate partner violence    Fear of current or ex partner: Not on file    Emotionally abused: Not on file    Physically abused: Not on file    Forced sexual activity: Not on file  Other Topics Concern  . Not on file  Social History  Narrative  . Not on file    I personally reviewed active problem list, medication list, allergies, family history, social history, health maintenance, lab results with the patient/caregiver today.  Review of Systems  Constitutional: Negative.   HENT: Negative.   Eyes: Negative.   Respiratory: Negative.  Negative for cough, choking, chest tightness, shortness of breath and wheezing.   Cardiovascular: Negative.  Negative for chest pain, palpitations and leg swelling.  Gastrointestinal: Negative.   Endocrine: Negative.   Genitourinary: Negative.   Musculoskeletal: Negative.   Skin: Negative.   Allergic/Immunologic: Negative.   Neurological: Negative.  Negative for headaches.  Hematological: Negative.   Psychiatric/Behavioral: Negative.   All other systems reviewed and are negative.      Objective:   Vitals:   10/24/18 0944  BP: (!) 168/96  Pulse: 96  Resp: 14  Temp: 98.1 F (36.7 C)  SpO2: 99%  Weight: 176 lb 1.6 oz (79.9 kg)  Height: 5' 4" (1.626 m)    Body mass index is 30.23 kg/m.  Physical Exam Vitals signs and nursing note reviewed.  Constitutional:      General: He is not in acute distress.    Appearance: Normal appearance. He is well-developed. He is obese. He is not ill-appearing, toxic-appearing or diaphoretic.     Interventions: Face mask in place.  HENT:     Head: Normocephalic and atraumatic.     Jaw: No trismus.     Right Ear: External ear normal.     Left Ear: External ear normal.  Eyes:     General: Lids are normal. No scleral icterus.    Conjunctiva/sclera: Conjunctivae normal.     Pupils: Pupils are equal, round, and reactive to light.  Neck:     Musculoskeletal: Normal range of motion and neck supple.     Trachea: Trachea and phonation normal. No tracheal deviation.  Cardiovascular:     Rate and Rhythm: Normal rate and regular rhythm.     Pulses: Normal pulses.          Radial pulses are 2+ on the right side and 2+ on the left side.        Posterior tibial pulses are 2+ on the right side and 2+ on the left side.     Heart sounds: Normal heart sounds. No murmur. No friction rub. No gallop.   Pulmonary:     Effort: Pulmonary effort is normal. No respiratory distress.     Breath sounds: Normal breath sounds. No stridor. No wheezing, rhonchi or rales.  Abdominal:     General: Bowel sounds are normal.  There is no distension.     Palpations: Abdomen is soft.     Tenderness: There is no abdominal tenderness. There is no guarding or rebound.  Musculoskeletal:     Right lower leg: No edema.     Left lower leg: No edema.  Skin:    General: Skin is warm and dry.     Capillary Refill: Capillary refill takes less than 2 seconds.     Coloration: Skin is not jaundiced.     Findings: No rash.     Nails: There is no clubbing.   Neurological:     Mental Status: He is alert.     Cranial Nerves: No dysarthria or facial asymmetry.     Motor: No tremor or abnormal muscle tone.     Gait: Gait normal.  Psychiatric:        Mood and Affect: Mood normal.        Speech: Speech normal.        Behavior: Behavior normal. Behavior is cooperative.      Results for orders placed or performed in visit on 09/22/18  Lipid panel  Result Value Ref Range   Cholesterol 104 <200 mg/dL   HDL 34 (L) > OR = 40 mg/dL   Triglycerides 111 <150 mg/dL   LDL Cholesterol (Calc) 51 mg/dL (calc)   Total CHOL/HDL Ratio 3.1 <5.0 (calc)   Non-HDL Cholesterol (Calc) 70 <130 mg/dL (calc)  COMPLETE METABOLIC PANEL WITH GFR  Result Value Ref Range   Glucose, Bld 249 (H) 65 - 99 mg/dL   BUN 15 7 - 25 mg/dL   Creat 1.16 0.70 - 1.25 mg/dL   GFR, Est Non African American 66 > OR = 60 mL/min/1.59m   GFR, Est African American 76 > OR = 60 mL/min/1.744m  BUN/Creatinine Ratio NOT APPLICABLE 6 - 22 (calc)   Sodium 139 135 - 146 mmol/L   Potassium 4.2 3.5 - 5.3 mmol/L   Chloride 105 98 - 110 mmol/L   CO2 23 20 - 32 mmol/L   Calcium 9.1 8.6 - 10.3 mg/dL   Total  Protein 7.0 6.1 - 8.1 g/dL   Albumin 4.1 3.6 - 5.1 g/dL   Globulin 2.9 1.9 - 3.7 g/dL (calc)   AG Ratio 1.4 1.0 - 2.5 (calc)   Total Bilirubin 0.4 0.2 - 1.2 mg/dL   Alkaline phosphatase (APISO) 88 35 - 144 U/L   AST 12 10 - 35 U/L   ALT 13 9 - 46 U/L  CBC with Differential/Platelet  Result Value Ref Range   WBC 5.1 3.8 - 10.8 Thousand/uL   RBC 4.70 4.20 - 5.80 Million/uL   Hemoglobin 13.9 13.2 - 17.1 g/dL   HCT 42.7 38.5 - 50.0 %   MCV 90.9 80.0 - 100.0 fL   MCH 29.6 27.0 - 33.0 pg   MCHC 32.6 32.0 - 36.0 g/dL   RDW 12.5 11.0 - 15.0 %   Platelets 216 140 - 400 Thousand/uL   MPV 11.1 7.5 - 12.5 fL   Neutro Abs 2,621 1,500 - 7,800 cells/uL   Lymphs Abs 1,969 850 - 3,900 cells/uL   Absolute Monocytes 311 200 - 950 cells/uL   Eosinophils Absolute 148 15 - 500 cells/uL   Basophils Absolute 51 0 - 200 cells/uL   Neutrophils Relative % 51.4 %   Total Lymphocyte 38.6 %   Monocytes Relative 6.1 %   Eosinophils Relative 2.9 %   Basophils Relative 1.0 %  TSH  Result Value Ref Range  TSH 1.22 0.40 - 4.50 mIU/L  POCT HgB A1C  Result Value Ref Range   Hemoglobin A1C 7.7 (A) 4.0 - 5.6 %   HbA1c POC (<> result, manual entry)     HbA1c, POC (prediabetic range)     HbA1c, POC (controlled diabetic range)          Assessment & Plan:      ICD-10-CM   1. Essential hypertension  I10    still poorly controlled, increase lisinopril dose to 20 mg daily and recheck in one month.  consider changing to losartan or adding norvasc    med list summarized clearly for pt with follow up plan and parameters with when to follow up sooner if BP >150/90   [name], PA-C 10/24/18 9:48 AM

## 2018-10-24 NOTE — Patient Instructions (Addendum)
For your blood pressure start to take 2 pills every morning Lisinopril 20 mg every morning Check your blood pressure when you can and call me if it is over 150/90  Cholesterol - lipitor 40 mg at bedtime  Diabetes - Metformin 1000 mg twice a day and Jardiance 10 mg in the morning Check your blood sugar about 3x a week, in the morning before you eat.  Goal would be to have you below 150 Please call me if it is over that cause we will want to change your medicines.    Return in 2 months in office to recheck everything with labs   We will do a phone check in - in one month to check on your blood pressure  Have a few readings ready for me from Nov 10-20th so when we talk on the phone we can see if the meds are working or not

## 2018-10-30 NOTE — Progress Notes (Signed)
Appt rescheduled - need BP reading to do f/up

## 2018-12-07 ENCOUNTER — Other Ambulatory Visit: Payer: Self-pay | Admitting: Family Medicine

## 2018-12-07 DIAGNOSIS — R1013 Epigastric pain: Secondary | ICD-10-CM

## 2018-12-22 ENCOUNTER — Encounter: Payer: Self-pay | Admitting: Family Medicine

## 2018-12-22 ENCOUNTER — Ambulatory Visit (INDEPENDENT_AMBULATORY_CARE_PROVIDER_SITE_OTHER): Payer: Medicare Other | Admitting: Family Medicine

## 2018-12-22 VITALS — Ht 64.0 in | Wt 184.0 lb

## 2018-12-22 DIAGNOSIS — E1122 Type 2 diabetes mellitus with diabetic chronic kidney disease: Secondary | ICD-10-CM

## 2018-12-22 DIAGNOSIS — E1165 Type 2 diabetes mellitus with hyperglycemia: Secondary | ICD-10-CM | POA: Diagnosis not present

## 2018-12-22 DIAGNOSIS — I10 Essential (primary) hypertension: Secondary | ICD-10-CM | POA: Diagnosis not present

## 2018-12-22 DIAGNOSIS — R1013 Epigastric pain: Secondary | ICD-10-CM | POA: Diagnosis not present

## 2018-12-22 DIAGNOSIS — E785 Hyperlipidemia, unspecified: Secondary | ICD-10-CM

## 2018-12-22 DIAGNOSIS — E1169 Type 2 diabetes mellitus with other specified complication: Secondary | ICD-10-CM | POA: Diagnosis not present

## 2018-12-22 DIAGNOSIS — N182 Chronic kidney disease, stage 2 (mild): Secondary | ICD-10-CM

## 2018-12-22 DIAGNOSIS — Z1211 Encounter for screening for malignant neoplasm of colon: Secondary | ICD-10-CM

## 2018-12-22 MED ORDER — PANTOPRAZOLE SODIUM 40 MG PO TBEC: 40.0000 mg | DELAYED_RELEASE_TABLET | Freq: Every day | ORAL | 1 refills | Status: DC

## 2018-12-22 NOTE — Progress Notes (Signed)
Name: Cameron Glover   MRN: [id number]    DOB: 08/09/1950   Date:12/22/2018       Progress Note  Subjective:    Chief Complaint  Chief Complaint  Patient presents with  . Follow-up  . Hypertension  . Hyperlipidemia  . Diabetes    I connected with  Hakeem Frazzini  on 12/22/18 at 10:40 AM EST by a video enabled telemedicine application and verified that I am speaking with the correct person using two identifiers.  I discussed the limitations of evaluation and management by telemedicine and the availability of in person appointments. The patient expressed understanding and agreed to proceed. Staff also discussed with the patient that there may be a patient responsible charge related to this service. Patient Location: home Provider Location: [facility] clinic Additional Individuals present: none   HPI   Diabetes Mellitus Type II: Currently managing with metformin 1000 mg BID- some diarrhea intermittently Not taking jardiance Fasting CBGs typically run 160-180, checking only in the morning, No hypoglycemic episodes Denies: Polyuria, polydipsia, polyphagia, vision changes, or neuropathy  Recent pertinent labs: Lab Results  Component Value Date   HGBA1C 7.7 (A) 09/22/2018   HGBA1C 8.5 (H) 04/12/2018   HGBA1C 13.6 (H) 09/03/2017      Component Value Date/Time   NA 139 09/22/2018 0000   K 4.2 09/22/2018 0000   CL 105 09/22/2018 0000   CO2 23 09/22/2018 0000   GLUCOSE 249 (H) 09/22/2018 0000   BUN 15 09/22/2018 0000   CREATININE 1.16 09/22/2018 0000   CALCIUM 9.1 09/22/2018 0000   PROT 7.0 09/22/2018 0000   ALBUMIN 4.1 09/02/2017 1738   AST 12 09/22/2018 0000   ALT 13 09/22/2018 0000   ALKPHOS 77 09/02/2017 1738   BILITOT 0.4 09/22/2018 0000   GFRNONAA 66 09/22/2018 0000   GFRAA 76 09/22/2018 0000   Current diet: in general, an "unhealthy" diet - no diet efforts Current exercise: none  A little walking but that's it   UTD on DM foot exam and eye exam ACEI/ARB: Yes Statin:  Yes  Hypertension:  Currently managed on lisinopril 10 mg  Pt reports good med compliance and denies any SE.  No lightheadedness, hypotension, syncope. BP machine is not giving him good readings - giving him an error- did not come in or get reading as instructed last time, but he denies any sx or concerns BP Readings from Last 3 Encounters:  10/24/18 (!) 162/98  09/22/18 (!) 164/102  04/12/18 132/80   Pt denies CP, SOB, exertional sx, LE edema, palpitation, Ha's, visual disturbances Dietary efforts for BP?  None   Hyperlipidemia: Current Medication Regimen:lipitor 40 mg Last Lipids: Lab Results  Component Value Date   CHOL 104 09/22/2018   HDL 34 (L) 09/22/2018   LDLCALC 51 09/22/2018   TRIG 111 09/22/2018   CHOLHDL 3.1 09/22/2018   - Current Diet:  See above - Denies: Chest pain, shortness of breath, myalgias.  GERD:  Still taking protonix and sx well controlled, does not have indigestion, bloating, epigastric pain, reflux, melena, hematochezia, unintentional weight loss, stool changes    Patient Active Problem List   Diagnosis Date Noted  . Hyperlipidemia associated with type 2 diabetes mellitus ([place]) 09/22/2018  . CKD stage 2 due to type 2 diabetes mellitus ([place]) 09/22/2018  . Uncontrolled type 2 diabetes mellitus with hyperglycemia ([place]) 04/15/2018  . Essential hypertension 04/15/2018  . DM (diabetes mellitus) with complications ([place]) 54/49/2010    History reviewed. No pertinent surgical history.  Family History  Problem Relation Age of Onset  . Kidney failure Sister   . Cancer Sister   . Diabetes Sister   . Hypertension Sister   . Hyperlipidemia Sister   . Heart disease Sister   . Kidney disease Sister   . Cancer Brother   . CAD Brother   . Diabetes Brother   . Hypertension Brother   . Hyperlipidemia Brother   . Heart disease Brother   . Diabetes Other   . Cancer Mother     Social History   Socioeconomic History  . Marital status: Single     Spouse name: Not on file  . Number of children: Not on file  . Years of education: Not on file  . Highest education level: High school graduate  Occupational History  . Occupation: Retired    Comment: Administrator, [employer]  Tobacco Use  . Smoking status: Never Smoker  . Smokeless tobacco: Never Used  Substance and Sexual Activity  . Alcohol use: Never  . Drug use: Never  . Sexual activity: Yes    Comment: couple male partners  Other Topics Concern  . Not on file  Social History Narrative  . Not on file   Social Determinants of Health   Financial Resource Strain:   . Difficulty of Paying Living Expenses: Not on file  Food Insecurity:   . Worried About Charity fundraiser in the Last Year: Not on file  . Ran Out of Food in the Last Year: Not on file  Transportation Needs:   . Lack of Transportation (Medical): Not on file  . Lack of Transportation (Non-Medical): Not on file  Physical Activity:   . Days of Exercise per Week: Not on file  . Minutes of Exercise per Session: Not on file  Stress:   . Feeling of Stress : Not on file  Social Connections:   . Frequency of Communication with Friends and Family: Not on file  . Frequency of Social Gatherings with Friends and Family: Not on file  . Attends Religious Services: Not on file  . Active Member of Clubs or Organizations: Not on file  . Attends Archivist Meetings: Not on file  . Marital Status: Not on file  Intimate Partner Violence:   . Fear of Current or Ex-Partner: Not on file  . Emotionally Abused: Not on file  . Physically Abused: Not on file  . Sexually Abused: Not on file     Current Outpatient Medications:  .  atorvastatin (LIPITOR) 40 MG tablet, Take 1 tablet (40 mg total) by mouth daily., Disp: 90 tablet, Rfl: 1 .  empagliflozin (JARDIANCE) 10 MG TABS tablet, Take 10 mg by mouth daily before breakfast., Disp: 30 tablet, Rfl: 2 .  lisinopril (ZESTRIL) 10 MG tablet, Take 1 tablet (10 mg total) by mouth  daily., Disp: 90 tablet, Rfl: 3 .  metFORMIN (GLUCOPHAGE) 1000 MG tablet, Take 1 tablet (1,000 mg total) by mouth 2 (two) times daily with a meal., Disp: 180 tablet, Rfl: 1 .  pantoprazole (PROTONIX) 40 MG tablet, TAKE 1 TABLET BY MOUTH EVERY DAY, Disp: 30 tablet, Rfl: 0 .  blood glucose meter kit and supplies KIT, Dispense based on patient and insurance preference. Use up to four times daily as directed. (FOR ICD-9 250.00, 250.01)., Disp: 1 each, Rfl: 0  No Known Allergies  I personally reviewed active problem list, medication list, allergies, family history, social history, health maintenance, notes from last encounter, lab results, imaging with the  patient/caregiver today.  Review of Systems  Constitutional: Negative.   HENT: Negative.   Eyes: Negative.   Respiratory: Negative.   Cardiovascular: Negative.   Gastrointestinal: Negative.   Endocrine: Negative.   Genitourinary: Negative.   Musculoskeletal: Negative.   Skin: Negative.   Allergic/Immunologic: Negative.   Neurological: Negative.   Hematological: Negative.   Psychiatric/Behavioral: Negative.   All other systems reviewed and are negative.      Objective:    Virtual encounter, vitals limited, only able to obtain the following Today's Vitals   12/22/18 1110  Weight: 184 lb (83.5 kg)  Height: '5\' 4"'$  (1.626 m)   Body mass index is 31.58 kg/m. Nursing Note and Vital Signs reviewed.  Physical Exam Vitals and nursing note reviewed.  Constitutional:      General: He is not in acute distress.    Appearance: He is not ill-appearing, toxic-appearing or diaphoretic.  Pulmonary:     Effort: No respiratory distress.  Skin:    Coloration: Skin is not jaundiced or pale.  Neurological:     Mental Status: He is alert.  Psychiatric:        Mood and Affect: Mood normal.        Behavior: Behavior normal.     PE limited by telephone encounter  No results found for this or any previous visit (from the past 72  hour(s)).  PHQ2/9: Depression screen [facility] 2/9 12/22/2018 09/22/2018 10/04/2017 09/16/2017  Decreased Interest 0 0 0 1  Down, Depressed, Hopeless 0 0 2 2  PHQ - 2 Score 0 0 2 3  Altered sleeping 0 0 3 2  Tired, decreased energy 0 0 1 2  Change in appetite 0 0 0 1  Feeling bad or failure about yourself  0 0 0 1  Trouble concentrating 0 0 0 1  Moving slowly or fidgety/restless 0 0 0 1  Suicidal thoughts 0 0 0 1  PHQ-9 Score 0 0 6 12  Difficult doing work/chores Not difficult at all Not difficult at all Not difficult at all Somewhat difficult   PHQ-2/9 Result is negative.    Fall Risk: Fall Risk  12/22/2018 10/23/2018 09/22/2018 10/04/2017 09/16/2017  Falls in the past year? 0 0 0 Yes Yes  Number falls in past yr: 0 0 0 2 or more 2 or more  Injury with Fall? 0 0 0 No No     Assessment and Plan:      ICD-10-CM   1. Essential hypertension  I10    BP was high, did not check BP at home, did not come in to clinic as instructed, asked again to get reading or come in so we can assess, taking meds, no SE  2. Type 2 diabetes mellitus with hyperglycemia, without long-term current use of insulin (HCC)  E11.65    fasting CBG high 160-180, only on metformin, may not quite be controlled yet, but has been improving overall, encouraged diet efforts, check A1C  3. Epigastric pain  R10.13 pantoprazole (PROTONIX) 40 MG tablet   GERD sx and abd pain well controlled with protonix daily, refills sent in  4. Hyperlipidemia associated with type 2 diabetes mellitus ([place])  E11.69    E78.5    compliant with statin, no myalgias or concerns, last lipids good, encouraged better diet and exercise for cardiovascular health, check lipids at next appt  5. CKD stage 2 due to type 2 diabetes mellitus (HCC)  E11.22    N18.2   6. Screen for colon cancer  Z12.11 Ambulatory referral to Gastroenterology   referral made for CRC screening     I discussed the assessment and treatment plan with the patient. The patient was  provided an opportunity to ask questions and all were answered. The patient agreed with the plan and demonstrated an understanding of the instructions.  The patient was advised to call back or seek an in-person evaluation if the symptoms worsen or if the condition fails to improve as anticipated.  I provided 18 minutes of non-face-to-face time during this encounter.  [name], PA-C 12/22/2010:02 PM

## 2018-12-25 ENCOUNTER — Other Ambulatory Visit: Payer: Self-pay

## 2018-12-25 ENCOUNTER — Telehealth: Payer: Self-pay

## 2018-12-25 DIAGNOSIS — Z1211 Encounter for screening for malignant neoplasm of colon: Secondary | ICD-10-CM

## 2018-12-25 MED ORDER — NA SULFATE-K SULFATE-MG SULF 17.5-3.13-1.6 GM/177ML PO SOLN: 354.0000 mL | Freq: Once | ORAL | 0 refills | Status: AC

## 2018-12-25 NOTE — Telephone Encounter (Signed)
Gastroenterology Pre-Procedure Review    PATIENT REVIEW QUESTIONS: The patient responded to the following health history questions as indicated:    1. Are you having any GI issues? no 2. Do you have a personal history of Polyps? no 3. Do you have a family history of Colon Cancer or Polyps? no 4. Diabetes Mellitus? yes (type 2. ) 5. Joint replacements in the past 12 months?no 6. Major health problems in the past 3 months?no 7. Any artificial heart valves, MVP, or defibrillator?no    MEDICATIONS & ALLERGIES:    Patient reports the following regarding taking any anticoagulation/antiplatelet therapy:   Plavix, Coumadin, Eliquis, Xarelto, Lovenox, Pradaxa, Brilinta, or Effient? no Aspirin? no  Patient confirms/reports the following medications:  Current Outpatient Medications  Medication Sig Dispense Refill  . atorvastatin (LIPITOR) 40 MG tablet Take 1 tablet (40 mg total) by mouth daily. 90 tablet 1  . blood glucose meter kit and supplies KIT Dispense based on patient and insurance preference. Use up to four times daily as directed. (FOR ICD-9 250.00, 250.01). 1 each 0  . lisinopril (ZESTRIL) 10 MG tablet Take 1 tablet (10 mg total) by mouth daily. 90 tablet 3  . metFORMIN (GLUCOPHAGE) 1000 MG tablet Take 1 tablet (1,000 mg total) by mouth 2 (two) times daily with a meal. 180 tablet 1  . pantoprazole (PROTONIX) 40 MG tablet Take 1 tablet (40 mg total) by mouth daily. 90 tablet 1   No current facility-administered medications for this visit.    Patient confirms/reports the following allergies:  No Known Allergies  No orders of the defined types were placed in this encounter.   AUTHORIZATION INFORMATION Primary Insurance: 1D#: Group #:  Secondary Insurance: 1D#: Group #:  SCHEDULE INFORMATION: Date: 01/15/2019 Time: Location:[facility]

## 2019-01-11 ENCOUNTER — Other Ambulatory Visit: Payer: Self-pay

## 2019-01-11 ENCOUNTER — Other Ambulatory Visit
Admission: RE | Admit: 2019-01-11 | Discharge: 2019-01-11 | Disposition: A | Discharge status: Home / Self Care | Payer: Medicare Other | Source: Ambulatory Visit | Attending: Gastroenterology | Admitting: Gastroenterology

## 2019-01-11 DIAGNOSIS — Z01812 Encounter for preprocedural laboratory examination: Secondary | ICD-10-CM | POA: Diagnosis present

## 2019-01-11 DIAGNOSIS — Z20822 Contact with and (suspected) exposure to covid-19: Secondary | ICD-10-CM | POA: Diagnosis not present

## 2019-01-12 LAB — SARS CORONAVIRUS 2 (TAT 6-24 HRS): SARS Coronavirus 2: NEGATIVE

## 2019-01-15 ENCOUNTER — Encounter
Admission: RE | Disposition: A | Discharge status: Home / Self Care | Payer: Self-pay | Source: Home / Self Care | Attending: Gastroenterology

## 2019-01-15 ENCOUNTER — Ambulatory Visit
Admission: RE | Admit: 2019-01-15 | Discharge: 2019-01-15 | Disposition: A | Discharge status: Home / Self Care | Payer: Medicare Other | Attending: Gastroenterology | Admitting: Gastroenterology

## 2019-01-15 ENCOUNTER — Ambulatory Visit: Payer: Medicare Other | Admitting: Anesthesiology

## 2019-01-15 ENCOUNTER — Encounter: Payer: Self-pay | Admitting: Gastroenterology

## 2019-01-15 ENCOUNTER — Other Ambulatory Visit: Payer: Self-pay

## 2019-01-15 DIAGNOSIS — E785 Hyperlipidemia, unspecified: Secondary | ICD-10-CM | POA: Insufficient documentation

## 2019-01-15 DIAGNOSIS — K635 Polyp of colon: Secondary | ICD-10-CM

## 2019-01-15 DIAGNOSIS — K621 Rectal polyp: Secondary | ICD-10-CM | POA: Diagnosis not present

## 2019-01-15 DIAGNOSIS — D124 Benign neoplasm of descending colon: Secondary | ICD-10-CM | POA: Insufficient documentation

## 2019-01-15 DIAGNOSIS — E1122 Type 2 diabetes mellitus with diabetic chronic kidney disease: Secondary | ICD-10-CM | POA: Insufficient documentation

## 2019-01-15 DIAGNOSIS — Z7984 Long term (current) use of oral hypoglycemic drugs: Secondary | ICD-10-CM | POA: Diagnosis not present

## 2019-01-15 DIAGNOSIS — I129 Hypertensive chronic kidney disease with stage 1 through stage 4 chronic kidney disease, or unspecified chronic kidney disease: Secondary | ICD-10-CM | POA: Insufficient documentation

## 2019-01-15 DIAGNOSIS — Z79899 Other long term (current) drug therapy: Secondary | ICD-10-CM | POA: Diagnosis not present

## 2019-01-15 DIAGNOSIS — Z1211 Encounter for screening for malignant neoplasm of colon: Secondary | ICD-10-CM | POA: Insufficient documentation

## 2019-01-15 DIAGNOSIS — K641 Second degree hemorrhoids: Secondary | ICD-10-CM | POA: Diagnosis not present

## 2019-01-15 DIAGNOSIS — D631 Anemia in chronic kidney disease: Secondary | ICD-10-CM | POA: Insufficient documentation

## 2019-01-15 DIAGNOSIS — N189 Chronic kidney disease, unspecified: Secondary | ICD-10-CM | POA: Diagnosis not present

## 2019-01-15 HISTORY — PX: COLONOSCOPY WITH PROPOFOL: SHX5780

## 2019-01-15 LAB — GLUCOSE, CAPILLARY: Glucose-Capillary: 196 mg/dL — ABNORMAL HIGH (ref 70–99)

## 2019-01-15 SURGERY — COLONOSCOPY WITH PROPOFOL
Anesthesia: General

## 2019-01-15 MED ORDER — SODIUM CHLORIDE 0.9 % IV SOLN: INTRAVENOUS | Status: DC

## 2019-01-15 MED ORDER — LIDOCAINE HCL (CARDIAC) PF 100 MG/5ML IV SOSY
PREFILLED_SYRINGE | INTRAVENOUS | Status: DC | PRN
  Administered 2019-01-15: 50 mg via INTRAVENOUS

## 2019-01-15 MED ORDER — PROPOFOL 10 MG/ML IV BOLUS
INTRAVENOUS | Status: DC | PRN
  Administered 2019-01-15: 50 mg via INTRAVENOUS
  Administered 2019-01-15: 20 mg via INTRAVENOUS

## 2019-01-15 MED ORDER — PROPOFOL 500 MG/50ML IV EMUL
INTRAVENOUS | Status: AC
  Filled 2019-01-15: qty 50

## 2019-01-15 MED ORDER — PROPOFOL 500 MG/50ML IV EMUL
INTRAVENOUS | Status: DC | PRN
  Administered 2019-01-15: 175 ug/kg/min via INTRAVENOUS

## 2019-01-15 NOTE — Anesthesia Preprocedure Evaluation (Signed)
Anesthesia Evaluation  Patient identified by MRN, date of birth, ID band Patient awake    Reviewed: Allergy & Precautions, NPO status , Patient's Chart, lab work & pertinent test results  History of Anesthesia Complications Negative for: history of anesthetic complications  Airway Mallampati: III       Dental  (+) Poor Dentition, Chipped, Missing   Pulmonary neg sleep apnea, neg COPD, Not current smoker,           Cardiovascular hypertension, Pt. on medications (-) Past MI and (-) CHF (-) dysrhythmias (-) Valvular Problems/Murmurs     Neuro/Psych neg Seizures    GI/Hepatic Neg liver ROS, neg GERD  ,  Endo/Other  diabetes, Type 2, Oral Hypoglycemic Agents  Renal/GU Renal InsufficiencyRenal disease     Musculoskeletal   Abdominal   Peds  Hematology  (+) anemia ,   Anesthesia Other Findings   Reproductive/Obstetrics                             Anesthesia Physical Anesthesia Plan  ASA: III  Anesthesia Plan: General   Post-op Pain Management:    Induction: Intravenous  PONV Risk Score and Plan: 2 and Propofol infusion and TIVA  Airway Management Planned: Nasal Cannula  Additional Equipment:   Intra-op Plan:   Post-operative Plan:   Informed Consent: I have reviewed the patients History and Physical, chart, labs and discussed the procedure including the risks, benefits and alternatives for the proposed anesthesia with the patient or authorized representative who has indicated his/her understanding and acceptance.       Plan Discussed with:   Anesthesia Plan Comments:         Anesthesia Quick Evaluation

## 2019-01-15 NOTE — H&P (Signed)
   Kiran Anna, MD [address], [address], [place], [place], [address] [address], [address], [place], [place], [address] Phone: [phone]  Fax: [phone]  Primary Care Physician:  Tapia, Leisa, PA-C   Pre-Procedure History & Physical: HPI:  Cameron Glover is a 68 y.o. male is here for an colonoscopy.   Past Medical History:  Diagnosis Date  . Acute renal failure (ARF) (HCC)   . AKI (acute kidney injury) (HCC) 09/02/2017  . Anemia due to chronic kidney disease 04/15/2018  . Diabetes mellitus without complication (HCC)   . Elevated lipase 04/15/2018  . Hyperglycemia without ketosis   . Hyperlipidemia   . Hypertension     History reviewed. No pertinent surgical history.  Prior to Admission medications   Medication Sig Start Date End Date Taking? Authorizing Provider  atorvastatin (LIPITOR) 40 MG tablet Take 1 tablet (40 mg total) by mouth daily. 09/22/18  Yes Tapia, Leisa, PA-C  blood glucose meter kit and supplies KIT Dispense based on patient and insurance preference. Use up to four times daily as directed. (FOR ICD-9 250.00, 250.01). 09/04/17  Yes Danford, Christopher P, MD  lisinopril (ZESTRIL) 10 MG tablet Take 1 tablet (10 mg total) by mouth daily. 09/22/18  Yes Tapia, Leisa, PA-C  metFORMIN (GLUCOPHAGE) 1000 MG tablet Take 1 tablet (1,000 mg total) by mouth 2 (two) times daily with a meal. 09/22/18  Yes Tapia, Leisa, PA-C  pantoprazole (PROTONIX) 40 MG tablet Take 1 tablet (40 mg total) by mouth daily. 12/22/18  Yes Tapia, Leisa, PA-C    Allergies as of 12/27/2018  . (No Known Allergies)    Family History  Problem Relation Age of Onset  . Kidney failure Sister   . Cancer Sister   . Diabetes Sister   . Hypertension Sister   . Hyperlipidemia Sister   . Heart disease Sister   . Kidney disease Sister   . Cancer Brother   . CAD Brother   . Diabetes Brother   . Hypertension Brother   . Hyperlipidemia Brother   . Heart disease Brother   . Diabetes Other   .  Cancer Mother     Social History   Socioeconomic History  . Marital status: Single    Spouse name: Not on file  . Number of children: Not on file  . Years of education: Not on file  . Highest education level: High school graduate  Occupational History  . Occupation: Retired    Comment: Steel Worker  Tobacco Use  . Smoking status: Never Smoker  . Smokeless tobacco: Never Used  Substance and Sexual Activity  . Alcohol use: Never  . Drug use: Never  . Sexual activity: Yes    Comment: couple male partners  Other Topics Concern  . Not on file  Social History Narrative  . Not on file   Social Determinants of Health   Financial Resource Strain:   . Difficulty of Paying Living Expenses: Not on file  Food Insecurity:   . Worried About Running Out of Food in the Last Year: Not on file  . Ran Out of Food in the Last Year: Not on file  Transportation Needs:   . Lack of Transportation (Medical): Not on file  . Lack of Transportation (Non-Medical): Not on file  Physical Activity:   . Days of Exercise per Week: Not on file  . Minutes of Exercise per Session: Not on file  Stress:   . Feeling of Stress : Not on file    Social Connections:   . Frequency of Communication with Friends and Family: Not on file  . Frequency of Social Gatherings with Friends and Family: Not on file  . Attends Religious Services: Not on file  . Active Member of Clubs or Organizations: Not on file  . Attends Club or Organization Meetings: Not on file  . Marital Status: Not on file  Intimate Partner Violence:   . Fear of Current or Ex-Partner: Not on file  . Emotionally Abused: Not on file  . Physically Abused: Not on file  . Sexually Abused: Not on file    Review of Systems: See HPI, otherwise negative ROS  Physical Exam: BP (!) 169/97   Pulse (!) 112   Temp (!) 97.1 F (36.2 C)   Resp 18   Ht 5' 4" (1.626 m)   Wt 84.4 kg   SpO2 100%   BMI 31.93 kg/m  General:   Alert,  pleasant and  cooperative in NAD Head:  Normocephalic and atraumatic. Neck:  Supple; no masses or thyromegaly. Lungs:  Clear throughout to auscultation, normal respiratory effort.    Heart:  +S1, +S2, Regular rate and rhythm, No edema. Abdomen:  Soft, nontender and nondistended. Normal bowel sounds, without guarding, and without rebound.   Neurologic:  Alert and  oriented x4;  grossly normal neurologically.  Impression/Plan: Cameron Glover is here for an colonoscopy to be performed for Screening colonoscopy average risk   Risks, benefits, limitations, and alternatives regarding  colonoscopy have been reviewed with the patient.  Questions have been answered.  All parties agreeable.   Kiran Anna, MD  01/15/2019, 8:37 AM       

## 2019-01-15 NOTE — Transfer of Care (Signed)
Immediate Anesthesia Transfer of Care Note  Patient: Cameron Glover  Procedure(s) Performed: COLONOSCOPY WITH PROPOFOL (N/A )  Patient Location: PACU  Anesthesia Type:General  Level of Consciousness: sedated and unresponsive  Airway & Oxygen Therapy: Patient Spontanous Breathing  Post-op Assessment: Report given to RN and Post -op Vital signs reviewed and stable  Post vital signs: Reviewed and stable  Last Vitals:  Vitals Value Taken Time  BP 97/67 01/15/19 0919  Temp    Pulse 106 01/15/19 0919  Resp 23 01/15/19 0919  SpO2 97 % 01/15/19 0919  Vitals shown include unvalidated device data.  Last Pain:  Vitals:   01/15/19 0818  PainSc: 0-No pain         Complications: No apparent anesthesia complications

## 2019-01-15 NOTE — Anesthesia Procedure Notes (Signed)
Date/Time: 01/15/2019 8:45 AM Performed by: Johnna Acosta, CRNA Pre-anesthesia Checklist: Patient identified, Emergency Drugs available, Suction available, Patient being monitored and Timeout performed Patient Re-evaluated:Patient Re-evaluated prior to induction Oxygen Delivery Method: Nasal cannula Preoxygenation: Pre-oxygenation with 100% oxygen Induction Type: IV induction

## 2019-01-15 NOTE — Op Note (Signed)
[facility] Gastroenterology Patient Name: Cameron Glover Procedure Date: 01/15/2019 8:42 AM MRN: [id number] Account #: [id number] Date of Birth: 10-20-1950 Admit Type: Outpatient Age: 69 Room: [facility] ENDO ROOM 4 Gender: Male Note Status: Finalized Procedure:             Colonoscopy Indications:           Screening for colorectal malignant neoplasm Providers:             Jonathon Bellows MD, MD Medicines:             Monitored Anesthesia Care Complications:         No immediate complications. Procedure:             Pre-Anesthesia Assessment:                        - Prior to the procedure, a History and Physical was                         performed, and patient medications, allergies and                         sensitivities were reviewed. The patient's tolerance                         of previous anesthesia was reviewed.                        - The risks and benefits of the procedure and the                         sedation options and risks were discussed with the                         patient. All questions were answered and informed                         consent was obtained.                        - ASA Grade Assessment: II - A patient with mild                         systemic disease.                        After obtaining informed consent, the colonoscope was                         passed under direct vision. Throughout the procedure,                         the patient's blood pressure, pulse, and oxygen                         saturations were monitored continuously. The                         Colonoscope was introduced through the anus and  advanced to the the cecum, identified by the                         appendiceal orifice. The colonoscopy was performed                         with ease. The patient tolerated the procedure well.                         The quality of the bowel preparation was good. Findings:      The  perianal and digital rectal examinations were normal.      Non-bleeding internal hemorrhoids were found during retroflexion. The       hemorrhoids were large and Grade II (internal hemorrhoids that prolapse       but reduce spontaneously).      Two sessile polyps were found in the descending colon. The polyps were 5       to 7 mm in size. These polyps were removed with a cold snare. Resection       and retrieval were complete.      Three sessile polyps were found in the rectum. The polyps were 4 to 6 mm       in size. These polyps were removed with a cold snare. Resection and       retrieval were complete.      The exam was otherwise without abnormality on direct and retroflexion       views. Impression:            - Non-bleeding internal hemorrhoids.                        - Two 5 to 7 mm polyps in the descending colon,                         removed with a cold snare. Resected and retrieved.                        - Three 4 to 6 mm polyps in the rectum, removed with a                         cold snare. Resected and retrieved.                        - The examination was otherwise normal on direct and                         retroflexion views. Recommendation:        - Discharge patient to home (with escort).                        - Resume previous diet.                        - Continue present medications.                        - Await pathology results.                        - Repeat colonoscopy for  surveillance based on                         pathology results. Procedure Code(s):     --- Professional ---                        [phone], Colonoscopy, flexible; with removal of                         tumor(s), polyp(s), or other lesion(s) by snare                         technique Diagnosis Code(s):     --- Professional ---                        Z12.11, Encounter for screening for malignant neoplasm                         of colon                        K63.5, Polyp of colon                         K62.1, Rectal polyp                        K64.1, Second degree hemorrhoids CPT copyright 2019 [employer] Medical [employer]. All rights reserved. The codes documented in this report are preliminary and upon coder review may  be revised to meet current compliance requirements. Jonathon Bellows, MD Jonathon Bellows MD, MD 01/15/2019 9:14:44 AM This report has been signed electronically. Number of Addenda: 0 Note Initiated On: 01/15/2019 8:42 AM Scope Withdrawal Time: 0 hours 19 minutes 59 seconds  Total Procedure Duration: 0 hours 22 minutes 20 seconds  Estimated Blood Loss:  Estimated blood loss: none.      [facility]

## 2019-01-15 NOTE — Anesthesia Postprocedure Evaluation (Signed)
Anesthesia Post Note  Patient: Cameron Glover  Procedure(s) Performed: COLONOSCOPY WITH PROPOFOL (N/A )  Patient location during evaluation: Endoscopy Anesthesia Type: General Level of consciousness: awake and alert Pain management: pain level controlled Vital Signs Assessment: post-procedure vital signs reviewed and stable Respiratory status: spontaneous breathing and respiratory function stable Cardiovascular status: stable Anesthetic complications: no     Last Vitals:  Vitals:   01/15/19 0919 01/15/19 0929  BP: 97/67 130/88  Pulse: (!) 108 (!) 106  Resp: (!) 23 20  Temp: (!) 36.2 C   SpO2: 97% 98%    Last Pain:  Vitals:   01/15/19 0929  TempSrc:   PainSc: 0-No pain                 Ruth Kovich K

## 2019-01-16 ENCOUNTER — Encounter: Payer: Self-pay | Admitting: *Deleted

## 2019-01-17 LAB — SURGICAL PATHOLOGY

## 2019-01-22 ENCOUNTER — Encounter: Payer: Self-pay | Admitting: Gastroenterology

## 2019-02-12 ENCOUNTER — Ambulatory Visit
Admission: RE | Admit: 2019-02-12 | Discharge: 2019-02-12 | Disposition: A | Discharge status: Home / Self Care | Payer: Medicare Other | Source: Ambulatory Visit | Attending: Family Medicine | Admitting: Family Medicine

## 2019-02-12 ENCOUNTER — Encounter: Payer: Self-pay | Admitting: Family Medicine

## 2019-02-12 ENCOUNTER — Ambulatory Visit (INDEPENDENT_AMBULATORY_CARE_PROVIDER_SITE_OTHER): Payer: Medicare Other | Admitting: Family Medicine

## 2019-02-12 ENCOUNTER — Other Ambulatory Visit: Payer: Self-pay

## 2019-02-12 VITALS — BP 180/100 | HR 113 | Temp 97.1°F | Resp 20 | Ht 64.0 in | Wt 180.2 lb

## 2019-02-12 DIAGNOSIS — Z5181 Encounter for therapeutic drug level monitoring: Secondary | ICD-10-CM

## 2019-02-12 DIAGNOSIS — E1122 Type 2 diabetes mellitus with diabetic chronic kidney disease: Secondary | ICD-10-CM | POA: Diagnosis not present

## 2019-02-12 DIAGNOSIS — E1169 Type 2 diabetes mellitus with other specified complication: Secondary | ICD-10-CM

## 2019-02-12 DIAGNOSIS — R000 Tachycardia, unspecified: Secondary | ICD-10-CM

## 2019-02-12 DIAGNOSIS — R0600 Dyspnea, unspecified: Secondary | ICD-10-CM

## 2019-02-12 DIAGNOSIS — R0609 Other forms of dyspnea: Secondary | ICD-10-CM

## 2019-02-12 DIAGNOSIS — N182 Chronic kidney disease, stage 2 (mild): Secondary | ICD-10-CM

## 2019-02-12 DIAGNOSIS — I160 Hypertensive urgency: Secondary | ICD-10-CM

## 2019-02-12 DIAGNOSIS — E1165 Type 2 diabetes mellitus with hyperglycemia: Secondary | ICD-10-CM | POA: Diagnosis not present

## 2019-02-12 DIAGNOSIS — I10 Essential (primary) hypertension: Secondary | ICD-10-CM

## 2019-02-12 DIAGNOSIS — E785 Hyperlipidemia, unspecified: Secondary | ICD-10-CM

## 2019-02-12 DIAGNOSIS — I517 Cardiomegaly: Secondary | ICD-10-CM

## 2019-02-12 MED ORDER — AMLODIPINE BESYLATE 10 MG PO TABS: 10.0000 mg | ORAL_TABLET | Freq: Every day | ORAL | 3 refills | Status: DC

## 2019-02-12 MED ORDER — LISINOPRIL-HYDROCHLOROTHIAZIDE 20-12.5 MG PO TABS: 1.0000 | ORAL_TABLET | Freq: Every day | ORAL | 3 refills | Status: DC

## 2019-02-12 NOTE — Progress Notes (Signed)
Patient ID: Cameron Glover, male    DOB: [birth year], 69 y.o.   MRN: [id number]  PCP: [name], PA-C  Chief Complaint  Patient presents with  . Hypertension    Subjective:   [name] is a 69 y.o. male, presents to clinic with CC of the following:  Patient was sent to the office to have his blood pressure checked.  He was getting his eye exam done stated that his blood pressure was so high it was causing damage to his vision.  He states he wanted to wait until his next routine appointment but he was encouraged to come in soon as possible.  Here today with very elevated BP, initial blood pressure today was 204/118 patient was tachycardic as well.   His last office visit for hypertension was in September at that time his blood pressure was slightly elevated and we had increased his lisinopril from 2.5 mg to 10 mg and he was due for recheck.  We were previously having trouble with hypotension after some weight loss and diarrhea.  Patient did increase his medication but when he returned to clinic he had not been taking his medications his blood pressure was about the same systolic blood pressure was in the 160s.  Plan was to continue to work on med compliance and f/up closely -unfortunate subsequent visits he did virtually without any way to check his blood pressure. BP Readings from Last 3 Encounters:  02/12/19 (!) 180/100  02/12/19 (!) 204/118  01/15/19 130/88  10/24/18 (!) 162/98  Cameron Glover has continued to be compliant with his lisinopril medication.  He denies any other known changes denies any increased salt in his diet.  He does sometimes get headaches but it comes and goes no significant or concerning changes.  He has had some vision changes and is seeing ophthalmology for that.  He denies any lower extremity edema but does endorse some intermittent orthopnea and exertional dyspnea.  He has had tachycardia here few times in the he readily admits that he gets anxious his heart  rate has improved since sitting in the exam room together currently improved.  He denies any palpitations, near syncope, chest pain with near syncope diaphoresis nausea vomiting back pain shortness of breath. No significant weight gain in fact has lost about 2 pounds since her last visit. He also recently got his colonoscopy done little less than a month ago -  Pulse  106Abnormal   108Abnormal   112Abnormal       Pulse Rate Source  --  --  [facility]      ECG Heart Rate  108Abnormal   108Abnormal   --      Resp  20  23Abnormal   18      BP  130/88  97/67  169/97Abnormal        His vital signs trends throughout his procedure show elevated BP preop which improved during and after the procedure likely has some whitecoat hypertension  DM - on metformin 1000 mg BID, he checks his blood sugar intermittently will sometimes run 140-200 in the morning before eating.  He was previously on Jardiance but could never for this medication, did take glipizide briefly in the past and had been hospitalized with GI illness and hyperglycemia and was discharged with insulin but he never was able to manage his diabetes at home with insulin. Since est care after his hospitalization his A1C has dramatically improved and his last A1C was 7.7 only taking metformin  1000  Mg BID. Recent pertinent labs: Lab Results  Component Value Date   HGBA1C 7.7 (A) 09/22/2018   HGBA1C 8.5 (H) 04/12/2018   HGBA1C 13.6 (H) 09/03/2017    UTD on DM foot exam and eye exam -does have diabetic retinopathy ACEI/ARB: Yes - lisinopril dose titrated to BP tolerance  Statin: Yes  CKD - previous consult with nephrology due to strong family hx of renal disease, but renal function has been reassuring in the past Renal function recent labs: Lab Results  Component Value Date   GFRAA 76 09/22/2018   GFRAA 65 04/12/2018   GFRAA 92 10/04/2017   Lab Results  Component Value Date   CREATININE 1.16 09/22/2018   BUN 15 09/22/2018   NA 139  09/22/2018   K 4.2 09/22/2018   CL 105 09/22/2018   CO2 23 09/22/2018   HLD - pt has been compliant with his statin medication w/o any SE/myalgias or concerns. Lab Results  Component Value Date   CHOL 104 09/22/2018   HDL 34 (L) 09/22/2018   LDLCALC 51 09/22/2018   TRIG 111 09/22/2018   CHOLHDL 3.1 09/22/2018    GERD:   Continued controlled GI sx with compliance to daily protonix, no melena, hematochezia, bowel changed, epigastric abd pain, dysphagia, unintentional weight loss.    Patient Active Problem List   Diagnosis Date Noted  . Hyperlipidemia associated with type 2 diabetes mellitus ([place]) 09/22/2018  . CKD stage 2 due to type 2 diabetes mellitus ([place]) 09/22/2018  . Uncontrolled type 2 diabetes mellitus with hyperglycemia ([place]) 04/15/2018  . Essential hypertension 04/15/2018  . DM (diabetes mellitus) with complications ([place]) 95/62/1308      Current Outpatient Medications:  .  atorvastatin (LIPITOR) 40 MG tablet, Take 1 tablet (40 mg total) by mouth daily., Disp: 90 tablet, Rfl: 1 .  blood glucose meter kit and supplies KIT, Dispense based on patient and insurance preference. Use up to four times daily as directed. (FOR ICD-9 250.00, 250.01)., Disp: 1 each, Rfl: 0 .  lisinopril (ZESTRIL) 10 MG tablet, Take 1 tablet (10 mg total) by mouth daily., Disp: 90 tablet, Rfl: 3 .  metFORMIN (GLUCOPHAGE) 1000 MG tablet, Take 1 tablet (1,000 mg total) by mouth 2 (two) times daily with a meal., Disp: 180 tablet, Rfl: 1 .  pantoprazole (PROTONIX) 40 MG tablet, Take 1 tablet (40 mg total) by mouth daily., Disp: 90 tablet, Rfl: 1   No Known Allergies   Family History  Problem Relation Age of Onset  . Kidney failure Sister   . Cancer Sister   . Diabetes Sister   . Hypertension Sister   . Hyperlipidemia Sister   . Heart disease Sister   . Kidney disease Sister   . Cancer Brother   . CAD Brother   . Diabetes Brother   . Hypertension Brother   . Hyperlipidemia Brother   .  Heart disease Brother   . Diabetes Other   . Cancer Mother      Social History   Socioeconomic History  . Marital status: Single    Spouse name: Not on file  . Number of children: Not on file  . Years of education: Not on file  . Highest education level: High school graduate  Occupational History  . Occupation: Retired    Comment: Administrator, [employer]  Tobacco Use  . Smoking status: Never Smoker  . Smokeless tobacco: Never Used  Substance and Sexual Activity  . Alcohol use: Never  . Drug use: Never  .  Sexual activity: Yes    Comment: couple male partners  Other Topics Concern  . Not on file  Social History Narrative  . Not on file   Social Determinants of Health   Financial Resource Strain:   . Difficulty of Paying Living Expenses: Not on file  Food Insecurity:   . Worried About Charity fundraiser in the Last Year: Not on file  . Ran Out of Food in the Last Year: Not on file  Transportation Needs:   . Lack of Transportation (Medical): Not on file  . Lack of Transportation (Non-Medical): Not on file  Physical Activity:   . Days of Exercise per Week: Not on file  . Minutes of Exercise per Session: Not on file  Stress:   . Feeling of Stress : Not on file  Social Connections:   . Frequency of Communication with Friends and Family: Not on file  . Frequency of Social Gatherings with Friends and Family: Not on file  . Attends Religious Services: Not on file  . Active Member of Clubs or Organizations: Not on file  . Attends Archivist Meetings: Not on file  . Marital Status: Not on file  Intimate Partner Violence:   . Fear of Current or Ex-Partner: Not on file  . Emotionally Abused: Not on file  . Physically Abused: Not on file  . Sexually Abused: Not on file    Chart Review Today: I personally reviewed active problem list, medication list, allergies, family history, social history, health maintenance, notes from last encounter, lab results, imaging with the  patient/caregiver today.  Review of Systems  Constitutional: Negative.   HENT: Negative.   Eyes: Negative.   Respiratory: Negative.   Cardiovascular: Negative.   Gastrointestinal: Negative.   Endocrine: Negative.   Genitourinary: Negative.   Musculoskeletal: Negative.   Skin: Negative.   Allergic/Immunologic: Negative.   Neurological: Negative.   Hematological: Negative.   Psychiatric/Behavioral: Negative.   All other systems reviewed and are negative.      Objective:   Vitals:   02/12/19 1303 02/12/19 1317  BP: (!) 204/118 (!) 180/100  Pulse: (!) 113   Resp: 20   Temp: (!) 97.1 F (36.2 C)   TempSrc: Temporal   SpO2: 98%   Weight: 180 lb 3.2 oz (81.7 kg)   Height: '5\' 4"'  (1.626 m)     Body mass index is 30.93 kg/m.  Physical Exam Vitals and nursing note reviewed.  Constitutional:      General: He is not in acute distress.    Appearance: Normal appearance. He is well-developed. He is not ill-appearing, toxic-appearing or diaphoretic.     Interventions: Face mask in place.  HENT:     Head: Normocephalic and atraumatic. No right periorbital erythema or left periorbital erythema.     Right Ear: External ear normal.     Left Ear: External ear normal.  Eyes:     General: Lids are normal. No scleral icterus.    Extraocular Movements: Extraocular movements intact.     Conjunctiva/sclera: Conjunctivae normal.     Pupils: Pupils are equal, round, and reactive to light.  Neck:     Trachea: Trachea and phonation normal. No tracheal deviation.  Cardiovascular:     Rate and Rhythm: Normal rate and regular rhythm.     Pulses:          Radial pulses are 2+ on the right side and 2+ on the left side.       Posterior  tibial pulses are 2+ on the right side and 2+ on the left side.     Heart sounds: Heart sounds not distant. No friction rub.     Comments: HR 88 at time of exam RRR no M, G, R Symmetrical radial and PT pulses 2+, no LE edema Pulmonary:     Effort: Pulmonary  effort is normal. No tachypnea, accessory muscle usage or respiratory distress.     Breath sounds: Normal breath sounds and air entry. No stridor, decreased air movement or transmitted upper airway sounds. No decreased breath sounds, wheezing, rhonchi or rales.  Abdominal:     General: Bowel sounds are normal. There is no distension.     Palpations: Abdomen is soft.     Tenderness: There is no abdominal tenderness. There is no guarding or rebound.  Musculoskeletal:        General: Normal range of motion.     Cervical back: Full passive range of motion without pain, normal range of motion and neck supple.     Right lower leg: No edema.     Left lower leg: No edema.  Skin:    General: Skin is warm and dry.     Capillary Refill: Capillary refill takes less than 2 seconds.     Coloration: Skin is not jaundiced.     Findings: No rash.     Nails: There is no clubbing.  Neurological:     Mental Status: He is alert.     Cranial Nerves: No dysarthria or facial asymmetry.     Motor: No tremor or abnormal muscle tone.     Gait: Gait normal.  Psychiatric:        Mood and Affect: Mood normal.        Speech: Speech normal.        Behavior: Behavior normal. Behavior is cooperative.      ECG interpretation  Date: 02/12/19  Rate: 88  Rhythm: normal sinus rhythm  QRS Axis: left axis deviation  Intervals: normal  ST/T Wave abnormalities: TWI to inferior leads -  II, III and aVF (prior ECG showed TWI to III and aVF) J-point elevation to V2 (increased since last) - no contiguous J-point elevation, ST elevation or ST depression or reciprocal changes LVH per voltage criteria, no LV strain pattern  Old EKG Reviewed:  Change from prior EKG - compared to 09/22/2018 ECG - only II TWI is new      Assessment & Plan:    1. Hypertensive urgency Concern for accelerated hypertension, patient with some symptoms of heart failure -do believe some of patient's hypertension is secondary to anxiety/whitecoat  hypertension Unfortunately several visits over the past 3 to 5 months patient was unable to come in for blood pressure recheck after we adjusted medications. We previously had to decrease his medications due to hypotension.  So his blood pressure has been difficult to control since he establish care Checking for signs of endorgan damage his eyesight has already been assessed by his ophthalmologist, concern for signs of LVH possibly some heart failure symptoms with orthopnea and exertional dyspnea, patient appears euvolemic -no rales in the lungs or lower extremity edema but will check BNP and chest x-ray EKG shows LVH per voltage criteria but no left heart strain pattern  Increasing blood pressure dose DC lisinopril 10 mg and start lisinopril HCTZ 20-12.5 mg daily and add Norvasc 10 mg with close follow-up in 2 weeks  I discussed with the patient thoroughly and gave him 2 handouts  today about hypertensive emergency with signs and symptoms that he would need to immediately go to the ER for assessment of these were circled annotated and given to him and reviewed verbally as well  - CBC with Differential/Platelet - COMPLETE METABOLIC PANEL WITH GFR - Lipid panel - Hemoglobin A1c - TSH - Brain natriuretic peptide - EKG 12-Lead - DG Chest 2 View - POCT urinalysis dipstick - POCT UA - Microalbumin - lisinopril-hydrochlorothiazide (ZESTORETIC) 20-12.5 MG tablet; Take 1 tablet by mouth daily.  Dispense: 90 tablet; Refill: 3 - amLODipine (NORVASC) 10 MG tablet; Take 1 tablet (10 mg total) by mouth daily.  Dispense: 90 tablet; Refill: 3 - Ambulatory referral to Cardiology  2. Type 2 diabetes mellitus with hyperglycemia, without long-term current use of insulin (HCC) Unstable, uncontrolled although last A1c was improving to 7.7 Up-to-date on eye exam and foot exam we will recheck labs today do suspect that based on his fasting blood sugar he will not be at goal of below 7.0  3. Hyperlipidemia  associated with type 2 diabetes mellitus ([place]) Compliant with statin medication no myalgias, jaundice concerns or side effects - COMPLETE METABOLIC PANEL WITH GFR - Lipid panel  4. CKD stage 2 due to type 2 diabetes mellitus ([place]) Recheck renal function today with elevated blood pressure - COMPLETE METABOLIC PANEL WITH GFR - POCT urinalysis dipstick - POCT UA - Microalbumin  5. Tachycardia Heart rate improved during encounter today EKG showed normal sinus rhythm 88 and 91, do suspect he gets tachycardic and more hypertensive due to nervousness with doctors office visit - CBC with Differential/Platelet - COMPLETE METABOLIC PANEL WITH GFR - Lipid panel - Hemoglobin A1c - TSH - Brain natriuretic peptide - EKG 12-Lead - DG Chest 2 View - POCT urinalysis dipstick - POCT UA - Microalbumin   7. Encounter for medication monitoring - CBC with Differential/Platelet - COMPLETE METABOLIC PANEL WITH GFR - Lipid panel - Hemoglobin A1c - TSH - Brain natriuretic peptide - EKG 12-Lead - DG Chest 2 View - POCT urinalysis dipstick - POCT UA - Microalbumin  8. Ventricular hypertrophy With his symptoms, accelerated hypertension different from his baseline only a few months ago do feel a cardiology referral is appropriate for further assessment. - Ambulatory referral to Cardiology  9. DOE (dyspnea on exertion) and orthopnea concern for signs of heart failure refer to cardiology - Ambulatory referral to Cardiology  10. Accelerated hypertension Change in medication, patient strongly encouraged to be compliant with his medicines, adhere to low-salt diet, 2-week follow-up - Ambulatory referral to Cardiology     [name], PA-C 02/12/19 1:16 PM

## 2019-02-12 NOTE — Patient Instructions (Signed)
Stop lisinopril 10 mg  Start lisinopril-HCTZ  20-12.5 mg and norvasc 10 mg daily  Keep your other medicines the same and we will let you know if we need to change anything when we get your results   Hypertension, Adult Hypertension is another name for high blood pressure. High blood pressure forces your heart to work harder to pump blood. This can cause problems over time. There are two numbers in a blood pressure reading. There is a top number (systolic) over a bottom number (diastolic). It is best to have a blood pressure that is below 120/80. Healthy choices can help lower your blood pressure, or you may need medicine to help lower it. What are the causes? The cause of this condition is not known. Some conditions may be related to high blood pressure. What increases the risk?  Smoking.  Having type 2 diabetes mellitus, high cholesterol, or both.  Not getting enough exercise or physical activity.  Being overweight.  Having too much fat, sugar, calories, or salt (sodium) in your diet.  Drinking too much alcohol.  Having long-term (chronic) kidney disease.  Having a family history of high blood pressure.  Age. Risk increases with age.  Race. You may be at higher risk if you are African American.  Gender. Men are at higher risk than women before age 78. After age 96, women are at higher risk than men.  Having obstructive sleep apnea.  Stress. What are the signs or symptoms?  High blood pressure may not cause symptoms. Very high blood pressure (hypertensive crisis) may cause: ? Headache. ? Feelings of worry or nervousness (anxiety). ? Shortness of breath. ? Nosebleed. ? A feeling of being sick to your stomach (nausea). ? Throwing up (vomiting). ? Changes in how you see. ? Very bad chest pain. ? Seizures. How is this treated?  This condition is treated by making healthy lifestyle changes, such as: ? Eating healthy foods. ? Exercising more. ? Drinking less  alcohol.  Your health care provider may prescribe medicine if lifestyle changes are not enough to get your blood pressure under control, and if: ? Your top number is above 130. ? Your bottom number is above 80.  Your personal target blood pressure may vary. Follow these instructions at home: Eating and drinking   If told, follow the DASH eating plan. To follow this plan: ? Fill one half of your plate at each meal with fruits and vegetables. ? Fill one fourth of your plate at each meal with whole grains. Whole grains include whole-wheat pasta, brown rice, and whole-grain bread. ? Eat or drink low-fat dairy products, such as skim milk or low-fat yogurt. ? Fill one fourth of your plate at each meal with low-fat (lean) proteins. Low-fat proteins include fish, chicken without skin, eggs, beans, and tofu. ? Avoid fatty meat, cured and processed meat, or chicken with skin. ? Avoid pre-made or processed food.  Eat less than 1,500 mg of salt each day.  Do not drink alcohol if: ? Your doctor tells you not to drink. ? You are pregnant, may be pregnant, or are planning to become pregnant.  If you drink alcohol: ? Limit how much you use to:  0-1 drink a day for women.  0-2 drinks a day for men. ? Be aware of how much alcohol is in your drink. In the [place] one drink equals one 12 oz bottle of beer (355 mL), one 5 oz glass of wine (148 mL), or one 1 oz glass  of hard liquor (44 mL). Lifestyle   Work with your doctor to stay at a healthy weight or to lose weight. Ask your doctor what the best weight is for you.  Get at least 30 minutes of exercise most days of the week. This may include walking, swimming, or biking.  Get at least 30 minutes of exercise that strengthens your muscles (resistance exercise) at least 3 days a week. This may include lifting weights or doing Pilates.  Do not use any products that contain nicotine or tobacco, such as cigarettes, e-cigarettes, and chewing tobacco. If  you need help quitting, ask your doctor.  Check your blood pressure at home as told by your doctor.  Keep all follow-up visits as told by your doctor. This is important. Medicines  Take over-the-counter and prescription medicines only as told by your doctor. Follow directions carefully.  Do not skip doses of blood pressure medicine. The medicine does not work as well if you skip doses. Skipping doses also puts you at risk for problems.  Ask your doctor about side effects or reactions to medicines that you should watch for. Contact a doctor if you:  Think you are having a reaction to the medicine you are taking.  Have headaches that keep coming back (recurring).  Feel dizzy.  Have swelling in your ankles.  Have trouble with your vision. Get help right away if you:  Get a very bad headache.  Start to feel mixed up (confused).  Feel weak or numb.  Feel faint.  Have very bad pain in your: ? Chest. ? Belly (abdomen).  Throw up more than once.  Have trouble breathing. Summary  Hypertension is another name for high blood pressure.  High blood pressure forces your heart to work harder to pump blood.  For most people, a normal blood pressure is less than 120/80.  Making healthy choices can help lower blood pressure. If your blood pressure does not get lower with healthy choices, you may need to take medicine. This information is not intended to replace advice given to you by your health care provider. Make sure you discuss any questions you have with your health care provider. Document Revised: 08/31/2017 Document Reviewed: 08/31/2017 Elsevier Patient Education  2020 [employer].

## 2019-02-13 LAB — LIPID PANEL
Cholesterol: 89 mg/dL (ref <200)
HDL: 34 mg/dL — ABNORMAL LOW (ref >=40)
LDL Cholesterol (Calc): 38 mg/dL (calc)
Non-HDL Cholesterol (Calc): 55 mg/dL (calc) (ref <130)
Total CHOL/HDL Ratio: 2.6 (calc) (ref <5.0)
Triglycerides: 86 mg/dL (ref <150)

## 2019-02-13 LAB — POCT UA - MICROALBUMIN: Microalbumin Ur, POC: 100 mg/L

## 2019-02-13 LAB — COMPLETE METABOLIC PANEL WITH GFR
AG Ratio: 1.5 (calc) (ref 1.0–2.5)
ALT: 12 U/L (ref 9–46)
AST: 13 U/L (ref 10–35)
Albumin: 4.1 g/dL (ref 3.6–5.1)
Alkaline phosphatase (APISO): 83 U/L (ref 35–144)
BUN: 16 mg/dL (ref 7–25)
CO2: 26 mmol/L (ref 20–32)
Calcium: 9.1 mg/dL (ref 8.6–10.3)
Chloride: 104 mmol/L (ref 98–110)
Creat: 1.22 mg/dL (ref 0.70–1.25)
GFR, Est African American: 71 mL/min/{1.73_m2} (ref >=60)
GFR, Est Non African American: 61 mL/min/{1.73_m2} (ref >=60)
Globulin: 2.8 g/dL (calc) (ref 1.9–3.7)
Glucose, Bld: 154 mg/dL — ABNORMAL HIGH (ref 65–99)
Potassium: 4.3 mmol/L (ref 3.5–5.3)
Sodium: 138 mmol/L (ref 135–146)
Total Bilirubin: 0.5 mg/dL (ref 0.2–1.2)
Total Protein: 6.9 g/dL (ref 6.1–8.1)

## 2019-02-13 LAB — CBC WITH DIFFERENTIAL/PLATELET
Absolute Monocytes: 351 cells/uL (ref 200–950)
Basophils Absolute: 41 cells/uL (ref 0–200)
Basophils Relative: 0.9 %
Eosinophils Absolute: 131 cells/uL (ref 15–500)
Eosinophils Relative: 2.9 %
HCT: 38.1 % — ABNORMAL LOW (ref 38.5–50.0)
Hemoglobin: 12.7 g/dL — ABNORMAL LOW (ref 13.2–17.1)
Lymphs Abs: 1805 cells/uL (ref 850–3900)
MCH: 30.3 pg (ref 27.0–33.0)
MCHC: 33.3 g/dL (ref 32.0–36.0)
MCV: 90.9 fL (ref 80.0–100.0)
MPV: 10.9 fL (ref 7.5–12.5)
Monocytes Relative: 7.8 %
Neutro Abs: 2174 cells/uL (ref 1500–7800)
Neutrophils Relative %: 48.3 %
Platelets: 229 10*3/uL (ref 140–400)
RBC: 4.19 10*6/uL — ABNORMAL LOW (ref 4.20–5.80)
RDW: 11.8 % (ref 11.0–15.0)
Total Lymphocyte: 40.1 %
WBC: 4.5 10*3/uL (ref 3.8–10.8)

## 2019-02-13 LAB — POCT URINALYSIS DIPSTICK
Bilirubin, UA: NEGATIVE
Blood, UA: POSITIVE
Glucose, UA: POSITIVE — AB
Ketones, UA: NEGATIVE
Leukocytes, UA: NEGATIVE
Nitrite, UA: NEGATIVE
Odor: NORMAL
Protein, UA: POSITIVE — AB
Spec Grav, UA: 1.025 (ref 1.010–1.025)
Urobilinogen, UA: 0.2 E.U./dL
pH, UA: 6.5 (ref 5.0–8.0)

## 2019-02-13 LAB — HEMOGLOBIN A1C
Hgb A1c MFr Bld: 6.9 % of total Hgb — ABNORMAL HIGH (ref <5.7)
Mean Plasma Glucose: 151 (calc)
eAG (mmol/L): 8.4 (calc)

## 2019-02-13 LAB — BRAIN NATRIURETIC PEPTIDE: Brain Natriuretic Peptide: 18 pg/mL (ref <100)

## 2019-02-13 LAB — TSH: TSH: 1.62 mIU/L (ref 0.40–4.50)

## 2019-02-15 ENCOUNTER — Ambulatory Visit: Payer: Medicare Other | Admitting: Interventional Cardiology

## 2019-02-15 ENCOUNTER — Encounter: Payer: Self-pay | Admitting: Interventional Cardiology

## 2019-02-15 ENCOUNTER — Other Ambulatory Visit: Payer: Self-pay

## 2019-02-15 VITALS — BP 148/80 | HR 98 | Ht 64.0 in | Wt 178.0 lb

## 2019-02-15 DIAGNOSIS — I119 Hypertensive heart disease without heart failure: Secondary | ICD-10-CM

## 2019-02-15 DIAGNOSIS — E1139 Type 2 diabetes mellitus with other diabetic ophthalmic complication: Secondary | ICD-10-CM | POA: Diagnosis not present

## 2019-02-15 DIAGNOSIS — E782 Mixed hyperlipidemia: Secondary | ICD-10-CM

## 2019-02-15 NOTE — Patient Instructions (Signed)
Medication Instructions:  Your physician recommends that you continue on your current medications as directed. Please refer to the Current Medication list given to you today.  *If you need a refill on your cardiac medications before your next appointment, please call your pharmacy*  Lab Work: None ordered  If you have labs (blood work) drawn today and your tests are completely normal, you will receive your results only by: . MyChart Message (if you have MyChart) OR . A paper copy in the mail If you have any lab test that is abnormal or we need to change your treatment, we will call you to review the results.  Testing/Procedures: None ordered  Follow-Up: At [facility], you and your health needs are our priority.  As part of our continuing mission to provide you with exceptional heart care, we have created designated Provider Care Teams.  These Care Teams include your primary Cardiologist (physician) and Advanced Practice Providers (APPs -  Physician Assistants and Nurse Practitioners) who all work together to provide you with the care you need, when you need it.  Your next appointment:   6 month(s)  The format for your next appointment:   In Person  Provider:   You may see Jayadeep Varanasi, MD or one of the following Advanced Practice Providers on your designated Care Team:    Dayna Dunn, PA-C  Michele Lenze, PA-C   Other Instructions   

## 2019-02-15 NOTE — Progress Notes (Signed)
Cardiology Office Note   Date:  02/15/2019   ID:  Cameron Glover, DOB 1950-01-21, MRN [id number]  PCP:  [name], PA-C    No chief complaint on file.  Hypertensive heart disease  Wt Readings from Last 3 Encounters:  02/15/19 178 lb (80.7 kg)  02/12/19 180 lb 3.2 oz (81.7 kg)  01/15/19 186 lb (84.4 kg)       History of Present Illness: Cameron Glover is a 69 y.o. male who is being seen today for the evaluation of hypertensive heart disease at the request of [name], PA-C.  Last September, he was diagnosed with HTN on an eye exam.  He also has DM, CRI.  Family h/o kidney problems.  No early heart disease of which he knows.   Yesterday, amlodipine 10 mg daily was added and plain lisinopril was changed to lisinopril/HCTZ.   Denies : Chest pain. Dizziness. Leg edema. Nitroglycerin use. Orthopnea. Palpitations. Paroxysmal nocturnal dyspnea. Shortness of breath. Syncope.         Past Medical History:  Diagnosis Date  . Acute renal failure (ARF) ([place])   . AKI (acute kidney injury) ([place]) 09/02/2017  . Anemia due to chronic kidney disease 04/15/2018  . Diabetes mellitus without complication ([place])   . Elevated lipase 04/15/2018  . Hyperglycemia without ketosis   . Hyperlipidemia   . Hypertension     Past Surgical History:  Procedure Laterality Date  . COLONOSCOPY WITH PROPOFOL N/A 01/15/2019   Procedure: COLONOSCOPY WITH PROPOFOL;  Surgeon: Jonathon Bellows, MD;  Location: [facility] ENDOSCOPY;  Service: Gastroenterology;  Laterality: N/A;     Current Outpatient Medications  Medication Sig Dispense Refill  . amLODipine (NORVASC) 10 MG tablet Take 1 tablet (10 mg total) by mouth daily. 90 tablet 3  . atorvastatin (LIPITOR) 40 MG tablet Take 1 tablet (40 mg total) by mouth daily. 90 tablet 1  . blood glucose meter kit and supplies KIT Dispense based on patient and insurance preference. Use up to four times daily as directed. (FOR ICD-9 250.00, 250.01). 1 each 0  .  lisinopril-hydrochlorothiazide (ZESTORETIC) 20-12.5 MG tablet Take 1 tablet by mouth daily. 90 tablet 3  . metFORMIN (GLUCOPHAGE) 1000 MG tablet Take 1 tablet (1,000 mg total) by mouth 2 (two) times daily with a meal. 180 tablet 1  . pantoprazole (PROTONIX) 40 MG tablet Take 1 tablet (40 mg total) by mouth daily. 90 tablet 1   No current facility-administered medications for this visit.    Allergies:   Patient has no known allergies.    Social History:  The patient  reports that he has never smoked. He has never used smokeless tobacco. He reports that he does not drink alcohol or use drugs.   Family History:  The patient's family history includes CAD in his brother; Cancer in his brother, mother, and sister; Diabetes in his brother, sister, and another family member; Heart disease in his brother and sister; Hyperlipidemia in his brother and sister; Hypertension in his brother and sister; Kidney disease in his sister; Kidney failure in his sister.    ROS:  Please see the history of present illness.   Otherwise, review of systems are positive for increased BP, poor diet.   All other systems are reviewed and negative.    PHYSICAL EXAM: VS:  BP (!) 148/80   Pulse 98   Ht '5\' 4"'  (1.626 m)   Wt 178 lb (80.7 kg)   SpO2 98%   BMI 30.55 kg/m  , BMI  Body mass index is 30.55 kg/m. GEN: Well nourished, well developed, in no acute distress  HEENT: normal  Neck: no JVD, carotid bruits, or masses Cardiac: RRR; no murmurs, rubs, or gallops,no edema  Respiratory:  clear to auscultation bilaterally, normal work of breathing GI: soft, nontender, nondistended, + BS MS: no deformity or atrophy  Skin: warm and dry, no rash Neuro:  Strength and sensation are intact Psych: euthymic mood, full affect   EKG:   The ekg ordered 02/12/2019 demonstrates NSR, LVH   Recent Labs: 02/12/2019: ALT 12; Brain Natriuretic Peptide 18; BUN 16; Creat 1.22; Hemoglobin 12.7; Platelets 229; Potassium 4.3; Sodium 138; TSH  1.62   Lipid Panel    Component Value Date/Time   CHOL 89 02/12/2019 1412   TRIG 86 02/12/2019 1412   HDL 34 (L) 02/12/2019 1412   CHOLHDL 2.6 02/12/2019 1412   LDLCALC 38 02/12/2019 1412     Other studies Reviewed: Additional studies/ records that were reviewed today with results demonstrating: LDL 38 in 02/2019.   ASSESSMENT AND PLAN:  1. Hypertensive heart disease:  I agree with the medicine choices from his PMD.  Meds were added yesterday and the effects are not yet fully seen.  BP better today.  Continue current meds.  Low salt diet recommended.  2. DM: Controlled.  A1C 6.9.  COntinue healthy diet.  3. Hyperlipidemia: COntinue atorvastatin.  4. BNP 18.  No sign of fluid overload. LVH noted.  INcrease exercise.  Decrease salt intake.    Current medicines are reviewed at length with the patient today.  The patient concerns regarding his medicines were addressed.  The following changes have been made:  No change  Labs/ tests ordered today include:  No orders of the defined types were placed in this encounter.   Recommend 150 minutes/week of aerobic exercise Low fat, low carb, high fiber diet recommended  Disposition:   FU in 6 months   Signed, Larae Grooms, MD  02/15/2019 3:49 PM    [facility] [address], [place], [place]  [address] Phone: [phone]; Fax: [phone]

## 2019-02-26 ENCOUNTER — Ambulatory Visit: Payer: Medicare Other

## 2019-03-04 ENCOUNTER — Ambulatory Visit: Payer: Medicare Other | Attending: Internal Medicine

## 2019-03-04 DIAGNOSIS — Z23 Encounter for immunization: Secondary | ICD-10-CM | POA: Insufficient documentation

## 2019-03-04 NOTE — Progress Notes (Signed)
   Covid-19 Vaccination Clinic  Name:  Cameron Glover    MRN: [id number] DOB: [birth year]  03/04/2019  [name] was observed post Covid-19 immunization for 15 minutes without incidence. He was provided with Vaccine Information Sheet and instruction to access the V-Safe system.   [name] was instructed to call 911 with any severe reactions post vaccine: Marland Kitchen Difficulty breathing  . Swelling of your face and throat  . A fast heartbeat  . A bad rash all over your body  . Dizziness and weakness    Immunizations Administered    Name Date Dose VIS Date Route   Pfizer COVID-19 Vaccine 03/04/2019  9:29 AM 0.3 mL 12/15/2018 Intramuscular   Manufacturer: [place]   Lot: [id number]   [place]: [id number]

## 2019-03-23 ENCOUNTER — Ambulatory Visit (INDEPENDENT_AMBULATORY_CARE_PROVIDER_SITE_OTHER): Payer: Medicare Other | Admitting: Family Medicine

## 2019-03-23 ENCOUNTER — Encounter: Payer: Self-pay | Admitting: Family Medicine

## 2019-03-23 ENCOUNTER — Other Ambulatory Visit: Payer: Self-pay

## 2019-03-23 VITALS — BP 138/82 | HR 97 | Temp 97.5°F | Resp 14 | Ht 64.0 in | Wt 174.9 lb

## 2019-03-23 DIAGNOSIS — I10 Essential (primary) hypertension: Secondary | ICD-10-CM | POA: Diagnosis not present

## 2019-03-23 DIAGNOSIS — Z5181 Encounter for therapeutic drug level monitoring: Secondary | ICD-10-CM

## 2019-03-23 DIAGNOSIS — E785 Hyperlipidemia, unspecified: Secondary | ICD-10-CM

## 2019-03-23 DIAGNOSIS — E1169 Type 2 diabetes mellitus with other specified complication: Secondary | ICD-10-CM

## 2019-03-23 DIAGNOSIS — I160 Hypertensive urgency: Secondary | ICD-10-CM | POA: Diagnosis not present

## 2019-03-23 DIAGNOSIS — E1122 Type 2 diabetes mellitus with diabetic chronic kidney disease: Secondary | ICD-10-CM

## 2019-03-23 DIAGNOSIS — E119 Type 2 diabetes mellitus without complications: Secondary | ICD-10-CM | POA: Diagnosis not present

## 2019-03-23 DIAGNOSIS — N182 Chronic kidney disease, stage 2 (mild): Secondary | ICD-10-CM

## 2019-03-23 DIAGNOSIS — I517 Cardiomegaly: Secondary | ICD-10-CM

## 2019-03-23 MED ORDER — METFORMIN HCL 1000 MG PO TABS: 1000.0000 mg | ORAL_TABLET | Freq: Two times a day (BID) | ORAL | 3 refills | Status: DC

## 2019-03-23 MED ORDER — ATORVASTATIN CALCIUM 40 MG PO TABS: 40.0000 mg | ORAL_TABLET | Freq: Every day | ORAL | 3 refills | Status: DC

## 2019-03-23 MED ORDER — BLOOD GLUCOSE TEST VI STRP: ORAL_STRIP | 3 refills | Status: DC

## 2019-03-23 NOTE — Patient Instructions (Signed)
BP should be around or below 140/90 - best would be below 130/80  Your blood sugars should be 100-150 in the morning before eating breakfast.   WE will follow up in 4 months, however if anything is higher than the numbers above, we need to see you sooner to recheck this.    Blood Glucose Monitoring, Adult Monitoring your blood sugar (glucose) is an important part of managing your diabetes (diabetes mellitus). Blood glucose monitoring involves checking your blood glucose as often as directed and keeping a record (log) of your results over time. Checking your blood glucose regularly and keeping a blood glucose log can:  Help you and your health care provider adjust your diabetes management plan as needed, including your medicines or insulin.  Help you understand how food, exercise, illnesses, and medicines affect your blood glucose.  Let you know what your blood glucose is at any time. You can quickly find out if you have low blood glucose (hypoglycemia) or high blood glucose (hyperglycemia). Your health care provider will set individualized treatment goals for you. Your goals will be based on your age, other medical conditions you have, and how you respond to diabetes treatment. Generally, the goal of treatment is to maintain the following blood glucose levels:  Before meals (preprandial): 80-130 mg/dL (4.4-7.2 mmol/L).  After meals (postprandial): below 180 mg/dL (10 mmol/L).  A1c level: less than 7%. Supplies needed:  Blood glucose meter.  Test strips for your meter. Each meter has its own strips. You must use the strips that came with your meter.  A needle to prick your finger (lancet). Do not use a lancet more than one time.  A device that holds the lancet (lancing device).  A journal or log book to write down your results. How to check your blood glucose  1. Wash your hands with soap and water. 2. Prick the side of your finger (not the tip) with the lancet. Use a different  finger each time. 3. Gently rub the finger until a small drop of blood appears. 4. Follow instructions that come with your meter for inserting the test strip, applying blood to the strip, and using your blood glucose meter. 5. Write down your result and any notes. Some meters allow you to use areas of your body other than your finger (alternative sites) to test your blood. The most common alternative sites are:  Forearm.  Thigh.  Palm of the hand. If you think you may have hypoglycemia, or if you have a history of not knowing when your blood glucose is getting low (hypoglycemia unawareness), do not use alternative sites. Use your finger instead. Alternative sites may not be as accurate as the fingers, because blood flow is slower in these areas. This means that the result you get may be delayed, and it may be different from the result that you would get from your finger. Follow these instructions at home: Blood glucose log   Every time you check your blood glucose, write down your result. Also write down any notes about things that may be affecting your blood glucose, such as your diet and exercise for the day. This information can help you and your health care provider: ? Look for patterns in your blood glucose over time. ? Adjust your diabetes management plan as needed.  Check if your meter allows you to download your records to a computer. Most glucose meters store a record of glucose readings in the meter. If you have type 1 diabetes:  Check your blood glucose 2 or more times a day.  Also check your blood glucose: ? Before every insulin injection. ? Before and after exercise. ? Before meals. ? 2 hours after a meal. ? Occasionally between 2:00 a.m. and 3:00 a.m., as directed. ? Before potentially dangerous tasks, like driving or using heavy machinery. ? At bedtime.  You may need to check your blood glucose more often, up to 6-10 times a day, if you: ? Use an insulin  pump. ? Need multiple daily injections (MDI). ? Have diabetes that is not well-controlled. ? Are ill. ? Have a history of severe hypoglycemia. ? Have hypoglycemia unawareness. If you have type 2 diabetes:  If you take insulin or other diabetes medicines, check your blood glucose 2 or more times a day.  If you are on intensive insulin therapy, check your blood glucose 4 or more times a day. Occasionally, you may also need to check between 2:00 a.m. and 3:00 a.m., as directed.  Also check your blood glucose: ? Before and after exercise. ? Before potentially dangerous tasks, like driving or using heavy machinery.  You may need to check your blood glucose more often if: ? Your medicine is being adjusted. ? Your diabetes is not well-controlled. ? You are ill. General tips  Always keep your supplies with you.  If you have questions or need help, all blood glucose meters have a 24-hour "hotline" phone number that you can call. You may also contact your health care provider.  After you use a few boxes of test strips, adjust (calibrate) your blood glucose meter by following instructions that came with your meter. Contact a health care provider if:  Your blood glucose is at or above 240 mg/dL (13.3 mmol/L) for 2 days in a row.  You have been sick or have had a fever for 2 days or longer, and you are not getting better.  You have any of the following problems for more than 6 hours: ? You cannot eat or drink. ? You have nausea or vomiting. ? You have diarrhea. Get help right away if:  Your blood glucose is lower than 54 mg/dL (3 mmol/L).  You become confused or you have trouble thinking clearly.  You have difficulty breathing.  You have moderate or large ketone levels in your urine. Summary  Monitoring your blood sugar (glucose) is an important part of managing your diabetes (diabetes mellitus).  Blood glucose monitoring involves checking your blood glucose as often as directed  and keeping a record (log) of your results over time.  Your health care provider will set individualized treatment goals for you. Your goals will be based on your age, other medical conditions you have, and how you respond to diabetes treatment.  Every time you check your blood glucose, write down your result. Also write down any notes about things that may be affecting your blood glucose, such as your diet and exercise for the day. This information is not intended to replace advice given to you by your health care provider. Make sure you discuss any questions you have with your health care provider. Document Revised: 10/14/2017 Document Reviewed: 06/02/2015 Elsevier Patient Education  2020 [employer].   Managing Your Hypertension Hypertension is commonly called high blood pressure. This is when the force of your blood pressing against the walls of your arteries is too strong. Arteries are blood vessels that carry blood from your heart throughout your body. Hypertension forces the heart to work harder to pump  blood, and may cause the arteries to become narrow or stiff. Having untreated or uncontrolled hypertension can cause heart attack, stroke, kidney disease, and other problems. What are blood pressure readings? A blood pressure reading consists of a higher number over a lower number. Ideally, your blood pressure should be below 120/80. The first ("top") number is called the systolic pressure. It is a measure of the pressure in your arteries as your heart beats. The second ("bottom") number is called the diastolic pressure. It is a measure of the pressure in your arteries as the heart relaxes. What does my blood pressure reading mean? Blood pressure is classified into four stages. Based on your blood pressure reading, your health care provider may use the following stages to determine what type of treatment you need, if any. Systolic pressure and diastolic pressure are measured in a unit called  mm Hg. Normal  Systolic pressure: below 123456.  Diastolic pressure: below 80. Elevated  Systolic pressure: [id number].  Diastolic pressure: below 80. Hypertension stage 1  Systolic pressure: 0000000.  Diastolic pressure: [id number]. Hypertension stage 2  Systolic pressure: [id number] or above.  Diastolic pressure: 90 or above. What health risks are associated with hypertension? Managing your hypertension is an important responsibility. Uncontrolled hypertension can lead to:  A heart attack.  A stroke.  A weakened blood vessel (aneurysm).  Heart failure.  Kidney damage.  Eye damage.  Metabolic syndrome.  Memory and concentration problems. What changes can I make to manage my hypertension? Hypertension can be managed by making lifestyle changes and possibly by taking medicines. Your health care provider will help you make a plan to bring your blood pressure within a normal range. Eating and drinking   Eat a diet that is high in fiber and potassium, and low in salt (sodium), added sugar, and fat. An example eating plan is called the DASH (Dietary Approaches to Stop Hypertension) diet. To eat this way: ? Eat plenty of fresh fruits and vegetables. Try to fill half of your plate at each meal with fruits and vegetables. ? Eat whole grains, such as whole wheat pasta, brown rice, or whole grain bread. Fill about one quarter of your plate with whole grains. ? Eat low-fat diary products. ? Avoid fatty cuts of meat, processed or cured meats, and poultry with skin. Fill about one quarter of your plate with lean proteins such as fish, chicken without skin, beans, eggs, and tofu. ? Avoid premade and processed foods. These tend to be higher in sodium, added sugar, and fat.  Reduce your daily sodium intake. Most people with hypertension should eat less than 1,500 mg of sodium a day.  Limit alcohol intake to no more than 1 drink a day for nonpregnant women and 2 drinks a day for men. One drink  equals 12 oz of beer, 5 oz of wine, or 1 oz of hard liquor. Lifestyle  Work with your health care provider to maintain a healthy body weight, or to lose weight. Ask what an ideal weight is for you.  Get at least 30 minutes of exercise that causes your heart to beat faster (aerobic exercise) most days of the week. Activities may include walking, swimming, or biking.  Include exercise to strengthen your muscles (resistance exercise), such as weight lifting, as part of your weekly exercise routine. Try to do these types of exercises for 30 minutes at least 3 days a week.  Do not use any products that contain nicotine or tobacco, such as cigarettes and e-cigarettes.  If you need help quitting, ask your health care provider.  Control any long-term (chronic) conditions you have, such as high cholesterol or diabetes. Monitoring  Monitor your blood pressure at home as told by your health care provider. Your personal target blood pressure may vary depending on your medical conditions, your age, and other factors.  Have your blood pressure checked regularly, as often as told by your health care provider. Working with your health care provider  Review all the medicines you take with your health care provider because there may be side effects or interactions.  Talk with your health care provider about your diet, exercise habits, and other lifestyle factors that may be contributing to hypertension.  Visit your health care provider regularly. Your health care provider can help you create and adjust your plan for managing hypertension. Will I need medicine to control my blood pressure? Your health care provider may prescribe medicine if lifestyle changes are not enough to get your blood pressure under control, and if:  Your systolic blood pressure is 130 or higher.  Your diastolic blood pressure is 80 or higher. Take medicines only as told by your health care provider. Follow the directions carefully.  Blood pressure medicines must be taken as prescribed. The medicine does not work as well when you skip doses. Skipping doses also puts you at risk for problems. Contact a health care provider if:  You think you are having a reaction to medicines you have taken.  You have repeated (recurrent) headaches.  You feel dizzy.  You have swelling in your ankles.  You have trouble with your vision. Get help right away if:  You develop a severe headache or confusion.  You have unusual weakness or numbness, or you feel faint.  You have severe pain in your chest or abdomen.  You vomit repeatedly.  You have trouble breathing. Summary  Hypertension is when the force of blood pumping through your arteries is too strong. If this condition is not controlled, it may put you at risk for serious complications.  Your personal target blood pressure may vary depending on your medical conditions, your age, and other factors. For most people, a normal blood pressure is less than 120/80.  Hypertension is managed by lifestyle changes, medicines, or both. Lifestyle changes include weight loss, eating a healthy, low-sodium diet, exercising more, and limiting alcohol. This information is not intended to replace advice given to you by your health care provider. Make sure you discuss any questions you have with your health care provider. Document Revised: 04/14/2018 Document Reviewed: 11/19/2015 Elsevier Patient Education  [place].

## 2019-03-23 NOTE — Progress Notes (Signed)
Name: Drayson Dorko   MRN: [id number]    DOB: 1950/02/02   Date:03/23/2019       Progress Note  Chief Complaint  Patient presents with  . Follow-up  . Diabetes    [id number]   . Hypertension  . Hyperlipidemia  . Gastroesophageal Reflux     Subjective:   Cameron Glover is a 69 y.o. male, presents to clinic for routine follow up on the conditions listed above.  F/up on elevated BP/hypertensive urgency, BP meds changed to lisinopril 20-12.31m from lisinopril 10 mg and amlodipine 10 mg added. His blood pressure has varied since est care with me about 1.5 years ago and BP has been bottomed out and very low at times, and other times have been very high.  Lows followed some GI illness and weight loss, recent spike in BP did not have any obvious causes.   He's had similar lability with blood sugar over the past 1.5 years, initially requiring insulin and now only on metformin, but DM is recently controlled, last A1C 6.9  Pt here for blood pressure recheck.  BP improved to 144/82 with med changes.  He also did see cardiology right after our last visit for concerns of HTN urgency and possible HF/LVH, BP was similarly elevated, cardiologist agreed with med changes and will see him in 6 months for f/up, otherwise he advised exercising and no other cardiac tests were ordered.   Repeat BP here today before going to get labs -> further improved to 138/82  BP Readings from Last 5 Encounters:  03/23/19 (!) 144/82  02/15/19 (!) 148/80  02/12/19 (!) 180/100  01/15/19 130/88  10/24/18 (!) 162/98   Taking meds as directed w/o problems, reports he is compliant with med changes. Pt denies chest pain, SOB, dizziness, or heart palpitations.  Denies medication side effects.  He does not have any exertional CP but with walking a mile at the park he is winded and SOB.  He says "maybe its his old age" and he notes that as he has been walking at the park his DOE is slightly getting better.  No weight changes, LE  edema, orthopnea, near syncope  Visit from one month ago: 1. Hypertensive urgency Concern for accelerated hypertension, patient with some symptoms of heart failure -do believe some of patient's hypertension is secondary to anxiety/whitecoat hypertension Unfortunately several visits over the past 3 to 5 months patient was unable to come in for blood pressure recheck after we adjusted medications. We previously had to decrease his medications due to hypotension.  So his blood pressure has been difficult to control since he establish care Checking for signs of endorgan damage his eyesight has already been assessed by his ophthalmologist, concern for signs of LVH possibly some heart failure symptoms with orthopnea and exertional dyspnea, patient appears euvolemic -no rales in the lungs or lower extremity edema but will check BNP and chest x-ray EKG shows LVH per voltage criteria but no left heart strain pattern  Increasing blood pressure dose DC lisinopril 10 mg and start lisinopril HCTZ 20-12.5 mg daily and add Norvasc 10 mg with close follow-up in 2 weeks   Blood sugars on metformin 1000 mg BID, tolerating with minimal SE, sometimes has a little GI cramping here and there, but no significant sx.   He checks about 3x a week this morning was 164   He recently had colonoscopy, saw Dr. AVicente Males He is still taking protonix for GERD and states GI did not change those recommendations.  He is scheduled to f/up with nephrology, with strong family hx of kidney disease. He has eye exam coming up as well.  He expresses he's got a lot of appts  Not due for recheck of other chronic conditions or labs today, did review with him his most recent lab, cardiology eval and recommendations  Patient Active Problem List   Diagnosis Date Noted  . Hyperlipidemia associated with type 2 diabetes mellitus ([place]) 09/22/2018  . CKD stage 2 due to type 2 diabetes mellitus ([place]) 09/22/2018  . Uncontrolled type 2 diabetes  mellitus with hyperglycemia ([place]) 04/15/2018  . Essential hypertension 04/15/2018  . DM (diabetes mellitus) with complications ([place]) 57/84/6962    Past Surgical History:  Procedure Laterality Date  . COLONOSCOPY WITH PROPOFOL N/A 01/15/2019   Procedure: COLONOSCOPY WITH PROPOFOL;  Surgeon: Jonathon Bellows, MD;  Location: [facility] ENDOSCOPY;  Service: Gastroenterology;  Laterality: N/A;    Family History  Problem Relation Age of Onset  . Kidney failure Sister   . Cancer Sister   . Diabetes Sister   . Hypertension Sister   . Hyperlipidemia Sister   . Heart disease Sister   . Kidney disease Sister   . Cancer Brother   . CAD Brother   . Diabetes Brother   . Hypertension Brother   . Hyperlipidemia Brother   . Heart disease Brother   . Diabetes Other   . Cancer Mother     Social History   Tobacco Use  . Smoking status: Never Smoker  . Smokeless tobacco: Never Used  Substance Use Topics  . Alcohol use: Never  . Drug use: Never      Current Outpatient Medications:  .  amLODipine (NORVASC) 10 MG tablet, Take 1 tablet (10 mg total) by mouth daily., Disp: 90 tablet, Rfl: 3 .  atorvastatin (LIPITOR) 40 MG tablet, Take 1 tablet (40 mg total) by mouth daily., Disp: 90 tablet, Rfl: 1 .  lisinopril-hydrochlorothiazide (ZESTORETIC) 20-12.5 MG tablet, Take 1 tablet by mouth daily., Disp: 90 tablet, Rfl: 3 .  metFORMIN (GLUCOPHAGE) 1000 MG tablet, Take 1 tablet (1,000 mg total) by mouth 2 (two) times daily with a meal., Disp: 180 tablet, Rfl: 1 .  pantoprazole (PROTONIX) 40 MG tablet, Take 1 tablet (40 mg total) by mouth daily., Disp: 90 tablet, Rfl: 1 .  blood glucose meter kit and supplies KIT, Dispense based on patient and insurance preference. Use up to four times daily as directed. (FOR ICD-9 250.00, 250.01)., Disp: 1 each, Rfl: 0  No Known Allergies  Chart Review Today: I personally reviewed active problem list, medication list, allergies, family history, social history, health  maintenance, notes from last encounter, lab results, imaging with the patient/caregiver today.   Review of Systems  10 Systems reviewed and are negative for acute change except as noted in the HPI.    Objective:    Vitals:   03/23/19 1007  BP: (!) 144/82  Pulse: 97  Resp: 14  Temp: (!) 97.5 F (36.4 C)  SpO2: 98%  Weight: 174 lb 14.4 oz (79.3 kg)  Height: '5\' 4"'  (1.626 m)    Body mass index is 30.02 kg/m.  Physical Exam Vitals and nursing note reviewed.  Constitutional:      General: He is not in acute distress.    Appearance: Normal appearance. He is well-developed. He is not ill-appearing, toxic-appearing or diaphoretic.     Interventions: Face mask in place.  HENT:     Head: Normocephalic and atraumatic.  Jaw: No trismus.     Right Ear: External ear normal.     Left Ear: External ear normal.  Eyes:     General: Lids are normal. No scleral icterus.    Conjunctiva/sclera: Conjunctivae normal.     Pupils: Pupils are equal, round, and reactive to light.  Neck:     Trachea: Trachea and phonation normal. No tracheal deviation.  Cardiovascular:     Rate and Rhythm: Normal rate and regular rhythm.     Pulses: Normal pulses.          Radial pulses are 2+ on the right side and 2+ on the left side.       Posterior tibial pulses are 2+ on the right side and 2+ on the left side.     Heart sounds: Normal heart sounds. No murmur. No friction rub. No gallop.   Pulmonary:     Effort: Pulmonary effort is normal. No respiratory distress.     Breath sounds: Normal breath sounds. No stridor. No wheezing, rhonchi or rales.  Abdominal:     General: Bowel sounds are normal. There is no distension.     Palpations: Abdomen is soft.     Tenderness: There is no abdominal tenderness. There is no guarding or rebound.  Musculoskeletal:        General: Normal range of motion.     Cervical back: Normal range of motion and neck supple.     Right lower leg: No edema.     Left lower leg:  No edema.  Skin:    General: Skin is warm and dry.     Capillary Refill: Capillary refill takes less than 2 seconds.     Coloration: Skin is not jaundiced.     Findings: No rash.     Nails: There is no clubbing.  Neurological:     Mental Status: He is alert.     Cranial Nerves: No dysarthria or facial asymmetry.     Motor: No tremor or abnormal muscle tone.     Gait: Gait normal.  Psychiatric:        Mood and Affect: Mood normal.        Speech: Speech normal.        Behavior: Behavior normal. Behavior is cooperative.        PHQ2/9: Depression screen [facility] 2/9 03/23/2019 02/12/2019 12/22/2018 09/22/2018 10/04/2017  Decreased Interest 0 0 0 0 0  Down, Depressed, Hopeless 0 0 0 0 2  PHQ - 2 Score 0 0 0 0 2  Altered sleeping 0 1 0 0 3  Tired, decreased energy 0 1 0 0 1  Change in appetite 0 0 0 0 0  Feeling bad or failure about yourself  0 0 0 0 0  Trouble concentrating 0 0 0 0 0  Moving slowly or fidgety/restless 0 0 0 0 0  Suicidal thoughts 0 0 0 0 0  PHQ-9 Score 0 2 0 0 6  Difficult doing work/chores Not difficult at all Not difficult at all Not difficult at all Not difficult at all Not difficult at all    phq 9 is neg, reviewed  Fall Risk: Fall Risk  03/23/2019 02/12/2019 12/22/2018 10/23/2018 09/22/2018  Falls in the past year? 0 0 0 0 0  Number falls in past yr: 0 0 0 0 0  Injury with Fall? 0 0 0 0 0    Functional Status Survey: Is the patient deaf or have difficulty hearing?: No Does the patient have  difficulty seeing, even when wearing glasses/contacts?: No Does the patient have difficulty concentrating, remembering, or making decisions?: No Does the patient have difficulty walking or climbing stairs?: No Does the patient have difficulty dressing or bathing?: No Does the patient have difficulty doing errands alone such as visiting a doctor's office or shopping?: No   Assessment & Plan:     ICD-10-CM   1. Essential hypertension  T80 BASIC METABOLIC PANEL WITH GFR    improved BP control Continue current meds Lisinopril-HCTZ 20-12.5 and amlodipine 10 mg daily We will check labs to recheck renal function after med changes We discussed monitoring at home with a goal to have him lower than 140/90 optimal would be to have him under 130/80 Would possibly consider increasing the lisinopril HCTZ Encourage him to continue low-salt diet and exercise   2. Controlled type 2 diabetes mellitus without complication, without long-term current use of insulin (HCC)  E11.9 metFORMIN (GLUCOPHAGE) 1000 MG tablet    atorvastatin (LIPITOR) 40 MG tablet    Glucose Blood (BLOOD GLUCOSE TEST STRIPS) STRP   last A1C 6.9, continue metformin   3. Hyperlipidemia associated with type 2 diabetes mellitus ([place])  E11.69     E78.5   4. Hypertensive urgency  Q44.7 BASIC METABOLIC PANEL WITH GFR   improved with BP changes, continue same meds, continue working on exercise per cardiology   5. CKD stage 2 due to type 2 diabetes mellitus (HCC)  Z58.06 BASIC METABOLIC PANEL WITH GFR  He has follow-up with nephrology next month  N18.2   6. Encounter for medication monitoring  B86.85 BASIC METABOLIC PANEL WITH GFR   Greater than 50% of this visit was spent in direct face-to-face counseling, obtaining history and physical, discussing and educating pt on treatment plan.  Total time of this visit was 30+ min, more than 20 min spent face to face with pt.  Remainder of time involved but was not limited to reviewing chart (recent and pertinent OV notes and labs), documentation in EMR, and coordinating care and treatment plan.   Return for 4 month  HTN/HLD/DM in person please.   [name], PA-C 03/23/19 10:20 AM

## 2019-03-24 LAB — BASIC METABOLIC PANEL WITH GFR
BUN: 18 mg/dL (ref 7–25)
CO2: 26 mmol/L (ref 20–32)
Calcium: 8.4 mg/dL — ABNORMAL LOW (ref 8.6–10.3)
Chloride: 102 mmol/L (ref 98–110)
Creat: 1.18 mg/dL (ref 0.70–1.25)
GFR, Est African American: 74 mL/min/{1.73_m2} (ref >=60)
GFR, Est Non African American: 64 mL/min/{1.73_m2} (ref >=60)
Glucose, Bld: 150 mg/dL — ABNORMAL HIGH (ref 65–99)
Potassium: 4.2 mmol/L (ref 3.5–5.3)
Sodium: 139 mmol/L (ref 135–146)

## 2019-03-28 ENCOUNTER — Ambulatory Visit: Payer: Medicare Other | Attending: Internal Medicine

## 2019-03-28 DIAGNOSIS — Z23 Encounter for immunization: Secondary | ICD-10-CM

## 2019-03-28 NOTE — Progress Notes (Signed)
   Covid-19 Vaccination Clinic  Name:  Li Aloi    MRN: [id number] DOB: [birth year]  03/28/2019  Mr. Wierzbowski was observed post Covid-19 immunization for 15 minutes without incident. He was provided with Vaccine Information Sheet and instruction to access the V-Safe system.   Mr. Ratkovich was instructed to call 911 with any severe reactions post vaccine: Marland Kitchen Difficulty breathing  . Swelling of face and throat  . A fast heartbeat  . A bad rash all over body  . Dizziness and weakness   Immunizations Administered    Name Date Dose VIS Date Route   Pfizer COVID-19 Vaccine 03/28/2019 12:03 PM 0.3 mL 12/15/2018 Intramuscular   Manufacturer: [place]   Lot: [id number]   [place]: [id number]

## 2019-04-05 ENCOUNTER — Ambulatory Visit (INDEPENDENT_AMBULATORY_CARE_PROVIDER_SITE_OTHER): Payer: Medicare Other

## 2019-04-05 VITALS — Ht 64.0 in | Wt 178.0 lb

## 2019-04-05 DIAGNOSIS — Z0000 Encounter for general adult medical examination without abnormal findings: Secondary | ICD-10-CM

## 2019-04-05 DIAGNOSIS — Z594 Lack of adequate food and safe drinking water: Secondary | ICD-10-CM

## 2019-04-05 DIAGNOSIS — Z5941 Food insecurity: Secondary | ICD-10-CM

## 2019-04-05 NOTE — Progress Notes (Signed)
Subjective:   Cameron Glover is a 69 y.o. male who presents for Medicare Annual/Subsequent preventive examination.  Virtual Visit via Telephone Note  I connected with Cameron Glover on 04/05/19 at  2:50 PM EDT by telephone and verified that I am speaking with the correct person using two identifiers.  Medicare Annual Wellness visit completed telephonically due to Covid-19 pandemic.   Location: Patient: home Provider: office   I discussed the limitations, risks, security and privacy concerns of performing an evaluation and management service by telephone and the availability of in person appointments. The patient expressed understanding and agreed to proceed.  Some vital signs may be absent or patient reported.   Clemetine Marker, LPN    Review of Systems:   Cardiac Risk Factors include: male gender;dyslipidemia;hypertension;obesity (BMI >30kg/m2)     Objective:    Vitals: Ht '5\' 4"'  (1.626 m)   Wt 178 lb (80.7 kg)   BMI 30.55 kg/m   Body mass index is 30.55 kg/m.  Advanced Directives 04/05/2019 01/15/2019 09/02/2017  Does Patient Have a Medical Advance Directive? No No No  Would patient like information on creating a medical advance directive? No - Patient declined No - Patient declined Yes (Inpatient - patient requests chaplain consult to create a medical advance directive)    Tobacco Social History   Tobacco Use  Smoking Status Never Smoker  Smokeless Tobacco Never Used     Counseling given: Not Answered   Clinical Intake:  Pre-visit preparation completed: Yes  Pain : No/denies pain     BMI - recorded: 30.55 Nutritional Status: BMI > 30  Obese Nutritional Risks: None Diabetes: Yes CBG done?: No   Nutrition Risk Assessment:  Has the patient had any N/V/D within the last 2 months?  No  Does the patient have any non-healing wounds?  No  Has the patient had any unintentional weight loss or weight gain?  No   Diabetes:  Is the patient diabetic?  Yes  If  diabetic, was a CBG obtained today?  No  Did the patient bring in their glucometer from home?  No  How often do you monitor your CBG's? Twice weekly    Financial Strains and Diabetes Management:  Are you having any financial strains with the device, your supplies or your medication? No .  Does the patient want to be seen by Chronic Care Management for management of their diabetes?  No  Would the patient like to be referred to a Nutritionist or for Diabetic Management?  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   Diabetic Foot Exam: Completed 04/12/18. Pt has been advised about the importance in completing this exam. Pt is scheduled for diabetic foot exam on 07/23/19.   How often do you need to have someone help you when you read instructions, pamphlets, or other written materials from your doctor or pharmacy?: 1 - Never  Interpreter Needed?: No  Information entered by :: Clemetine Marker LPN  Past Medical History:  Diagnosis Date  . Acute renal failure (ARF) ([place])   . AKI (acute kidney injury) ([place]) 09/02/2017  . Anemia due to chronic kidney disease 04/15/2018  . Diabetes mellitus without complication ([place])   . Elevated lipase 04/15/2018  . Hyperglycemia without ketosis   . Hyperlipidemia   . Hypertension    Past Surgical History:  Procedure Laterality Date  . COLONOSCOPY WITH PROPOFOL N/A 01/15/2019   Procedure: COLONOSCOPY WITH PROPOFOL;  Surgeon: Jonathon Bellows, MD;  Location: [facility]  ENDOSCOPY;  Service: Gastroenterology;  Laterality: N/A;   Family History  Problem Relation Age of Onset  . Kidney failure Sister   . Cancer Sister   . Diabetes Sister   . Hypertension Sister   . Hyperlipidemia Sister   . Heart disease Sister   . Kidney disease Sister   . Cancer Brother   . CAD Brother   . Diabetes Brother   . Hypertension Brother   . Hyperlipidemia Brother   . Heart disease Brother   . Diabetes Other   . Cancer Mother    Social History    Socioeconomic History  . Marital status: Single    Spouse name: Not on file  . Number of children: Not on file  . Years of education: Not on file  . Highest education level: High school graduate  Occupational History  . Occupation: Retired    Comment: Administrator, [employer]  Tobacco Use  . Smoking status: Never Smoker  . Smokeless tobacco: Never Used  Substance and Sexual Activity  . Alcohol use: Never  . Drug use: Never  . Sexual activity: Yes    Comment: couple male partners  Other Topics Concern  . Not on file  Social History Narrative  . Not on file   Social Determinants of Health   Financial Resource Strain: Low Risk   . Difficulty of Paying Living Expenses: Not very hard  Food Insecurity: Food Insecurity Present  . Worried About Charity fundraiser in the Last Year: Sometimes true  . Ran Out of Food in the Last Year: Never true  Transportation Needs: No Transportation Needs  . Lack of Transportation (Medical): No  . Lack of Transportation (Non-Medical): No  Physical Activity: Insufficiently Active  . Days of Exercise per Week: 4 days  . Minutes of Exercise per Session: 20 min  Stress: Stress Concern Present  . Feeling of Stress : To some extent  Social Connections: Moderately Isolated  . Frequency of Communication with Friends and Family: More than three times a week  . Frequency of Social Gatherings with Friends and Family: More than three times a week  . Attends Religious Services: Never  . Active Member of Clubs or Organizations: No  . Attends Archivist Meetings: Never  . Marital Status: Never married    Outpatient Encounter Medications as of 04/05/2019  Medication Sig  . amLODipine (NORVASC) 10 MG tablet Take 1 tablet (10 mg total) by mouth daily.  Marland Kitchen atorvastatin (LIPITOR) 40 MG tablet Take 1 tablet (40 mg total) by mouth daily.  . blood glucose meter kit and supplies KIT Dispense based on patient and insurance preference. Use up to four times daily as  directed. (FOR ICD-9 250.00, 250.01).  . Glucose Blood (BLOOD GLUCOSE TEST STRIPS) STRP Use as directed to monitor FSBS once daily, for ICD 10 E11.65  . lisinopril-hydrochlorothiazide (ZESTORETIC) 20-12.5 MG tablet Take 1 tablet by mouth daily.  . metFORMIN (GLUCOPHAGE) 1000 MG tablet Take 1 tablet (1,000 mg total) by mouth 2 (two) times daily with a meal.  . pantoprazole (PROTONIX) 40 MG tablet Take 1 tablet (40 mg total) by mouth daily.   No facility-administered encounter medications on file as of 04/05/2019.    Activities of Daily Living In your present state of health, do you have any difficulty performing the following activities: 04/05/2019 03/23/2019  Hearing? N N  Comment declines hearing aids -  Vision? N N  Difficulty concentrating or making decisions? N N  Walking or climbing stairs?  N N  Dressing or bathing? N N  Doing errands, shopping? N N  Preparing Food and eating ? N -  Using the Toilet? N -  In the past six months, have you accidently leaked urine? N -  Do you have problems with loss of bowel control? N -  Managing your Medications? N -  Managing your Finances? N -  Housekeeping or managing your Housekeeping? N -  Some recent data might be hidden    Patient Care Team: [name], PA-C as PCP - General (Family Medicine) Jettie Booze, MD as PCP - Cardiology (Cardiology)   Assessment:   This is a routine wellness examination for [place].  Exercise Activities and Dietary recommendations Current Exercise Habits: Home exercise routine, Type of exercise: walking, Time (Minutes): 20, Frequency (Times/Week): 4, Weekly Exercise (Minutes/Week): 80, Intensity: Mild, Exercise limited by: None identified  Goals    . Increase physical activity     Recommend increasing physical activity to 150 minutes per week        Fall Risk Fall Risk  04/05/2019 03/23/2019 02/12/2019 12/22/2018 10/23/2018  Falls in the past year? 0 0 0 0 0  Number falls in past yr: 0 0 0 0 0    Injury with Fall? 0 0 0 0 0  Risk for fall due to : No Fall Risks - - - -  Follow up Falls prevention discussed - - - -   FALL RISK PREVENTION PERTAINING TO THE HOME:  Any stairs in or around the home? Yes  If so, do they handrails? Yes   Home free of loose throw rugs in walkways, pet beds, electrical cords, etc? Yes  Adequate lighting in your home to reduce risk of falls? Yes   ASSISTIVE DEVICES UTILIZED TO PREVENT FALLS:  Life alert? No  Use of a cane, walker or w/c? No  Grab bars in the bathroom? No  Shower chair or bench in shower? No  Elevated toilet seat or a handicapped toilet? No   DME ORDERS:  DME order needed?  No   TIMED UP AND GO:  Was the test performed? No . Telephonic visit.   Education: Fall risk prevention has been discussed.  Intervention(s) required? No   Depression Screen PHQ 2/9 Scores 04/05/2019 03/23/2019 02/12/2019 12/22/2018  PHQ - 2 Score 0 0 0 0  PHQ- 9 Score - 0 2 0    Cognitive Function     6CIT Screen 04/05/2019  What Year? 0 points  What month? 0 points  What time? 0 points  Count back from 20 0 points  Months in reverse 4 points  Repeat phrase 2 points  Total Score 6    Immunization History  Administered Date(s) Administered  . Influenza,inj,Quad PF,6+ Mos 10/04/2017  . Influenza-Unspecified 10/04/2018  . PFIZER SARS-COV-2 Vaccination 03/04/2019, 03/28/2019  . Pneumococcal Polysaccharide-23 09/03/2017    Qualifies for Shingles Vaccine? Yes . Due for Shingrix. Education has been provided regarding the importance of this vaccine. Pt has been advised to call insurance company to determine out of pocket expense. Advised may also receive vaccine at local pharmacy or Health Dept. Verbalized acceptance and understanding.  Tdap: Although this vaccine is not a covered service during a Wellness Exam, does the patient still wish to receive this vaccine today?  No .  Education has been provided regarding the importance of this vaccine.  Advised may receive this vaccine at local pharmacy or Health Dept. Aware to provide a copy of the vaccination record  if obtained from local pharmacy or Health Dept. Verbalized acceptance and understanding.  Flu Vaccine: Up to date  Pneumococcal Vaccine: Due for Pneumococcal vaccine. Does the patient want to receive this vaccine today?  No . Education has been provided regarding the importance of this vaccine but still declined. Advised may receive this vaccine at local pharmacy or Health Dept. Aware to provide a copy of the vaccination record if obtained from local pharmacy or Health Dept. Verbalized acceptance and understanding.   Screening Tests Health Maintenance  Topic Date Due  . TETANUS/TDAP  04/15/2019 (Originally 11/14/1969)  . PNA vac Low Risk Adult (1 of 2 - PCV13) 12/22/2019 (Originally 09/04/2018)  . FOOT EXAM  04/12/2019  . OPHTHALMOLOGY EXAM  05/25/2019  . INFLUENZA VACCINE  08/05/2019  . HEMOGLOBIN A1C  08/12/2019  . COLONOSCOPY  01/15/2024  . Hepatitis C Screening  Completed   Cancer Screenings:  Colorectal Screening: Completed 01/15/19. Repeat every 5 years;   Lung Cancer Screening: (Low Dose CT Chest recommended if Age 70-80 years, 30 pack-year currently smoking OR have quit w/in 15years.) does not qualify.   Additional Screening:  Hepatitis C Screening: does qualify; Completed 04/12/18  Vision Screening: Recommended annual ophthalmology exams for early detection of glaucoma and other disorders of the eye. Is the patient up to date with their annual eye exam?  Yes    Who is the provider or what is the name of the office in which the pt attends annual eye exams? Dr. Deloria Lair   Dental Screening: Recommended annual dental exams for proper oral hygiene  Community Resource Referral:  CRR required this visit?  No        Plan:    I have personally reviewed and addressed the Medicare Annual Wellness questionnaire and have noted the following in the patient's  chart:  A. Medical and social history B. Use of alcohol, tobacco or illicit drugs  C. Current medications and supplements D. Functional ability and status E.  Nutritional status F.  Physical activity G. Advance directives H. List of other physicians I.  Hospitalizations, surgeries, and ER visits in previous 12 months J.  [place] such as hearing and vision if needed, cognitive and depression L. Referrals and appointments   In addition, I have reviewed and discussed with patient certain preventive protocols, quality metrics, and best practice recommendations. A written personalized care plan for preventive services as well as general preventive health recommendations were provided to patient.   Signed,  Clemetine Marker, LPN Nurse Health Advisor   Nurse Notes: none

## 2019-04-05 NOTE — Patient Instructions (Signed)
[name] , Thank you for taking time to come for your Medicare Wellness Visit. I appreciate your ongoing commitment to your health goals. Please review the following plan we discussed and let me know if I can assist you in the future.   Screening recommendations/referrals: Colonoscopy: done 01/15/19. Repeat in 2026. Recommended yearly ophthalmology/optometry visit for glaucoma screening and checkup Recommended yearly dental visit for hygiene and checkup  Vaccinations: Influenza vaccine: done 10/04/18 Pneumococcal vaccine: done 09/03/17 Tdap vaccine: due Shingles vaccine: Shingrix discussed. Please contact your pharmacy for coverage information.  Covid-19: done 03/04/19 & 03/28/19   Advanced directives: Advance directive discussed with you today. Even though you declined this today please call our office should you change your mind and we can give you the proper paperwork for you to fill out.  Conditions/risks identified: Recommend increasing physical activity   Next appointment: Please follow up in one year for your Medicare Annual Wellness visit.    Preventive Care 67 Years and Older, Male Preventive care refers to lifestyle choices and visits with your health care provider that can promote health and wellness. What does preventive care include?  A yearly physical exam. This is also called an annual well check.  Dental exams once or twice a year.  Routine eye exams. Ask your health care provider how often you should have your eyes checked.  Personal lifestyle choices, including:  Daily care of your teeth and gums.  Regular physical activity.  Eating a healthy diet.  Avoiding tobacco and drug use.  Limiting alcohol use.  Practicing safe sex.  Taking low doses of aspirin every day.  Taking vitamin and mineral supplements as recommended by your health care provider. What happens during an annual well check? The services and screenings done by your health care provider  during your annual well check will depend on your age, overall health, lifestyle risk factors, and family history of disease. Counseling  Your health care provider may ask you questions about your:  Alcohol use.  Tobacco use.  Drug use.  Emotional well-being.  Home and relationship well-being.  Sexual activity.  Eating habits.  History of falls.  Memory and ability to understand (cognition).  Work and work Statistician. Screening  You may have the following tests or measurements:  Height, weight, and BMI.  Blood pressure.  Lipid and cholesterol levels. These may be checked every 5 years, or more frequently if you are over 18 years old.  Skin check.  Lung cancer screening. You may have this screening every year starting at age 67 if you have a 30-pack-year history of smoking and currently smoke or have quit within the past 15 years.  Fecal occult blood test (FOBT) of the stool. You may have this test every year starting at age 67.  Flexible sigmoidoscopy or colonoscopy. You may have a sigmoidoscopy every 5 years or a colonoscopy every 10 years starting at age 27.  Prostate cancer screening. Recommendations will vary depending on your family history and other risks.  Hepatitis C blood test.  Hepatitis B blood test.  Sexually transmitted disease (STD) testing.  Diabetes screening. This is done by checking your blood sugar (glucose) after you have not eaten for a while (fasting). You may have this done every 1-3 years.  Abdominal aortic aneurysm (AAA) screening. You may need this if you are a current or former smoker.  Osteoporosis. You may be screened starting at age 39 if you are at high risk. Talk with your health care provider about  your test results, treatment options, and if necessary, the need for more tests. Vaccines  Your health care provider may recommend certain vaccines, such as:  Influenza vaccine. This is recommended every year.  Tetanus,  diphtheria, and acellular pertussis (Tdap, Td) vaccine. You may need a Td booster every 10 years.  Zoster vaccine. You may need this after age 67.  Pneumococcal 13-valent conjugate (PCV13) vaccine. One dose is recommended after age 67.  Pneumococcal polysaccharide (PPSV23) vaccine. One dose is recommended after age 67. Talk to your health care provider about which screenings and vaccines you need and how often you need them. This information is not intended to replace advice given to you by your health care provider. Make sure you discuss any questions you have with your health care provider. Document Released: 01/17/2015 Document Revised: 09/10/2015 Document Reviewed: 10/22/2014 Elsevier Interactive Patient Education  2017 [place] Prevention in the Home Falls can cause injuries. They can happen to people of all ages. There are many things you can do to make your home safe and to help prevent falls. What can I do on the outside of my home?  Regularly fix the edges of walkways and driveways and fix any cracks.  Remove anything that might make you trip as you walk through a door, such as a raised step or threshold.  Trim any bushes or trees on the path to your home.  Use bright outdoor lighting.  Clear any walking paths of anything that might make someone trip, such as rocks or tools.  Regularly check to see if handrails are loose or broken. Make sure that both sides of any steps have handrails.  Any raised decks and porches should have guardrails on the edges.  Have any leaves, snow, or ice cleared regularly.  Use sand or salt on walking paths during winter.  Clean up any spills in your garage right away. This includes oil or grease spills. What can I do in the bathroom?  Use night lights.  Install grab bars by the toilet and in the tub and shower. Do not use towel bars as grab bars.  Use non-skid mats or decals in the tub or shower.  If you need to sit down in  the shower, use a plastic, non-slip stool.  Keep the floor dry. Clean up any water that spills on the floor as soon as it happens.  Remove soap buildup in the tub or shower regularly.  Attach bath mats securely with double-sided non-slip rug tape.  Do not have throw rugs and other things on the floor that can make you trip. What can I do in the bedroom?  Use night lights.  Make sure that you have a light by your bed that is easy to reach.  Do not use any sheets or blankets that are too big for your bed. They should not hang down onto the floor.  Have a firm chair that has side arms. You can use this for support while you get dressed.  Do not have throw rugs and other things on the floor that can make you trip. What can I do in the kitchen?  Clean up any spills right away.  Avoid walking on wet floors.  Keep items that you use a lot in easy-to-reach places.  If you need to reach something above you, use a strong step stool that has a grab bar.  Keep electrical cords out of the way.  Do not use floor polish or wax  that makes floors slippery. If you must use wax, use non-skid floor wax.  Do not have throw rugs and other things on the floor that can make you trip. What can I do with my stairs?  Do not leave any items on the stairs.  Make sure that there are handrails on both sides of the stairs and use them. Fix handrails that are broken or loose. Make sure that handrails are as long as the stairways.  Check any carpeting to make sure that it is firmly attached to the stairs. Fix any carpet that is loose or worn.  Avoid having throw rugs at the top or bottom of the stairs. If you do have throw rugs, attach them to the floor with carpet tape.  Make sure that you have a light switch at the top of the stairs and the bottom of the stairs. If you do not have them, ask someone to add them for you. What else can I do to help prevent falls?  Wear shoes that:  Do not have high  heels.  Have rubber bottoms.  Are comfortable and fit you well.  Are closed at the toe. Do not wear sandals.  If you use a stepladder:  Make sure that it is fully opened. Do not climb a closed stepladder.  Make sure that both sides of the stepladder are locked into place.  Ask someone to hold it for you, if possible.  Clearly mark and make sure that you can see:  Any grab bars or handrails.  First and last steps.  Where the edge of each step is.  Use tools that help you move around (mobility aids) if they are needed. These include:  Canes.  Walkers.  Scooters.  Crutches.  Turn on the lights when you go into a dark area. Replace any light bulbs as soon as they burn out.  Set up your furniture so you have a clear path. Avoid moving your furniture around.  If any of your floors are uneven, fix them.  If there are any pets around you, be aware of where they are.  Review your medicines with your doctor. Some medicines can make you feel dizzy. This can increase your chance of falling. Ask your doctor what other things that you can do to help prevent falls. This information is not intended to replace advice given to you by your health care provider. Make sure you discuss any questions you have with your health care provider. Document Released: 10/17/2008 Document Revised: 05/29/2015 Document Reviewed: 01/25/2014 Elsevier Interactive Patient Education  2017 [employer].

## 2019-04-12 ENCOUNTER — Telehealth: Payer: Self-pay | Admitting: Family Medicine

## 2019-04-12 NOTE — Telephone Encounter (Signed)
   SF 04/12/2019   Name: Cameron Glover   MRN: [id number]   DOB: 15-Nov-1950   AGE: 69 y.o.   GENDER: male   PCP [name], PA-C.   Called pt regarding [employer] Referral for food insecurity. Patient stated that he needs assistance completing food stamp (FNS) application. Assisted patient with completing application. Informed patient that Care Guide will send a letter along with a copy of the FNS application for him to sign and date. Asked patient if he would like any additional resources for food. Patient stated no additional needs at this time.  Closing referral pending any other needs of patient.     [place], Care Management Phone: [phone] Email: [email or web address]

## 2019-04-16 ENCOUNTER — Encounter: Payer: Self-pay | Admitting: Family Medicine

## 2019-05-01 ENCOUNTER — Encounter (INDEPENDENT_AMBULATORY_CARE_PROVIDER_SITE_OTHER): Payer: Medicare Other | Admitting: Ophthalmology

## 2019-05-01 ENCOUNTER — Other Ambulatory Visit: Payer: Self-pay

## 2019-05-07 ENCOUNTER — Encounter (INDEPENDENT_AMBULATORY_CARE_PROVIDER_SITE_OTHER): Payer: Self-pay

## 2019-05-07 ENCOUNTER — Encounter (INDEPENDENT_AMBULATORY_CARE_PROVIDER_SITE_OTHER): Payer: Medicare Other | Admitting: Ophthalmology

## 2019-05-10 ENCOUNTER — Encounter (INDEPENDENT_AMBULATORY_CARE_PROVIDER_SITE_OTHER): Payer: Medicare Other | Admitting: Ophthalmology

## 2019-05-14 ENCOUNTER — Encounter (INDEPENDENT_AMBULATORY_CARE_PROVIDER_SITE_OTHER): Payer: Self-pay | Admitting: Ophthalmology

## 2019-05-14 ENCOUNTER — Ambulatory Visit (INDEPENDENT_AMBULATORY_CARE_PROVIDER_SITE_OTHER): Payer: Medicare Other | Admitting: Ophthalmology

## 2019-05-14 ENCOUNTER — Other Ambulatory Visit: Payer: Self-pay

## 2019-05-14 DIAGNOSIS — H2512 Age-related nuclear cataract, left eye: Secondary | ICD-10-CM

## 2019-05-14 DIAGNOSIS — H3562 Retinal hemorrhage, left eye: Secondary | ICD-10-CM | POA: Diagnosis not present

## 2019-05-14 DIAGNOSIS — E113411 Type 2 diabetes mellitus with severe nonproliferative diabetic retinopathy with macular edema, right eye: Secondary | ICD-10-CM | POA: Diagnosis not present

## 2019-05-14 DIAGNOSIS — H3561 Retinal hemorrhage, right eye: Secondary | ICD-10-CM

## 2019-05-14 DIAGNOSIS — E113412 Type 2 diabetes mellitus with severe nonproliferative diabetic retinopathy with macular edema, left eye: Secondary | ICD-10-CM | POA: Diagnosis not present

## 2019-05-14 DIAGNOSIS — H2511 Age-related nuclear cataract, right eye: Secondary | ICD-10-CM

## 2019-05-14 HISTORY — DX: Age-related nuclear cataract, left eye: H25.12

## 2019-05-14 MED ORDER — BEVACIZUMAB CHEMO INJECTION 1.25MG/0.05ML SYRINGE FOR KALEIDOSCOPE
1.2500 mg | INTRAVITREAL | Status: AC | PRN
  Administered 2019-05-14: 1.25 mg via INTRAVITREAL

## 2019-05-14 NOTE — Assessment & Plan Note (Signed)

## 2019-05-14 NOTE — Assessment & Plan Note (Addendum)
The nature of diabetic macular edema was discussed with the patient. Treatment options were outlined including medical therapy, laser & vitrectomy. The use of injectable medications reviewed, including Avastin, Lucentis, and Eylea. Periodic injections into the eye are likely to resolve diabetic macular edema (swelling in the center of vision). Initially, injections are delivered are delivered every 4-6 weeks, and the interval extended as the condition improves. On average, 8-9 injections the first year, and 5 in year 2. Improvement in the condition most often improves on medical therapy. Occasional use of focal laser is also recommended for residual macular edema (swelling). Excellent control of blood glucose and blood pressure are encouraged under the care of a primary physician or endocrinologist. Similarly, attempts to maintain serum cholesterol, low density lipoproteins, and high-density lipoproteins in a favorable range were recommended.   OS, improved overall yet somewhat more macular edema at 10-week interval.  We will repeat intravitreal Avastin today and exam in 8 weeks

## 2019-05-14 NOTE — Progress Notes (Signed)
05/14/2019     CHIEF COMPLAINT Patient presents for Retina Follow Up   HISTORY OF PRESENT ILLNESS: Cameron Glover is a 69 y.o. male who presents to the clinic today for:   HPI    Retina Follow Up    Patient presents with  Diabetic Retinopathy.  In left eye.  This started 10 weeks ago.  Severity is mild.  Duration of 10 weeks.  Since onset it is stable.          Comments    10 Week Diabetic F/U OU, poss Avastin OS  Pt denies noticeable changes to [place] OU since last visit. Pt denies ocular pain, flashes of light, or floaters OU.  LBS: 144 this AM A1c: pt does not recall       Last edited by Rockie Neighbours, [place] on 05/14/2019  2:44 PM. (History)      Referring physician: [name], PA-C [address] [place],  [place] [address]  HISTORICAL INFORMATION:   Selected notes from the MEDICAL RECORD NUMBER    Lab Results  Component Value Date   HGBA1C 6.9 (H) 02/12/2019     CURRENT MEDICATIONS: No current outpatient medications on file. (Ophthalmic Drugs)   No current facility-administered medications for this visit. (Ophthalmic Drugs)   Current Outpatient Medications (Other)  Medication Sig  . amLODipine (NORVASC) 10 MG tablet Take 1 tablet (10 mg total) by mouth daily.  Marland Kitchen atorvastatin (LIPITOR) 40 MG tablet Take 1 tablet (40 mg total) by mouth daily.  . blood glucose meter kit and supplies KIT Dispense based on patient and insurance preference. Use up to four times daily as directed. (FOR ICD-9 250.00, 250.01).  . Glucose Blood (BLOOD GLUCOSE TEST STRIPS) STRP Use as directed to monitor FSBS once daily, for ICD 10 E11.65  . lisinopril-hydrochlorothiazide (ZESTORETIC) 20-12.5 MG tablet Take 1 tablet by mouth daily.  . metFORMIN (GLUCOPHAGE) 1000 MG tablet Take 1 tablet (1,000 mg total) by mouth 2 (two) times daily with a meal.  . pantoprazole (PROTONIX) 40 MG tablet Take 1 tablet (40 mg total) by mouth daily.   No current facility-administered medications for  this visit. (Other)      REVIEW OF SYSTEMS:    ALLERGIES No Known Allergies  PAST MEDICAL HISTORY Past Medical History:  Diagnosis Date  . Acute renal failure (ARF) ([place])   . AKI (acute kidney injury) ([place]) 09/02/2017  . Anemia due to chronic kidney disease 04/15/2018  . Diabetes mellitus without complication ([place])   . Elevated lipase 04/15/2018  . Hyperglycemia without ketosis   . Hyperlipidemia   . Hypertension    Past Surgical History:  Procedure Laterality Date  . COLONOSCOPY WITH PROPOFOL N/A 01/15/2019   Procedure: COLONOSCOPY WITH PROPOFOL;  Surgeon: Jonathon Bellows, MD;  Location: [facility] ENDOSCOPY;  Service: Gastroenterology;  Laterality: N/A;    FAMILY HISTORY Family History  Problem Relation Age of Onset  . Kidney failure Sister   . Cancer Sister   . Diabetes Sister   . Hypertension Sister   . Hyperlipidemia Sister   . Heart disease Sister   . Kidney disease Sister   . Cancer Brother   . CAD Brother   . Diabetes Brother   . Hypertension Brother   . Hyperlipidemia Brother   . Heart disease Brother   . Diabetes Other   . Cancer Mother     SOCIAL HISTORY Social History   Tobacco Use  . Smoking status: Never Smoker  . Smokeless tobacco: Never Used  Substance Use Topics  . Alcohol use: Never  . Drug use: Never         OPHTHALMIC EXAM:  Base Eye Exam    Visual Acuity (ETDRS)      Right Left   Dist [place] 20/20 -1 20/20 -2       Tonometry (Tonopen, 2:48 PM)      Right Left   Pressure 11 15       Pupils      Pupils Dark Light Shape React APD   Right PERRL 4 3 Round Brisk None   Left PERRL 4 3 Round Brisk None       Visual Fields (Counting fingers)      Left Right    Full Full       Extraocular Movement      Right Left    Full Full       Neuro/Psych    Oriented x3: Yes   Mood/Affect: Normal       Dilation    Both eyes: 1.0% Mydriacyl, 2.5% Phenylephrine @ 2:48 PM        Slit Lamp and Fundus Exam    External Exam      Right  Left   External Normal Normal       Slit Lamp Exam      Right Left   Lids/Lashes Normal Normal   Conjunctiva/Sclera White and quiet White and quiet   Cornea Clear Clear   Anterior Chamber Deep and quiet Deep and quiet   Iris Round and reactive Round and reactive   Lens 3+ Nuclear sclerosis 3+ Nuclear sclerosis   Anterior Vitreous Normal Normal       Fundus Exam      Right Left   Posterior Vitreous Normal Posterior vitreous detachment   Disc Normal Normal   C/D Ratio 0.5 0.5   Macula Microaneurysms, no macular thickening Microaneurysms, , Mild clinically significant macular edema   Vessels NPDR severedr severe NPDR severedr severe   Periphery Normal Normal          IMAGING AND PROCEDURES  Imaging and Procedures for 05/14/19  OCT, Retina - OU - Both Eyes       Right Eye Quality was good. Scan locations included subfoveal. Central Foveal Thickness: 283. Progression has been stable. Findings include normal observations.   Left Eye Central Foveal Thickness: 285. Progression has worsened.   Notes Clinically significant macular edema left eye overall improved yet slightly worse at 10-week exam interval.  Repeat intravitreal Avastin OS today       Intravitreal Injection, Pharmacologic Agent - OS - Left Eye       Time Out 05/14/2019. 4:16 PM. Confirmed correct patient, procedure, site, and patient consented.   Anesthesia Topical anesthesia was used. Anesthetic medications included [place] 3.5%.   Procedure Preparation included Tobramycin 0.3%, 10% betadine to eyelids. A 30 gauge needle was used.   Injection:  1.25 mg Bevacizumab (AVASTIN) SOLN   NDC: [address]-3, Lot: [id number]   Route: Intravitreal, Site: Left Eye, Waste: 0 mg  Post-op Post injection exam found visual acuity of at least counting fingers. The patient tolerated the procedure well. There were no complications. The patient received written and verbal post procedure care education. Post injection  medications were not given.                 ASSESSMENT/PLAN:  Severe nonproliferative diabetic retinopathy of left eye, with macular edema, associated with type 2 diabetes mellitus ([place])  The nature of  diabetic macular edema was discussed with the patient. Treatment options were outlined including medical therapy, laser & vitrectomy. The use of injectable medications reviewed, including Avastin, Lucentis, and Eylea. Periodic injections into the eye are likely to resolve diabetic macular edema (swelling in the center of vision). Initially, injections are delivered are delivered every 4-6 weeks, and the interval extended as the condition improves. On average, 8-9 injections the first year, and 5 in year 2. Improvement in the condition most often improves on medical therapy. Occasional use of focal laser is also recommended for residual macular edema (swelling). Excellent control of blood glucose and blood pressure are encouraged under the care of a primary physician or endocrinologist. Similarly, attempts to maintain serum cholesterol, low density lipoproteins, and high-density lipoproteins in a favorable range were recommended.   OS, improved overall yet somewhat more macular edema at 10-week interval.  We will repeat intravitreal Avastin today and exam in 8 weeks  Nuclear sclerotic cataract of right eye The nature of cataract was discussed with the patient as well as the elective nature of surgery. The patient was reassured that surgery at a later date does not put the patient at risk for a worse outcome. It was emphasized that the need for surgery is dictated by the patient's quality of life as influenced by the cataract. Patient was instructed to maintain close follow up with their general eye care doctor.      ICD-10-CM   1. Severe nonproliferative diabetic retinopathy of left eye, with macular edema, associated with type 2 diabetes mellitus (HCC)  K27.0623 OCT, Retina - OU - Both Eyes     Intravitreal Injection, Pharmacologic Agent - OS - Left Eye    Bevacizumab (AVASTIN) SOLN 1.25 mg  2. Severe nonproliferative diabetic retinopathy of right eye, with macular edema, associated with type 2 diabetes mellitus ([place])  E11.3411   3. Retinal hemorrhage of right eye  H35.61   4. Retinal hemorrhage of left eye  H35.62   5. Nuclear sclerotic cataract of right eye  H25.11   6. Nuclear sclerotic cataract of left eye  H25.12     1. OS, improved overall yet somewhat more macular edema at 10-week interval.  We will repeat intravitreal Avastin today and exam in 8 weeks  2.  3.  Ophthalmic Meds Ordered this visit:  Meds ordered this encounter  Medications  . Bevacizumab (AVASTIN) SOLN 1.25 mg       Return in about 8 weeks (around 07/09/2019) for OS, AVASTIN OCT.  There are no Patient Instructions on file for this visit.   Explained the diagnoses, plan, and follow up with the patient and they expressed understanding.  Patient expressed understanding of the importance of proper follow up care.   Clent Demark Dejour Vos M.D. Diseases & Surgery of the Retina and Vitreous Retina & Diabetic [place] 05/14/19     Abbreviations: M myopia (nearsighted); A astigmatism; H hyperopia (farsighted); P presbyopia; Mrx spectacle prescription;  CTL contact lenses; OD right eye; OS left eye; OU both eyes  XT exotropia; ET esotropia; PEK punctate epithelial keratitis; PEE punctate epithelial erosions; DES dry eye syndrome; MGD meibomian gland dysfunction; ATs artificial tears; PFAT's preservative free artificial tears; [place] nuclear sclerotic cataract; PSC posterior subcapsular cataract; ERM epi-retinal membrane; PVD posterior vitreous detachment; RD retinal detachment; DM diabetes mellitus; DR diabetic retinopathy; NPDR non-proliferative diabetic retinopathy; PDR proliferative diabetic retinopathy; CSME clinically significant macular edema; DME diabetic macular edema; dbh dot blot hemorrhages; CWS cotton  wool spot; POAG primary  open angle glaucoma; C/D cup-to-disc ratio; HVF humphrey visual field; GVF goldmann visual field; OCT optical coherence tomography; IOP intraocular pressure; BRVO Branch retinal vein occlusion; CRVO central retinal vein occlusion; CRAO central retinal artery occlusion; BRAO branch retinal artery occlusion; RT retinal tear; SB scleral buckle; PPV pars plana vitrectomy; VH Vitreous hemorrhage; PRP panretinal laser photocoagulation; IVK intravitreal kenalog; VMT vitreomacular traction; MH Macular hole;  NVD neovascularization of the disc; NVE neovascularization elsewhere; AREDS age related eye disease study; ARMD age related macular degeneration; POAG primary open angle glaucoma; EBMD epithelial/anterior basement membrane dystrophy; ACIOL anterior chamber intraocular lens; IOL intraocular lens; PCIOL posterior chamber intraocular lens; Phaco/IOL phacoemulsification with intraocular lens placement; [place] photorefractive keratectomy; LASIK laser assisted in situ keratomileusis; HTN hypertension; DM diabetes mellitus; COPD chronic obstructive pulmonary disease

## 2019-06-28 ENCOUNTER — Other Ambulatory Visit: Payer: Self-pay | Admitting: Family Medicine

## 2019-06-28 DIAGNOSIS — R1013 Epigastric pain: Secondary | ICD-10-CM

## 2019-07-12 ENCOUNTER — Encounter (INDEPENDENT_AMBULATORY_CARE_PROVIDER_SITE_OTHER): Payer: Medicare Other | Admitting: Ophthalmology

## 2019-07-17 ENCOUNTER — Encounter (INDEPENDENT_AMBULATORY_CARE_PROVIDER_SITE_OTHER): Payer: Self-pay | Admitting: Ophthalmology

## 2019-07-17 ENCOUNTER — Other Ambulatory Visit: Payer: Self-pay

## 2019-07-17 ENCOUNTER — Ambulatory Visit (INDEPENDENT_AMBULATORY_CARE_PROVIDER_SITE_OTHER): Payer: Medicare Other | Admitting: Ophthalmology

## 2019-07-17 DIAGNOSIS — E113412 Type 2 diabetes mellitus with severe nonproliferative diabetic retinopathy with macular edema, left eye: Secondary | ICD-10-CM | POA: Diagnosis not present

## 2019-07-17 MED ORDER — BEVACIZUMAB CHEMO INJECTION 1.25MG/0.05ML SYRINGE FOR KALEIDOSCOPE
1.2500 mg | INTRAVITREAL | Status: AC | PRN
  Administered 2019-07-17: 1.25 mg via INTRAVITREAL

## 2019-07-17 NOTE — Assessment & Plan Note (Signed)
Overall CSME OS is much less active, still with focal areas of thickening temporal to fovea, will repeat intravitreal Avastin today and follow-up in 2 weeks for focal to this treated area to diminish treatment burden

## 2019-07-17 NOTE — Progress Notes (Signed)
07/17/2019     CHIEF COMPLAINT Patient presents for Retina Follow Up   HISTORY OF PRESENT ILLNESS: Cameron Glover is a 69 y.o. male who presents to the clinic today for:   HPI    Retina Follow Up    Patient presents with  Diabetic Retinopathy.  In left eye.  Duration of 9 weeks.  Since onset it is stable.          Comments    9 week follow up - OCT OU, Poss Avastin OS Patient denies change in vision and overall has no complaints.        Last edited by Gerda Diss on 07/17/2019  2:24 PM. (History)      Referring physician: [name], PA-C [address] [place],  [place] [address]  HISTORICAL INFORMATION:   Selected notes from the MEDICAL RECORD NUMBER    Lab Results  Component Value Date   HGBA1C 6.9 (H) 02/12/2019     CURRENT MEDICATIONS: No current outpatient medications on file. (Ophthalmic Drugs)   No current facility-administered medications for this visit. (Ophthalmic Drugs)   Current Outpatient Medications (Other)  Medication Sig  . amLODipine (NORVASC) 10 MG tablet Take 1 tablet (10 mg total) by mouth daily.  Marland Kitchen atorvastatin (LIPITOR) 40 MG tablet Take 1 tablet (40 mg total) by mouth daily.  . blood glucose meter kit and supplies KIT Dispense based on patient and insurance preference. Use up to four times daily as directed. (FOR ICD-9 250.00, 250.01).  . Glucose Blood (BLOOD GLUCOSE TEST STRIPS) STRP Use as directed to monitor FSBS once daily, for ICD 10 E11.65  . lisinopril-hydrochlorothiazide (ZESTORETIC) 20-12.5 MG tablet Take 1 tablet by mouth daily.  . metFORMIN (GLUCOPHAGE) 1000 MG tablet Take 1 tablet (1,000 mg total) by mouth 2 (two) times daily with a meal.  . pantoprazole (PROTONIX) 40 MG tablet TAKE 1 TABLET BY MOUTH EVERY DAY   No current facility-administered medications for this visit. (Other)      REVIEW OF SYSTEMS:    ALLERGIES No Known Allergies  PAST MEDICAL HISTORY Past Medical History:  Diagnosis Date  .  Acute renal failure (ARF) ([place])   . AKI (acute kidney injury) ([place]) 09/02/2017  . Anemia due to chronic kidney disease 04/15/2018  . Diabetes mellitus without complication ([place])   . Elevated lipase 04/15/2018  . Hyperglycemia without ketosis   . Hyperlipidemia   . Hypertension    Past Surgical History:  Procedure Laterality Date  . COLONOSCOPY WITH PROPOFOL N/A 01/15/2019   Procedure: COLONOSCOPY WITH PROPOFOL;  Surgeon: Jonathon Bellows, MD;  Location: [facility] ENDOSCOPY;  Service: Gastroenterology;  Laterality: N/A;    FAMILY HISTORY Family History  Problem Relation Age of Onset  . Kidney failure Sister   . Cancer Sister   . Diabetes Sister   . Hypertension Sister   . Hyperlipidemia Sister   . Heart disease Sister   . Kidney disease Sister   . Cancer Brother   . CAD Brother   . Diabetes Brother   . Hypertension Brother   . Hyperlipidemia Brother   . Heart disease Brother   . Diabetes Other   . Cancer Mother     SOCIAL HISTORY Social History   Tobacco Use  . Smoking status: Never Smoker  . Smokeless tobacco: Never Used  Vaping Use  . Vaping Use: Never used  Substance Use Topics  . Alcohol use: Never  . Drug use: Never  OPHTHALMIC EXAM:  Base Eye Exam    Visual Acuity (Snellen - Linear)      Right Left   Dist [place] 20/20-2 20/20-1       Tonometry (Tonopen, 2:29 PM)      Right Left   Pressure 10 11       Pupils      Pupils Dark Light Shape React APD   Right PERRL 4 3 Round Brisk None   Left PERRL 4 3 Round Brisk None       Visual Fields (Counting fingers)      Left Right    Full Full       Extraocular Movement      Right Left    Full Full       Neuro/Psych    Oriented x3: Yes   Mood/Affect: Normal       Dilation    Left eye: 1.0% Mydriacyl, 2.5% Phenylephrine @ 2:29 PM        Slit Lamp and Fundus Exam    External Exam      Right Left   External Normal Normal       Slit Lamp Exam      Right Left   Lids/Lashes Normal Normal    Conjunctiva/Sclera White and quiet White and quiet   Cornea Clear Clear   Anterior Chamber Deep and quiet Deep and quiet   Iris Round and reactive Round and reactive   Lens 3+ Nuclear sclerosis 2+ Nuclear sclerosis   Anterior Vitreous Normal Normal       Fundus Exam      Right Left   Posterior Vitreous  Posterior vitreous detachment   Disc  Normal   C/D Ratio  0.5   Macula  Microaneurysms, , Mild clinically significant macular edema   Vessels  NPDR  severe   Periphery  Normal          IMAGING AND PROCEDURES  Imaging and Procedures for 07/17/19  OCT, Retina - OU - Both Eyes       Right Eye Quality was good. Scan locations included subfoveal. Central Foveal Thickness: 287. Progression has been stable.   Left Eye Quality was good. Scan locations included subfoveal. Central Foveal Thickness: 284. Progression has improved. Findings include cystoid macular edema.   Notes CSME OS temporally, is improved and stable at 9 weeks Avastin OS today, and return visit in 2 weeks for focal treatment to diminish treatment burden       Intravitreal Injection, Pharmacologic Agent - OS - Left Eye       Time Out 07/17/2019. 3:00 PM. Confirmed correct patient, procedure, site, and patient consented.   Anesthesia Topical anesthesia was used. Anesthetic medications included Akten 3.5%.   Procedure Preparation included 10% betadine to eyelids, 5% betadine to ocular surface, Ofloxacin . A 30 gauge needle was used.   Injection:  1.25 mg Bevacizumab (AVASTIN) SOLN   NDC: [address]-1, Lot: [id number]   Route: Intravitreal, Site: Left Eye, Waste: 0 mg  Post-op Post injection exam found visual acuity of at least counting fingers. The patient tolerated the procedure well. There were no complications. The patient received written and verbal post procedure care education. Post injection medications were not given.                 ASSESSMENT/PLAN:  Severe nonproliferative diabetic  retinopathy of left eye, with macular edema, associated with type 2 diabetes mellitus (HCC) Overall CSME OS is much less active, still with focal areas of  thickening temporal to fovea, will repeat intravitreal Avastin today and follow-up in 2 weeks for focal to this treated area to diminish treatment burden      ICD-10-CM   1. Severe nonproliferative diabetic retinopathy of left eye, with macular edema, associated with type 2 diabetes mellitus (HCC)  Y19.5093 OCT, Retina - OU - Both Eyes    Intravitreal Injection, Pharmacologic Agent - OS - Left Eye    Bevacizumab (AVASTIN) SOLN 1.25 mg    1.  2.  3.  Ophthalmic Meds Ordered this visit:  Meds ordered this encounter  Medications  . Bevacizumab (AVASTIN) SOLN 1.25 mg       Return in about 2 weeks (around 07/31/2019) for dilate, OS, FOCAL.  There are no Patient Instructions on file for this visit.   Explained the diagnoses, plan, and follow up with the patient and they expressed understanding.  Patient expressed understanding of the importance of proper follow up care.   Clent Demark Dezi Brauner M.D. Diseases & Surgery of the Retina and Vitreous Retina & Diabetic [place] 07/17/19     Abbreviations: M myopia (nearsighted); A astigmatism; H hyperopia (farsighted); P presbyopia; Mrx spectacle prescription;  CTL contact lenses; OD right eye; OS left eye; OU both eyes  XT exotropia; ET esotropia; PEK punctate epithelial keratitis; PEE punctate epithelial erosions; DES dry eye syndrome; MGD meibomian gland dysfunction; ATs artificial tears; PFAT's preservative free artificial tears; [place] nuclear sclerotic cataract; PSC posterior subcapsular cataract; ERM epi-retinal membrane; PVD posterior vitreous detachment; RD retinal detachment; DM diabetes mellitus; DR diabetic retinopathy; NPDR non-proliferative diabetic retinopathy; PDR proliferative diabetic retinopathy; CSME clinically significant macular edema; DME diabetic macular edema; dbh dot blot  hemorrhages; CWS cotton wool spot; POAG primary open angle glaucoma; C/D cup-to-disc ratio; HVF humphrey visual field; GVF goldmann visual field; OCT optical coherence tomography; IOP intraocular pressure; BRVO Branch retinal vein occlusion; CRVO central retinal vein occlusion; CRAO central retinal artery occlusion; BRAO branch retinal artery occlusion; RT retinal tear; SB scleral buckle; PPV pars plana vitrectomy; VH Vitreous hemorrhage; PRP panretinal laser photocoagulation; IVK intravitreal kenalog; VMT vitreomacular traction; MH Macular hole;  NVD neovascularization of the disc; NVE neovascularization elsewhere; AREDS age related eye disease study; ARMD age related macular degeneration; POAG primary open angle glaucoma; EBMD epithelial/anterior basement membrane dystrophy; ACIOL anterior chamber intraocular lens; IOL intraocular lens; PCIOL posterior chamber intraocular lens; Phaco/IOL phacoemulsification with intraocular lens placement; PRK photorefractive keratectomy; LASIK laser assisted in situ keratomileusis; HTN hypertension; DM diabetes mellitus; COPD chronic obstructive pulmonary disease   07/17/2019     CHIEF COMPLAINT Patient presents for Retina Follow Up   HISTORY OF PRESENT ILLNESS: Cameron Glover is a 69 y.o. male who presents to the clinic today for:   HPI    Retina Follow Up    Patient presents with  Diabetic Retinopathy.  In left eye.  Duration of 9 weeks.  Since onset it is stable.          Comments    9 week follow up - OCT OU, Poss Avastin OS Patient denies change in vision and overall has no complaints.        Last edited by Gerda Diss on 07/17/2019  2:24 PM. (History)      Referring physician: [name], PA-C [address] [place],  [place] [address]  HISTORICAL INFORMATION:   Selected notes from the MEDICAL RECORD NUMBER    Lab Results  Component Value Date   HGBA1C 6.9 (H) 02/12/2019     CURRENT  MEDICATIONS: No current outpatient  medications on file. (Ophthalmic Drugs)   No current facility-administered medications for this visit. (Ophthalmic Drugs)   Current Outpatient Medications (Other)  Medication Sig  . amLODipine (NORVASC) 10 MG tablet Take 1 tablet (10 mg total) by mouth daily.  Marland Kitchen atorvastatin (LIPITOR) 40 MG tablet Take 1 tablet (40 mg total) by mouth daily.  . blood glucose meter kit and supplies KIT Dispense based on patient and insurance preference. Use up to four times daily as directed. (FOR ICD-9 250.00, 250.01).  . Glucose Blood (BLOOD GLUCOSE TEST STRIPS) STRP Use as directed to monitor FSBS once daily, for ICD 10 E11.65  . lisinopril-hydrochlorothiazide (ZESTORETIC) 20-12.5 MG tablet Take 1 tablet by mouth daily.  . metFORMIN (GLUCOPHAGE) 1000 MG tablet Take 1 tablet (1,000 mg total) by mouth 2 (two) times daily with a meal.  . pantoprazole (PROTONIX) 40 MG tablet TAKE 1 TABLET BY MOUTH EVERY DAY   No current facility-administered medications for this visit. (Other)      REVIEW OF SYSTEMS:    ALLERGIES No Known Allergies  PAST MEDICAL HISTORY Past Medical History:  Diagnosis Date  . Acute renal failure (ARF) ([place])   . AKI (acute kidney injury) ([place]) 09/02/2017  . Anemia due to chronic kidney disease 04/15/2018  . Diabetes mellitus without complication ([place])   . Elevated lipase 04/15/2018  . Hyperglycemia without ketosis   . Hyperlipidemia   . Hypertension    Past Surgical History:  Procedure Laterality Date  . COLONOSCOPY WITH PROPOFOL N/A 01/15/2019   Procedure: COLONOSCOPY WITH PROPOFOL;  Surgeon: Jonathon Bellows, MD;  Location[facility] ENDOSCOPY;  Service: Gastroenterology;  Laterality: N/A;    FAMILY HISTORY Family History  Problem Relation Age of Onset  . Kidney failure Sister   . Cancer Sister   . Diabetes Sister   . Hypertension Sister   . Hyperlipidemia Sister   . Heart disease Sister   . Kidney disease Sister   . Cancer Brother   . CAD Brother   . Diabetes Brother   .  Hypertension Brother   . Hyperlipidemia Brother   . Heart disease Brother   . Diabetes Other   . Cancer Mother     SOCIAL HISTORY Social History   Tobacco Use  . Smoking status: Never Smoker  . Smokeless tobacco: Never Used  Vaping Use  . Vaping Use: Never used  Substance Use Topics  . Alcohol use: Never  . Drug use: Never         OPHTHALMIC EXAM:  Base Eye Exam    Visual Acuity (Snellen - Linear)      Right Left   Dist [place] 20/20-2 20/20-1       Tonometry (Tonopen, 2:29 PM)      Right Left   Pressure 10 11       Pupils      Pupils Dark Light Shape React APD   Right PERRL 4 3 Round Brisk None   Left PERRL 4 3 Round Brisk None       Visual Fields (Counting fingers)      Left Right    Full Full       Extraocular Movement      Right Left    Full Full       Neuro/Psych    Oriented x3: Yes   Mood/Affect: Normal       Dilation    Left eye: 1.0% Mydriacyl, 2.5% Phenylephrine @ 2:29 PM  Slit Lamp and Fundus Exam    External Exam      Right Left   External Normal Normal       Slit Lamp Exam      Right Left   Lids/Lashes Normal Normal   Conjunctiva/Sclera White and quiet White and quiet   Cornea Clear Clear   Anterior Chamber Deep and quiet Deep and quiet   Iris Round and reactive Round and reactive   Lens 3+ Nuclear sclerosis 2+ Nuclear sclerosis   Anterior Vitreous Normal Normal       Fundus Exam      Right Left   Posterior Vitreous  Posterior vitreous detachment   Disc  Normal   C/D Ratio  0.5   Macula  Microaneurysms, , Mild clinically significant macular edema   Vessels  NPDR  severe   Periphery  Normal          IMAGING AND PROCEDURES  Imaging and Procedures for 07/17/19  OCT, Retina - OU - Both Eyes       Right Eye Quality was good. Scan locations included subfoveal. Central Foveal Thickness: 287. Progression has been stable.   Left Eye Quality was good. Scan locations included subfoveal. Central Foveal Thickness:  284. Progression has improved. Findings include cystoid macular edema.   Notes CSME OS temporally, is improved and stable at 9 weeks Avastin OS today, and return visit in 2 weeks for focal treatment to diminish treatment burden       Intravitreal Injection, Pharmacologic Agent - OS - Left Eye       Time Out 07/17/2019. 3:00 PM. Confirmed correct patient, procedure, site, and patient consented.   Anesthesia Topical anesthesia was used. Anesthetic medications included Akten 3.5%.   Procedure Preparation included 10% betadine to eyelids, 5% betadine to ocular surface, Ofloxacin . A 30 gauge needle was used.   Injection:  1.25 mg Bevacizumab (AVASTIN) SOLN   NDC: [address]-7, Lot: [id number]   Route: Intravitreal, Site: Left Eye, Waste: 0 mg  Post-op Post injection exam found visual acuity of at least counting fingers. The patient tolerated the procedure well. There were no complications. The patient received written and verbal post procedure care education. Post injection medications were not given.                 ASSESSMENT/PLAN:  Severe nonproliferative diabetic retinopathy of left eye, with macular edema, associated with type 2 diabetes mellitus (HCC) Overall CSME OS is much less active, still with focal areas of thickening temporal to fovea, will repeat intravitreal Avastin today and follow-up in 2 weeks for focal to this treated area to diminish treatment burden      ICD-10-CM   1. Severe nonproliferative diabetic retinopathy of left eye, with macular edema, associated with type 2 diabetes mellitus (HCC)  J94.1740 OCT, Retina - OU - Both Eyes    Intravitreal Injection, Pharmacologic Agent - OS - Left Eye    Bevacizumab (AVASTIN) SOLN 1.25 mg    1.  2.  3.  Ophthalmic Meds Ordered this visit:  Meds ordered this encounter  Medications  . Bevacizumab (AVASTIN) SOLN 1.25 mg       Return in about 2 weeks (around 07/31/2019) for dilate, OS, FOCAL.  There are no  Patient Instructions on file for this visit.   Explained the diagnoses, plan, and follow up with the patient and they expressed understanding.  Patient expressed understanding of the importance of proper follow up care.   Clent Demark Adaleena Mooers M.D. Diseases &  Surgery of the Retina and Vitreous Retina & Diabetic [place] 07/17/19     Abbreviations: M myopia (nearsighted); A astigmatism; H hyperopia (farsighted); P presbyopia; Mrx spectacle prescription;  CTL contact lenses; OD right eye; OS left eye; OU both eyes  XT exotropia; ET esotropia; PEK punctate epithelial keratitis; PEE punctate epithelial erosions; DES dry eye syndrome; MGD meibomian gland dysfunction; ATs artificial tears; PFAT's preservative free artificial tears; [place] nuclear sclerotic cataract; PSC posterior subcapsular cataract; ERM epi-retinal membrane; PVD posterior vitreous detachment; RD retinal detachment; DM diabetes mellitus; DR diabetic retinopathy; NPDR non-proliferative diabetic retinopathy; PDR proliferative diabetic retinopathy; CSME clinically significant macular edema; DME diabetic macular edema; dbh dot blot hemorrhages; CWS cotton wool spot; POAG primary open angle glaucoma; C/D cup-to-disc ratio; HVF humphrey visual field; GVF goldmann visual field; OCT optical coherence tomography; IOP intraocular pressure; BRVO Branch retinal vein occlusion; CRVO central retinal vein occlusion; CRAO central retinal artery occlusion; BRAO branch retinal artery occlusion; RT retinal tear; SB scleral buckle; PPV pars plana vitrectomy; VH Vitreous hemorrhage; PRP panretinal laser photocoagulation; IVK intravitreal kenalog; VMT vitreomacular traction; MH Macular hole;  NVD neovascularization of the disc; NVE neovascularization elsewhere; AREDS age related eye disease study; ARMD age related macular degeneration; POAG primary open angle glaucoma; EBMD epithelial/anterior basement membrane dystrophy; ACIOL anterior chamber intraocular lens; IOL  intraocular lens; PCIOL posterior chamber intraocular lens; Phaco/IOL phacoemulsification with intraocular lens placement; [place] photorefractive keratectomy; LASIK laser assisted in situ keratomileusis; HTN hypertension; DM diabetes mellitus; COPD chronic obstructive pulmonary disease

## 2019-07-23 ENCOUNTER — Encounter: Payer: Self-pay | Admitting: Family Medicine

## 2019-07-23 ENCOUNTER — Other Ambulatory Visit: Payer: Self-pay

## 2019-07-23 ENCOUNTER — Ambulatory Visit (INDEPENDENT_AMBULATORY_CARE_PROVIDER_SITE_OTHER): Payer: Medicare Other | Admitting: Family Medicine

## 2019-07-23 VITALS — BP 124/78 | HR 98 | Temp 97.1°F | Resp 16 | Ht 64.0 in | Wt 176.8 lb

## 2019-07-23 DIAGNOSIS — I10 Essential (primary) hypertension: Secondary | ICD-10-CM

## 2019-07-23 DIAGNOSIS — N182 Chronic kidney disease, stage 2 (mild): Secondary | ICD-10-CM

## 2019-07-23 DIAGNOSIS — E1169 Type 2 diabetes mellitus with other specified complication: Secondary | ICD-10-CM

## 2019-07-23 DIAGNOSIS — Z23 Encounter for immunization: Secondary | ICD-10-CM | POA: Diagnosis not present

## 2019-07-23 DIAGNOSIS — E1129 Type 2 diabetes mellitus with other diabetic kidney complication: Secondary | ICD-10-CM

## 2019-07-23 DIAGNOSIS — E1122 Type 2 diabetes mellitus with diabetic chronic kidney disease: Secondary | ICD-10-CM | POA: Diagnosis not present

## 2019-07-23 DIAGNOSIS — Z5181 Encounter for therapeutic drug level monitoring: Secondary | ICD-10-CM

## 2019-07-23 DIAGNOSIS — I517 Cardiomegaly: Secondary | ICD-10-CM

## 2019-07-23 DIAGNOSIS — E785 Hyperlipidemia, unspecified: Secondary | ICD-10-CM

## 2019-07-23 DIAGNOSIS — R809 Proteinuria, unspecified: Secondary | ICD-10-CM

## 2019-07-23 NOTE — Progress Notes (Signed)
Name: Cameron Glover   MRN: [id number]    DOB: 06/09/1950   Date:07/23/2019       Progress Note  Chief Complaint  Patient presents with  . Follow-up  . Hypertension  . Diabetes  . Hyperlipidemia     Subjective:   Cameron Glover is a 69 y.o. male, presents to clinic for routine f/up on multiple chronic conditions.  DM:    Currently managing with metformin 1000 mg bid Blood sugars 108-140 Denies: Polyuria, polydipsia, vision changes, neuropathy, hypoglycemia Recent pertinent labs: Lab Results  Component Value Date   HGBA1C 6.9 (H) 02/12/2019   HGBA1C 7.7 (A) 09/22/2018   HGBA1C 8.5 (H) 04/12/2018   Standard of care and health maintenance: Urine Microalbumin:  Done -  Foot exam:  Done today DM eye exam:  Seeing ophtho ACEI/ARB:  lisinopril Statin:  lipitor   Significant ophthalmic complications- recent visits/dx reviewed  He asks for refill on glipizide, but he has not been on this in over 2 years  Hypertension:  Currently managed on norvasc and lisinopril-HCTZ Pt reports good med compliance and denies any SE.  No lightheadedness, hypotension, syncope. Blood pressure today is well controlled. BP Readings from Last 3 Encounters:  07/23/19 124/78  03/23/19 138/82  02/15/19 (!) 148/80   Pt denies CP, SOB, exertional sx, LE edema, palpitation, Ha's, visual disturbances Dietary efforts for BP?  Healthier, low salt  Hyperlipidemia: Currently treated with lipitor 40 mg, pt reports good med compliance Last Lipids: Lab Results  Component Value Date   CHOL 89 02/12/2019   HDL 34 (L) 02/12/2019   LDLCALC 38 02/12/2019   TRIG 86 02/12/2019   CHOLHDL 2.6 02/12/2019   - Denies: Chest pain, shortness of breath, myalgias, claudication  Renal function recent labs:  Lab Results  Component Value Date   GFRAA 72 07/23/2019   GFRAA 74 03/23/2019   GFRAA 71 02/12/2019    Lab Results  Component Value Date   CREATININE 1.21 07/23/2019   BUN 24 07/23/2019   NA 141  07/23/2019   K 4.2 07/23/2019   CL 108 07/23/2019   CO2 26 07/23/2019   Significant family hx of kidney disease, pt has consulted with nephro- seeing annually Lab Results  Component Value Date   MICROALBUR 100 02/13/2019   MICROALBUR 108.3 04/12/2018    Cardiomegaly - pt denies palpitations, orthopnea, LE edema He notes he has a f/up soon with cardiology      Current Outpatient Medications:  .  amLODipine (NORVASC) 10 MG tablet, Take 1 tablet (10 mg total) by mouth daily., Disp: 90 tablet, Rfl: 3 .  atorvastatin (LIPITOR) 40 MG tablet, Take 1 tablet (40 mg total) by mouth daily., Disp: 90 tablet, Rfl: 3 .  blood glucose meter kit and supplies KIT, Dispense based on patient and insurance preference. Use up to four times daily as directed. (FOR ICD-9 250.00, 250.01)., Disp: 1 each, Rfl: 0 .  Glucose Blood (BLOOD GLUCOSE TEST STRIPS) STRP, Use as directed to monitor FSBS once daily, for ICD 10 E11.65, Disp: 100 strip, Rfl: 3 .  lisinopril-hydrochlorothiazide (ZESTORETIC) 20-12.5 MG tablet, Take 1 tablet by mouth daily., Disp: 90 tablet, Rfl: 3 .  metFORMIN (GLUCOPHAGE) 1000 MG tablet, Take 1 tablet (1,000 mg total) by mouth 2 (two) times daily with a meal., Disp: 180 tablet, Rfl: 3 .  pantoprazole (PROTONIX) 40 MG tablet, TAKE 1 TABLET BY MOUTH EVERY DAY, Disp: 90 tablet, Rfl: 3  Patient Active Problem List   Diagnosis Date Noted  .  Severe nonproliferative diabetic retinopathy of left eye, with macular edema, associated with type 2 diabetes mellitus ([place]) 05/14/2019  . Severe nonproliferative diabetic retinopathy of right eye, with macular edema, associated with type 2 diabetes mellitus ([place]) 05/14/2019  . Retinal hemorrhage of right eye 05/14/2019  . Retinal hemorrhage of left eye 05/14/2019  . Nuclear sclerotic cataract of right eye 05/14/2019  . Nuclear sclerotic cataract of left eye 05/14/2019  . LVH (left ventricular hypertrophy) 03/23/2019  . Hyperlipidemia associated with  type 2 diabetes mellitus ([place]) 09/22/2018  . CKD stage 2 due to type 2 diabetes mellitus ([place]) 09/22/2018  . Uncontrolled type 2 diabetes mellitus with hyperglycemia ([place]) 04/15/2018  . Essential hypertension 04/15/2018  . DM (diabetes mellitus) with complications ([place]) 67/67/2094    Past Surgical History:  Procedure Laterality Date  . COLONOSCOPY WITH PROPOFOL N/A 01/15/2019   Procedure: COLONOSCOPY WITH PROPOFOL;  Surgeon: Jonathon Bellows, MD;  Location: [facility] ENDOSCOPY;  Service: Gastroenterology;  Laterality: N/A;    Family History  Problem Relation Age of Onset  . Kidney failure Sister   . Cancer Sister   . Diabetes Sister   . Hypertension Sister   . Hyperlipidemia Sister   . Heart disease Sister   . Kidney disease Sister   . Cancer Brother   . CAD Brother   . Diabetes Brother   . Hypertension Brother   . Hyperlipidemia Brother   . Heart disease Brother   . Diabetes Other   . Cancer Mother     Social History   Tobacco Use  . Smoking status: Never Smoker  . Smokeless tobacco: Never Used  Vaping Use  . Vaping Use: Never used  Substance Use Topics  . Alcohol use: Never  . Drug use: Never     No Known Allergies  Health Maintenance  Topic Date Due  . TETANUS/TDAP  Never done  . FOOT EXAM  04/12/2019  . PNA vac Low Risk Adult (1 of 2 - PCV13) 12/22/2019 (Originally 09/04/2018)  . INFLUENZA VACCINE  08/05/2019  . HEMOGLOBIN A1C  08/12/2019  . OPHTHALMOLOGY EXAM  07/16/2020  . COLONOSCOPY  01/15/2024  . COVID-19 Vaccine  Completed  . Hepatitis C Screening  Completed    Chart Review Today: I personally reviewed active problem list, medication list, allergies, family history, social history, health maintenance, notes from last encounter, lab results, imaging with the patient/caregiver today.   Review of Systems  10 Systems reviewed and are negative for acute change except as noted in the HPI.    Objective:   Vitals:   07/23/19 0925  BP: 124/78  Pulse: 98    Resp: 16  Temp: (!) 97.1 F (36.2 C)  TempSrc: Temporal  SpO2: 96%  Weight: 176 lb 12.8 oz (80.2 kg)  Height: '5\' 4"'  (1.626 m)    Body mass index is 30.35 kg/m.  Physical Exam Vitals and nursing note reviewed.  Constitutional:      General: He is not in acute distress.    Appearance: Normal appearance. He is well-developed. He is not ill-appearing, toxic-appearing or diaphoretic.     Interventions: Face mask in place.  HENT:     Head: Normocephalic and atraumatic.     Jaw: No trismus.     Right Ear: External ear normal.     Left Ear: External ear normal.  Eyes:     General: Lids are normal. No scleral icterus.    Conjunctiva/sclera: Conjunctivae normal.     Pupils: Pupils are equal, round, and  reactive to light.  Neck:     Trachea: Trachea and phonation normal. No tracheal deviation.  Cardiovascular:     Rate and Rhythm: Normal rate and regular rhythm.     Chest Wall: PMI is displaced.     Pulses: Normal pulses.          Radial pulses are 2+ on the right side and 2+ on the left side.       Posterior tibial pulses are 2+ on the right side and 2+ on the left side.     Heart sounds: Normal heart sounds. No murmur heard.  No friction rub. No gallop.   Pulmonary:     Effort: Pulmonary effort is normal. No tachypnea, accessory muscle usage or respiratory distress.     Breath sounds: No stridor. Examination of the right-lower field reveals decreased breath sounds and rales. Examination of the left-lower field reveals decreased breath sounds and rales. Decreased breath sounds and rales (very fine rales with deep inspiration R>L) present. No wheezing or rhonchi.  Abdominal:     General: Bowel sounds are normal. There is no distension.     Palpations: Abdomen is soft.     Tenderness: There is no abdominal tenderness. There is no guarding or rebound.  Musculoskeletal:        General: Normal range of motion.     Cervical back: Normal range of motion and neck supple.     Right  lower leg: No edema.     Left lower leg: No edema.  Skin:    General: Skin is warm and dry.     Capillary Refill: Capillary refill takes less than 2 seconds.     Coloration: Skin is not jaundiced.     Findings: No rash.     Nails: There is no clubbing.  Neurological:     Mental Status: He is alert.     Cranial Nerves: No dysarthria or facial asymmetry.     Motor: No tremor or abnormal muscle tone.     Gait: Gait normal.  Psychiatric:        Mood and Affect: Mood normal.        Speech: Speech normal.        Behavior: Behavior normal. Behavior is cooperative.      DM foot exam done - long great toe nails bilaterally - pt says no trouble cutting them   Diabetic Foot Exam - Simple   Simple Foot Form Diabetic Foot exam was performed with the following findings: Yes 07/23/2019  9:45 AM  Visual Inspection Sensation Testing Pulse Check Comments      Assessment & Plan:     ICD-10-CM   1. Controlled type 2 diabetes mellitus with microalbuminuria, unspecified whether long term insulin use (HCC)  E11.29 Hemoglobin A1C   R80.9    well controlled with metformin, pt asks for refill of glipizide - not on for years, will wait for labs, reviewed CBG monitoring and goal ranges  2. Essential hypertension  E99 COMPLETE METABOLIC PANEL WITH GFR   stable, well controlled today, continue to monitor  3. Hyperlipidemia associated with type 2 diabetes mellitus ([place])  E11.69    E78.5    compliant with statin, last labs reviewed, at goal, continue to monitor tolerance  4. CKD stage 2 due to type 2 diabetes mellitus (HCC)  B71.69 COMPLETE METABOLIC PANEL WITH GFR   N18.2    continue to monitor renal function, seeing nephro annually due to family hx  5. Encounter for  medication monitoring  P80.99 COMPLETE METABOLIC PANEL WITH GFR    Hemoglobin A1C  6. Need for vaccination with 13-polyvalent pneumococcal conjugate vaccine  Z23 Pneumococcal conjugate vaccine 13-valent  7. LVH (left ventricular  hypertrophy)  I51.7    pmi displaced, LCH/cardiomegaly - pt seeing cardiology, today no concerning cardiac sx     Return in about 6 months (around 01/23/2020) for Routine follow-up.   [name], PA-C 07/23/19 9:40 AM

## 2019-07-24 ENCOUNTER — Other Ambulatory Visit: Payer: Self-pay | Admitting: Family Medicine

## 2019-07-24 LAB — COMPLETE METABOLIC PANEL WITH GFR
AG Ratio: 1.5 (calc) (ref 1.0–2.5)
ALT: 9 U/L (ref 9–46)
AST: 12 U/L (ref 10–35)
Albumin: 4.2 g/dL (ref 3.6–5.1)
Alkaline phosphatase (APISO): 78 U/L (ref 35–144)
BUN: 24 mg/dL (ref 7–25)
CO2: 26 mmol/L (ref 20–32)
Calcium: 8.7 mg/dL (ref 8.6–10.3)
Chloride: 108 mmol/L (ref 98–110)
Creat: 1.21 mg/dL (ref 0.70–1.25)
GFR, Est African American: 72 mL/min/{1.73_m2} (ref >=60)
GFR, Est Non African American: 62 mL/min/{1.73_m2} (ref >=60)
Globulin: 2.8 g/dL (calc) (ref 1.9–3.7)
Glucose, Bld: 199 mg/dL — ABNORMAL HIGH (ref 65–99)
Potassium: 4.2 mmol/L (ref 3.5–5.3)
Sodium: 141 mmol/L (ref 135–146)
Total Bilirubin: 0.5 mg/dL (ref 0.2–1.2)
Total Protein: 7 g/dL (ref 6.1–8.1)

## 2019-07-24 LAB — HEMOGLOBIN A1C
Hgb A1c MFr Bld: 6.4 % of total Hgb — ABNORMAL HIGH (ref <5.7)
Mean Plasma Glucose: 137 (calc)
eAG (mmol/L): 7.6 (calc)

## 2019-07-31 ENCOUNTER — Encounter (INDEPENDENT_AMBULATORY_CARE_PROVIDER_SITE_OTHER): Payer: Self-pay | Admitting: Ophthalmology

## 2019-07-31 ENCOUNTER — Other Ambulatory Visit: Payer: Self-pay

## 2019-07-31 ENCOUNTER — Ambulatory Visit (INDEPENDENT_AMBULATORY_CARE_PROVIDER_SITE_OTHER): Payer: Medicare Other | Admitting: Ophthalmology

## 2019-07-31 DIAGNOSIS — E113412 Type 2 diabetes mellitus with severe nonproliferative diabetic retinopathy with macular edema, left eye: Secondary | ICD-10-CM

## 2019-07-31 NOTE — Progress Notes (Signed)
07/31/2019     CHIEF COMPLAINT Patient presents for Retina Follow Up   HISTORY OF PRESENT ILLNESS: Cameron Glover is a 69 y.o. male who presents to the clinic today for:   HPI    Retina Follow Up    Patient presents with  Diabetic Retinopathy.  In left eye.  This started 2 weeks ago.  Severity is mild.  Duration of 2 weeks.  Since onset it is stable.          Comments    2 Week Focal OS  Pt denies noticeable changes to [place] OU since last visit. Pt denies ocular pain, flashes of light, or floaters OU.  LBS: 142 this AM       Last edited by Rockie Neighbours, [place] on 07/31/2019  2:47 PM. (History)      Referring physician: [name], PA-C [address] [place],  [place] [id number]  HISTORICAL INFORMATION:   Selected notes from the MEDICAL RECORD NUMBER    Lab Results  Component Value Date   HGBA1C 6.4 (H) 07/23/2019     CURRENT MEDICATIONS: No current outpatient medications on file. (Ophthalmic Drugs)   No current facility-administered medications for this visit. (Ophthalmic Drugs)   Current Outpatient Medications (Other)  Medication Sig  . amLODipine (NORVASC) 10 MG tablet Take 1 tablet (10 mg total) by mouth daily.  Marland Kitchen atorvastatin (LIPITOR) 40 MG tablet Take 1 tablet (40 mg total) by mouth daily.  . blood glucose meter kit and supplies KIT Dispense based on patient and insurance preference. Use up to four times daily as directed. (FOR ICD-9 250.00, 250.01).  . Glucose Blood (BLOOD GLUCOSE TEST STRIPS) STRP Use as directed to monitor FSBS once daily, for ICD 10 E11.65  . lisinopril-hydrochlorothiazide (ZESTORETIC) 20-12.5 MG tablet Take 1 tablet by mouth daily.  . metFORMIN (GLUCOPHAGE) 1000 MG tablet Take 1 tablet (1,000 mg total) by mouth 2 (two) times daily with a meal.  . pantoprazole (PROTONIX) 40 MG tablet TAKE 1 TABLET BY MOUTH EVERY DAY   No current facility-administered medications for this visit. (Other)      REVIEW OF  SYSTEMS:    ALLERGIES No Known Allergies  PAST MEDICAL HISTORY Past Medical History:  Diagnosis Date  . Acute renal failure (ARF) ([place])   . AKI (acute kidney injury) ([place]) 09/02/2017  . Anemia due to chronic kidney disease 04/15/2018  . Diabetes mellitus without complication ([place])   . Elevated lipase 04/15/2018  . Hyperglycemia without ketosis   . Hyperlipidemia   . Hypertension    Past Surgical History:  Procedure Laterality Date  . COLONOSCOPY WITH PROPOFOL N/A 01/15/2019   Procedure: COLONOSCOPY WITH PROPOFOL;  Surgeon: Jonathon Bellows, MD;  Location: [facility] ENDOSCOPY;  Service: Gastroenterology;  Laterality: N/A;    FAMILY HISTORY Family History  Problem Relation Age of Onset  . Kidney failure Sister   . Cancer Sister   . Diabetes Sister   . Hypertension Sister   . Hyperlipidemia Sister   . Heart disease Sister   . Kidney disease Sister   . Cancer Brother   . CAD Brother   . Diabetes Brother   . Hypertension Brother   . Hyperlipidemia Brother   . Heart disease Brother   . Diabetes Other   . Cancer Mother     SOCIAL HISTORY Social History   Tobacco Use  . Smoking status: Never Smoker  . Smokeless tobacco: Never Used  Vaping Use  . Vaping Use: Never used  Substance  Use Topics  . Alcohol use: Never  . Drug use: Never         OPHTHALMIC EXAM: Base Eye Exam    Visual Acuity (ETDRS)      Right Left   Dist [place] 20/20 -1 20/20       Tonometry (Tonopen, 2:51 PM)      Right Left   Pressure 16 18       Pupils      Pupils Dark Light Shape React APD   Right PERRL 4 3 Round Sluggish None   Left PERRL 5 4 Round Sluggish None       Visual Fields (Counting fingers)      Left Right    Full Full       Extraocular Movement      Right Left    Full Full       Neuro/Psych    Oriented x3: Yes   Mood/Affect: Normal       Dilation    Left eye: 1.0% Mydriacyl, 2.5% Phenylephrine @ 2:51 PM          IMAGING AND PROCEDURES  Imaging and Procedures for  07/31/19  OCT, Retina - OU - Both Eyes       Right Eye Quality was good. Scan locations included subfoveal. Central Foveal Thickness: 290. Progression has been stable.   Left Eye Quality was good. Scan locations included subfoveal. Central Foveal Thickness: 281. Progression has improved.   Notes CSME, improved after recent intravitreal Avastin.  We will treat inferior and temporal to the fovea today with focal for a long duration control       Focal Laser - OS - Left Eye       Anesthesia Topical anesthesia was used. Anesthetic medications included Proparacaine 0.5%.   Laser Information The type of laser was diode. Color was yellow. The duration in seconds was 0.1. The spot size was 100 microns. Laser power was 50. Total spots was 48.   Post-op The patient tolerated the procedure well. There were no complications. The patient received written and verbal post procedure care education.                 ASSESSMENT/PLAN:  No problem-specific Assessment & Plan notes found for this encounter.      ICD-10-CM   1. Severe nonproliferative diabetic retinopathy of left eye, with macular edema, associated with type 2 diabetes mellitus (HCC)  E11.3412 OCT, Retina - OU - Both Eyes    Focal Laser - OS - Left Eye    1.  2.  3.  Ophthalmic Meds Ordered this visit:  No orders of the defined types were placed in this encounter.      No follow-ups on file.  There are no Patient Instructions on file for this visit.   Explained the diagnoses, plan, and follow up with the patient and they expressed understanding.  Patient expressed understanding of the importance of proper follow up care.   Alford Highland Raeana Blinn M.D. Diseases & Surgery of the Retina and Vitreous [facility] 07/31/19     Abbreviations: M myopia (nearsighted); A astigmatism; H hyperopia (farsighted); P presbyopia; Mrx spectacle prescription;  CTL contact lenses; OD right eye; OS left eye; OU  both eyes  XT exotropia; ET esotropia; PEK punctate epithelial keratitis; PEE punctate epithelial erosions; DES dry eye syndrome; MGD meibomian gland dysfunction; ATs artificial tears; PFAT's preservative free artificial tears; NSC nuclear sclerotic cataract; PSC posterior subcapsular cataract; ERM epi-retinal membrane;  PVD posterior vitreous detachment; RD retinal detachment; DM diabetes mellitus; DR diabetic retinopathy; NPDR non-proliferative diabetic retinopathy; PDR proliferative diabetic retinopathy; CSME clinically significant macular edema; DME diabetic macular edema; dbh dot blot hemorrhages; CWS cotton wool spot; POAG primary open angle glaucoma; C/D cup-to-disc ratio; HVF humphrey visual field; GVF goldmann visual field; OCT optical coherence tomography; IOP intraocular pressure; BRVO Branch retinal vein occlusion; CRVO central retinal vein occlusion; CRAO central retinal artery occlusion; BRAO branch retinal artery occlusion; RT retinal tear; SB scleral buckle; PPV pars plana vitrectomy; VH Vitreous hemorrhage; PRP panretinal laser photocoagulation; IVK intravitreal kenalog; VMT vitreomacular traction; MH Macular hole;  NVD neovascularization of the disc; NVE neovascularization elsewhere; AREDS age related eye disease study; ARMD age related macular degeneration; POAG primary open angle glaucoma; EBMD epithelial/anterior basement membrane dystrophy; ACIOL anterior chamber intraocular lens; IOL intraocular lens; PCIOL posterior chamber intraocular lens; Phaco/IOL phacoemulsification with intraocular lens placement; [place] photorefractive keratectomy; LASIK laser assisted in situ keratomileusis; HTN hypertension; DM diabetes mellitus; COPD chronic obstructive pulmonary disease

## 2019-10-03 ENCOUNTER — Other Ambulatory Visit: Payer: Self-pay | Admitting: Family Medicine

## 2019-10-03 DIAGNOSIS — E119 Type 2 diabetes mellitus without complications: Secondary | ICD-10-CM

## 2019-12-03 ENCOUNTER — Encounter (INDEPENDENT_AMBULATORY_CARE_PROVIDER_SITE_OTHER): Payer: Self-pay | Admitting: Ophthalmology

## 2019-12-03 ENCOUNTER — Other Ambulatory Visit: Payer: Self-pay

## 2019-12-03 ENCOUNTER — Ambulatory Visit (INDEPENDENT_AMBULATORY_CARE_PROVIDER_SITE_OTHER): Payer: Medicare Other | Admitting: Ophthalmology

## 2019-12-03 DIAGNOSIS — H2511 Age-related nuclear cataract, right eye: Secondary | ICD-10-CM

## 2019-12-03 DIAGNOSIS — E113412 Type 2 diabetes mellitus with severe nonproliferative diabetic retinopathy with macular edema, left eye: Secondary | ICD-10-CM | POA: Diagnosis not present

## 2019-12-03 DIAGNOSIS — H2512 Age-related nuclear cataract, left eye: Secondary | ICD-10-CM | POA: Diagnosis not present

## 2019-12-03 DIAGNOSIS — E113411 Type 2 diabetes mellitus with severe nonproliferative diabetic retinopathy with macular edema, right eye: Secondary | ICD-10-CM | POA: Diagnosis not present

## 2019-12-03 NOTE — Assessment & Plan Note (Signed)
OS and OD with new onset perifoveal CSME from severe NPDR progression.  Will need fluorescein angiography each eye soon and consideration of intravitreal Avastin, likely OD first

## 2019-12-03 NOTE — Assessment & Plan Note (Signed)
Similar discussion as that under cataract left eye

## 2019-12-03 NOTE — Progress Notes (Signed)
12/03/2019     CHIEF COMPLAINT Patient presents for Retina Follow Up   HISTORY OF PRESENT ILLNESS: Cameron Glover is a 69 y.o. male who presents to the clinic today for:   HPI    Retina Follow Up    Patient presents with  Diabetic Retinopathy.  In both eyes.  This started 4 months ago.  Severity is mild.  Duration of 4 months.  Since onset it is stable.          Comments    4 Mo Fu OU   Pt reports cloudy vision OS, no new f/f, no pain or pressure.     Last A1C: around 6 taken 06/2019    Last BS: pt unsure, doesn't check regularly.         Last edited by Nichola Sizer D on 12/03/2019  2:35 PM. (History)      Referring physician: [name], PA-C [address] [place],  [place] [address]  HISTORICAL INFORMATION:   Selected notes from the MEDICAL RECORD NUMBER    Lab Results  Component Value Date   HGBA1C 6.4 (H) 07/23/2019     CURRENT MEDICATIONS: No current outpatient medications on file. (Ophthalmic Drugs)   No current facility-administered medications for this visit. (Ophthalmic Drugs)   Current Outpatient Medications (Other)  Medication Sig  . amLODipine (NORVASC) 10 MG tablet Take 1 tablet (10 mg total) by mouth daily.  Marland Kitchen atorvastatin (LIPITOR) 40 MG tablet Take 1 tablet (40 mg total) by mouth daily.  . blood glucose meter kit and supplies KIT Dispense based on patient and insurance preference. Use up to four times daily as directed. (FOR ICD-9 250.00, 250.01).  . Glucose Blood (BLOOD GLUCOSE TEST STRIPS) STRP Use as directed to monitor FSBS once daily, for ICD 10 E11.65  . lisinopril-hydrochlorothiazide (ZESTORETIC) 20-12.5 MG tablet Take 1 tablet by mouth daily.  . metFORMIN (GLUCOPHAGE) 1000 MG tablet TAKE 1 TABLET (1,000 MG TOTAL) BY MOUTH 2 (TWO) TIMES DAILY WITH A MEAL.  . pantoprazole (PROTONIX) 40 MG tablet TAKE 1 TABLET BY MOUTH EVERY DAY   No current facility-administered medications for this visit. (Other)       REVIEW OF SYSTEMS:    ALLERGIES No Known Allergies  PAST MEDICAL HISTORY Past Medical History:  Diagnosis Date  . Acute renal failure (ARF) ([place])   . AKI (acute kidney injury) ([place]) 09/02/2017  . Anemia due to chronic kidney disease 04/15/2018  . Diabetes mellitus without complication ([place])   . Elevated lipase 04/15/2018  . Hyperglycemia without ketosis   . Hyperlipidemia   . Hypertension    Past Surgical History:  Procedure Laterality Date  . COLONOSCOPY WITH PROPOFOL N/A 01/15/2019   Procedure: COLONOSCOPY WITH PROPOFOL;  Surgeon: Jonathon Bellows, MD;  Location: [facility] ENDOSCOPY;  Service: Gastroenterology;  Laterality: N/A;    FAMILY HISTORY Family History  Problem Relation Age of Onset  . Kidney failure Sister   . Cancer Sister   . Diabetes Sister   . Hypertension Sister   . Hyperlipidemia Sister   . Heart disease Sister   . Kidney disease Sister   . Cancer Brother   . CAD Brother   . Diabetes Brother   . Hypertension Brother   . Hyperlipidemia Brother   . Heart disease Brother   . Diabetes Other   . Cancer Mother     SOCIAL HISTORY Social History   Tobacco Use  . Smoking status: Never Smoker  . Smokeless tobacco: Never Used  Vaping Use  . Vaping Use: Never used  Substance Use Topics  . Alcohol use: Never  . Drug use: Never         OPHTHALMIC EXAM:  Base Eye Exam    Visual Acuity (ETDRS)      Right Left   Dist [place] 20/20 20/25       Tonometry (Tonopen, 2:41 PM)      Right Left   Pressure 17 17       Pupils      Dark Light Shape React APD   Right 4 3 Round Sluggish None   Left 5 4 Round Sluggish None       Visual Fields (Counting fingers)      Left Right    Full Full       Extraocular Movement      Right Left    Full Full       Neuro/Psych    Oriented x3: Yes   Mood/Affect: Normal       Dilation    Both eyes: 1.0% Mydriacyl, 2.5% Phenylephrine @ 2:41 PM        Slit Lamp and Fundus Exam    External Exam      Right  Left   External Normal Normal       Slit Lamp Exam      Right Left   Lids/Lashes Normal Normal   Conjunctiva/Sclera White and quiet White and quiet   Cornea Clear Clear   Anterior Chamber Deep and quiet Deep and quiet   Iris Round and reactive Round and reactive   Lens 3+ Nuclear sclerosis 3+ Nuclear sclerosis   Anterior Vitreous Normal Normal       Fundus Exam      Right Left   Posterior Vitreous Normal Posterior vitreous detachment   Disc Normal Normal   C/D Ratio 0.5 0.5   Macula Microaneurysms, no macular thickening Microaneurysms, , Mild clinically significant macular edema   Vessels NPDR severe,  NPDR severe   Periphery Cotton wool spots Cotton wool spots          IMAGING AND PROCEDURES  Imaging and Procedures for 12/03/19  OCT, Retina - OU - Both Eyes       Right Eye Quality was good. Scan locations included subfoveal. Central Foveal Thickness: 328. Progression has worsened. Findings include vitreomacular adhesion , vitreous traction.   Left Eye Quality was good. Scan locations included subfoveal. Central Foveal Thickness: 297. Progression has been stable. Findings include vitreomacular adhesion .   Notes CSME superiorly, new OD  OS, will need to monitor carefully inferotemporally and inferior to the macula.                ASSESSMENT/PLAN:  Severe nonproliferative diabetic retinopathy of right eye, with macular edema, associated with type 2 diabetes mellitus (HCC) CSME OD increase superiorly, will need to consider intravitreal Avastin soon  Nuclear sclerotic cataract of left eye The nature of cataract was discussed with the patient as well as the elective nature of surgery. The patient was reassured that surgery at a later date does not put the patient at risk for a worse outcome. It was emphasized that the need for surgery is dictated by the patient's quality of life as influenced by the cataract. Patient was instructed to maintain close follow up  with their general eye care doctor.  Nuclear sclerotic cataract of right eye Similar discussion as that under cataract left eye  Severe nonproliferative diabetic retinopathy of left eye,  with macular edema, associated with type 2 diabetes mellitus ([place]) OS and OD with new onset perifoveal CSME from severe NPDR progression.  Will need fluorescein angiography each eye soon and consideration of intravitreal Avastin, likely OD first      ICD-10-CM   1. Severe nonproliferative diabetic retinopathy of left eye, with macular edema, associated with type 2 diabetes mellitus (HCC)  Y05.1102 OCT, Retina - OU - Both Eyes  2. Severe nonproliferative diabetic retinopathy of right eye, with macular edema, associated with type 2 diabetes mellitus ([place])  E11.3411   3. Nuclear sclerotic cataract of left eye  H25.12   4. Nuclear sclerotic cataract of right eye  H25.11     1.  Dilate OU next with \macular perfusion studies via fluorescein angiography OU, will need intravitreal Avastin OD next  2.  3.  Ophthalmic Meds Ordered this visit:  No orders of the defined types were placed in this encounter.      Return in about 1 week (around 12/10/2019) for DILATE OU, OPTOS FFA R/L, COLOR FP, AVASTIN OCT, OD.  There are no Patient Instructions on file for this visit.   Explained the diagnoses, plan, and follow up with the patient and they expressed understanding.  Patient expressed understanding of the importance of proper follow up care.   Clent Demark Trystyn Dolley M.D. Diseases & Surgery of the Retina and Vitreous Retina & Diabetic [place] 12/03/19     Abbreviations: M myopia (nearsighted); A astigmatism; H hyperopia (farsighted); P presbyopia; Mrx spectacle prescription;  CTL contact lenses; OD right eye; OS left eye; OU both eyes  XT exotropia; ET esotropia; PEK punctate epithelial keratitis; PEE punctate epithelial erosions; DES dry eye syndrome; MGD meibomian gland dysfunction; ATs artificial tears;  PFAT's preservative free artificial tears; [place] nuclear sclerotic cataract; PSC posterior subcapsular cataract; ERM epi-retinal membrane; PVD posterior vitreous detachment; RD retinal detachment; DM diabetes mellitus; DR diabetic retinopathy; NPDR non-proliferative diabetic retinopathy; PDR proliferative diabetic retinopathy; CSME clinically significant macular edema; DME diabetic macular edema; dbh dot blot hemorrhages; CWS cotton wool spot; POAG primary open angle glaucoma; C/D cup-to-disc ratio; HVF humphrey visual field; GVF goldmann visual field; OCT optical coherence tomography; IOP intraocular pressure; BRVO Branch retinal vein occlusion; CRVO central retinal vein occlusion; CRAO central retinal artery occlusion; BRAO branch retinal artery occlusion; RT retinal tear; SB scleral buckle; PPV pars plana vitrectomy; VH Vitreous hemorrhage; PRP panretinal laser photocoagulation; IVK intravitreal kenalog; VMT vitreomacular traction; MH Macular hole;  NVD neovascularization of the disc; NVE neovascularization elsewhere; AREDS age related eye disease study; ARMD age related macular degeneration; POAG primary open angle glaucoma; EBMD epithelial/anterior basement membrane dystrophy; ACIOL anterior chamber intraocular lens; IOL intraocular lens; PCIOL posterior chamber intraocular lens; Phaco/IOL phacoemulsification with intraocular lens placement; [place] photorefractive keratectomy; LASIK laser assisted in situ keratomileusis; HTN hypertension; DM diabetes mellitus; COPD chronic obstructive pulmonary disease

## 2019-12-03 NOTE — Assessment & Plan Note (Signed)
CSME OD increase superiorly, will need to consider intravitreal Avastin soon

## 2019-12-03 NOTE — Assessment & Plan Note (Signed)

## 2019-12-13 ENCOUNTER — Ambulatory Visit (INDEPENDENT_AMBULATORY_CARE_PROVIDER_SITE_OTHER): Payer: Medicare Other | Admitting: Ophthalmology

## 2019-12-13 ENCOUNTER — Other Ambulatory Visit: Payer: Self-pay

## 2019-12-13 ENCOUNTER — Encounter (INDEPENDENT_AMBULATORY_CARE_PROVIDER_SITE_OTHER): Payer: Self-pay | Admitting: Ophthalmology

## 2019-12-13 DIAGNOSIS — E113412 Type 2 diabetes mellitus with severe nonproliferative diabetic retinopathy with macular edema, left eye: Secondary | ICD-10-CM

## 2019-12-13 DIAGNOSIS — E113411 Type 2 diabetes mellitus with severe nonproliferative diabetic retinopathy with macular edema, right eye: Secondary | ICD-10-CM

## 2019-12-13 MED ORDER — FLUORESCEIN SODIUM 10 % IV SOLN
500.0000 mg | INTRAVENOUS | Status: AC | PRN
  Administered 2019-12-13: 500 mg via INTRAVENOUS

## 2019-12-13 MED ORDER — BEVACIZUMAB 2.5 MG/0.1ML IZ SOSY
2.5000 mg | PREFILLED_SYRINGE | INTRAVITREAL | Status: AC | PRN
  Administered 2019-12-13: 2.5 mg via INTRAVITREAL

## 2019-12-13 NOTE — Patient Instructions (Signed)
Patient instructed to contact the office promptly for new onset visual acuity declines or distortions 

## 2019-12-13 NOTE — Assessment & Plan Note (Signed)
OCT and fluorescein angiography confirmed center involved CSME OD, will need intravitreal Avastin today and follow-up again in 6 weeks

## 2019-12-13 NOTE — Progress Notes (Signed)
12/13/2019     CHIEF COMPLAINT Patient presents for Retina Follow Up   HISTORY OF PRESENT ILLNESS: Cameron Glover is a 69 y.o. male who presents to the clinic today for:   HPI    Retina Follow Up    Patient presents with  Diabetic Retinopathy.  In both eyes.  Severity is moderate.  Duration of 1 week.  Since onset it is stable.  I, the attending physician,  performed the HPI with the patient and updated documentation appropriately.          Comments    FFA R/L. FP and OCT. Possible Avastin OD.  Pt states no changes or issues since last visit.  BGL: does not remember       Last edited by Tilda Franco on 12/13/2019  3:41 PM. (History)      Referring physician: [name], PA-C [address] [place],  [place] [address]  HISTORICAL INFORMATION:   Selected notes from the MEDICAL RECORD NUMBER    Lab Results  Component Value Date   HGBA1C 6.4 (H) 07/23/2019     CURRENT MEDICATIONS: No current outpatient medications on file. (Ophthalmic Drugs)   No current facility-administered medications for this visit. (Ophthalmic Drugs)   Current Outpatient Medications (Other)  Medication Sig  . amLODipine (NORVASC) 10 MG tablet Take 1 tablet (10 mg total) by mouth daily.  Marland Kitchen atorvastatin (LIPITOR) 40 MG tablet Take 1 tablet (40 mg total) by mouth daily.  . blood glucose meter kit and supplies KIT Dispense based on patient and insurance preference. Use up to four times daily as directed. (FOR ICD-9 250.00, 250.01).  . Glucose Blood (BLOOD GLUCOSE TEST STRIPS) STRP Use as directed to monitor FSBS once daily, for ICD 10 E11.65  . lisinopril-hydrochlorothiazide (ZESTORETIC) 20-12.5 MG tablet Take 1 tablet by mouth daily.  . metFORMIN (GLUCOPHAGE) 1000 MG tablet TAKE 1 TABLET (1,000 MG TOTAL) BY MOUTH 2 (TWO) TIMES DAILY WITH A MEAL.  . pantoprazole (PROTONIX) 40 MG tablet TAKE 1 TABLET BY MOUTH EVERY DAY   No current facility-administered medications for this visit.  (Other)      REVIEW OF SYSTEMS: ROS    Positive for: Endocrine   Last edited by Tilda Franco on 12/13/2019  3:41 PM. (History)       ALLERGIES No Known Allergies  PAST MEDICAL HISTORY Past Medical History:  Diagnosis Date  . Acute renal failure (ARF) ([place])   . AKI (acute kidney injury) ([place]) 09/02/2017  . Anemia due to chronic kidney disease 04/15/2018  . Diabetes mellitus without complication ([place])   . Elevated lipase 04/15/2018  . Hyperglycemia without ketosis   . Hyperlipidemia   . Hypertension    Past Surgical History:  Procedure Laterality Date  . COLONOSCOPY WITH PROPOFOL N/A 01/15/2019   Procedure: COLONOSCOPY WITH PROPOFOL;  Surgeon: Jonathon Bellows, MD;  Location: [facility] ENDOSCOPY;  Service: Gastroenterology;  Laterality: N/A;    FAMILY HISTORY Family History  Problem Relation Age of Onset  . Kidney failure Sister   . Cancer Sister   . Diabetes Sister   . Hypertension Sister   . Hyperlipidemia Sister   . Heart disease Sister   . Kidney disease Sister   . Cancer Brother   . CAD Brother   . Diabetes Brother   . Hypertension Brother   . Hyperlipidemia Brother   . Heart disease Brother   . Diabetes Other   . Cancer Mother     SOCIAL HISTORY Social History  Tobacco Use  . Smoking status: Never Smoker  . Smokeless tobacco: Never Used  Vaping Use  . Vaping Use: Never used  Substance Use Topics  . Alcohol use: Never  . Drug use: Never         OPHTHALMIC EXAM:  Base Eye Exam    Visual Acuity (Snellen - Linear)      Right Left   Dist [place] 20/20 -2 20/30       Tonometry (Tonopen, 3:45 PM)      Right Left   Pressure 14 15       Pupils      Pupils Dark Light Shape React APD   Right PERRL 4 3 Round Sluggish None   Left PERRL 4 3 Round Sluggish None       Neuro/Psych    Oriented x3: Yes   Mood/Affect: Normal       Dilation    Both eyes: 1.0% Mydriacyl, 2.5% Phenylephrine @ 3:45 PM        Slit Lamp and Fundus Exam    External Exam       Right Left   External Normal Normal       Slit Lamp Exam      Right Left   Lids/Lashes Normal Normal   Conjunctiva/Sclera White and quiet White and quiet   Cornea Clear Clear   Anterior Chamber Deep and quiet Deep and quiet   Iris Round and reactive Round and reactive   Lens 3+ Nuclear sclerosis 3+ Nuclear sclerosis   Anterior Vitreous Normal Normal       Fundus Exam      Right Left   Posterior Vitreous Normal Posterior vitreous detachment   Disc Normal Normal   C/D Ratio 0.5 0.5   Macula Microaneurysms, no macular thickening Microaneurysms, , Mild clinically significant macular edema   Vessels NPDR severe,  NPDR severe   Periphery Cotton wool spots Cotton wool spots          IMAGING AND PROCEDURES  Imaging and Procedures for 12/13/19  OCT, Retina - OU - Both Eyes       Right Eye Quality was good. Scan locations included subfoveal. Central Foveal Thickness: 328. Progression has worsened. Findings include vitreomacular adhesion , vitreous traction.   Left Eye Quality was good. Scan locations included subfoveal. Central Foveal Thickness: 297. Progression has been stable. Findings include vitreomacular adhesion .   Notes CSME superiorly, new OD  OS, will need to monitor carefully inferotemporally and inferior to the macula.       Color Fundus Photography Optos - OU - Both Eyes       Right Eye Progression has no prior data. Disc findings include normal observations. Macula : microaneurysms.   Left Eye Progression has no prior data. Disc findings include normal observations. Macula : microaneurysms.        Fluorescein Angiography Optos (Transit OD)       Injection:  500 mg Fluorescein Sodium 10 % injection   NDC: [phone]   Route: IntravenousRight Eye   Progression has no prior data. Early phase findings include leakage, microaneurysm. Mid/Late phase findings include leakage, microaneurysm.   Left Eye   Progression has no prior data.  Mid/Late phase findings include leakage, microaneurysm.   Notes Severe nonproliferative diabetic retinopathy OU, CSME OD,,,, angiographic macular edema OS       Intravitreal Injection, Pharmacologic Agent - OD - Right Eye       Time Out 12/13/2019. 4:44 PM. Confirmed correct patient, procedure,  site, and patient consented.   Anesthesia Topical anesthesia was used. Anesthetic medications included [place] 3.5%.   Procedure Preparation included Ofloxacin , 10% betadine to eyelids, 5% betadine to ocular surface. A 30 gauge needle was used.   Injection:  2.5 mg Bevacizumab (AVASTIN) 2.52m/0.1mL SOSY   NDC: 784132-440-10 Lot:: [id number]  Route: Intravitreal, Site: Right Eye  Post-op Post injection exam found visual acuity of at least counting fingers. The patient tolerated the procedure well. There were no complications. The patient received written and verbal post procedure care education. Post injection medications were not given.                 ASSESSMENT/PLAN:  Severe nonproliferative diabetic retinopathy of right eye, with macular edema, associated with type 2 diabetes mellitus (HCC) OCT and fluorescein angiography confirmed center involved CSME OD, will need intravitreal Avastin today and follow-up again in 6 weeks  Severe nonproliferative diabetic retinopathy of left eye, with macular edema, associated with type 2 diabetes mellitus ([place] Angiographic CME, OS with severe NPDR, will observe      ICD-10-CM   1. Severe nonproliferative diabetic retinopathy of right eye, with macular edema, associated with type 2 diabetes mellitus (HCC)  E11.3411 OCT, Retina - OU - Both Eyes    Color Fundus Photography Optos - OU - Both Eyes    Fluorescein Angiography Optos (Transit OD)    Fluorescein Sodium 10 % injection 500 mg    Intravitreal Injection, Pharmacologic Agent - OD - Right Eye    bevacizumab (AVASTIN) SOSY 2.5 mg  2. Severe nonproliferative diabetic retinopathy of left eye,  with macular edema, associated with type 2 diabetes mellitus (HCC)  EY40.3474OCT, Retina - OU - Both Eyes    Color Fundus Photography Optos - OU - Both Eyes    Fluorescein Angiography Optos (Transit OD)    Fluorescein Sodium 10 % injection 500 mg    1.  Findings of diabetic macular edema, center involvement reviewed with the patient via angiogram and OCT.  Clinically significant MAC edema present OD center involved, will commence intravitreal Avastin OD today  2.  Dilate OD next in 6 weeks and consider intravitreal Avastin  3.  OS will continue to monitor  Ophthalmic Meds Ordered this visit:  Meds ordered this encounter  Medications  . Fluorescein Sodium 10 % injection 500 mg  . bevacizumab (AVASTIN) SOSY 2.5 mg       Return in about 6 weeks (around 01/24/2020) for dilate, OD, AVASTIN OCT.  Patient Instructions  Patient instructed to contact the office promptly for new onset visual acuity declines or distortions    Explained the diagnoses, plan, and follow up with the patient and they expressed understanding.  Patient expressed understanding of the importance of proper follow up care.   GClent DemarkRankin M.D. Diseases & Surgery of the Retina and Vitreous Retina & Diabetic [place]12/09/21     Abbreviations: M myopia (nearsighted); A astigmatism; H hyperopia (farsighted); P presbyopia; Mrx spectacle prescription;  CTL contact lenses; OD right eye; OS left eye; OU both eyes  XT exotropia; ET esotropia; PEK punctate epithelial keratitis; PEE punctate epithelial erosions; DES dry eye syndrome; MGD meibomian gland dysfunction; ATs artificial tears; PFAT's preservative free artificial tears; [place] sclerotic cataract; PSC posterior subcapsular cataract; ERM epi-retinal membrane; PVD posterior vitreous detachment; RD retinal detachment; DM diabetes mellitus; DR diabetic retinopathy; NPDR non-proliferative diabetic retinopathy; PDR proliferative diabetic retinopathy; CSME  clinically significant macular edema; DME diabetic macular edema; dbh dot  blot hemorrhages; CWS cotton wool spot; POAG primary open angle glaucoma; C/D cup-to-disc ratio; HVF humphrey visual field; GVF goldmann visual field; OCT optical coherence tomography; IOP intraocular pressure; BRVO Branch retinal vein occlusion; CRVO central retinal vein occlusion; CRAO central retinal artery occlusion; BRAO branch retinal artery occlusion; RT retinal tear; SB scleral buckle; PPV pars plana vitrectomy; VH Vitreous hemorrhage; PRP panretinal laser photocoagulation; IVK intravitreal kenalog; VMT vitreomacular traction; MH Macular hole;  NVD neovascularization of the disc; NVE neovascularization elsewhere; AREDS age related eye disease study; ARMD age related macular degeneration; POAG primary open angle glaucoma; EBMD epithelial/anterior basement membrane dystrophy; ACIOL anterior chamber intraocular lens; IOL intraocular lens; PCIOL posterior chamber intraocular lens; Phaco/IOL phacoemulsification with intraocular lens placement; [place] photorefractive keratectomy; LASIK laser assisted in situ keratomileusis; HTN hypertension; DM diabetes mellitus; COPD chronic obstructive pulmonary disease

## 2019-12-13 NOTE — Assessment & Plan Note (Signed)
Angiographic CME, OS with severe NPDR, will observe

## 2020-01-15 ENCOUNTER — Ambulatory Visit: Payer: Medicare Other | Attending: Internal Medicine

## 2020-01-15 ENCOUNTER — Other Ambulatory Visit: Payer: Medicare Other

## 2020-01-15 DIAGNOSIS — Z23 Encounter for immunization: Secondary | ICD-10-CM

## 2020-01-15 DIAGNOSIS — Z20822 Contact with and (suspected) exposure to covid-19: Secondary | ICD-10-CM

## 2020-01-15 NOTE — Progress Notes (Signed)
   Covid-19 Vaccination Clinic  Name:  Cameron Glover    MRN: [id number] DOB: 10-28-1950  01/15/2020  Cameron Glover was observed post Covid-19 immunization for 15 minutes without incident. He was provided with Vaccine Information Sheet and instruction to access the V-Safe system.   Cameron Glover was instructed to call 911 with any severe reactions post vaccine: Marland Kitchen Difficulty breathing  . Swelling of face and throat  . A fast heartbeat  . A bad rash all over body  . Dizziness and weakness   Immunizations Administered    Name Date Dose VIS Date Route   Pfizer COVID-19 Vaccine 01/15/2020  2:59 PM 0.3 mL 10/24/2019 Intramuscular   Manufacturer: [place]   Lot: [id number]   [place]: [id number]

## 2020-01-17 ENCOUNTER — Other Ambulatory Visit: Payer: Self-pay | Admitting: Family Medicine

## 2020-01-17 DIAGNOSIS — I160 Hypertensive urgency: Secondary | ICD-10-CM

## 2020-01-17 LAB — NOVEL CORONAVIRUS, NAA: SARS-CoV-2, NAA: NOT DETECTED

## 2020-01-17 LAB — SARS-COV-2, NAA 2 DAY TAT

## 2020-01-17 MED ORDER — AMLODIPINE BESYLATE 10 MG PO TABS: 10.0000 mg | ORAL_TABLET | Freq: Every day | ORAL | 3 refills | Status: DC

## 2020-01-17 MED ORDER — LISINOPRIL-HYDROCHLOROTHIAZIDE 20-12.5 MG PO TABS
1.0000 | ORAL_TABLET | Freq: Every day | ORAL | 3 refills | Status: DC
Start: 2020-01-17 — End: 2021-01-03

## 2020-01-17 NOTE — Addendum Note (Signed)
Addended by: Jazmen Lindenbaum on: 01/17/2020 04:49 PM   Modules accepted: Orders  

## 2020-01-23 ENCOUNTER — Other Ambulatory Visit: Payer: Self-pay

## 2020-01-23 ENCOUNTER — Ambulatory Visit (INDEPENDENT_AMBULATORY_CARE_PROVIDER_SITE_OTHER): Payer: Medicare Other | Admitting: Ophthalmology

## 2020-01-23 ENCOUNTER — Encounter (INDEPENDENT_AMBULATORY_CARE_PROVIDER_SITE_OTHER): Payer: Self-pay | Admitting: Ophthalmology

## 2020-01-23 ENCOUNTER — Encounter (INDEPENDENT_AMBULATORY_CARE_PROVIDER_SITE_OTHER): Payer: Medicare Other | Admitting: Ophthalmology

## 2020-01-23 DIAGNOSIS — E113412 Type 2 diabetes mellitus with severe nonproliferative diabetic retinopathy with macular edema, left eye: Secondary | ICD-10-CM | POA: Diagnosis not present

## 2020-01-23 DIAGNOSIS — E113411 Type 2 diabetes mellitus with severe nonproliferative diabetic retinopathy with macular edema, right eye: Secondary | ICD-10-CM

## 2020-01-23 MED ORDER — BEVACIZUMAB 2.5 MG/0.1ML IZ SOSY
2.5000 mg | PREFILLED_SYRINGE | INTRAVITREAL | Status: AC | PRN
Start: 2020-01-23 — End: 2020-01-23
  Administered 2020-01-23: 2.5 mg via INTRAVITREAL

## 2020-01-23 NOTE — Assessment & Plan Note (Signed)
CSME much improved, will repeat injection Avastin today at 6-week follow-up.

## 2020-01-23 NOTE — Assessment & Plan Note (Signed)
OS, center involved CSME much worse off therapy will need to resume Therapy promptly

## 2020-01-23 NOTE — Progress Notes (Signed)
01/23/2020     CHIEF COMPLAINT Patient presents for Retina Follow Up (6 WK FU OD, POSS AVASTIN OD ///Pt reports cloudy vision OS, OD stable, pt denies F/F OU, no pain or pressure OU. ////Last A1C: unsure, will have an appt tomorrow//Last IN:OMVEHM 120 this AM. )   HISTORY OF PRESENT ILLNESS: Cameron Glover is a 70 y.o. male who presents to the clinic today for:   HPI    Retina Follow Up    Patient presents with  Diabetic Retinopathy.  In right eye.  This started 6 weeks ago.  Duration of 6 weeks. Additional comments: 6 WK FU OD, POSS AVASTIN OD    Pt reports cloudy vision OS, OD stable, pt denies F/F OU, no pain or pressure OU.     Last A1C: unsure, will have an appt tomorrow  Last CN:OBSJGG 120 this AM.        Last edited by Nichola Sizer D on 01/23/2020  1:36 PM. (History)      Referring physician: [name], PA-C [address] [place],  [place] [address]  HISTORICAL INFORMATION:   Selected notes from the MEDICAL RECORD NUMBER    Lab Results  Component Value Date   HGBA1C 6.4 (H) 07/23/2019     CURRENT MEDICATIONS: No current outpatient medications on file. (Ophthalmic Drugs)   No current facility-administered medications for this visit. (Ophthalmic Drugs)   Current Outpatient Medications (Other)  Medication Sig  . amLODipine (NORVASC) 10 MG tablet Take 1 tablet (10 mg total) by mouth daily.  Marland Kitchen atorvastatin (LIPITOR) 40 MG tablet Take 1 tablet (40 mg total) by mouth daily.  . blood glucose meter kit and supplies KIT Dispense based on patient and insurance preference. Use up to four times daily as directed. (FOR ICD-9 250.00, 250.01).  . Glucose Blood (BLOOD GLUCOSE TEST STRIPS) STRP Use as directed to monitor FSBS once daily, for ICD 10 E11.65  . lisinopril-hydrochlorothiazide (ZESTORETIC) 20-12.5 MG tablet Take 1 tablet by mouth daily.  . metFORMIN (GLUCOPHAGE) 1000 MG tablet TAKE 1 TABLET (1,000 MG TOTAL) BY MOUTH 2 (TWO) TIMES DAILY WITH A  MEAL.  . pantoprazole (PROTONIX) 40 MG tablet TAKE 1 TABLET BY MOUTH EVERY DAY   No current facility-administered medications for this visit. (Other)      REVIEW OF SYSTEMS:    ALLERGIES No Known Allergies  PAST MEDICAL HISTORY Past Medical History:  Diagnosis Date  . Acute renal failure (ARF) ([place])   . AKI (acute kidney injury) ([place]) 09/02/2017  . Anemia due to chronic kidney disease 04/15/2018  . DM (diabetes mellitus) with complications ([place]) 9/47/6546  . Elevated lipase 04/15/2018  . Hyperglycemia without ketosis   . Hyperlipidemia   . Hypertension    Past Surgical History:  Procedure Laterality Date  . COLONOSCOPY WITH PROPOFOL N/A 01/15/2019   Procedure: COLONOSCOPY WITH PROPOFOL;  Surgeon: Jonathon Bellows, MD;  Location: [facility] ENDOSCOPY;  Service: Gastroenterology;  Laterality: N/A;    FAMILY HISTORY Family History  Problem Relation Age of Onset  . Kidney failure Sister   . Cancer Sister   . Diabetes Sister   . Hypertension Sister   . Hyperlipidemia Sister   . Heart disease Sister   . Kidney disease Sister   . Cancer Brother   . CAD Brother   . Diabetes Brother   . Hypertension Brother   . Hyperlipidemia Brother   . Heart disease Brother   . Diabetes Other   . Cancer Mother  SOCIAL HISTORY Social History   Tobacco Use  . Smoking status: Never Smoker  . Smokeless tobacco: Never Used  Vaping Use  . Vaping Use: Never used  Substance Use Topics  . Alcohol use: Never  . Drug use: Never         OPHTHALMIC EXAM: Base Eye Exam    Visual Acuity (ETDRS)      Right Left   Dist [place] 20/20 -1 20/50 -2   Dist ph [place]  20/40       Tonometry (Tonopen, 1:42 PM)      Right Left   Pressure 22 19       Pupils      Pupils Dark Light Shape React APD   Right PERRL 4 3 Round Sluggish None   Left PERRL 4 3 Round Sluggish None       Visual Fields (Counting fingers)      Left Right    Full Full       Extraocular Movement      Right Left    Full Full        Neuro/Psych    Oriented x3: Yes   Mood/Affect: Normal       Dilation    Right eye: 1.0% Mydriacyl, 2.5% Phenylephrine @ 1:42 PM        Slit Lamp and Fundus Exam    External Exam      Right Left   External Normal Normal       Slit Lamp Exam      Right Left   Lids/Lashes Normal Normal   Conjunctiva/Sclera White and quiet White and quiet   Cornea Clear Clear   Anterior Chamber Deep and quiet Deep and quiet   Iris Round and reactive Round and reactive   Lens 3+ Nuclear sclerosis 3+ Nuclear sclerosis   Anterior Vitreous Normal Normal       Fundus Exam      Right Left   Posterior Vitreous Normal    Disc Normal    C/D Ratio 0.5    Macula Microaneurysms, Mild clinically significant macular edema    Vessels NPDR severe,     Periphery Cotton wool spots           IMAGING AND PROCEDURES  Imaging and Procedures for 01/23/20  OCT, Retina - OU - Both Eyes       Right Eye Quality was good. Scan locations included subfoveal. Central Foveal Thickness: 341. Progression has improved. Findings include abnormal foveal contour.   Left Eye Quality was good. Scan locations included subfoveal. Central Foveal Thickness: 405. Progression has worsened. Findings include abnormal foveal contour.   Notes CSME persist superior to the fovea, much less center involvement OD  OS with center involved worsening of CSME will need to commence with antivegF therapy OS       Intravitreal Injection, Pharmacologic Agent - OD - Right Eye       Time Out 01/23/2020. 2:27 PM. Confirmed correct patient, procedure, site, and patient consented.   Anesthesia Topical anesthesia was used. Anesthetic medications included Akten 3.5%.   Procedure Preparation included Ofloxacin , 10% betadine to eyelids, 5% betadine to ocular surface, Tobramycin 0.3%. A 30 gauge needle was used.   Injection:  2.5 mg Bevacizumab (AVASTIN) 2.74m/0.1mL SOSY   NDC: 785462-703-50 Lot:: [id number]  Route: Intravitreal,  Site: Right Eye  Post-op Post injection exam found visual acuity of at least counting fingers. The patient tolerated the procedure well. There were no complications. The patient  received written and verbal post procedure care education. Post injection medications were not given.                 ASSESSMENT/PLAN:  Severe nonproliferative diabetic retinopathy of left eye, with macular edema, associated with type 2 diabetes mellitus (HCC) OS, center involved CSME much worse off therapy will need to resume Therapy promptly  Severe nonproliferative diabetic retinopathy of right eye, with macular edema, associated with type 2 diabetes mellitus ([place]) CSME much improved, will repeat injection Avastin today at 6-week follow-up.      ICD-10-CM   1. Severe nonproliferative diabetic retinopathy of right eye, with macular edema, associated with type 2 diabetes mellitus (HCC)  E11.3411 OCT, Retina - OU - Both Eyes    Intravitreal Injection, Pharmacologic Agent - OD - Right Eye    bevacizumab (AVASTIN) SOSY 2.5 mg  2. Severe nonproliferative diabetic retinopathy of left eye, with macular edema, associated with type 2 diabetes mellitus ([place])  N02.7253     1.  OD, center involved CSME much improved.  Post injection Avastin at 6-week interval.  We will repeat injection today and examination again OD in 6 weeks  2.  Center involved CSME OS is now worsened.  Status post Avastin July 2021, focal laser 2021 now with recurrence of disease.  3.  Follow-up OS within 1 week.  Ophthalmic Meds Ordered this visit:  Meds ordered this encounter  Medications  . bevacizumab (AVASTIN) SOSY 2.5 mg       Return in about 1 week (around 01/30/2020) for dilate, COLOR FP, OS, AVASTIN OCT.  There are no Patient Instructions on file for this visit.   Explained the diagnoses, plan, and follow up with the patient and they expressed understanding.  Patient expressed understanding of the importance of proper follow  up care.   Clent Demark Porfirio Bollier M.D. Diseases & Surgery of the Retina and Vitreous Retina & Diabetic [place] 01/23/20     Abbreviations: M myopia (nearsighted); A astigmatism; H hyperopia (farsighted); P presbyopia; Mrx spectacle prescription;  CTL contact lenses; OD right eye; OS left eye; OU both eyes  XT exotropia; ET esotropia; PEK punctate epithelial keratitis; PEE punctate epithelial erosions; DES dry eye syndrome; MGD meibomian gland dysfunction; ATs artificial tears; PFAT's preservative free artificial tears; [place] nuclear sclerotic cataract; PSC posterior subcapsular cataract; ERM epi-retinal membrane; PVD posterior vitreous detachment; RD retinal detachment; DM diabetes mellitus; DR diabetic retinopathy; NPDR non-proliferative diabetic retinopathy; PDR proliferative diabetic retinopathy; CSME clinically significant macular edema; DME diabetic macular edema; dbh dot blot hemorrhages; CWS cotton wool spot; POAG primary open angle glaucoma; C/D cup-to-disc ratio; HVF humphrey visual field; GVF goldmann visual field; OCT optical coherence tomography; IOP intraocular pressure; BRVO Branch retinal vein occlusion; CRVO central retinal vein occlusion; CRAO central retinal artery occlusion; BRAO branch retinal artery occlusion; RT retinal tear; SB scleral buckle; PPV pars plana vitrectomy; VH Vitreous hemorrhage; PRP panretinal laser photocoagulation; IVK intravitreal kenalog; VMT vitreomacular traction; MH Macular hole;  NVD neovascularization of the disc; NVE neovascularization elsewhere; AREDS age related eye disease study; ARMD age related macular degeneration; POAG primary open angle glaucoma; EBMD epithelial/anterior basement membrane dystrophy; ACIOL anterior chamber intraocular lens; IOL intraocular lens; PCIOL posterior chamber intraocular lens; Phaco/IOL phacoemulsification with intraocular lens placement; [place] photorefractive keratectomy; LASIK laser assisted in situ keratomileusis; HTN  hypertension; DM diabetes mellitus; COPD chronic obstructive pulmonary disease

## 2020-01-24 ENCOUNTER — Ambulatory Visit: Payer: Medicare Other | Admitting: Family Medicine

## 2020-01-31 ENCOUNTER — Ambulatory Visit (INDEPENDENT_AMBULATORY_CARE_PROVIDER_SITE_OTHER): Payer: Medicare Other | Admitting: Ophthalmology

## 2020-01-31 ENCOUNTER — Other Ambulatory Visit: Payer: Self-pay

## 2020-01-31 ENCOUNTER — Encounter (INDEPENDENT_AMBULATORY_CARE_PROVIDER_SITE_OTHER): Payer: Self-pay | Admitting: Ophthalmology

## 2020-01-31 DIAGNOSIS — E113412 Type 2 diabetes mellitus with severe nonproliferative diabetic retinopathy with macular edema, left eye: Secondary | ICD-10-CM | POA: Diagnosis not present

## 2020-01-31 DIAGNOSIS — E113411 Type 2 diabetes mellitus with severe nonproliferative diabetic retinopathy with macular edema, right eye: Secondary | ICD-10-CM

## 2020-01-31 MED ORDER — BEVACIZUMAB 2.5 MG/0.1ML IZ SOSY
2.5000 mg | PREFILLED_SYRINGE | INTRAVITREAL | Status: AC | PRN
  Administered 2020-01-31: 2.5 mg via INTRAVITREAL

## 2020-01-31 NOTE — Assessment & Plan Note (Signed)
Commence with intravitreal Avastin OS today and examination again in 6 weeks left eye

## 2020-01-31 NOTE — Progress Notes (Signed)
01/31/2020     CHIEF COMPLAINT Patient presents for Retina Follow Up (8 WK FU OS, POSS AVASTIN  OS ///Pt reports stable vision OS. Pt denies any new F/F, pain, or pressure OS. ////Last BS: 160 a few days ago. )   HISTORY OF PRESENT ILLNESS: Cameron Glover is a 70 y.o. male who presents to the clinic today for:   HPI    Retina Follow Up    Patient presents with  Diabetic Retinopathy.  In left eye.  This started 8 weeks ago.  Duration of 8 weeks.  Since onset it is stable. Additional comments: 8 WK FU OS, POSS AVASTIN  OS    Pt reports stable vision OS. Pt denies any new F/F, pain, or pressure OS.     Last BS: 160 a few days ago.        Last edited by Nichola Sizer D on 01/31/2020  2:49 PM. (History)      Referring physician: [name], PA-C [address] [place],  [place] [address]  HISTORICAL INFORMATION:   Selected notes from the MEDICAL RECORD NUMBER    Lab Results  Component Value Date   HGBA1C 6.4 (H) 07/23/2019     CURRENT MEDICATIONS: No current outpatient medications on file. (Ophthalmic Drugs)   No current facility-administered medications for this visit. (Ophthalmic Drugs)   Current Outpatient Medications (Other)  Medication Sig  . amLODipine (NORVASC) 10 MG tablet Take 1 tablet (10 mg total) by mouth daily.  Marland Kitchen atorvastatin (LIPITOR) 40 MG tablet Take 1 tablet (40 mg total) by mouth daily.  . blood glucose meter kit and supplies KIT Dispense based on patient and insurance preference. Use up to four times daily as directed. (FOR ICD-9 250.00, 250.01).  . Glucose Blood (BLOOD GLUCOSE TEST STRIPS) STRP Use as directed to monitor FSBS once daily, for ICD 10 E11.65  . lisinopril-hydrochlorothiazide (ZESTORETIC) 20-12.5 MG tablet Take 1 tablet by mouth daily.  . metFORMIN (GLUCOPHAGE) 1000 MG tablet TAKE 1 TABLET (1,000 MG TOTAL) BY MOUTH 2 (TWO) TIMES DAILY WITH A MEAL.  . pantoprazole (PROTONIX) 40 MG tablet TAKE 1 TABLET BY MOUTH EVERY DAY    No current facility-administered medications for this visit. (Other)      REVIEW OF SYSTEMS:    ALLERGIES No Known Allergies  PAST MEDICAL HISTORY Past Medical History:  Diagnosis Date  . Acute renal failure (ARF) ([place])   . AKI (acute kidney injury) ([place]) 09/02/2017  . Anemia due to chronic kidney disease 04/15/2018  . DM (diabetes mellitus) with complications ([place]) 8/33/8250  . Elevated lipase 04/15/2018  . Hyperglycemia without ketosis   . Hyperlipidemia   . Hypertension    Past Surgical History:  Procedure Laterality Date  . COLONOSCOPY WITH PROPOFOL N/A 01/15/2019   Procedure: COLONOSCOPY WITH PROPOFOL;  Surgeon: Jonathon Bellows, MD;  Location: [facility] ENDOSCOPY;  Service: Gastroenterology;  Laterality: N/A;    FAMILY HISTORY Family History  Problem Relation Age of Onset  . Kidney failure Sister   . Cancer Sister   . Diabetes Sister   . Hypertension Sister   . Hyperlipidemia Sister   . Heart disease Sister   . Kidney disease Sister   . Cancer Brother   . CAD Brother   . Diabetes Brother   . Hypertension Brother   . Hyperlipidemia Brother   . Heart disease Brother   . Diabetes Other   . Cancer Mother     SOCIAL HISTORY Social History   Tobacco Use  .  Smoking status: Never Smoker  . Smokeless tobacco: Never Used  Vaping Use  . Vaping Use: Never used  Substance Use Topics  . Alcohol use: Never  . Drug use: Never         OPHTHALMIC EXAM: Base Eye Exam    Visual Acuity (ETDRS)      Right Left   Dist [place] 20/20 -2 20/60 -2   Dist ph [place]  20/40 -2       Tonometry (Tonopen, 2:54 PM)      Right Left   Pressure 15 21       Pupils      Pupils Dark Light Shape React APD   Right PERRL 4 3 Round Sluggish None   Left PERRL 4 3 Round Sluggish None       Visual Fields (Counting fingers)      Left Right    Full Full       Extraocular Movement      Right Left    Full Full       Neuro/Psych    Oriented x3: Yes   Mood/Affect: Normal        Dilation    Left eye: 1.0% Mydriacyl, 2.5% Phenylephrine @ 2:54 PM        Slit Lamp and Fundus Exam    External Exam      Right Left   External Normal Normal       Slit Lamp Exam      Right Left   Lids/Lashes Normal Normal   Conjunctiva/Sclera White and quiet White and quiet   Cornea Clear Clear   Anterior Chamber Deep and quiet Deep and quiet   Iris Round and reactive Round and reactive   Lens 3+ Nuclear sclerosis 3+ Nuclear sclerosis   Anterior Vitreous Normal Normal       Fundus Exam      Right Left   Posterior Vitreous  Posterior vitreous detachment   Disc  Normal   C/D Ratio  0.5   Macula  Microaneurysms, , Mild clinically significant macular edema   Vessels  NPDR severe   Periphery  Cotton wool spots          IMAGING AND PROCEDURES  Imaging and Procedures for 01/31/20  OCT, Retina - OU - Both Eyes       Right Eye Quality was good. Scan locations included subfoveal. Central Foveal Thickness: 332. Progression has improved. Findings include abnormal foveal contour.   Left Eye Quality was good. Scan locations included subfoveal. Central Foveal Thickness: 342. Progression has been stable. Findings include abnormal foveal contour.   Notes 1 week post Avastin OD for CSME, improved region of thickening inferiorly, center involvement persists.  OS with center involved CSME and subfoveal small serous retinal detachment, for commencement of therapy today with intravitreal Avastin and follow-up OS again in 5 to 6 weeks       Color Fundus Photography Optos - OU - Both Eyes       Right Eye Progression has been stable. Macula : edema, microaneurysms.   Left Eye Progression has been stable. Macula : edema, exudates.   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       Intravitreal Injection, Pharmacologic Agent - OS - Left Eye       Time Out 01/31/2020. 3:48 PM. Confirmed correct  patient, procedure, site, and patient consented.   Anesthesia Topical anesthesia was used. Anesthetic medications included Akten 3.5%.   Procedure  Preparation included 10% betadine to eyelids, 5% betadine to ocular surface, Ofloxacin . A 30 gauge needle was used.   Injection:  2.5 mg Bevacizumab (AVASTIN) 2.5mg /0.58mL SOSY   NDC: 50932-671-24, Lot: [id number]   Route: Intravitreal, Site: Left Eye  Post-op Post injection exam found visual acuity of at least counting fingers. The patient tolerated the procedure well. There were no complications. The patient received written and verbal post procedure care education. Post injection medications were not given.                 ASSESSMENT/PLAN:  Severe nonproliferative diabetic retinopathy of left eye, with macular edema, associated with type 2 diabetes mellitus ([place]) Commence with intravitreal Avastin OS today and examination again in 6 weeks left eye  Severe nonproliferative diabetic retinopathy of right eye, with macular edema, associated with type 2 diabetes mellitus (HCC) Improved CSME OD today 1 week post recent injection      ICD-10-CM   1. Severe nonproliferative diabetic retinopathy of left eye, with macular edema, associated with type 2 diabetes mellitus (HCC)  J82.5053 OCT, Retina - OU - Both Eyes    Color Fundus Photography Optos - OU - Both Eyes    Intravitreal Injection, Pharmacologic Agent - OS - Left Eye    bevacizumab (AVASTIN) SOSY 2.5 mg  2. Severe nonproliferative diabetic retinopathy of right eye, with macular edema, associated with type 2 diabetes mellitus ([place])  E11.3411     1.  OD, 1 week post injection Avastin, with improved macular features and acuity, less CSME  2.  OS with center involved CSME to commence intravitreal Avastin today  3.  Dilate OD next as scheduled  Ophthalmic Meds Ordered this visit:  Meds ordered this encounter  Medications  . bevacizumab (AVASTIN) SOSY 2.5 mg       No  follow-ups on file.  There are no Patient Instructions on file for this visit.   Explained the diagnoses, plan, and follow up with the patient and they expressed understanding.  Patient expressed understanding of the importance of proper follow up care.   Clent Demark Corliss Lamartina M.D. Diseases & Surgery of the Retina and Vitreous Retina & Diabetic [place] 01/31/20     Abbreviations: M myopia (nearsighted); A astigmatism; H hyperopia (farsighted); P presbyopia; Mrx spectacle prescription;  CTL contact lenses; OD right eye; OS left eye; OU both eyes  XT exotropia; ET esotropia; PEK punctate epithelial keratitis; PEE punctate epithelial erosions; DES dry eye syndrome; MGD meibomian gland dysfunction; ATs artificial tears; PFAT's preservative free artificial tears; [place] nuclear sclerotic cataract; PSC posterior subcapsular cataract; ERM epi-retinal membrane; PVD posterior vitreous detachment; RD retinal detachment; DM diabetes mellitus; DR diabetic retinopathy; NPDR non-proliferative diabetic retinopathy; PDR proliferative diabetic retinopathy; CSME clinically significant macular edema; DME diabetic macular edema; dbh dot blot hemorrhages; CWS cotton wool spot; POAG primary open angle glaucoma; C/D cup-to-disc ratio; HVF humphrey visual field; GVF goldmann visual field; OCT optical coherence tomography; IOP intraocular pressure; BRVO Branch retinal vein occlusion; CRVO central retinal vein occlusion; CRAO central retinal artery occlusion; BRAO branch retinal artery occlusion; RT retinal tear; SB scleral buckle; PPV pars plana vitrectomy; VH Vitreous hemorrhage; PRP panretinal laser photocoagulation; IVK intravitreal kenalog; VMT vitreomacular traction; MH Macular hole;  NVD neovascularization of the disc; NVE neovascularization elsewhere; AREDS age related eye disease study; ARMD age related macular degeneration; POAG primary open angle glaucoma; EBMD epithelial/anterior basement membrane dystrophy; ACIOL  anterior chamber intraocular lens; IOL intraocular lens; PCIOL posterior chamber intraocular lens; Phaco/IOL phacoemulsification  with intraocular lens placement; [place] photorefractive keratectomy; LASIK laser assisted in situ keratomileusis; HTN hypertension; DM diabetes mellitus; COPD chronic obstructive pulmonary disease

## 2020-01-31 NOTE — Assessment & Plan Note (Signed)
Improved CSME OD today 1 week post recent injection

## 2020-02-11 ENCOUNTER — Other Ambulatory Visit: Payer: Self-pay

## 2020-02-11 ENCOUNTER — Encounter: Payer: Self-pay | Admitting: Family Medicine

## 2020-02-11 ENCOUNTER — Ambulatory Visit (INDEPENDENT_AMBULATORY_CARE_PROVIDER_SITE_OTHER): Payer: Medicare Other | Admitting: Family Medicine

## 2020-02-11 VITALS — BP 132/80 | HR 96 | Temp 98.6°F | Resp 16 | Ht 64.0 in | Wt 177.3 lb

## 2020-02-11 DIAGNOSIS — E785 Hyperlipidemia, unspecified: Secondary | ICD-10-CM

## 2020-02-11 DIAGNOSIS — K219 Gastro-esophageal reflux disease without esophagitis: Secondary | ICD-10-CM

## 2020-02-11 DIAGNOSIS — E113412 Type 2 diabetes mellitus with severe nonproliferative diabetic retinopathy with macular edema, left eye: Secondary | ICD-10-CM

## 2020-02-11 DIAGNOSIS — N182 Chronic kidney disease, stage 2 (mild): Secondary | ICD-10-CM

## 2020-02-11 DIAGNOSIS — I517 Cardiomegaly: Secondary | ICD-10-CM

## 2020-02-11 DIAGNOSIS — I10 Essential (primary) hypertension: Secondary | ICD-10-CM

## 2020-02-11 DIAGNOSIS — E1129 Type 2 diabetes mellitus with other diabetic kidney complication: Secondary | ICD-10-CM | POA: Diagnosis not present

## 2020-02-11 DIAGNOSIS — Z5181 Encounter for therapeutic drug level monitoring: Secondary | ICD-10-CM | POA: Diagnosis not present

## 2020-02-11 DIAGNOSIS — E1169 Type 2 diabetes mellitus with other specified complication: Secondary | ICD-10-CM | POA: Diagnosis not present

## 2020-02-11 DIAGNOSIS — E1122 Type 2 diabetes mellitus with diabetic chronic kidney disease: Secondary | ICD-10-CM

## 2020-02-11 DIAGNOSIS — R809 Proteinuria, unspecified: Secondary | ICD-10-CM

## 2020-02-11 NOTE — Progress Notes (Signed)
Name: Cameron Glover   MRN: [id number]    DOB: 1950-05-19   Date:02/11/2020       Progress Note  Chief Complaint  Patient presents with  . Follow-up  . Hypertension  . Hyperlipidemia  . Diabetes     Subjective:   Cameron Glover is a 71 y.o. male, presents to clinic for routine f/up  Hypertension:  Currently managed on lisinopril HCTZ amlodipine Pt reports good med compliance and denies any SE.   Blood pressure today is well controlled. BP Readings from Last 3 Encounters:  02/11/20 132/80  07/23/19 124/78  03/23/19 138/82   Pt denies CP, SOB, exertional sx, LE edema, palpitation, Ha's, visual disturbances, lightheadedness, hypotension, syncope. Dietary efforts for BP?  Trying to be healthy    Hyperlipidemia: Currently treated with lipitor 40 mg, pt reports good med compliance Last Lipids: Lab Results  Component Value Date   CHOL 89 02/12/2019   HDL 34 (L) 02/12/2019   LDLCALC 38 02/12/2019   TRIG 86 02/12/2019   CHOLHDL 2.6 02/12/2019   - Denies: Chest pain, shortness of breath, myalgias, claudication  DM:   Pt managing DM with metformin  Reports good med compliance Pt has no SE from meds. Blood sugars low 100's when he is able to check Denies: Polyuria, polydipsia, vision changes, neuropathy, hypoglycemia Recent pertinent labs: Lab Results  Component Value Date   HGBA1C 6.4 (H) 07/23/2019   HGBA1C 6.9 (H) 02/12/2019   HGBA1C 7.7 (A) 09/22/2018   Standard of care and health maintenance: Foot exam:  done DM eye exam:  Done - he goes to eye specialists several times a year ACEI/ARB:  yes Statin:  Yes   GERD:   On protonix - hgad epigastric pain - improved with PPI     Current Outpatient Medications:  .  amLODipine (NORVASC) 10 MG tablet, Take 1 tablet (10 mg total) by mouth daily., Disp: 90 tablet, Rfl: 3 .  atorvastatin (LIPITOR) 40 MG tablet, Take 1 tablet (40 mg total) by mouth daily., Disp: 90 tablet, Rfl: 3 .  blood glucose meter kit and supplies  KIT, Dispense based on patient and insurance preference. Use up to four times daily as directed. (FOR ICD-9 250.00, 250.01)., Disp: 1 each, Rfl: 0 .  Glucose Blood (BLOOD GLUCOSE TEST STRIPS) STRP, Use as directed to monitor FSBS once daily, for ICD 10 E11.65, Disp: 100 strip, Rfl: 3 .  lisinopril-hydrochlorothiazide (ZESTORETIC) 20-12.5 MG tablet, Take 1 tablet by mouth daily., Disp: 90 tablet, Rfl: 3 .  metFORMIN (GLUCOPHAGE) 1000 MG tablet, TAKE 1 TABLET (1,000 MG TOTAL) BY MOUTH 2 (TWO) TIMES DAILY WITH A MEAL., Disp: 180 tablet, Rfl: 1 .  pantoprazole (PROTONIX) 40 MG tablet, TAKE 1 TABLET BY MOUTH EVERY DAY, Disp: 90 tablet, Rfl: 3  Patient Active Problem List   Diagnosis Date Noted  . Severe nonproliferative diabetic retinopathy of left eye, with macular edema, associated with type 2 diabetes mellitus ([place]) 05/14/2019  . Severe nonproliferative diabetic retinopathy of right eye, with macular edema, associated with type 2 diabetes mellitus ([place]) 05/14/2019  . Retinal hemorrhage of right eye 05/14/2019  . Retinal hemorrhage of left eye 05/14/2019  . Nuclear sclerotic cataract of right eye 05/14/2019  . Nuclear sclerotic cataract of left eye 05/14/2019  . LVH (left ventricular hypertrophy) 03/23/2019  . Hyperlipidemia associated with type 2 diabetes mellitus ([place]) 09/22/2018  . CKD stage 2 due to type 2 diabetes mellitus ([place]) 09/22/2018  . Uncontrolled type 2 diabetes mellitus with hyperglycemia (  [place]) 04/15/2018  . Essential hypertension 04/15/2018    Past Surgical History:  Procedure Laterality Date  . COLONOSCOPY WITH PROPOFOL N/A 01/15/2019   Procedure: COLONOSCOPY WITH PROPOFOL;  Surgeon: Jonathon Bellows, MD;  Location: [facility] END[facility]COPY;  Service: Gastroenterology;  Laterality: N/A;    Family History  Problem Relation Age of Onset  . Kidney failure Sister   . Cancer Sister   . Diabetes Sister   . Hypertension Sister   . Hyperlipidemia Sister   . Heart disease Sister   . Kidney  disease Sister   . Cancer Brother   . CAD Brother   . Diabetes Brother   . Hypertension Brother   . Hyperlipidemia Brother   . Heart disease Brother   . Diabetes Other   . Cancer Mother     Social History   Tobacco Use  . Smoking status: Never Smoker  . Smokeless tobacco: Never Used  Vaping Use  . Vaping Use: Never used  Substance Use Topics  . Alcohol use: Never  . Drug use: Never     No Known Allergies  Health Maintenance  Topic Date Due  . HEMOGLOBIN A1C  01/23/2020  . OPHTHALMOLOGY EXAM  02/03/2020  . TETANUS/TDAP  02/28/2020 (Originally 11/14/1969)  . INFLUENZA VACCINE  04/03/2020 (Originally 08/05/2019)  . FOOT EXAM  07/22/2020  . PNA vac Low Risk Adult (2 of 2 - PPSV23) 09/04/2022  . COLONOSCOPY (Pts 45-33yr Insurance coverage will need to be confirmed)  01/15/2024  . COVID-19 Vaccine  Completed  . Hepatitis C Screening  Completed    Chart Review Today: I personally reviewed active problem list, medication list, allergies, family history, social history, health maintenance, notes from last encounter, lab results, imaging with the patient/caregiver today.   Review of Systems  Constitutional: Negative.   HENT: Negative.   Eyes: Negative.   Respiratory: Negative.   Cardiovascular: Negative.   Gastrointestinal: Negative.   Endocrine: Negative.   Genitourinary: Negative.   Musculoskeletal: Negative.   Skin: Negative.   Allergic/Immunologic: Negative.   Neurological: Negative.   Hematological: Negative.   Psychiatric/Behavioral: Negative.   All other systems reviewed and are negative.    Objective:   Vitals:   02/11/20 1054  BP: 132/80  Pulse: 96  Resp: 16  Temp: 98.6 F (37 C)  SpO2: 98%  Weight: 177 lb 4.8 oz (80.4 kg)  Height: '5\' 4"'  (1.626 m)    Body mass index is 30.43 kg/m.  Physical Exam Vitals and nursing note reviewed.  Constitutional:      General: He is not in acute distress.    Appearance: Normal appearance. He is  well-developed. He is not ill-appearing, toxic-appearing or diaphoretic.     Interventions: Face mask in place.  HENT:     Head: Normocephalic and atraumatic.     Jaw: No trismus.     Right Ear: External ear normal.     Left Ear: External ear normal.  Eyes:     General: Lids are normal. No scleral icterus.       Right eye: No discharge.        Left eye: No discharge.     Conjunctiva/sclera: Conjunctivae normal.  Neck:     Trachea: Trachea and phonation normal. No tracheal deviation.  Cardiovascular:     Rate and Rhythm: Normal rate and regular rhythm.     Pulses: Normal pulses.          Radial pulses are 2+ on the right side and 2+ on the  left side.       Posterior tibial pulses are 2+ on the right side and 2+ on the left side.     Heart sounds: Normal heart sounds. No murmur heard. No friction rub. No gallop.   Pulmonary:     Effort: Pulmonary effort is normal. No respiratory distress.     Breath sounds: Normal breath sounds. No stridor. No wheezing, rhonchi or rales.  Abdominal:     General: Bowel sounds are normal. There is no distension.     Palpations: Abdomen is soft.  Musculoskeletal:     Right lower leg: No edema.     Left lower leg: No edema.  Skin:    General: Skin is warm and dry.     Coloration: Skin is not jaundiced.     Findings: No rash.     Nails: There is no clubbing.  Neurological:     Mental Status: He is alert. Mental status is at baseline.     Cranial Nerves: No dysarthria or facial asymmetry.     Motor: No tremor or abnormal muscle tone.     Gait: Gait normal.  Psychiatric:        Mood and Affect: Mood normal.        Speech: Speech normal.        Behavior: Behavior normal. Behavior is cooperative.         Assessment & Plan:   1. Controlled type 2 diabetes mellitus with microalbuminuria, unspecified whether long term insulin use (HCC) Doing well, improved and well controlled with metformin and diet changes Due for labs On ACEI, statin, ASA,  and utd on eye exam and foot exam - COMPLETE METABOLIC PANEL WITH GFR - Lipid panel - Hemoglobin A1C  2. Essential hypertension Stable, well controlled, BP at goal today Continue lisinoptirl HCTZ and amlodipine - COMPLETE METABOLIC PANEL WITH GFR  3. Hyperlipidemia associated with type 2 diabetes mellitus ([place]) Compliant with meds, no SE, no myalgias, fatigue or jaundice Diet and exercise recommendations for HLD reviewed - COMPLETE METABOLIC PANEL WITH GFR - Lipid panel  4. Encounter for medication monitoring - COMPLETE METABOLIC PANEL WITH GFR - Lipid panel - Hemoglobin A1C - CBC with Differential/Platelet  5. LVH (left ventricular hypertrophy) DOE stable - he has consulted with specialists - no signs of fluid overload today, BP well controlled  6. CKD stage 2 due to type 2 diabetes mellitus ([place]) Continue monitoring - COMPLETE METABOLIC PANEL WITH GFR  7. Severe nonproliferative diabetic retinopathy of left eye, with macular edema, associated with type 2 diabetes mellitus ([place]) Managed by ophtho  8. Gastroesophageal reflux disease, unspecified whether esophagitis present Sx well controlled with diet changes and PPI  6 month f/up  [name], PA-C 02/11/20 11:16 AM

## 2020-02-12 LAB — CBC WITH DIFFERENTIAL/PLATELET
Absolute Monocytes: 396 cells/uL (ref 200–950)
Basophils Absolute: 39 cells/uL (ref 0–200)
Basophils Relative: 0.7 %
Eosinophils Absolute: 198 cells/uL (ref 15–500)
Eosinophils Relative: 3.6 %
HCT: 31.9 % — ABNORMAL LOW (ref 38.5–50.0)
Hemoglobin: 10.5 g/dL — ABNORMAL LOW (ref 13.2–17.1)
Lymphs Abs: 1529 cells/uL (ref 850–3900)
MCH: 30.7 pg (ref 27.0–33.0)
MCHC: 32.9 g/dL (ref 32.0–36.0)
MCV: 93.3 fL (ref 80.0–100.0)
MPV: 10.6 fL (ref 7.5–12.5)
Monocytes Relative: 7.2 %
Neutro Abs: 3339 cells/uL (ref 1500–7800)
Neutrophils Relative %: 60.7 %
Platelets: 275 10*3/uL (ref 140–400)
RBC: 3.42 10*6/uL — ABNORMAL LOW (ref 4.20–5.80)
RDW: 12.5 % (ref 11.0–15.0)
Total Lymphocyte: 27.8 %
WBC: 5.5 10*3/uL (ref 3.8–10.8)

## 2020-02-12 LAB — COMPLETE METABOLIC PANEL WITH GFR
AG Ratio: 1.2 (calc) (ref 1.0–2.5)
ALT: 16 U/L (ref 9–46)
AST: 13 U/L (ref 10–35)
Albumin: 3.6 g/dL (ref 3.6–5.1)
Alkaline phosphatase (APISO): 86 U/L (ref 35–144)
BUN/Creatinine Ratio: 13 (calc) (ref 6–22)
BUN: 18 mg/dL (ref 7–25)
CO2: 26 mmol/L (ref 20–32)
Calcium: 7.7 mg/dL — ABNORMAL LOW (ref 8.6–10.3)
Chloride: 107 mmol/L (ref 98–110)
Creat: 1.43 mg/dL — ABNORMAL HIGH (ref 0.70–1.25)
GFR, Est African American: 58 mL/min/{1.73_m2} — ABNORMAL LOW (ref >=60)
GFR, Est Non African American: 50 mL/min/{1.73_m2} — ABNORMAL LOW (ref >=60)
Globulin: 2.9 g/dL (calc) (ref 1.9–3.7)
Glucose, Bld: 135 mg/dL — ABNORMAL HIGH (ref 65–99)
Potassium: 4.3 mmol/L (ref 3.5–5.3)
Sodium: 142 mmol/L (ref 135–146)
Total Bilirubin: 0.4 mg/dL (ref 0.2–1.2)
Total Protein: 6.5 g/dL (ref 6.1–8.1)

## 2020-02-12 LAB — LIPID PANEL
Cholesterol: 103 mg/dL (ref <200)
HDL: 35 mg/dL — ABNORMAL LOW (ref >=40)
LDL Cholesterol (Calc): 53 mg/dL (calc)
Non-HDL Cholesterol (Calc): 68 mg/dL (calc) (ref <130)
Total CHOL/HDL Ratio: 2.9 (calc) (ref <5.0)
Triglycerides: 73 mg/dL (ref <150)

## 2020-02-12 LAB — HEMOGLOBIN A1C
Hgb A1c MFr Bld: 6.2 % of total Hgb — ABNORMAL HIGH (ref <5.7)
Mean Plasma Glucose: 131 mg/dL
eAG (mmol/L): 7.3 mmol/L

## 2020-03-06 ENCOUNTER — Other Ambulatory Visit: Payer: Self-pay

## 2020-03-06 ENCOUNTER — Ambulatory Visit (INDEPENDENT_AMBULATORY_CARE_PROVIDER_SITE_OTHER): Payer: Medicare Other | Admitting: Ophthalmology

## 2020-03-06 ENCOUNTER — Encounter (INDEPENDENT_AMBULATORY_CARE_PROVIDER_SITE_OTHER): Payer: Self-pay | Admitting: Ophthalmology

## 2020-03-06 DIAGNOSIS — H43823 Vitreomacular adhesion, bilateral: Secondary | ICD-10-CM | POA: Diagnosis not present

## 2020-03-06 DIAGNOSIS — E113411 Type 2 diabetes mellitus with severe nonproliferative diabetic retinopathy with macular edema, right eye: Secondary | ICD-10-CM

## 2020-03-06 DIAGNOSIS — H2511 Age-related nuclear cataract, right eye: Secondary | ICD-10-CM

## 2020-03-06 DIAGNOSIS — E113412 Type 2 diabetes mellitus with severe nonproliferative diabetic retinopathy with macular edema, left eye: Secondary | ICD-10-CM | POA: Diagnosis not present

## 2020-03-06 MED ORDER — BEVACIZUMAB 2.5 MG/0.1ML IZ SOSY
2.5000 mg | PREFILLED_SYRINGE | INTRAVITREAL | Status: AC | PRN
  Administered 2020-03-06: 2.5 mg via INTRAVITREAL

## 2020-03-06 NOTE — Assessment & Plan Note (Signed)
OD much improved on therapy, currently 5-week interval.  We will repeat injection today and examination again in 5 weeks

## 2020-03-06 NOTE — Progress Notes (Signed)
03/06/2020     CHIEF COMPLAINT Patient presents for Retina Follow Up (6 Week F/U OD, poss Avastin OD//Pt denies noticeable changes to [place] OU since last visit. Pt denies ocular pain, flashes of light, or floaters OU. //LBS: 130 yesterday)   HISTORY OF PRESENT ILLNESS: Cameron Glover is a 70 y.o. male who presents to the clinic today for:   HPI    Retina Follow Up    Patient presents with  Diabetic Retinopathy.  In right eye.  This started 6 weeks ago.  Severity is mild.  Duration of 6 weeks.  Since onset it is stable. Additional comments: 6 Week F/U OD, poss Avastin OD  Pt denies noticeable changes to [place] OU since last visit. Pt denies ocular pain, flashes of light, or floaters OU.   LBS: 130 yesterday       Last edited by Rockie Neighbours, [place] on 03/06/2020  1:35 PM. (History)      Referring physician: [name], PA-C [address] [place],  [place] [address]  HISTORICAL INFORMATION:   Selected notes from the MEDICAL RECORD NUMBER    Lab Results  Component Value Date   HGBA1C 6.2 (H) 02/11/2020     CURRENT MEDICATIONS: No current outpatient medications on file. (Ophthalmic Drugs)   No current facility-administered medications for this visit. (Ophthalmic Drugs)   Current Outpatient Medications (Other)  Medication Sig  . amLODipine (NORVASC) 10 MG tablet Take 1 tablet (10 mg total) by mouth daily.  Marland Kitchen atorvastatin (LIPITOR) 40 MG tablet Take 1 tablet (40 mg total) by mouth daily.  . blood glucose meter kit and supplies KIT Dispense based on patient and insurance preference. Use up to four times daily as directed. (FOR ICD-9 250.00, 250.01).  . Glucose Blood (BLOOD GLUCOSE TEST STRIPS) STRP Use as directed to monitor FSBS once daily, for ICD 10 E11.65  . lisinopril-hydrochlorothiazide (ZESTORETIC) 20-12.5 MG tablet Take 1 tablet by mouth daily.  . metFORMIN (GLUCOPHAGE) 1000 MG tablet TAKE 1 TABLET (1,000 MG TOTAL) BY MOUTH 2 (TWO) TIMES DAILY WITH A MEAL.  .  pantoprazole (PROTONIX) 40 MG tablet TAKE 1 TABLET BY MOUTH EVERY DAY   No current facility-administered medications for this visit. (Other)      REVIEW OF SYSTEMS:    ALLERGIES No Known Allergies  PAST MEDICAL HISTORY Past Medical History:  Diagnosis Date  . Acute renal failure (ARF) ([place])   . AKI (acute kidney injury) ([place]) 09/02/2017  . Anemia due to chronic kidney disease 04/15/2018  . DM (diabetes mellitus) with complications ([place]) 02/26/9796  . Elevated lipase 04/15/2018  . Hyperglycemia without ketosis   . Hyperlipidemia   . Hypertension    Past Surgical History:  Procedure Laterality Date  . COLONOSCOPY WITH PROPOFOL N/A 01/15/2019   Procedure: COLONOSCOPY WITH PROPOFOL;  Surgeon: Jonathon Bellows, MD;  Location: [facility] ENDOSCOPY;  Service: Gastroenterology;  Laterality: N/A;    FAMILY HISTORY Family History  Problem Relation Age of Onset  . Kidney failure Sister   . Cancer Sister   . Diabetes Sister   . Hypertension Sister   . Hyperlipidemia Sister   . Heart disease Sister   . Kidney disease Sister   . Cancer Brother   . CAD Brother   . Diabetes Brother   . Hypertension Brother   . Hyperlipidemia Brother   . Heart disease Brother   . Diabetes Other   . Cancer Mother     SOCIAL HISTORY Social History   Tobacco Use  .  Smoking status: Never Smoker  . Smokeless tobacco: Never Used  Vaping Use  . Vaping Use: Never used  Substance Use Topics  . Alcohol use: Never  . Drug use: Never         OPHTHALMIC EXAM:  Base Eye Exam    Visual Acuity (ETDRS)      Right Left   Dist [place] 20/20 -1 20/70   Dist ph   20/30 -1       Tonometry (Tonopen, 1:36 PM)      Right Left   Pressure 16 19       Pupils      Pupils Dark Light Shape React APD   Right PERRL 5 4 Round Sluggish None   Left PERRL 5 4 Round Sluggish None       Visual Fields (Counting fingers)      Left Right    Full Full       Extraocular Movement      Right Left    Full Full        Neuro/Psych    Oriented x3: Yes   Mood/Affect: Normal       Dilation    Right eye: 1.0% Mydriacyl, 2.5% Phenylephrine @ 1:40 PM        Slit Lamp and Fundus Exam    External Exam      Right Left   External Normal Normal       Slit Lamp Exam      Right Left   Lids/Lashes Normal Normal   Conjunctiva/Sclera White and quiet White and quiet   Cornea Clear Clear   Anterior Chamber Deep and quiet Deep and quiet   Iris Round and reactive Round and reactive   Lens 2.5+ Nuclear sclerosis 3+ Nuclear sclerosis   Anterior Vitreous Normal Normal       Fundus Exam      Right Left   Posterior Vitreous Normal    Disc Normal    C/D Ratio 0.5    Macula Microaneurysms, Mild clinically significant macular edema    Vessels NPDR severe,     Periphery Cotton wool spots            IMAGING AND PROCEDURES  Imaging and Procedures for 03/06/20  OCT, Retina - OU - Both Eyes       Right Eye Quality was good. Scan locations included subfoveal. Central Foveal Thickness: 321. Progression has improved. Findings include abnormal foveal contour.   Left Eye Quality was good. Scan locations included subfoveal. Central Foveal Thickness: 305. Progression has been stable. Findings include abnormal foveal contour.   Notes 5 week post Avastin OD for CSME, improved region of thickening inferiorly, center involvement persists.  OS with center involved CSME and subfoveal small serous retinal detachment less active, after most recent intravitreal Avastin and follow-up OS again in 1 weeks       Intravitreal Injection, Pharmacologic Agent - OD - Right Eye       Time Out 03/06/2020. 2:38 PM. Confirmed correct patient, procedure, site, and patient consented.   Anesthesia Topical anesthesia was used. Anesthetic medications included [place] 3.5%.   Procedure Preparation included 5% betadine to ocular surface, 10% betadine to eyelids, Tobramycin 0.3%. A 30 gauge needle was used.   Injection:  2.5 mg  Bevacizumab (AVASTIN) 2.68m/0.1mL SOSY   NDC:: 96283-662-94 Lot:: [id number]  Route: Intravitreal, Site: Right Eye  Post-op Post injection exam found visual acuity of at least counting fingers. The patient tolerated the procedure well. There  were no complications. The patient received written and verbal post procedure care education. Post injection medications were not given.                 ASSESSMENT/PLAN:  Severe nonproliferative diabetic retinopathy of right eye, with macular edema, associated with type 2 diabetes mellitus (HCC) OD much improved on therapy, currently 5-week interval.  We will repeat injection today and examination again in 5 weeks  Nuclear sclerotic cataract of right eye Will likely need cataract surgery in the right eye within the next year, will refer for evaluation  Vitreomacular adhesion of both eyes Physiologic, no impact on contour of the fovea and observe  Severe nonproliferative diabetic retinopathy of left eye, with macular edema, associated with type 2 diabetes mellitus ([place]) Improving by OCT follow-up next week as scheduled      ICD-10-CM   1. Severe nonproliferative diabetic retinopathy of right eye, with macular edema, associated with type 2 diabetes mellitus (HCC)  E11.3411 OCT, Retina - OU - Both Eyes    Intravitreal Injection, Pharmacologic Agent - OD - Right Eye    bevacizumab (AVASTIN) SOSY 2.5 mg  2. Nuclear sclerotic cataract of right eye  H25.11   3. Vitreomacular adhesion of both eyes  H43.823   4. Severe nonproliferative diabetic retinopathy of left eye, with macular edema, associated with type 2 diabetes mellitus ([place])  Q33.3545     1.  2.  3.  Ophthalmic Meds Ordered this visit:  Meds ordered this encounter  Medications  . bevacizumab (AVASTIN) SOSY 2.5 mg       Return in about 5 weeks (around 04/10/2020) for dilate, OD, AVASTIN OCT.  There are no Patient Instructions on file for this visit.   Explained the diagnoses,  plan, and follow up with the patient and they expressed understanding.  Patient expressed understanding of the importance of proper follow up care.   Clent Demark Florinda Taflinger M.D. Diseases & Surgery of the Retina and Vitreous Retina & Diabetic [place] 03/06/20     Abbreviations: M myopia (nearsighted); A astigmatism; H hyperopia (farsighted); P presbyopia; Mrx spectacle prescription;  CTL contact lenses; OD right eye; OS left eye; OU both eyes  XT exotropia; ET esotropia; PEK punctate epithelial keratitis; PEE punctate epithelial erosions; DES dry eye syndrome; MGD meibomian gland dysfunction; ATs artificial tears; PFAT's preservative free artificial tears; [place] nuclear sclerotic cataract; PSC posterior subcapsular cataract; ERM epi-retinal membrane; PVD posterior vitreous detachment; RD retinal detachment; DM diabetes mellitus; DR diabetic retinopathy; NPDR non-proliferative diabetic retinopathy; PDR proliferative diabetic retinopathy; CSME clinically significant macular edema; DME diabetic macular edema; dbh dot blot hemorrhages; CWS cotton wool spot; POAG primary open angle glaucoma; C/D cup-to-disc ratio; HVF humphrey visual field; GVF goldmann visual field; OCT optical coherence tomography; IOP intraocular pressure; BRVO Branch retinal vein occlusion; CRVO central retinal vein occlusion; CRAO central retinal artery occlusion; BRAO branch retinal artery occlusion; RT retinal tear; SB scleral buckle; PPV pars plana vitrectomy; VH Vitreous hemorrhage; PRP panretinal laser photocoagulation; IVK intravitreal kenalog; VMT vitreomacular traction; MH Macular hole;  NVD neovascularization of the disc; NVE neovascularization elsewhere; AREDS age related eye disease study; ARMD age related macular degeneration; POAG primary open angle glaucoma; EBMD epithelial/anterior basement membrane dystrophy; ACIOL anterior chamber intraocular lens; IOL intraocular lens; PCIOL posterior chamber intraocular lens; Phaco/IOL  phacoemulsification with intraocular lens placement; [place] photorefractive keratectomy; LASIK laser assisted in situ keratomileusis; HTN hypertension; DM diabetes mellitus; COPD chronic obstructive pulmonary disease

## 2020-03-06 NOTE — Assessment & Plan Note (Signed)
Will likely need cataract surgery in the right eye within the next year, will refer for evaluation

## 2020-03-06 NOTE — Assessment & Plan Note (Signed)
Improving by OCT follow-up next week as scheduled

## 2020-03-06 NOTE — Assessment & Plan Note (Signed)
Physiologic, no impact on contour of the fovea and observe

## 2020-03-11 ENCOUNTER — Ambulatory Visit: Payer: Self-pay | Admitting: Family Medicine

## 2020-03-13 ENCOUNTER — Ambulatory Visit (INDEPENDENT_AMBULATORY_CARE_PROVIDER_SITE_OTHER): Payer: Medicare Other | Admitting: Ophthalmology

## 2020-03-13 ENCOUNTER — Encounter (INDEPENDENT_AMBULATORY_CARE_PROVIDER_SITE_OTHER): Payer: Self-pay | Admitting: Ophthalmology

## 2020-03-13 ENCOUNTER — Other Ambulatory Visit: Payer: Self-pay

## 2020-03-13 DIAGNOSIS — H2512 Age-related nuclear cataract, left eye: Secondary | ICD-10-CM | POA: Diagnosis not present

## 2020-03-13 DIAGNOSIS — E113411 Type 2 diabetes mellitus with severe nonproliferative diabetic retinopathy with macular edema, right eye: Secondary | ICD-10-CM | POA: Diagnosis not present

## 2020-03-13 DIAGNOSIS — E113412 Type 2 diabetes mellitus with severe nonproliferative diabetic retinopathy with macular edema, left eye: Secondary | ICD-10-CM

## 2020-03-13 MED ORDER — BEVACIZUMAB 2.5 MG/0.1ML IZ SOSY
2.5000 mg | PREFILLED_SYRINGE | INTRAVITREAL | Status: AC | PRN
Start: 2020-03-13 — End: 2020-03-13
  Administered 2020-03-13: 2.5 mg via INTRAVITREAL

## 2020-03-13 NOTE — Assessment & Plan Note (Signed)
Some component of visual acuity impairment by the amount of lens opacity present

## 2020-03-13 NOTE — Progress Notes (Signed)
03/13/2020     CHIEF COMPLAINT Patient presents for Retina Follow Up (6 Wk F/U OS, poss Avastin OS//Pt denies noticeable changes to [place] OU since last visit. Pt denies ocular pain, flashes of light, or floaters OU. //LBS: 160 yesterday)   HISTORY OF PRESENT ILLNESS: Cameron Glover is a 70 y.o. male who presents to the clinic today for:   HPI    Retina Follow Up    Patient presents with  Diabetic Retinopathy.  In left eye.  This started 6 weeks ago.  Severity is mild.  Duration of 6 weeks.  Since onset it is stable. Additional comments: 6 Wk F/U OS, poss Avastin OS  Pt denies noticeable changes to [place] OU since last visit. Pt denies ocular pain, flashes of light, or floaters OU.   LBS: 160 yesterday       Last edited by Rockie Neighbours, [place] on 03/13/2020  2:41 PM. (History)      Referring physician: [name], PA-C [address] [place],  [place] [address]  HISTORICAL INFORMATION:   Selected notes from the MEDICAL RECORD NUMBER    Lab Results  Component Value Date   HGBA1C 6.2 (H) 02/11/2020     CURRENT MEDICATIONS: No current outpatient medications on file. (Ophthalmic Drugs)   No current facility-administered medications for this visit. (Ophthalmic Drugs)   Current Outpatient Medications (Other)  Medication Sig  . amLODipine (NORVASC) 10 MG tablet Take 1 tablet (10 mg total) by mouth daily.  Marland Kitchen atorvastatin (LIPITOR) 40 MG tablet Take 1 tablet (40 mg total) by mouth daily.  . blood glucose meter kit and supplies KIT Dispense based on patient and insurance preference. Use up to four times daily as directed. (FOR ICD-9 250.00, 250.01).  . Glucose Blood (BLOOD GLUCOSE TEST STRIPS) STRP Use as directed to monitor FSBS once daily, for ICD 10 E11.65  . lisinopril-hydrochlorothiazide (ZESTORETIC) 20-12.5 MG tablet Take 1 tablet by mouth daily.  . metFORMIN (GLUCOPHAGE) 1000 MG tablet TAKE 1 TABLET (1,000 MG TOTAL) BY MOUTH 2 (TWO) TIMES DAILY WITH A MEAL.  .  pantoprazole (PROTONIX) 40 MG tablet TAKE 1 TABLET BY MOUTH EVERY DAY   No current facility-administered medications for this visit. (Other)      REVIEW OF SYSTEMS:    ALLERGIES No Known Allergies  PAST MEDICAL HISTORY Past Medical History:  Diagnosis Date  . Acute renal failure (ARF) ([place])   . AKI (acute kidney injury) ([place]) 09/02/2017  . Anemia due to chronic kidney disease 04/15/2018  . DM (diabetes mellitus) with complications ([place]) 08/13/1749  . Elevated lipase 04/15/2018  . Hyperglycemia without ketosis   . Hyperlipidemia   . Hypertension    Past Surgical History:  Procedure Laterality Date  . COLONOSCOPY WITH PROPOFOL N/A 01/15/2019   Procedure: COLONOSCOPY WITH PROPOFOL;  Surgeon: Jonathon Bellows, MD;  Location: [facility] ENDOSCOPY;  Service: Gastroenterology;  Laterality: N/A;    FAMILY HISTORY Family History  Problem Relation Age of Onset  . Kidney failure Sister   . Cancer Sister   . Diabetes Sister   . Hypertension Sister   . Hyperlipidemia Sister   . Heart disease Sister   . Kidney disease Sister   . Cancer Brother   . CAD Brother   . Diabetes Brother   . Hypertension Brother   . Hyperlipidemia Brother   . Heart disease Brother   . Diabetes Other   . Cancer Mother     SOCIAL HISTORY Social History   Tobacco Use  .  Smoking status: Never Smoker  . Smokeless tobacco: Never Used  Vaping Use  . Vaping Use: Never used  Substance Use Topics  . Alcohol use: Never  . Drug use: Never         OPHTHALMIC EXAM:  Base Eye Exam    Visual Acuity (ETDRS)      Right Left   Dist [place] 20/30 -1 20/70   Dist ph [place] 20/25 +2 20/40       Tonometry (Tonopen, 2:41 PM)      Right Left   Pressure 14 17       Pupils      Pupils Dark Light Shape React APD   Right PERRL 5 4 Round Sluggish None   Left PERRL 5 4 Round Sluggish None       Visual Fields (Counting fingers)      Left Right    Full Full       Extraocular Movement      Right Left    Full Full        Neuro/Psych    Oriented x3: Yes   Mood/Affect: Normal       Dilation    Left eye: 1.0% Mydriacyl, 2.5% Phenylephrine @ 2:45 PM        Slit Lamp and Fundus Exam    External Exam      Right Left   External Normal Normal       Slit Lamp Exam      Right Left   Lids/Lashes Normal Normal   Conjunctiva/Sclera White and quiet White and quiet   Cornea Clear Clear   Anterior Chamber Deep and quiet Deep and quiet   Iris Round and reactive Round and reactive   Lens 3+ Nuclear sclerosis 3+ Nuclear sclerosis   Anterior Vitreous Normal Normal       Fundus Exam      Right Left   Posterior Vitreous  Posterior vitreous detachment   Disc  Normal   C/D Ratio  0.5   Macula  Microaneurysms, , Mild clinically significant macular edema   Vessels  NPDR severe   Periphery  Cotton wool spots          IMAGING AND PROCEDURES  Imaging and Procedures for 03/13/20  OCT, Retina - OU - Both Eyes       Right Eye Central Foveal Thickness: 320. Progression has improved. Findings include abnormal foveal contour, vitreomacular adhesion .   Left Eye Central Foveal Thickness: 314. Progression has been stable. Findings include vitreomacular adhesion , abnormal foveal contour.   Notes OD 1 week post injection with improved center involved CSME  OS with center involved CSME she will need repeat injection Avastin OS today       Intravitreal Injection, Pharmacologic Agent - OS - Left Eye       Time Out 03/13/2020. 3:31 PM. Confirmed correct patient, procedure, site, and patient consented.   Anesthesia Topical anesthesia was used. Anesthetic medications included [place] 3.5%.   Procedure Preparation included 10% betadine to eyelids, 5% betadine to ocular surface, Ofloxacin . A 30 gauge needle was used.   Injection:  2.5 mg Bevacizumab (AVASTIN) 2.109m/0.1mL SOSY   NDC:: 36644-034-74 Lot:: [id number]  Route: Intravitreal, Site: Left Eye  Post-op Post injection exam found visual acuity of  at least counting fingers. The patient tolerated the procedure well. There were no complications. The patient received written and verbal post procedure care education. Post injection medications were not given.  ASSESSMENT/PLAN:  Severe nonproliferative diabetic retinopathy of left eye, with macular edema, associated with type 2 diabetes mellitus (HCC) CSME continues to be active center involved left eye for intravitreal Avastin OS today  Severe nonproliferative diabetic retinopathy of right eye, with macular edema, associated with type 2 diabetes mellitus (HCC) OD 1 week post injection with improvement in CSME center involvement  Nuclear sclerotic cataract of left eye Some component of visual acuity impairment by the amount of lens opacity present      ICD-10-CM   1. Severe nonproliferative diabetic retinopathy of left eye, with macular edema, associated with type 2 diabetes mellitus (HCC)  Z14.6047 OCT, Retina - OU - Both Eyes    Intravitreal Injection, Pharmacologic Agent - OS - Left Eye    bevacizumab (AVASTIN) SOSY 2.5 mg  2. Severe nonproliferative diabetic retinopathy of right eye, with macular edema, associated with type 2 diabetes mellitus ([place])  E11.3411   3. Nuclear sclerotic cataract of left eye  H25.12     1.  Repeat injection intravitreal Avastin OS today for center involved CSME to maintain stability  2.  OD, treatment sensitive CSME, improved 1 week post injection by OCT exam today  3.  Follow-up OD as scheduled  4.  Dilate left eye in 6 weeks  Ophthalmic Meds Ordered this visit:  Meds ordered this encounter  Medications  . bevacizumab (AVASTIN) SOSY 2.5 mg       Return in about 6 weeks (around 04/24/2020) for dilate, OS, AVASTIN OCT.  There are no Patient Instructions on file for this visit.   Explained the diagnoses, plan, and follow up with the patient and they expressed understanding.  Patient expressed understanding of the  importance of proper follow up care.   Clent Demark Nickalous Stingley M.D. Diseases & Surgery of the Retina and Vitreous Retina & Diabetic [place] 03/13/20     Abbreviations: M myopia (nearsighted); A astigmatism; H hyperopia (farsighted); P presbyopia; Mrx spectacle prescription;  CTL contact lenses; OD right eye; OS left eye; OU both eyes  XT exotropia; ET esotropia; PEK punctate epithelial keratitis; PEE punctate epithelial erosions; DES dry eye syndrome; MGD meibomian gland dysfunction; ATs artificial tears; PFAT's preservative free artificial tears; [place] nuclear sclerotic cataract; PSC posterior subcapsular cataract; ERM epi-retinal membrane; PVD posterior vitreous detachment; RD retinal detachment; DM diabetes mellitus; DR diabetic retinopathy; NPDR non-proliferative diabetic retinopathy; PDR proliferative diabetic retinopathy; CSME clinically significant macular edema; DME diabetic macular edema; dbh dot blot hemorrhages; CWS cotton wool spot; POAG primary open angle glaucoma; C/D cup-to-disc ratio; HVF humphrey visual field; GVF goldmann visual field; OCT optical coherence tomography; IOP intraocular pressure; BRVO Branch retinal vein occlusion; CRVO central retinal vein occlusion; CRAO central retinal artery occlusion; BRAO branch retinal artery occlusion; RT retinal tear; SB scleral buckle; PPV pars plana vitrectomy; VH Vitreous hemorrhage; PRP panretinal laser photocoagulation; IVK intravitreal kenalog; VMT vitreomacular traction; MH Macular hole;  NVD neovascularization of the disc; NVE neovascularization elsewhere; AREDS age related eye disease study; ARMD age related macular degeneration; POAG primary open angle glaucoma; EBMD epithelial/anterior basement membrane dystrophy; ACIOL anterior chamber intraocular lens; IOL intraocular lens; PCIOL posterior chamber intraocular lens; Phaco/IOL phacoemulsification with intraocular lens placement; [place] photorefractive keratectomy; LASIK laser assisted in situ  keratomileusis; HTN hypertension; DM diabetes mellitus; COPD chronic obstructive pulmonary disease

## 2020-03-13 NOTE — Assessment & Plan Note (Signed)
CSME continues to be active center involved left eye for intravitreal Avastin OS today

## 2020-03-13 NOTE — Assessment & Plan Note (Signed)
OD 1 week post injection with improvement in CSME center involvement

## 2020-03-19 ENCOUNTER — Encounter: Payer: Self-pay | Admitting: Family Medicine

## 2020-03-20 ENCOUNTER — Other Ambulatory Visit: Payer: Self-pay | Admitting: Family Medicine

## 2020-03-20 DIAGNOSIS — E119 Type 2 diabetes mellitus without complications: Secondary | ICD-10-CM

## 2020-03-25 ENCOUNTER — Other Ambulatory Visit: Payer: Self-pay

## 2020-03-25 ENCOUNTER — Ambulatory Visit (INDEPENDENT_AMBULATORY_CARE_PROVIDER_SITE_OTHER): Payer: Medicare Other | Admitting: Physician Assistant

## 2020-03-25 ENCOUNTER — Encounter: Payer: Self-pay | Admitting: Physician Assistant

## 2020-03-25 VITALS — BP 134/86 | HR 96 | Temp 98.9°F | Resp 16 | Ht 65.0 in | Wt 178.9 lb

## 2020-03-25 DIAGNOSIS — D649 Anemia, unspecified: Secondary | ICD-10-CM | POA: Diagnosis not present

## 2020-03-25 DIAGNOSIS — R7989 Other specified abnormal findings of blood chemistry: Secondary | ICD-10-CM | POA: Diagnosis not present

## 2020-03-25 NOTE — Patient Instructions (Signed)
[employer] medicine (25th ed., pp. [phone]). [place], [place]: Elsevier.">  Anemia  Anemia is a condition in which there is not enough red blood cells or hemoglobin in the blood. Hemoglobin is a substance in red blood cells that carries oxygen. When you do not have enough red blood cells or hemoglobin (are anemic), your body cannot get enough oxygen and your organs may not work properly. As a result, you may feel very tired or have other problems. What are the causes? Common causes of anemia include:  Excessive bleeding. Anemia can be caused by excessive bleeding inside or outside the body, including bleeding from the intestines or from heavy menstrual periods in females.  Poor nutrition.  Long-lasting (chronic) kidney, thyroid, and liver disease.  Bone marrow disorders, spleen problems, and blood disorders.  Cancer and treatments for cancer.  HIV (human immunodeficiency virus) and AIDS (acquired immunodeficiency syndrome).  Infections, medicines, and autoimmune disorders that destroy red blood cells. What are the signs or symptoms? Symptoms of this condition include:  Minor weakness.  Dizziness.  Headache, or difficulties concentrating and sleeping.  Heartbeats that feel irregular or faster than normal (palpitations).  Shortness of breath, especially with exercise.  Pale skin, lips, and nails, or cold hands and feet.  Indigestion and nausea. Symptoms may occur suddenly or develop slowly. If your anemia is mild, you may not have symptoms. How is this diagnosed? This condition is diagnosed based on blood tests, your medical history, and a physical exam. In some cases, a test may be needed in which cells are removed from the soft tissue inside of a bone and looked at under a microscope (bone marrow biopsy). Your health care provider may also check your stool (feces) for blood and may do additional testing to look for the cause of your bleeding. Other tests may  include:  Imaging tests, such as a CT scan or MRI.  A procedure to see inside your esophagus and stomach (endoscopy).  A procedure to see inside your colon and rectum (colonoscopy). How is this treated? Treatment for this condition depends on the cause. If you continue to lose a lot of blood, you may need to be treated at a hospital. Treatment may include:  Taking supplements of iron, vitamin H67, or folic acid.  Taking a hormone medicine (erythropoietin) that can help to stimulate red blood cell growth.  Having a blood transfusion. This may be needed if you lose a lot of blood.  Making changes to your diet.  Having surgery to remove your spleen. Follow these instructions at home:  Take over-the-counter and prescription medicines only as told by your health care provider.  Take supplements only as told by your health care provider.  Follow any diet instructions that you were given by your health care provider.  Keep all follow-up visits as told by your health care provider. This is important. Contact a health care provider if:  You develop new bleeding anywhere in the body. Get help right away if:  You are very weak.  You are short of breath.  You have pain in your abdomen or chest.  You are dizzy or feel faint.  You have trouble concentrating.  You have bloody stools, black stools, or tarry stools.  You vomit repeatedly or you vomit up blood. These symptoms may represent a serious problem that is an emergency. Do not wait to see if the symptoms will go away. Get medical help right away. Call your local emergency services (911 in the [place] Do not  drive yourself to the hospital. Summary  Anemia is a condition in which you do not have enough red blood cells or enough of a substance in your red blood cells that carries oxygen (hemoglobin).  Symptoms may occur suddenly or develop slowly.  If your anemia is mild, you may not have symptoms.  This condition is  diagnosed with blood tests, a medical history, and a physical exam. Other tests may be needed.  Treatment for this condition depends on the cause of the anemia. This information is not intended to replace advice given to you by your health care provider. Make sure you discuss any questions you have with your health care provider. Document Revised: 11/28/2018 Document Reviewed: 11/28/2018 Elsevier Patient Education  2021 [employer].

## 2020-03-25 NOTE — Progress Notes (Signed)
Established patient visit   Patient: Cameron Glover   DOB: [birth year]   70 y.o. Male  MRN: [id number] Visit Date: 03/25/2020  Today's healthcare provider: Trinna Post, PA-C   Chief Complaint  Patient presents with  . Follow-up    Abnormal labs   Subjective    HPI HPI    Follow-up    Comments: Abnormal labs       Last edited by Hollie Salk, [place] on 03/25/2020  1:26 PM. (History)       Anemia  CBC Latest Ref Rng & Units 02/11/2020 02/12/2019 09/22/2018  WBC 3.8 - 10.8 Thousand/uL 5.5 4.5 5.1  Hemoglobin 13.2 - 17.1 g/dL 10.5(L) 12.7(L) 13.9  Hematocrit 38.5 - 50.0 % 31.9(L) 38.1(L) 42.7  Platelets 140 - 400 Thousand/uL 275 229 216   Patient denies history of low iron, blood in stool, vitamin deficiencies. Denies history of thalassemia or thalassemia trait:   Low calcium: last calcium 7.7 and albumin 3.6.   Elevated Serum Creatinine: Slightly elevated on last check. Patient with history of HTN and diabetes.   CMP Latest Ref Rng & Units 02/11/2020 07/23/2019 03/23/2019  Glucose 65 - 99 mg/dL 135(H) 199(H) 150(H)  BUN 7 - 25 mg/dL '18 24 18  ' Creatinine 0.70 - 1.25 mg/dL 1.43(H) 1.21 1.18  Sodium 135 - 146 mmol/L 142 141 139  Potassium 3.5 - 5.3 mmol/L 4.3 4.2 4.2  Chloride 98 - 110 mmol/L 107 108 102  CO2 20 - 32 mmol/L '26 26 26  ' Calcium 8.6 - 10.3 mg/dL 7.7(L) 8.7 8.4(L)  Total Protein 6.1 - 8.1 g/dL 6.5 7.0 -  Total Bilirubin 0.2 - 1.2 mg/dL 0.4 0.5 -  Alkaline Phos 38 - 126 U/L - - -  AST 10 - 35 U/L 13 12 -  ALT 9 - 46 U/L 16 9 -        Medications: Outpatient Medications Prior to Visit  Medication Sig  . amLODipine (NORVASC) 10 MG tablet Take 1 tablet (10 mg total) by mouth daily.  Marland Kitchen atorvastatin (LIPITOR) 40 MG tablet Take 1 tablet (40 mg total) by mouth daily.  . blood glucose meter kit and supplies KIT Dispense based on patient and insurance preference. Use up to four times daily as directed. (FOR ICD-9 250.00, 250.01).  Marland Kitchen  lisinopril-hydrochlorothiazide (ZESTORETIC) 20-12.5 MG tablet Take 1 tablet by mouth daily.  . metFORMIN (GLUCOPHAGE) 1000 MG tablet TAKE 1 TABLET (1,000 MG TOTAL) BY MOUTH 2 (TWO) TIMES DAILY WITH A MEAL.  Marland Kitchen ONETOUCH VERIO test strip USE AS DIRECTED TO MONITOR FSBS ONCE DAILY, FOR ICD 10 E11.65  . pantoprazole (PROTONIX) 40 MG tablet TAKE 1 TABLET BY MOUTH EVERY DAY   No facility-administered medications prior to visit.    Review of Systems  All other systems reviewed and are negative.      Objective    BP 134/86   Pulse 96   Temp 98.9 F (37.2 C) (Oral)   Resp 16   Ht '5\' 5"'  (1.651 m)   Wt 178 lb 14.4 oz (81.1 kg)   SpO2 98%   BMI 29.77 kg/m     Physical Exam Constitutional:      Appearance: Normal appearance.  Cardiovascular:     Rate and Rhythm: Normal rate and regular rhythm.     Heart sounds: Normal heart sounds.  Pulmonary:     Effort: Pulmonary effort is normal.     Breath sounds: Normal breath sounds.  Skin:    General:  Skin is warm and dry.  Neurological:     Mental Status: He is alert and oriented to person, place, and time. Mental status is at baseline.  Psychiatric:        Mood and Affect: Mood normal.        Behavior: Behavior normal.       No results found for any visits on 03/25/20.  Assessment & Plan    1. Anemia, unspecified type  Check labs as below. DDx: iron deficiency, mixed anemia, blood loss, thalassemia trait. Patient has low level normocytic anemia for many years.   - Fe+TIBC+Fer - B12 and Folate Panel - CBC with Differential  2. Elevated serum creatinine  Denies using pre-workout, endorses good hydration. May be some dehydration or may be new baseline.   - Comprehensive Metabolic Panel (CMET)  3. Hypocalcemia  - PTH, Intact (ICMA) and Ionized Calcium   Return if symptoms worsen or fail to improve.      I spent 20 minutes dedicated to the care of this patient on the date of this encounter to include pre-visit review of  records, face-to-face time with the patient discussing anemia, and post visit ordering of testing.    Trinna Post, PA-C  [facility] [phone] (phone) [phone] (fax)  [place]

## 2020-03-26 ENCOUNTER — Other Ambulatory Visit: Payer: Self-pay

## 2020-03-27 LAB — CBC WITH DIFFERENTIAL/PLATELET
Absolute Monocytes: 380 cells/uL (ref 200–950)
Basophils Absolute: 52 cells/uL (ref 0–200)
Basophils Relative: 1 %
Eosinophils Absolute: 120 cells/uL (ref 15–500)
Eosinophils Relative: 2.3 %
HCT: 33.1 % — ABNORMAL LOW (ref 38.5–50.0)
Hemoglobin: 11.2 g/dL — ABNORMAL LOW (ref 13.2–17.1)
Lymphs Abs: 1487 cells/uL (ref 850–3900)
MCH: 31.3 pg (ref 27.0–33.0)
MCHC: 33.8 g/dL (ref 32.0–36.0)
MCV: 92.5 fL (ref 80.0–100.0)
MPV: 10.1 fL (ref 7.5–12.5)
Monocytes Relative: 7.3 %
Neutro Abs: 3162 cells/uL (ref 1500–7800)
Neutrophils Relative %: 60.8 %
Platelets: 303 10*3/uL (ref 140–400)
RBC: 3.58 10*6/uL — ABNORMAL LOW (ref 4.20–5.80)
RDW: 12.2 % (ref 11.0–15.0)
Total Lymphocyte: 28.6 %
WBC: 5.2 10*3/uL (ref 3.8–10.8)

## 2020-03-27 LAB — COMPREHENSIVE METABOLIC PANEL
AG Ratio: 1.3 (calc) (ref 1.0–2.5)
ALT: 14 U/L (ref 9–46)
AST: 13 U/L (ref 10–35)
Albumin: 3.8 g/dL (ref 3.6–5.1)
Alkaline phosphatase (APISO): 87 U/L (ref 35–144)
BUN/Creatinine Ratio: 15 (calc) (ref 6–22)
BUN: 21 mg/dL (ref 7–25)
CO2: 24 mmol/L (ref 20–32)
Calcium: 8.6 mg/dL (ref 8.6–10.3)
Chloride: 106 mmol/L (ref 98–110)
Creat: 1.38 mg/dL — ABNORMAL HIGH (ref 0.70–1.25)
Globulin: 2.9 g/dL (calc) (ref 1.9–3.7)
Glucose, Bld: 142 mg/dL — ABNORMAL HIGH (ref 65–99)
Potassium: 4.4 mmol/L (ref 3.5–5.3)
Sodium: 139 mmol/L (ref 135–146)
Total Bilirubin: 0.4 mg/dL (ref 0.2–1.2)
Total Protein: 6.7 g/dL (ref 6.1–8.1)

## 2020-03-27 LAB — TEST AUTHORIZATION

## 2020-03-27 LAB — IRON,TIBC AND FERRITIN PANEL
%SAT: 37 % (calc) (ref 20–48)
Ferritin: 132 ng/mL (ref 24–380)
Iron: 95 ug/dL (ref 50–180)
TIBC: 254 mcg/dL (calc) (ref 250–425)

## 2020-03-27 LAB — B12 AND FOLATE PANEL
Folate: 9.3 ng/mL
Vitamin B-12: 288 pg/mL (ref 200–1100)

## 2020-03-27 LAB — PTH, INTACT (ICMA) AND IONIZED CALCIUM
Calcium, Ion: 4.65 mg/dL — ABNORMAL LOW (ref 4.8–5.6)
Calcium: 8.6 mg/dL (ref 8.6–10.3)
PTH: 124 pg/mL — ABNORMAL HIGH (ref 16–77)

## 2020-03-27 LAB — VITAMIN D 25 HYDROXY (VIT D DEFICIENCY, FRACTURES): Vit D, 25-Hydroxy: 13 ng/mL — ABNORMAL LOW (ref 30–100)

## 2020-04-08 ENCOUNTER — Ambulatory Visit (INDEPENDENT_AMBULATORY_CARE_PROVIDER_SITE_OTHER): Payer: Medicare Other

## 2020-04-08 ENCOUNTER — Other Ambulatory Visit: Payer: Self-pay

## 2020-04-08 VITALS — BP 150/88 | HR 89 | Temp 98.6°F | Resp 15 | Ht 65.0 in | Wt 181.9 lb

## 2020-04-08 DIAGNOSIS — Z0000 Encounter for general adult medical examination without abnormal findings: Secondary | ICD-10-CM | POA: Diagnosis not present

## 2020-04-08 DIAGNOSIS — E1129 Type 2 diabetes mellitus with other diabetic kidney complication: Secondary | ICD-10-CM | POA: Diagnosis not present

## 2020-04-08 DIAGNOSIS — R809 Proteinuria, unspecified: Secondary | ICD-10-CM

## 2020-04-08 MED ORDER — ACCU-CHEK SOFTCLIX LANCETS MISC: 12 refills | Status: DC

## 2020-04-08 MED ORDER — ACCU-CHEK GUIDE VI STRP: ORAL_STRIP | 12 refills | Status: DC

## 2020-04-08 NOTE — Progress Notes (Signed)
Subjective:   Cameron Glover is a 70 y.o. male who presents for Medicare Annual/Subsequent preventive examination.  Review of Systems     Cardiac Risk Factors include: advanced age (>49mn, >>3women);dyslipidemia;diabetes mellitus;hypertension;obesity (BMI >30kg/m2);male gender     Objective:    Today's Vitals   04/08/20 1415  BP: (!) 150/88  Pulse: 89  Resp: 15  Temp: 98.6 F (37 C)  TempSrc: Oral  SpO2: 98%  Weight: 181 lb 14.4 oz (82.5 kg)  Height: '5\' 5"'  (1.651 m)   Body mass index is 30.27 kg/m.  Advanced Directives 04/08/2020 04/05/2019 01/15/2019 09/02/2017  Does Patient Have a Medical Advance Directive? No No No No  Would patient like information on creating a medical advance directive? Yes (MAU/Ambulatory/Procedural Areas - Information given) No - Patient declined No - Patient declined Yes (Inpatient - patient requests chaplain consult to create a medical advance directive)    Current Medications (verified) Outpatient Encounter Medications as of 04/08/2020  Medication Sig  . Accu-Chek Softclix Lancets lancets Use as instructed to check blood sugar  . amLODipine (NORVASC) 10 MG tablet Take 1 tablet (10 mg total) by mouth daily.  .Marland Kitchenatorvastatin (LIPITOR) 40 MG tablet Take 1 tablet (40 mg total) by mouth daily.  . Blood Glucose Monitoring Suppl (ACCU-CHEK GUIDE ME) w/Device KIT by Does not apply route. Sample glucometer provided in office  . glucose blood (ACCU-CHEK GUIDE) test strip Use as instructed to check blood sugar  . lisinopril-hydrochlorothiazide (ZESTORETIC) 20-12.5 MG tablet Take 1 tablet by mouth daily.  . metFORMIN (GLUCOPHAGE) 1000 MG tablet TAKE 1 TABLET (1,000 MG TOTAL) BY MOUTH 2 (TWO) TIMES DAILY WITH A MEAL.  . pantoprazole (PROTONIX) 40 MG tablet TAKE 1 TABLET BY MOUTH EVERY DAY  . [DISCONTINUED] blood glucose meter kit and supplies KIT Dispense based on patient and insurance preference. Use up to four times daily as directed. (FOR ICD-9 250.00, 250.01).  (Patient not taking: Reported on 04/08/2020)  . [DISCONTINUED] ONETOUCH VERIO test strip USE AS DIRECTED TO MONITOR FSBS ONCE DAILY, FOR ICD 10 E11.65 (Patient not taking: Reported on 04/08/2020)   No facility-administered encounter medications on file as of 04/08/2020.    Allergies (verified) Patient has no known allergies.   History: Past Medical History:  Diagnosis Date  . Acute [place] ([place]F) [place]   . [place]) [place] 09/02/2017  . Anemia due to chronic kidney disease 04/15/2018  . DM (diabetes mellitus) with complications ([facility] 81/06/2692 . Elevated lipase 04/15/2018  . Hyperglycemia without ketosis   . Hyperlipidemia   . Hypertension    Past Surgical History:  Procedure Laterality Date  . COLONOSCOPY WITH PROPOFOL N/A 01/15/2019   Procedure: COLONOSCOPY WITH PROPOFOL;  Surgeon: AJonathon Bellows MD;  [facility];  Service: Gastroenterology;  Laterality: N/A;   Family History  Problem Relation Age of Onset  . Kidney failure Sister   . Cancer Sister   . Diabetes Sister   . Hypertension Sister   . Hyperlipidemia Sister   . Heart disease Sister   . Kidney disease Sister   . Cancer Brother   . CAD Brother   . Diabetes Brother   . Hypertension Brother   . Hyperlipidemia Brother   . Heart disease Brother   . Diabetes Other   . Cancer Mother    Social History   Socioeconomic History  . Marital status: Single    Spouse name: Not on file  . Number of children: Not on file  .  Years of education: Not on file  . Highest education level: High school graduate  Occupational History  . Occupation: Retired    Comment: Administrator, [employer]  Tobacco Use  . Smoking status: Never Smoker  . Smokeless tobacco: Never Used  Vaping Use  . Vaping Use: Never used  Substance and Sexual Activity  . Alcohol use: Never  . Drug use: Never  . Sexual activity: Yes    Comment: couple male partners  Other Topics Concern  . Not on file  Social History Narrative   Pt  lives with his nephew   Social Determinants of Health   Financial Resource Strain: Low Risk   . Difficulty of Paying Living Expenses: Not very hard  Food Insecurity: No Food Insecurity  . Worried About Charity fundraiser in the Last Year: Never true  . Ran Out of Food in the Last Year: Never true  Transportation Needs: No Transportation Needs  . Lack of Transportation (Medical): No  . Lack of Transportation (Non-Medical): No  Physical Activity: Sufficiently Active  . Days of Exercise per Week: 5 days  . Minutes of Exercise per Session: 30 min  Stress: No Stress Concern Present  . Feeling of Stress : Only a little  Social Connections: Socially Isolated  . Frequency of Communication with Friends and Family: More than three times a week  . Frequency of Social Gatherings with Friends and Family: More than three times a week  . Attends Religious Services: Never  . Active Member of Clubs or Organizations: No  . Attends Archivist Meetings: Never  . Marital Status: Never married    Tobacco Counseling Counseling given: Not Answered   Clinical Intake:  Pre-visit preparation completed: Yes  Pain : No/denies pain     BMI - recorded: 30.27 Nutritional Status: BMI > 30  Obese Nutritional Risks: None Diabetes: Yes CBG done?: No Did pt. bring in CBG monitor from home?: No  How often do you need to have someone help you when you read instructions, pamphlets, or other written materials from your doctor or pharmacy?: 1 - Never  Nutrition Risk Assessment:  Has the patient had any N/V/D within the last 2 months?  No  Does the patient have any non-healing wounds?  No  Has the patient had any unintentional weight loss or weight gain?  No   Diabetes:  Is the patient diabetic?  Yes  If diabetic, was a CBG obtained today?  No  Did the patient bring in their glucometer from home?  No  How often do you monitor your CBG's? 2-3 times per week; pt given Accu Chek Guide Me meter  in office due to current meter no longer working; pt will need new rx for test strips and lancets sent to pharmacy. Verbal order given per [name] [facility]  Financial Strains and Diabetes Management:  Are you having any financial strains with the device, your supplies or your medication? No .  Does the patient want to be seen by Chronic Care Management for management of their diabetes?  No  Would the patient like to be referred to a Nutritionist or for Diabetic Management?  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   Diabetic Foot Exam: Completed 07/23/19.   Interpreter Needed?: No  Information entered by :: Clemetine Marker LPN   Activities of Daily Living In your present state of health, do you have any difficulty performing the following activities: 04/08/2020 03/25/2020  Hearing?  N N  Comment declines hearing aids -  Vision? N N  Difficulty concentrating or making decisions? N N  Walking or climbing stairs? N N  Dressing or bathing? N N  Doing errands, shopping? N N  Preparing Food and eating ? N -  Using the Toilet? N -  In the past six months, have you accidently leaked urine? N -  Do you have problems with loss of bowel control? N -  Managing your Medications? N -  Managing your Finances? N -  Housekeeping or managing your Housekeeping? N -  Some recent data might be hidden    Patient Care Team: [name], PA-C as PCP - General (Family Medicine) Jettie Booze, MD as PCP - Cardiology (Cardiology)  Indicate any recent Medical Services you may have received from other than [facility] providers in the past year (date may be approximate).     Assessment:   This is a routine wellness examination for [place].  Hearing/Vision screen  Hearing Screening   '125Hz'  '250Hz'  '500Hz'  '1000Hz'  '2000Hz'  '3000Hz'  '4000Hz'  '6000Hz'  '8000Hz'   Right ear:           Left ear:           Comments: Pt denies hearing difficulty  Vision Screening Comments: Annual vision  screenings done by Dr. Deloria Lair in [place]  Dietary issues and exercise activities discussed: Current Exercise Habits: Home exercise routine, Type of exercise: walking, Time (Minutes): 30, Frequency (Times/Week): 5, Weekly Exercise (Minutes/Week): 150, Intensity: Moderate, Exercise limited by: None identified  Goals    . Increase physical activity     Recommend increasing physical activity to 150 minutes per week     . Patient Stated     Patient would like to maintain physical activity and keep A1c < 6.5%      Depression Screen PHQ 2/9 Scores 04/08/2020 03/25/2020 02/11/2020 07/23/2019 04/05/2019 03/23/2019 02/12/2019  PHQ - 2 Score 0 0 0 0 0 0 0  PHQ- 9 Score - - 0 0 - 0 2    Fall Risk Fall Risk  04/08/2020 03/25/2020 02/11/2020 07/23/2019 04/05/2019  Falls in the past year? 0 0 0 0 0  Number falls in past yr: 0 0 0 0 0  Injury with Fall? 0 0 0 0 0  Risk for fall due to : No Fall Risks - - - No Fall Risks  Follow up Falls prevention discussed Falls evaluation completed - - Falls prevention discussed    FALL RISK PREVENTION PERTAINING TO THE HOME:  Any stairs in or around the home? Yes  If so, are there any without handrails? No  Home free of loose throw rugs in walkways, pet beds, electrical cords, etc? Yes  Adequate lighting in your home to reduce risk of falls? Yes   ASSISTIVE DEVICES UTILIZED TO PREVENT FALLS:  Life alert? No  Use of a cane, walker or w/c? No  Grab bars in the bathroom? No  Shower chair or bench in shower? No  Elevated toilet seat or a handicapped toilet? No   TIMED UP AND GO:  Was the test performed? Yes .  Length of time to ambulate 10 feet: 5 sec.   Gait steady and fast without use of assistive device  Cognitive Function:     6CIT Screen 04/08/2020 04/05/2019  What Year? 0 points 0 points  What month? 0 points 0 points  What time? 0 points 0 points  Count back from 20 0 points 0 points  Months in  reverse 4 points 4 points  Repeat phrase 0 points 2  points  Total Score 4 6    Immunizations Immunization History  Administered Date(s) Administered  . Influenza,inj,Quad PF,6+ Mos 10/04/2017  . Influenza-Unspecified 10/04/2018  . PFIZER(Purple Top)SARS-COV-2 Vaccination 03/04/2019, 03/28/2019, 01/15/2020  . Pneumococcal Conjugate-13 07/23/2019  . Pneumococcal Polysaccharide-23 09/03/2017    TDAP status: Due, Education has been provided regarding the importance of this vaccine. Advised may receive this vaccine at local pharmacy or Health Dept. Aware to provide a copy of the vaccination record if obtained from local pharmacy or Health Dept. Verbalized acceptance and understanding.  Flu Vaccine status: Due, Education has been provided regarding the importance of this vaccine. Advised may receive this vaccine at local pharmacy or Health Dept. Aware to provide a copy of the vaccination record if obtained from local pharmacy or Health Dept. Verbalized acceptance and understanding.  Pneumococcal vaccine status: Up to date  Covid-19 vaccine status: Completed vaccines  Qualifies for Shingles Vaccine? Yes   Zostavax completed No   Shingrix Completed?: No.    Education has been provided regarding the importance of this vaccine. Patient has been advised to call insurance company to determine out of pocket expense if they have not yet received this vaccine. Advised may also receive vaccine at local pharmacy or Health Dept. Verbalized acceptance and understanding.  Screening Tests Health Maintenance  Topic Date Due  . TETANUS/TDAP  01/04/2021 (Originally 11/14/1969)  . FOOT EXAM  07/22/2020  . INFLUENZA VACCINE  08/04/2020  . HEMOGLOBIN A1C  08/10/2020  . OPHTHALMOLOGY EXAM  03/13/2021  . PNA vac Low Risk Adult (2 of 2 - PPSV23) 09/04/2022  . COLONOSCOPY (Pts 45-60yr Insurance coverage will need to be confirmed)  01/15/2024  . COVID-19 Vaccine  Completed  . Hepatitis C Screening  Completed  . HPV VACCINES  Aged Out    Health  Maintenance  There are no preventive care reminders to display for this patient.  Colorectal cancer screening: Type of screening: Colonoscopy. Completed 01/15/19. Repeat every 5 years  Lung Cancer Screening: (Low Dose CT Chest recommended if Age 68-80years, 30 pack-year currently smoking OR have quit w/in 15years.) does not qualify.   Additional Screening:  Hepatitis C Screening: does qualify; Completed 04/12/18  Vision Screening: Recommended annual ophthalmology exams for early detection of glaucoma and other disorders of the eye. Is the patient up to date with their annual eye exam?  Yes  Who is the provider or what is the name of the office in which the patient attends annual eye exams? Dr. RZadie Rhine  Dental Screening: Recommended annual dental exams for proper oral hygiene  Community Resource Referral / Chronic Care Management: CRR required this visit?  No   CCM required this visit?  No      Plan:     I have personally reviewed and noted the following in the patient's chart:   . Medical and social history . Use of alcohol, tobacco or illicit drugs  . Current medications and supplements . Functional ability and status . Nutritional status . Physical activity . Advanced directives . List of other physicians . Hospitalizations, surgeries, and ER visits in previous 12 months . Vitals . Screenings to include cognitive, depression, and falls . Referrals and appointments  In addition, I have reviewed and discussed with patient certain preventive protocols, quality metrics, and best practice recommendations. A written personalized care plan for preventive services as well as general preventive health recommendations were provided to patient.  Clemetine Marker, LPN   [id number]   Nurse Notes: none

## 2020-04-08 NOTE — Patient Instructions (Signed)
[name] , Thank you for taking time to come for your Medicare Wellness Visit. I appreciate your ongoing commitment to your health goals. Please review the following plan we discussed and let me know if I can assist you in the future.   Screening recommendations/referrals: Colonoscopy: done 01/15/19. Repeat in 2026. Recommended yearly ophthalmology/optometry visit for glaucoma screening and checkup Recommended yearly dental visit for hygiene and checkup  Vaccinations: Influenza vaccine: due Pneumococcal vaccine: done 07/23/19 Tdap vaccine: due Shingles vaccine: Shingrix discussed. Please contact your pharmacy for coverage information.  Covid-19:  Done 03/04/19, 03/28/19 & 01/15/20  Advanced directives: Advance directive discussed with you today. I have provided a copy for you to complete at home and have notarized. Once this is complete please bring a copy in to our office so we can scan it into your chart.  Conditions/risks identified: Keep up the great work!  Next appointment: Follow up in one year for your annual wellness visit.   Preventive Care 68 Years and Older, Male Preventive care refers to lifestyle choices and visits with your health care provider that can promote health and wellness. What does preventive care include?  A yearly physical exam. This is also called an annual well check.  Dental exams once or twice a year.  Routine eye exams. Ask your health care provider how often you should have your eyes checked.  Personal lifestyle choices, including:  Daily care of your teeth and gums.  Regular physical activity.  Eating a healthy diet.  Avoiding tobacco and drug use.  Limiting alcohol use.  Practicing safe sex.  Taking low doses of aspirin every day.  Taking vitamin and mineral supplements as recommended by your health care provider. What happens during an annual well check? The services and screenings done by your health care provider during your annual  well check will depend on your age, overall health, lifestyle risk factors, and family history of disease. Counseling  Your health care provider may ask you questions about your:  Alcohol use.  Tobacco use.  Drug use.  Emotional well-being.  Home and relationship well-being.  Sexual activity.  Eating habits.  History of falls.  Memory and ability to understand (cognition).  Work and work Statistician. Screening  You may have the following tests or measurements:  Height, weight, and BMI.  Blood pressure.  Lipid and cholesterol levels. These may be checked every 5 years, or more frequently if you are over 68 years old.  Skin check.  Lung cancer screening. You may have this screening every year starting at age 27 if you have a 30-pack-year history of smoking and currently smoke or have quit within the past 15 years.  Fecal occult blood test (FOBT) of the stool. You may have this test every year starting at age 68.  Flexible sigmoidoscopy or colonoscopy. You may have a sigmoidoscopy every 5 years or a colonoscopy every 10 years starting at age 40.  Prostate cancer screening. Recommendations will vary depending on your family history and other risks.  Hepatitis C blood test.  Hepatitis B blood test.  Sexually transmitted disease (STD) testing.  Diabetes screening. This is done by checking your blood sugar (glucose) after you have not eaten for a while (fasting). You may have this done every 1-3 years.  Abdominal aortic aneurysm (AAA) screening. You may need this if you are a current or former smoker.  Osteoporosis. You may be screened starting at age 68 if you are at high risk. Talk with your health  care provider about your test results, treatment options, and if necessary, the need for more tests. Vaccines  Your health care provider may recommend certain vaccines, such as:  Influenza vaccine. This is recommended every year.  Tetanus, diphtheria, and acellular  pertussis (Tdap, Td) vaccine. You may need a Td booster every 10 years.  Zoster vaccine. You may need this after age 68.  Pneumococcal 13-valent conjugate (PCV13) vaccine. One dose is recommended after age 17.  Pneumococcal polysaccharide (PPSV23) vaccine. One dose is recommended after age 68. Talk to your health care provider about which screenings and vaccines you need and how often you need them. This information is not intended to replace advice given to you by your health care provider. Make sure you discuss any questions you have with your health care provider. Document Released: 01/17/2015 Document Revised: 09/10/2015 Document Reviewed: 10/22/2014 Elsevier Interactive Patient Education  2017 [place] Prevention in the Home Falls can cause injuries. They can happen to people of all ages. There are many things you can do to make your home safe and to help prevent falls. What can I do on the outside of my home?  Regularly fix the edges of walkways and driveways and fix any cracks.  Remove anything that might make you trip as you walk through a door, such as a raised step or threshold.  Trim any bushes or trees on the path to your home.  Use bright outdoor lighting.  Clear any walking paths of anything that might make someone trip, such as rocks or tools.  Regularly check to see if handrails are loose or broken. Make sure that both sides of any steps have handrails.  Any raised decks and porches should have guardrails on the edges.  Have any leaves, snow, or ice cleared regularly.  Use sand or salt on walking paths during winter.  Clean up any spills in your garage right away. This includes oil or grease spills. What can I do in the bathroom?  Use night lights.  Install grab bars by the toilet and in the tub and shower. Do not use towel bars as grab bars.  Use non-skid mats or decals in the tub or shower.  If you need to sit down in the shower, use a plastic,  non-slip stool.  Keep the floor dry. Clean up any water that spills on the floor as soon as it happens.  Remove soap buildup in the tub or shower regularly.  Attach bath mats securely with double-sided non-slip rug tape.  Do not have throw rugs and other things on the floor that can make you trip. What can I do in the bedroom?  Use night lights.  Make sure that you have a light by your bed that is easy to reach.  Do not use any sheets or blankets that are too big for your bed. They should not hang down onto the floor.  Have a firm chair that has side arms. You can use this for support while you get dressed.  Do not have throw rugs and other things on the floor that can make you trip. What can I do in the kitchen?  Clean up any spills right away.  Avoid walking on wet floors.  Keep items that you use a lot in easy-to-reach places.  If you need to reach something above you, use a strong step stool that has a grab bar.  Keep electrical cords out of the way.  Do not use floor  polish or wax that makes floors slippery. If you must use wax, use non-skid floor wax.  Do not have throw rugs and other things on the floor that can make you trip. What can I do with my stairs?  Do not leave any items on the stairs.  Make sure that there are handrails on both sides of the stairs and use them. Fix handrails that are broken or loose. Make sure that handrails are as long as the stairways.  Check any carpeting to make sure that it is firmly attached to the stairs. Fix any carpet that is loose or worn.  Avoid having throw rugs at the top or bottom of the stairs. If you do have throw rugs, attach them to the floor with carpet tape.  Make sure that you have a light switch at the top of the stairs and the bottom of the stairs. If you do not have them, ask someone to add them for you. What else can I do to help prevent falls?  Wear shoes that:  Do not have high heels.  Have rubber  bottoms.  Are comfortable and fit you well.  Are closed at the toe. Do not wear sandals.  If you use a stepladder:  Make sure that it is fully opened. Do not climb a closed stepladder.  Make sure that both sides of the stepladder are locked into place.  Ask someone to hold it for you, if possible.  Clearly mark and make sure that you can see:  Any grab bars or handrails.  First and last steps.  Where the edge of each step is.  Use tools that help you move around (mobility aids) if they are needed. These include:  Canes.  Walkers.  Scooters.  Crutches.  Turn on the lights when you go into a dark area. Replace any light bulbs as soon as they burn out.  Set up your furniture so you have a clear path. Avoid moving your furniture around.  If any of your floors are uneven, fix them.  If there are any pets around you, be aware of where they are.  Review your medicines with your doctor. Some medicines can make you feel dizzy. This can increase your chance of falling. Ask your doctor what other things that you can do to help prevent falls. This information is not intended to replace advice given to you by your health care provider. Make sure you discuss any questions you have with your health care provider. Document Released: 10/17/2008 Document Revised: 05/29/2015 Document Reviewed: 01/25/2014 Elsevier Interactive Patient Education  2017 [employer].

## 2020-04-10 ENCOUNTER — Other Ambulatory Visit: Payer: Self-pay

## 2020-04-10 ENCOUNTER — Ambulatory Visit (INDEPENDENT_AMBULATORY_CARE_PROVIDER_SITE_OTHER): Payer: Medicare Other | Admitting: Ophthalmology

## 2020-04-10 ENCOUNTER — Encounter (INDEPENDENT_AMBULATORY_CARE_PROVIDER_SITE_OTHER): Payer: Self-pay | Admitting: Ophthalmology

## 2020-04-10 DIAGNOSIS — E113411 Type 2 diabetes mellitus with severe nonproliferative diabetic retinopathy with macular edema, right eye: Secondary | ICD-10-CM

## 2020-04-10 DIAGNOSIS — E113412 Type 2 diabetes mellitus with severe nonproliferative diabetic retinopathy with macular edema, left eye: Secondary | ICD-10-CM

## 2020-04-10 MED ORDER — BEVACIZUMAB 2.5 MG/0.1ML IZ SOSY
2.5000 mg | PREFILLED_SYRINGE | INTRAVITREAL | Status: AC | PRN
  Administered 2020-04-10: 2.5 mg via INTRAVITREAL

## 2020-04-10 NOTE — Assessment & Plan Note (Signed)
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

## 2020-04-10 NOTE — Assessment & Plan Note (Signed)
Follow-up left eye next as scheduled

## 2020-04-10 NOTE — Progress Notes (Signed)
04/10/2020     CHIEF COMPLAINT Patient presents for Retina Follow Up (5 Wk F/U OD, poss Avastin OD//Pt denies noticeable changes to [place] OU since last visit. Pt denies ocular pain, flashes of light, or floaters OU. //LBS: 160 this AM)   HISTORY OF PRESENT ILLNESS: Cameron Glover is a 70 y.o. male who presents to the clinic today for:   HPI    Retina Follow Up    Patient presents with  Diabetic Retinopathy.  In right eye.  This started 5 weeks ago.  Severity is mild.  Duration of 5 weeks.  Since onset it is stable. Additional comments: 5 Wk F/U OD, poss Avastin OD  Pt denies noticeable changes to [place] OU since last visit. Pt denies ocular pain, flashes of light, or floaters OU.   LBS: 160 this AM       Last edited by Rockie Neighbours, [place] on 04/10/2020  1:16 PM. (History)      Referring physician: [name], PA-C [address] [place],  [place] [address]  HISTORICAL INFORMATION:   Selected notes from the MEDICAL RECORD NUMBER    Lab Results  Component Value Date   HGBA1C 6.2 (H) 02/11/2020     CURRENT MEDICATIONS: No current outpatient medications on file. (Ophthalmic Drugs)   No current facility-administered medications for this visit. (Ophthalmic Drugs)   Current Outpatient Medications (Other)  Medication Sig  . Accu-Chek Softclix Lancets lancets Use as instructed to check blood sugar  . amLODipine (NORVASC) 10 MG tablet Take 1 tablet (10 mg total) by mouth daily.  Marland Kitchen atorvastatin (LIPITOR) 40 MG tablet Take 1 tablet (40 mg total) by mouth daily.  . Blood Glucose Monitoring Suppl (ACCU-CHEK GUIDE ME) w/Device KIT by Does not apply route. Sample glucometer provided in office  . glucose blood (ACCU-CHEK GUIDE) test strip Use as instructed to check blood sugar  . lisinopril-hydrochlorothiazide (ZESTORETIC) 20-12.5 MG tablet Take 1 tablet by mouth daily.  . metFORMIN (GLUCOPHAGE) 1000 MG tablet TAKE 1 TABLET (1,000 MG TOTAL) BY MOUTH 2 (TWO) TIMES DAILY WITH A MEAL.   . pantoprazole (PROTONIX) 40 MG tablet TAKE 1 TABLET BY MOUTH EVERY DAY   No current facility-administered medications for this visit. (Other)      REVIEW OF SYSTEMS:    ALLERGIES No Known Allergies  PAST MEDICAL HISTORY Past Medical History:  Diagnosis Date  . Acute renal failure (ARF) ([place])   . AKI (acute kidney injury) ([place]) 09/02/2017  . Anemia due to chronic kidney disease 04/15/2018  . DM (diabetes mellitus) with complications ([place]) 06/25/2977  . Elevated lipase 04/15/2018  . Hyperglycemia without ketosis   . Hyperlipidemia   . Hypertension    Past Surgical History:  Procedure Laterality Date  . COLONOSCOPY WITH PROPOFOL N/A 01/15/2019   Procedure: COLONOSCOPY WITH PROPOFOL;  Surgeon: Jonathon Bellows, MD;  Location: [facility] ENDOSCOPY;  Service: Gastroenterology;  Laterality: N/A;    FAMILY HISTORY Family History  Problem Relation Age of Onset  . Kidney failure Sister   . Cancer Sister   . Diabetes Sister   . Hypertension Sister   . Hyperlipidemia Sister   . Heart disease Sister   . Kidney disease Sister   . Cancer Brother   . CAD Brother   . Diabetes Brother   . Hypertension Brother   . Hyperlipidemia Brother   . Heart disease Brother   . Diabetes Other   . Cancer Mother     SOCIAL HISTORY Social History   Tobacco  Use  . Smoking status: Never Smoker  . Smokeless tobacco: Never Used  Vaping Use  . Vaping Use: Never used  Substance Use Topics  . Alcohol use: Never  . Drug use: Never         OPHTHALMIC EXAM: Base Eye Exam    Visual Acuity (ETDRS)      Right Left   Dist [place] 20/25 20/40   Dist ph [place]  20/30       Tonometry (Tonopen, 1:16 PM)      Right Left   Pressure 13 15       Pupils      Pupils Dark Light Shape React APD   Right PERRL 4 3 Round Brisk None   Left PERRL 4 3 Round Brisk None       Visual Fields (Counting fingers)      Left Right    Full Full       Extraocular Movement      Right Left    Full Full       Neuro/Psych     Oriented x3: Yes   Mood/Affect: Normal       Dilation    Right eye: 1.0% Mydriacyl, 2.5% Phenylephrine @ 1:20 PM        Slit Lamp and Fundus Exam    External Exam      Right Left   External Normal Normal       Slit Lamp Exam      Right Left   Lids/Lashes Normal Normal   Conjunctiva/Sclera White and quiet White and quiet   Cornea Clear Clear   Anterior Chamber Deep and quiet Deep and quiet   Iris Round and reactive Round and reactive   Lens 2.5+ Nuclear sclerosis 3+ Nuclear sclerosis   Anterior Vitreous Normal Normal       Fundus Exam      Right Left   Posterior Vitreous Normal    Disc Normal    C/D Ratio 0.5    Macula Microaneurysms, Mild clinically significant macular edema    Vessels NPDR severe,     Periphery Cotton wool spots            IMAGING AND PROCEDURES  Imaging and Procedures for 04/10/20  OCT, Retina - OU - Both Eyes       Right Eye Quality was good. Scan locations included subfoveal. Central Foveal Thickness: 307. Progression has improved. Findings include abnormal foveal contour, vitreomacular adhesion .   Left Eye Quality was good. Scan locations included subfoveal. Central Foveal Thickness: 308. Progression has improved. Findings include vitreomacular adhesion , abnormal foveal contour.   Notes OD 5 week post injection with improved center involved CSME, repeat injection OD today  OS with center involved CSME improved at 4 weeks postinjection follow-up as scheduled       Intravitreal Injection, Pharmacologic Agent - OD - Right Eye       Time Out 04/10/2020. 1:40 PM. Confirmed correct patient, procedure, site, and patient consented.   Anesthesia Topical anesthesia was used. Anesthetic medications included Akten 3.5%.   Procedure Preparation included 5% betadine to ocular surface, 10% betadine to eyelids, Tobramycin 0.3%. A 30 gauge needle was used.   Injection:  2.5 mg Bevacizumab (AVASTIN) 2.72m/0.1mL SOSY   NDC::  16109-604-54 Lot:: [id number]  Route: Intravitreal, Site: Right Eye  Post-op Post injection exam found visual acuity of at least counting fingers. The patient tolerated the procedure well. There were no complications. The patient received written and  verbal post procedure care education. Post injection medications were not given.                 ASSESSMENT/PLAN:  Severe nonproliferative diabetic retinopathy of right eye, with macular edema, associated with type 2 diabetes mellitus (HCC) CSME OD continues to improve, will repeat injection today of intravitreal Avastin, and may need peripheral PRP 1 day  Severe nonproliferative diabetic retinopathy of left eye, with macular edema, associated with type 2 diabetes mellitus ([place]) Follow-up left eye next as scheduled      ICD-10-CM   1. Severe nonproliferative diabetic retinopathy of right eye, with macular edema, associated with type 2 diabetes mellitus (HCC)  E11.3411 OCT, Retina - OU - Both Eyes    Intravitreal Injection, Pharmacologic Agent - OD - Right Eye    bevacizumab (AVASTIN) SOSY 2.5 mg  2. Severe nonproliferative diabetic retinopathy of left eye, with macular edema, associated with type 2 diabetes mellitus ([place])  Q22.2979     1.  Center involved CSME OU continuing to improve today right eye at 5 weeks follow-up we will repeat injection today and reevaluate right IN again in 8 to 9 weeks.  OD may need peripheral PRP 1 day to decrease vegF load  2.  OS follow-up as scheduled  3.  Ophthalmic Meds Ordered this visit:  Meds ordered this encounter  Medications  . bevacizumab (AVASTIN) SOSY 2.5 mg       Return in about 8 weeks (around 06/05/2020) for dilate, OD, AVASTIN OCT.  There are no Patient Instructions on file for this visit.   Explained the diagnoses, plan, and follow up with the patient and they expressed understanding.  Patient expressed understanding of the importance of proper follow up care.   Clent Demark Valon Glasscock  M.D. Diseases & Surgery of the Retina and Vitreous Retina & Diabetic [place] 04/10/20     Abbreviations: M myopia (nearsighted); A astigmatism; H hyperopia (farsighted); P presbyopia; Mrx spectacle prescription;  CTL contact lenses; OD right eye; OS left eye; OU both eyes  XT exotropia; ET esotropia; PEK punctate epithelial keratitis; PEE punctate epithelial erosions; DES dry eye syndrome; MGD meibomian gland dysfunction; ATs artificial tears; PFAT's preservative free artificial tears; [place] nuclear sclerotic cataract; PSC posterior subcapsular cataract; ERM epi-retinal membrane; PVD posterior vitreous detachment; RD retinal detachment; DM diabetes mellitus; DR diabetic retinopathy; NPDR non-proliferative diabetic retinopathy; PDR proliferative diabetic retinopathy; CSME clinically significant macular edema; DME diabetic macular edema; dbh dot blot hemorrhages; CWS cotton wool spot; POAG primary open angle glaucoma; C/D cup-to-disc ratio; HVF humphrey visual field; GVF goldmann visual field; OCT optical coherence tomography; IOP intraocular pressure; BRVO Branch retinal vein occlusion; CRVO central retinal vein occlusion; CRAO central retinal artery occlusion; BRAO branch retinal artery occlusion; RT retinal tear; SB scleral buckle; PPV pars plana vitrectomy; VH Vitreous hemorrhage; PRP panretinal laser photocoagulation; IVK intravitreal kenalog; VMT vitreomacular traction; MH Macular hole;  NVD neovascularization of the disc; NVE neovascularization elsewhere; AREDS age related eye disease study; ARMD age related macular degeneration; POAG primary open angle glaucoma; EBMD epithelial/anterior basement membrane dystrophy; ACIOL anterior chamber intraocular lens; IOL intraocular lens; PCIOL posterior chamber intraocular lens; Phaco/IOL phacoemulsification with intraocular lens placement; [place] photorefractive keratectomy; LASIK laser assisted in situ keratomileusis; HTN hypertension; DM diabetes mellitus; COPD  chronic obstructive pulmonary disease

## 2020-04-23 ENCOUNTER — Encounter (INDEPENDENT_AMBULATORY_CARE_PROVIDER_SITE_OTHER): Payer: Medicare Other | Admitting: Ophthalmology

## 2020-05-06 ENCOUNTER — Encounter (INDEPENDENT_AMBULATORY_CARE_PROVIDER_SITE_OTHER): Payer: Medicare Other | Admitting: Ophthalmology

## 2020-05-07 ENCOUNTER — Encounter (INDEPENDENT_AMBULATORY_CARE_PROVIDER_SITE_OTHER): Payer: Medicare Other | Admitting: Ophthalmology

## 2020-05-20 IMAGING — US US RENAL
1 series · 14 of 25 positions shown · non-contrast
Comparison: None.

CLINICAL DATA: Patient with acute renal failure.

EXAM:
RENAL / URINARY TRACT ULTRASOUND COMPLETE

[Series 1: us renal · 0.25mm/px · 14 of 29 slices shown]
[im 1/29]
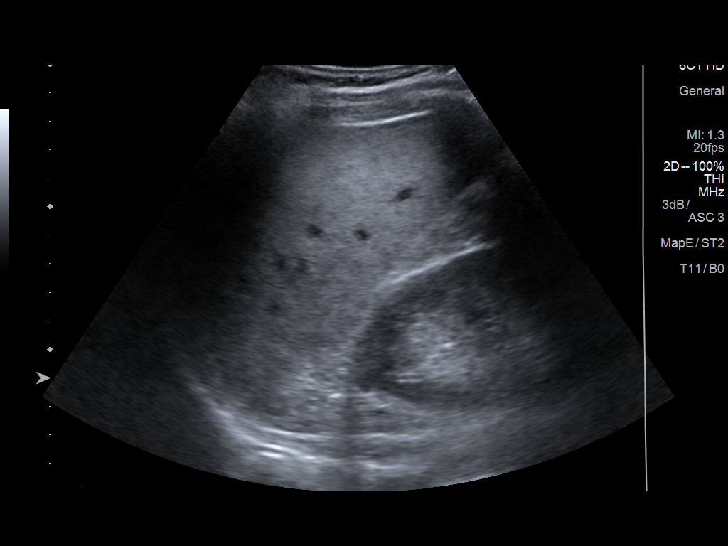
[im 3/29]
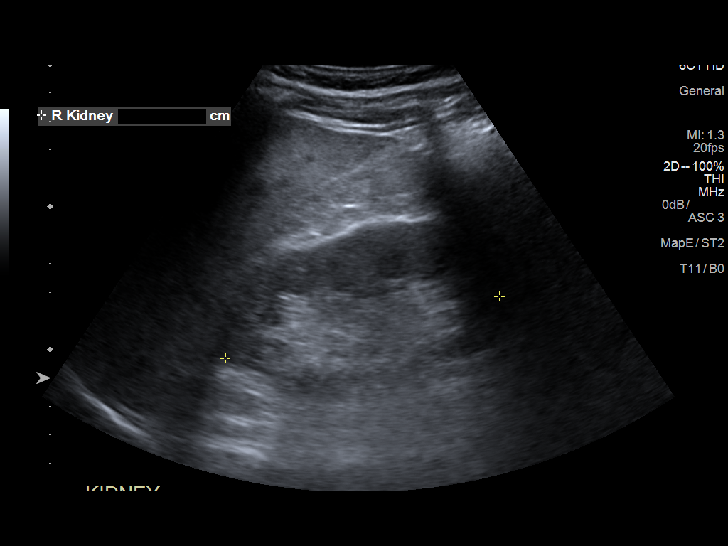
[im 5/29]
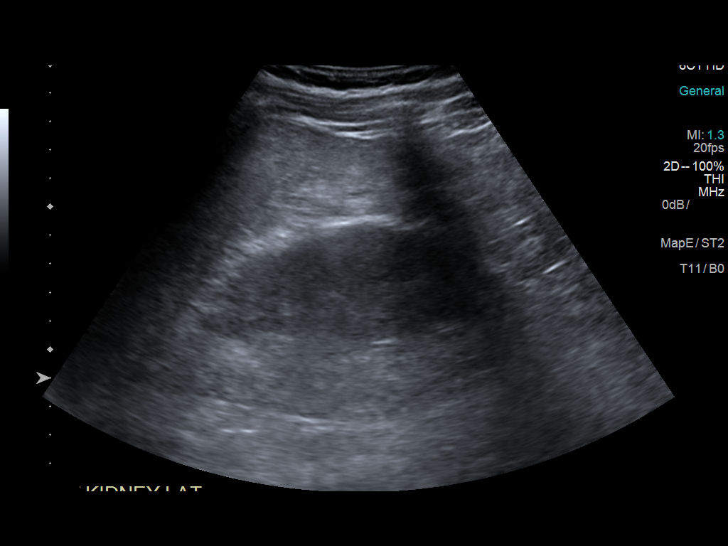
[im 8/29]
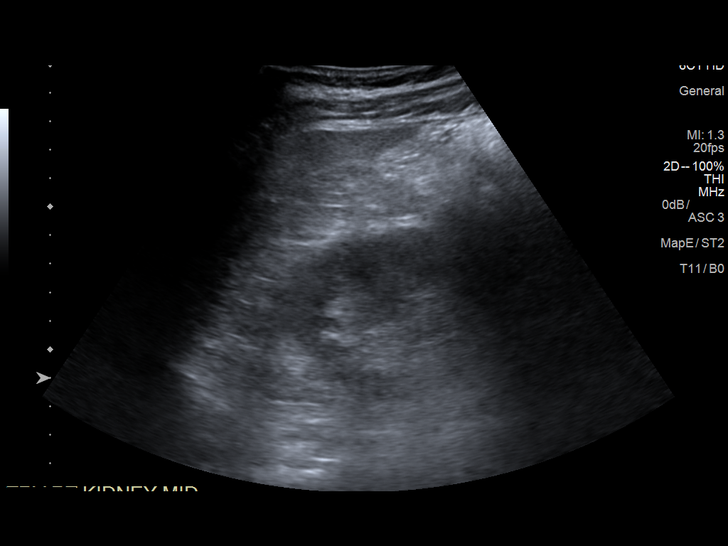
[im 10/29]
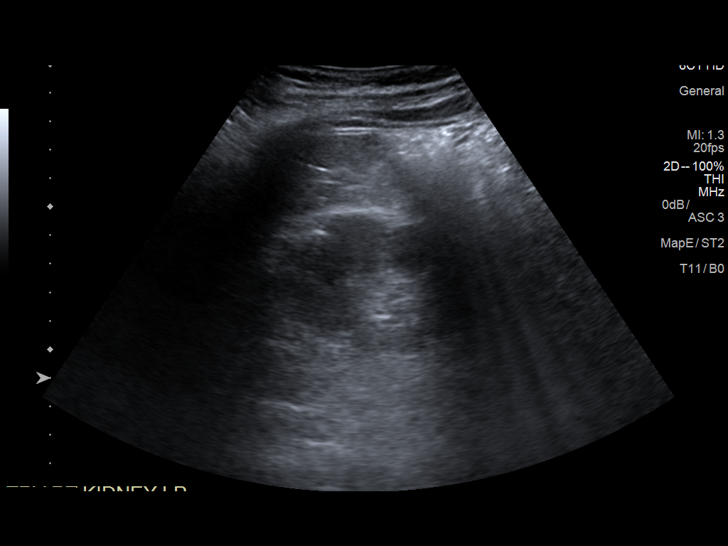
[im 11/29]
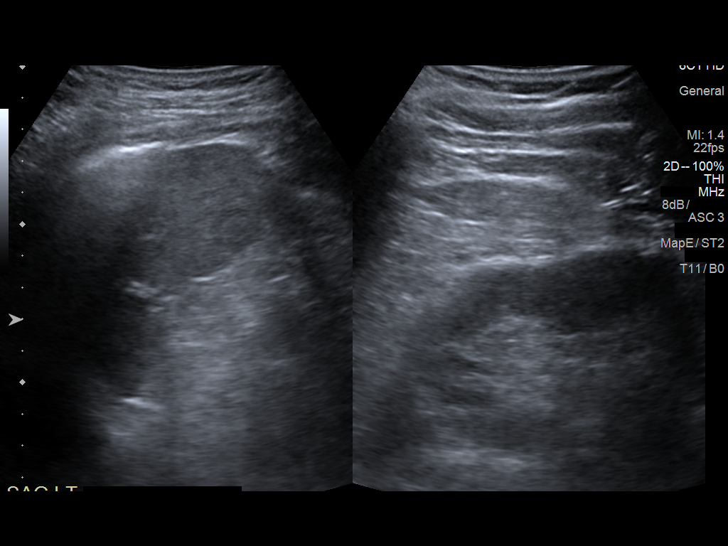
[im 13/29]
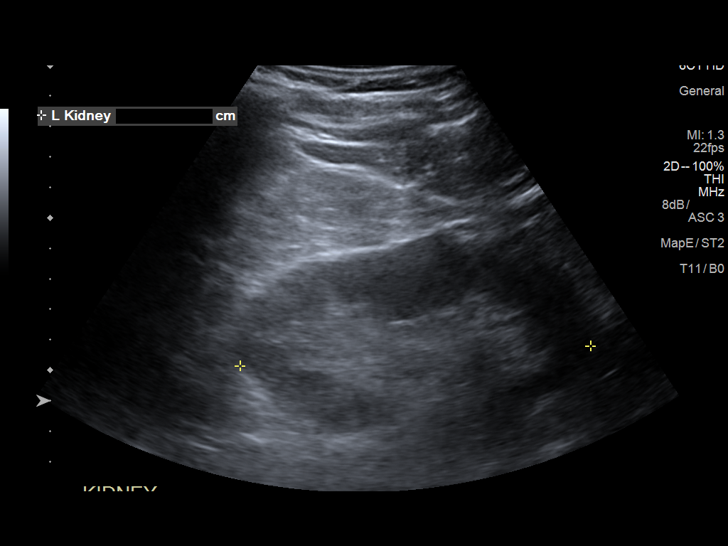
[im 16/29]
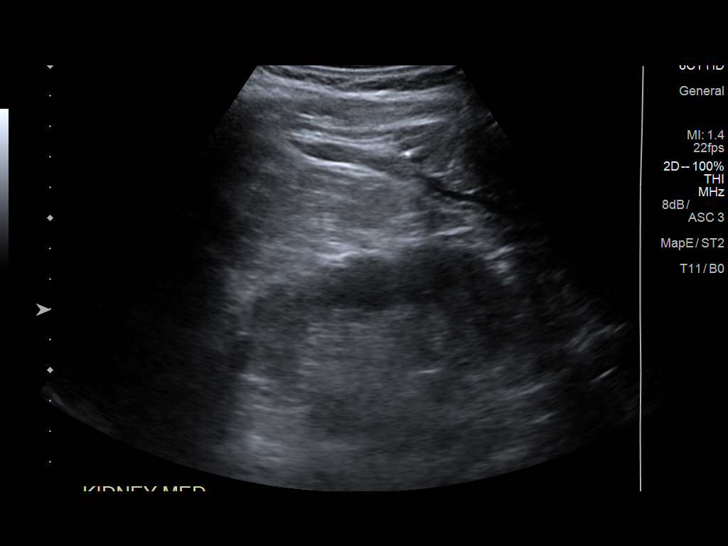
[im 18/29]
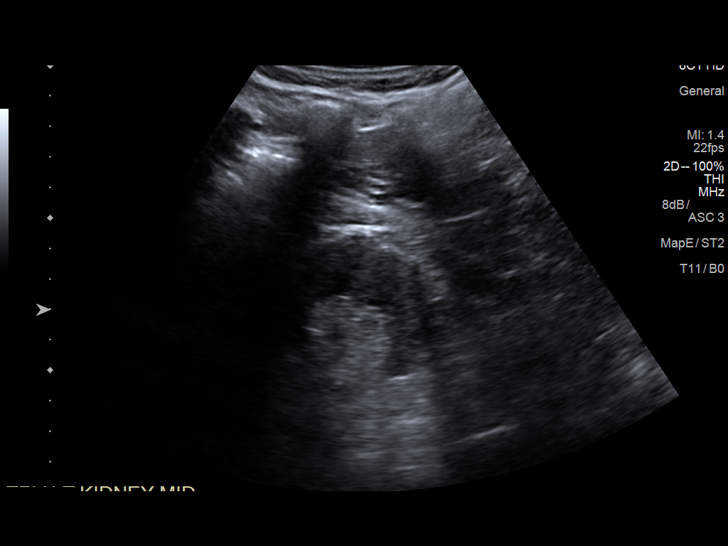
[im 19/29]
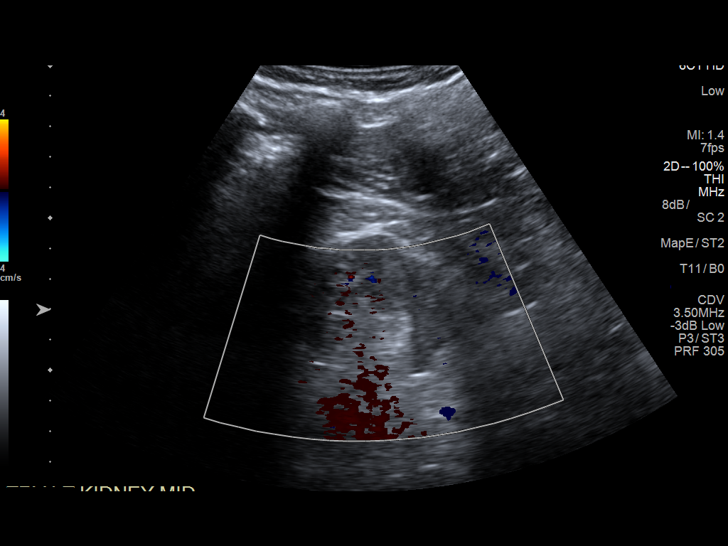
[im 22/29]
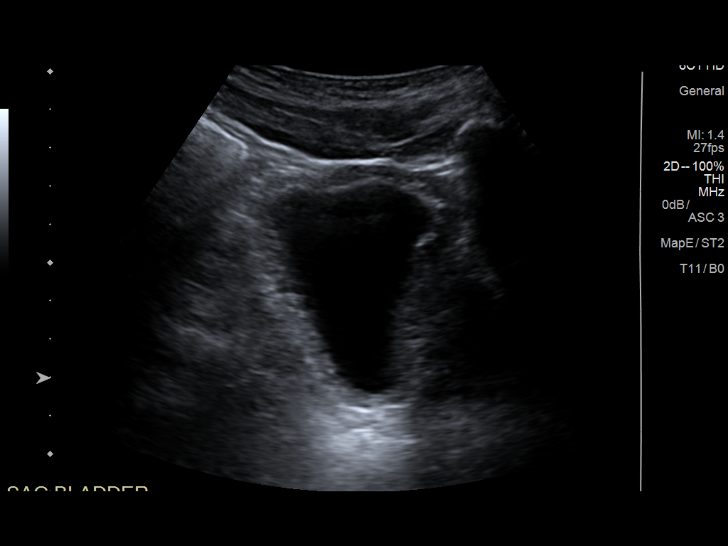
[im 24/29]
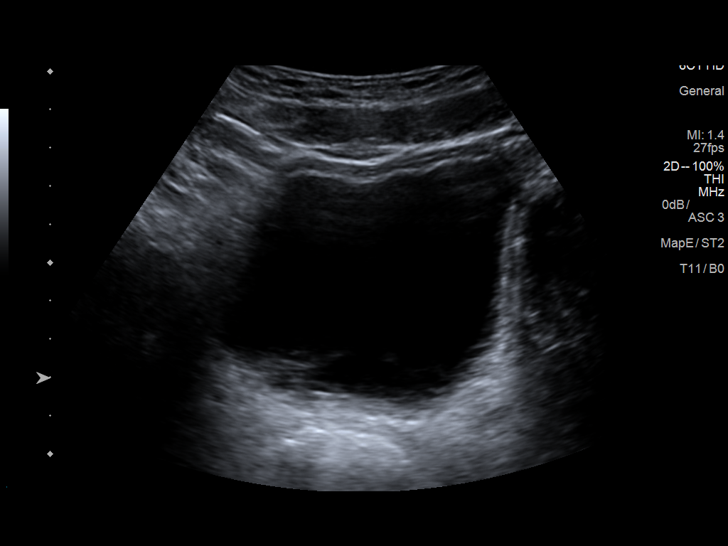
[im 26/29]
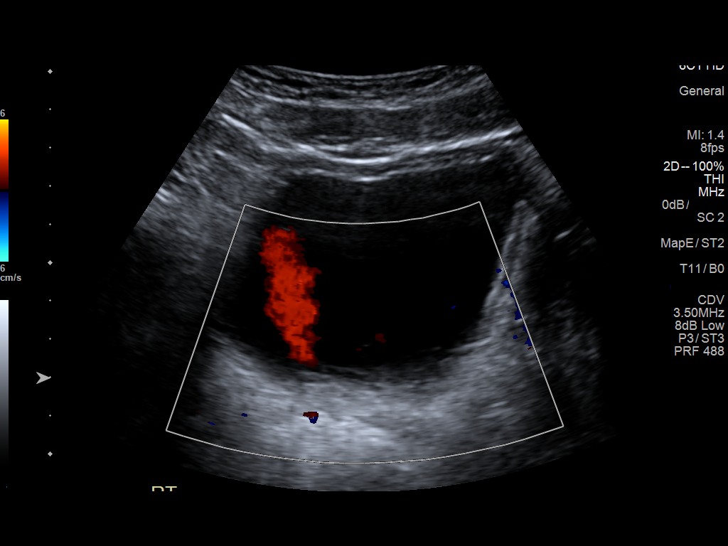
[im 29/29]
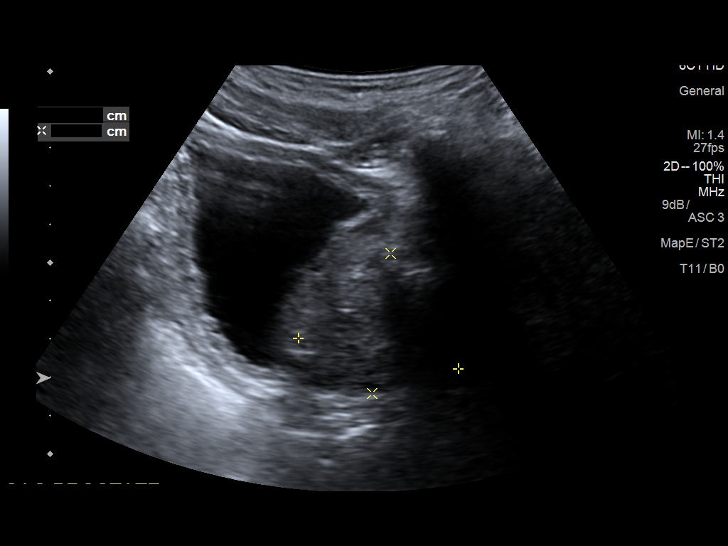

[14 of 25 positions shown; findings below may reference images not displayed]

FINDINGS: Right Kidney:

Length: 9.9 cm. Echogenicity within normal limits. No mass or
hydronephrosis visualized.

Left Kidney:

Length: 11.5 cm. Echogenicity within normal limits. No mass or
hydronephrosis visualized.

Bladder:

Mild wall thickening of the urinary bladder.  Prostate is enlarged.

Echogenic hepatic parenchyma.
IMPRESSION: No hydronephrosis.

Mild wall thickening of the urinary bladder may be secondary to
outlet obstruction. Recommend correlation with urinalysis to exclude
infection.

Hepatic steatosis.

## 2020-06-10 ENCOUNTER — Encounter (INDEPENDENT_AMBULATORY_CARE_PROVIDER_SITE_OTHER): Payer: Self-pay | Admitting: Ophthalmology

## 2020-06-10 ENCOUNTER — Other Ambulatory Visit: Payer: Self-pay

## 2020-06-10 ENCOUNTER — Ambulatory Visit (INDEPENDENT_AMBULATORY_CARE_PROVIDER_SITE_OTHER): Payer: Medicare Other | Admitting: Ophthalmology

## 2020-06-10 ENCOUNTER — Encounter (INDEPENDENT_AMBULATORY_CARE_PROVIDER_SITE_OTHER): Payer: Medicare Other | Admitting: Ophthalmology

## 2020-06-10 DIAGNOSIS — E113412 Type 2 diabetes mellitus with severe nonproliferative diabetic retinopathy with macular edema, left eye: Secondary | ICD-10-CM | POA: Diagnosis not present

## 2020-06-10 DIAGNOSIS — H2512 Age-related nuclear cataract, left eye: Secondary | ICD-10-CM | POA: Diagnosis not present

## 2020-06-10 DIAGNOSIS — E113411 Type 2 diabetes mellitus with severe nonproliferative diabetic retinopathy with macular edema, right eye: Secondary | ICD-10-CM | POA: Diagnosis not present

## 2020-06-10 DIAGNOSIS — H2511 Age-related nuclear cataract, right eye: Secondary | ICD-10-CM

## 2020-06-10 DIAGNOSIS — H43823 Vitreomacular adhesion, bilateral: Secondary | ICD-10-CM

## 2020-06-10 MED ORDER — BEVACIZUMAB 2.5 MG/0.1ML IZ SOSY
2.5000 mg | PREFILLED_SYRINGE | INTRAVITREAL | Status: AC | PRN
  Administered 2020-06-10: 2.5 mg via INTRAVITREAL

## 2020-06-10 NOTE — Assessment & Plan Note (Signed)
CSME OS continues to slowly improve we will continue to monitor

## 2020-06-10 NOTE — Assessment & Plan Note (Signed)
Physiologic and minor

## 2020-06-10 NOTE — Assessment & Plan Note (Signed)
Will need general eye exam in the coming weeks to months to monitor early cataractous changes over time

## 2020-06-10 NOTE — Assessment & Plan Note (Signed)
OD today with much less center involved CSME and improved acuity 20/20.  Currently at 8-week follow-up.  Repeat injection today and next in 10 weeks dilated

## 2020-06-10 NOTE — Progress Notes (Signed)
06/10/2020     CHIEF COMPLAINT Patient presents for Retina Follow Up (8 week fu OD, OCT and Avastin OD/Pt states [place] OU stable since last visit. Pt denies FOL, floaters, or ocular pain OU. Er[place] Luna: unknown/LBS:140)   HISTORY OF PRESENT ILLNESS: Cameron Glover is a 70 y.o. male who presents to the clinic today for:   HPI    Retina Follow Up    Diagnosis: Diabetic Retinopathy   Laterality: right eye   Onset: 8 weeks ago   Severity: mild   Duration: 8 weeks   Course: stable   Comments: 8 week fu OD, OCT and Avastin OD Pt states [place] OU stable since last visit. Pt denies FOL, floaters, or ocular pain OU.  A1C: unknown LBS:140       Last edited by Demetrios Loll, COA on 06/10/2020  1:26 PM. (History)      Referring physician: Danelle Berry, PA-C [address] [place],  [place] [address]  HISTORICAL INFORMATION:   Selected notes from the MEDICAL RECORD NUMBER    Lab Results  Component Value Date   HGBA1C 6.2 (H) 02/11/2020     CURRENT MEDICATIONS: No current outpatient medications on file. (Ophthalmic Drugs)   No current facility-administered medications for this visit. (Ophthalmic Drugs)   Current Outpatient Medications (Other)  Medication Sig  . Accu-Chek Softclix Lancets lancets Use as instructed to check blood sugar  . amLODipine (NORVASC) 10 MG tablet Take 1 tablet (10 mg total) by mouth daily.  Marland Kitchen atorvastatin (LIPITOR) 40 MG tablet Take 1 tablet (40 mg total) by mouth daily.  . Blood Glucose Monitoring Suppl (ACCU-CHEK GUIDE ME) w/Device KIT by Does not apply route. Sample glucometer provided in office  . glucose blood (ACCU-CHEK GUIDE) test strip Use as instructed to check blood sugar  . lisinopril-hydrochlorothiazide (ZESTORETIC) 20-12.5 MG tablet Take 1 tablet by mouth daily.  . metFORMIN (GLUCOPHAGE) 1000 MG tablet TAKE 1 TABLET (1,000 MG TOTAL) BY MOUTH 2 (TWO) TIMES DAILY WITH A MEAL.  . pantoprazole (PROTONIX) 40 MG tablet TAKE 1 TABLET BY MOUTH EVERY  DAY   No current facility-administered medications for this visit. (Other)      REVIEW OF SYSTEMS:    ALLERGIES No Known Allergies  PAST MEDICAL HISTORY Past Medical History:  Diagnosis Date  . Acute renal failure (ARF) (HCC)   . AKI (acute kidney injury) (HCC) 09/02/2017  . Anemia due to chronic kidney disease 04/15/2018  . DM (diabetes mellitus) with complications (HCC) 09/02/2017  . Elevated lipase 04/15/2018  . Hyperglycemia without ketosis   . Hyperlipidemia   . Hypertension    Past Surgical History:  Procedure Laterality Date  . COLONOSCOPY WITH PROPOFOL N/A 01/15/2019   Procedure: COLONOSCOPY WITH PROPOFOL;  Surgeon: Wyline Mood, MD;  Location: [facility] ENDOSCOPY;  Service: Gastroenterology;  Laterality: N/A;    FAMILY HISTORY Family History  Problem Relation Age of Onset  . Kidney failure Sister   . Cancer Sister   . Diabetes Sister   . Hypertension Sister   . Hyperlipidemia Sister   . Heart disease Sister   . Kidney disease Sister   . Cancer Brother   . CAD Brother   . Diabetes Brother   . Hypertension Brother   . Hyperlipidemia Brother   . Heart disease Brother   . Diabetes Other   . Cancer Mother     SOCIAL HISTORY Social History   Tobacco Use  . Smoking status: Never Smoker  . Smokeless tobacco: Never Used  Vaping Use  . Vaping Use: Never used  Substance Use Topics  . Alcohol use: Never  . Drug use: Never         OPHTHALMIC EXAM: Base Eye Exam    Visual Acuity (ETDRS)      Right Left   Dist [place] 20/20 -2 20/40   Dist ph [place]  NI       Tonometry (Tonopen, 1:30 PM)      Right Left   Pressure 15 16       Pupils      Pupils Dark Light Shape React APD   Right PERRL 4 3 Round Brisk None   Left PERRL 4 3 Round Brisk None       Visual Fields (Counting fingers)      Left Right    Full Full       Extraocular Movement      Right Left    Full Full       Neuro/Psych    Oriented x3: Yes   Mood/Affect: Normal       Dilation     Right eye: 1.0% Mydriacyl, 2.5% Phenylephrine @ 1:30 PM        Slit Lamp and Fundus Exam    External Exam      Right Left   External Normal Normal       Slit Lamp Exam      Right Left   Lids/Lashes Normal Normal   Conjunctiva/Sclera White and quiet White and quiet   Cornea Clear Clear   Anterior Chamber Deep and quiet Deep and quiet   Iris Round and reactive Round and reactive   Lens 2.5+ Nuclear sclerosis 3+ Nuclear sclerosis   Anterior Vitreous Normal Normal       Fundus Exam      Right Left   Posterior Vitreous Normal    Disc Normal    C/D Ratio 0.5    Macula Microaneurysms, Mild clinically significant macular edema    Vessels NPDR severe,     Periphery Cotton wool spots            IMAGING AND PROCEDURES  Imaging and Procedures for 06/10/20  OCT, Retina - OU - Both Eyes       Right Eye Quality was good. Scan locations included subfoveal. Central Foveal Thickness: 291. Progression has improved. Findings include abnormal foveal contour, vitreomacular adhesion .   Left Eye Quality was good. Scan locations included subfoveal. Central Foveal Thickness: 319. Progression has improved. Findings include vitreomacular adhesion , abnormal foveal contour.   Notes OD 8 week post injection with improved center involved CSME, repeat injection OD today  OS with center involved CSME improved at 12 weeks postinjection follow-up as scheduled, may simply observe OS       Intravitreal Injection, Pharmacologic Agent - OD - Right Eye       Time Out 06/10/2020. 2:35 PM. Confirmed correct patient, procedure, site, and patient consented.   Anesthesia Topical anesthesia was used. Anesthetic medications included Akten 3.5%.   Procedure Preparation included 5% betadine to ocular surface, 10% betadine to eyelids, Tobramycin 0.3%. A 30 gauge needle was used.   Injection:  2.5 mg Bevacizumab (AVASTIN) 2.5mg /0.84mL SOSY   NDC: 44315-400-86, Lot: [id number]   Route: Intravitreal,  Site: Right Eye  Post-op Post injection exam found visual acuity of at least counting fingers. The patient tolerated the procedure well. There were no complications. The patient received written and verbal post procedure care education. Post injection medications were  not given.                 ASSESSMENT/PLAN:  Nuclear sclerotic cataract of right eye Will need general eye exam in the coming weeks to months to monitor early cataractous changes over time  Nuclear sclerotic cataract of left eye Will need general eye exam in the coming weeks to months to monitor early cataractous changes over time  Severe nonproliferative diabetic retinopathy of left eye, with macular edema, associated with type 2 diabetes mellitus ([place]) CSME OS continues to slowly improve we will continue to monitor  Severe nonproliferative diabetic retinopathy of right eye, with macular edema, associated with type 2 diabetes mellitus ([place]) OD today with much less center involved CSME and improved acuity 20/20.  Currently at 8-week follow-up.  Repeat injection today and next in 10 weeks dilated  Vitreomacular adhesion of both eyes Physiologic and minor      ICD-10-CM   1. Severe nonproliferative diabetic retinopathy of right eye, with macular edema, associated with type 2 diabetes mellitus (HCC)  E11.3411 OCT, Retina - OU - Both Eyes    Intravitreal Injection, Pharmacologic Agent - OD - Right Eye    bevacizumab (AVASTIN) SOSY 2.5 mg  2. Nuclear sclerotic cataract of right eye  H25.11   3. Nuclear sclerotic cataract of left eye  H25.12   4. Severe nonproliferative diabetic retinopathy of left eye, with macular edema, associated with type 2 diabetes mellitus ([place])  D62.2297   5. Vitreomacular adhesion of both eyes  H43.823     1.  CSME OD has continued slow steady improvement and improved acuity as well.  Now at 8-week follow-up post Avastin.  We will repeat injection today and examination next 10 weeks  2.   Dilate OU next in 10 weeks  3.  Ophthalmic Meds Ordered this visit:  Meds ordered this encounter  Medications  . bevacizumab (AVASTIN) SOSY 2.5 mg       Return in about 10 weeks (around 08/19/2020) for DILATE OU, AVASTIN OCT, OD.  There are no Patient Instructions on file for this visit.   Explained the diagnoses, plan, and follow up with the patient and they expressed understanding.  Patient expressed understanding of the importance of proper follow up care.   Clent Demark Kriya Westra M.D. Diseases & Surgery of the Retina and Vitreous Retina & Diabetic [place] 06/10/20     Abbreviations: M myopia (nearsighted); A astigmatism; H hyperopia (farsighted); P presbyopia; Mrx spectacle prescription;  CTL contact lenses; OD right eye; OS left eye; OU both eyes  XT exotropia; ET esotropia; PEK punctate epithelial keratitis; PEE punctate epithelial erosions; DES dry eye syndrome; MGD meibomian gland dysfunction; ATs artificial tears; PFAT's preservative free artificial tears; [place] nuclear sclerotic cataract; PSC posterior subcapsular cataract; ERM epi-retinal membrane; PVD posterior vitreous detachment; RD retinal detachment; DM diabetes mellitus; DR diabetic retinopathy; NPDR non-proliferative diabetic retinopathy; PDR proliferative diabetic retinopathy; CSME clinically significant macular edema; DME diabetic macular edema; dbh dot blot hemorrhages; CWS cotton wool spot; POAG primary open angle glaucoma; C/D cup-to-disc ratio; HVF humphrey visual field; GVF goldmann visual field; OCT optical coherence tomography; IOP intraocular pressure; BRVO Branch retinal vein occlusion; CRVO central retinal vein occlusion; CRAO central retinal artery occlusion; BRAO branch retinal artery occlusion; RT retinal tear; SB scleral buckle; PPV pars plana vitrectomy; VH Vitreous hemorrhage; PRP panretinal laser photocoagulation; IVK intravitreal kenalog; VMT vitreomacular traction; MH Macular hole;  NVD neovascularization of  the disc; NVE neovascularization elsewhere; AREDS age related eye disease study;  ARMD age related macular degeneration; POAG primary open angle glaucoma; EBMD epithelial/anterior basement membrane dystrophy; ACIOL anterior chamber intraocular lens; IOL intraocular lens; PCIOL posterior chamber intraocular lens; Phaco/IOL phacoemulsification with intraocular lens placement; [place] photorefractive keratectomy; LASIK laser assisted in situ keratomileusis; HTN hypertension; DM diabetes mellitus; COPD chronic obstructive pulmonary disease

## 2020-06-11 ENCOUNTER — Other Ambulatory Visit: Payer: Self-pay | Admitting: Family Medicine

## 2020-06-11 DIAGNOSIS — E119 Type 2 diabetes mellitus without complications: Secondary | ICD-10-CM

## 2020-06-11 DIAGNOSIS — R1013 Epigastric pain: Secondary | ICD-10-CM

## 2020-07-02 ENCOUNTER — Encounter (INDEPENDENT_AMBULATORY_CARE_PROVIDER_SITE_OTHER): Payer: Self-pay

## 2020-07-24 ENCOUNTER — Other Ambulatory Visit: Payer: Self-pay | Admitting: Family Medicine

## 2020-07-24 DIAGNOSIS — E119 Type 2 diabetes mellitus without complications: Secondary | ICD-10-CM

## 2020-08-11 ENCOUNTER — Encounter: Payer: Self-pay | Admitting: Family Medicine

## 2020-08-11 ENCOUNTER — Ambulatory Visit (INDEPENDENT_AMBULATORY_CARE_PROVIDER_SITE_OTHER): Payer: Medicare Other | Admitting: Family Medicine

## 2020-08-11 ENCOUNTER — Other Ambulatory Visit: Payer: Self-pay

## 2020-08-11 VITALS — BP 124/78 | HR 96 | Temp 98.5°F | Resp 16 | Ht 65.0 in | Wt 176.9 lb

## 2020-08-11 DIAGNOSIS — N2581 Secondary hyperparathyroidism of renal origin: Secondary | ICD-10-CM

## 2020-08-11 DIAGNOSIS — E559 Vitamin D deficiency, unspecified: Secondary | ICD-10-CM

## 2020-08-11 DIAGNOSIS — E785 Hyperlipidemia, unspecified: Secondary | ICD-10-CM

## 2020-08-11 DIAGNOSIS — E1122 Type 2 diabetes mellitus with diabetic chronic kidney disease: Secondary | ICD-10-CM

## 2020-08-11 DIAGNOSIS — K219 Gastro-esophageal reflux disease without esophagitis: Secondary | ICD-10-CM

## 2020-08-11 DIAGNOSIS — E538 Deficiency of other specified B group vitamins: Secondary | ICD-10-CM

## 2020-08-11 DIAGNOSIS — Z23 Encounter for immunization: Secondary | ICD-10-CM

## 2020-08-11 DIAGNOSIS — R0609 Other forms of dyspnea: Secondary | ICD-10-CM

## 2020-08-11 DIAGNOSIS — E113411 Type 2 diabetes mellitus with severe nonproliferative diabetic retinopathy with macular edema, right eye: Secondary | ICD-10-CM

## 2020-08-11 DIAGNOSIS — E1129 Type 2 diabetes mellitus with other diabetic kidney complication: Secondary | ICD-10-CM

## 2020-08-11 DIAGNOSIS — I10 Essential (primary) hypertension: Secondary | ICD-10-CM | POA: Diagnosis not present

## 2020-08-11 DIAGNOSIS — N182 Chronic kidney disease, stage 2 (mild): Secondary | ICD-10-CM

## 2020-08-11 DIAGNOSIS — R809 Proteinuria, unspecified: Secondary | ICD-10-CM

## 2020-08-11 DIAGNOSIS — E349 Endocrine disorder, unspecified: Secondary | ICD-10-CM

## 2020-08-11 DIAGNOSIS — D649 Anemia, unspecified: Secondary | ICD-10-CM

## 2020-08-11 DIAGNOSIS — I517 Cardiomegaly: Secondary | ICD-10-CM

## 2020-08-11 DIAGNOSIS — E113412 Type 2 diabetes mellitus with severe nonproliferative diabetic retinopathy with macular edema, left eye: Secondary | ICD-10-CM | POA: Diagnosis not present

## 2020-08-11 DIAGNOSIS — Z5181 Encounter for therapeutic drug level monitoring: Secondary | ICD-10-CM

## 2020-08-11 DIAGNOSIS — R0600 Dyspnea, unspecified: Secondary | ICD-10-CM

## 2020-08-11 DIAGNOSIS — E1169 Type 2 diabetes mellitus with other specified complication: Secondary | ICD-10-CM

## 2020-08-11 DIAGNOSIS — R7989 Other specified abnormal findings of blood chemistry: Secondary | ICD-10-CM

## 2020-08-11 NOTE — Patient Instructions (Addendum)
Cameron Grooms, MD  02/15/2019 3:49 PM    [facility], [place], [place]  [address] Phone: [phone]; Fax: [phone]   Call your cardiologist and get a follow up appointment, they had wanted to recheck you about one year ago (appointment was Feb 2021 - wanted 6 month follow up)  Vitamin D Deficiency Vitamin D deficiency is when your body does not have enough vitamin D. Vitamin D is important to your body because: It helps your body use other minerals. It helps to keep your bones strong and healthy. It may help to prevent some diseases. It helps your heart and other muscles work well. Not getting enough vitamin D can make your bones soft. It can also cause otherhealth problems. What are the causes? This condition may be caused by: Not eating enough foods that contain vitamin D. Not getting enough sun. Having diseases that make it hard for your body to absorb vitamin D. Having a surgery in which a part of the stomach or a part of the small intestine is removed. Having kidney disease or liver disease. What increases the risk? You are more likely to get this condition if: You are older. You do not spend much time outdoors. You live in a nursing home. You have had broken bones. You have weak or thin bones (osteoporosis). You have a disease or condition that changes how your body absorbs vitamin D. You have dark skin. You take certain medicines. You are overweight or obese. What are the signs or symptoms? In mild cases, there may not be any symptoms. If the condition is very bad, symptoms may include: Bone pain. Muscle pain. Falling often. Broken bones caused by a minor injury. How is this treated? Treatment may include taking supplements as told by your doctor. Your doctor will tell you what dose is best for you. Supplements may include: Vitamin D. Calcium. Follow these instructions at home: Eating and drinking  Eat foods that  contain vitamin D, such as: Dairy products, cereals, or juices with added vitamin D. Check the label. Fish, such as salmon or trout. Eggs. Oysters. Mushrooms. The items listed above may not be a complete list of what you can eat and drink. Contact a dietitian for more options. General instructions Take medicines and supplements only as told by your doctor. Get regular, safe exposure to natural sunlight. Do not use a tanning bed. Maintain a healthy weight. Lose weight if needed. Keep all follow-up visits as told by your doctor. This is important. How is this prevented? You can get vitamin D by: Eating foods that naturally contain vitamin D. Eating or drinking products that have vitamin D added to them, such as cereals, juices, and milk. Taking vitamin D or a multivitamin that contains vitamin D. Being in the sun. Your body makes vitamin D when your skin is exposed to sunlight. Your body changes the sunlight into a form of the vitamin that it can use. Contact a doctor if: Your symptoms do not go away. You feel sick to your stomach (nauseous). You throw up (vomit). You poop less often than normal, or you have trouble pooping (constipation). Summary Vitamin D deficiency is when your body does not have enough vitamin D. Vitamin D helps to keep your bones strong and healthy. This condition is often treated by taking a supplement. Your doctor will tell you what dose is best for you. This information is not intended to replace advice given to you by  your health care provider. Make sure you discuss any questions you have with your healthcare provider. Document Revised: 08/29/2017 Document Reviewed: 08/29/2017 Elsevier Patient Education  2022 [employer].    Vitamin B12 Deficiency Vitamin B12 deficiency means that your body does not have enough vitamin B12. The body needs this vitamin: To make red blood cells. To make genes (DNA). To help the nerves work. If you do not have enough  vitamin B12 in your body, you can have healthproblems. What are the causes? Not eating enough foods that contain vitamin B12. Not being able to absorb vitamin B12 from the food that you eat. Certain digestive system diseases. A condition in which the body does not make enough of a certain protein, which results in too few red blood cells (pernicious anemia). Having a surgery in which part of the stomach or small intestine is removed. Taking medicines that make it hard for the body to absorb vitamin B12. These medicines include: Heartburn medicines. Some antibiotic medicines. Other medicines that are used to treat certain conditions. What increases the risk? Being older than age 11. Eating a vegetarian or vegan diet, especially while you are pregnant. Eating a poor diet while you are pregnant. Taking certain medicines. Having alcoholism. What are the signs or symptoms? In some cases, there are no symptoms. If the condition leads to too few blood cells or nerve damage, symptoms can occur, such as: Feeling weak. Feeling tired (fatigued). Not being hungry. Weight loss. A loss of feeling (numbness) or tingling in your hands and feet. Redness and burning of the tongue. Being mixed up (confused) or having memory problems. Sadness (depression). Problems with your senses. This can include color blindness, ringing in the ears, or loss of taste. Watery poop (diarrhea) or trouble pooping (constipation). Trouble walking. If anemia is very bad, symptoms can include: Being short of breath. Being dizzy. Having a very fast heartbeat. How is this treated? Changing the way you eat and drink, such as: Eating more foods that contain vitamin B12. Drinking little or no alcohol. Getting vitamin B12 shots. Taking vitamin B12 supplements. Your doctor will tell you the dose that is best for you. Follow these instructions at home: Eating and drinking  Eat lots of healthy foods that contain vitamin  B12. These include: Meats and poultry, such as beef, pork, chicken, [place], and organ meats, such as liver. Seafood, such as clams, rainbow trout, salmon, tuna, and haddock. Eggs. Cereal and dairy products that have vitamin B12 added to them. Check the label. The items listed above may not be a complete list of what you can eat and drink. Contact a dietitian for more options. General instructions Get any shots as told by your doctor. Take supplements only as told by your doctor. Do not drink alcohol if your doctor tells you not to. In some cases, you may only be asked to limit alcohol use. Keep all follow-up visits as told by your doctor. This is important. Contact a doctor if: Your symptoms come back. Get help right away if: You have trouble breathing. You have a very fast heartbeat. You have chest pain. You get dizzy. You pass out. Summary Vitamin B12 deficiency means that your body is not getting enough vitamin B12. In some cases, there are no symptoms of this condition. Treatment may include making a change in the way you eat and drink, getting vitamin B12 shots, or taking supplements. Eat lots of healthy foods that contain vitamin B12. This information is not intended to  replace advice given to you by your health care provider. Make sure you discuss any questions you have with your healthcare provider. Document Revised: 08/30/2017 Document Reviewed: 08/30/2017 Elsevier Patient Education  2022 [employer].

## 2020-08-11 NOTE — Progress Notes (Signed)
Name: Cameron Glover   MRN: [id number]    DOB: 08/17/1950   Date:08/11/2020       Progress Note  Chief Complaint  Patient presents with   Diabetes   Hyperlipidemia   Hypertension     Subjective:   Cameron Glover is a 70 y.o. male, presents to clinic for routine f/up  Hypertension:  Currently managed on amlodipine 10 mg, lisinopril-hctz 20-12.5 Pt reports good med compliance and denies any SE.   Blood pressure today is well controlled. BP Readings from Last 3 Encounters:  08/11/20 124/78  04/08/20 (!) 150/88  03/25/20 134/86   Pt denies CP, SOB, exertional sx, LE edema, palpitation, Ha's, visual disturbances, lightheadedness, hypotension, syncope. Dietary efforts for BP?  Trying to be healthy, low salt    DM:   Pt managing DM with metformin Reports good med compliance Pt has no SE from meds. Blood sugars - was 144 this am - lows 90's no highs Denies: Polyuria, polydipsia, vision changes, neuropathy, hypoglycemia Recent pertinent labs: Lab Results  Component Value Date   HGBA1C 6.2 (H) 02/11/2020   HGBA1C 6.4 (H) 07/23/2019   HGBA1C 6.9 (H) 02/12/2019   Standard of care and health maintenance: Urine Microalbumin:  n/a Foot exam:  due DM eye exam:  per ophtho - retinopathy ACEI/ARB:  yes Statin:  yes  HLD - on lipitor 40 mg, lipid panel done 6 months ago, LDL at goal Lab Results  Component Value Date   CHOL 103 02/11/2020   HDL 35 (L) 02/11/2020   LDLCALC 53 02/11/2020   TRIG 73 02/11/2020   CHOLHDL 2.9 02/11/2020   No cp, claudication, no myalgias or med SE, good compliance  Hx of renal disease in family: Renal function recent labs:  Lab Results  Component Value Date   GFRAA 58 (L) 02/11/2020   GFRAA 72 07/23/2019   GFRAA 74 03/23/2019   Lab Results  Component Value Date   CREATININE 1.38 (H) 03/25/2020   BUN 21 03/25/2020   NA 139 03/25/2020   K 4.4 03/25/2020   CL 106 03/25/2020   CO2 24 03/25/2020     Chemistry      Component Value  Date/Time   NA 139 03/25/2020 1405   K 4.4 03/25/2020 1405   CL 106 03/25/2020 1405   CO2 24 03/25/2020 1405   BUN 21 03/25/2020 1405   CREATININE 1.38 (H) 03/25/2020 1405      Component Value Date/Time   CALCIUM 8.6 03/25/2020 1405   CALCIUM 8.6 03/25/2020 1405   ALKPHOS 77 09/02/2017 1738   AST 13 03/25/2020 1405   ALT 14 03/25/2020 1405   BILITOT 0.4 03/25/2020 1405     Last labs renal function slightly worse than his baseline, previously did consult with nephrology   GERD - on protonix - sensitive GI system, prior GI illness/infection causing AKI hospitalization weight loss  Calcium was abnormal and Vit d low, also B12 low With anemia Iron panel was done and normal Hemoglobin  Date Value Ref Range Status  03/25/2020 11.2 (L) 13.2 - 17.1 g/dL Final  02/11/2020 10.5 (L) 13.2 - 17.1 g/dL Final  02/12/2019 12.7 (L) 13.2 - 17.1 g/dL Final  09/22/2018 13.9 13.2 - 17.1 g/dL Final   Last vitamin D Lab Results  Component Value Date   VD25OH 13 (L) 03/25/2020   Lab Results  Component Value Date   VITAMINB12 288 03/25/2020       Current Outpatient Medications:    Accu-Chek Softclix Lancets lancets,  Use as instructed to check blood sugar, Disp: 100 each, Rfl: 12   amLODipine (NORVASC) 10 MG tablet, Take 1 tablet (10 mg total) by mouth daily., Disp: 90 tablet, Rfl: 3   atorvastatin (LIPITOR) 40 MG tablet, TAKE 1 TABLET BY MOUTH EVERY DAY, Disp: 90 tablet, Rfl: 3   Blood Glucose Monitoring Suppl (ACCU-CHEK GUIDE ME) w/Device KIT, by Does not apply route. Sample glucometer provided in office, Disp: , Rfl:    glucose blood (ACCU-CHEK GUIDE) test strip, Use as instructed to check blood sugar, Disp: 100 each, Rfl: 12   lisinopril-hydrochlorothiazide (ZESTORETIC) 20-12.5 MG tablet, Take 1 tablet by mouth daily., Disp: 90 tablet, Rfl: 3   metFORMIN (GLUCOPHAGE) 1000 MG tablet, TAKE 1 TABLET BY MOUTH TWICE A DAY WITH A MEAL, Disp: 180 tablet, Rfl: 2   pantoprazole (PROTONIX) 40 MG  tablet, TAKE 1 TABLET BY MOUTH EVERY DAY, Disp: 90 tablet, Rfl: 3   Prednisol Ace-Moxiflox-Bromfen 1-0.5-0.075 % SUSP, Place 1 drop into the left eye 4 (four) times daily., Disp: , Rfl:   Patient Active Problem List   Diagnosis Date Noted   Anemia 08/11/2020   Gastroesophageal reflux disease 08/11/2020   Vitamin B12 deficiency 08/11/2020   Vitamin D deficiency 08/11/2020   Elevated parathyroid hormone 08/11/2020   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   Severe nonproliferative diabetic retinopathy of left eye, with macular edema, associated with type 2 diabetes mellitus ([place]) 05/14/2019   Severe nonproliferative diabetic retinopathy of right eye, with macular edema, associated with type 2 diabetes mellitus ([place]) 05/14/2019   Retinal hemorrhage of right eye 05/14/2019   Retinal hemorrhage of left eye 05/14/2019   Nuclear sclerotic cataract of right eye 05/14/2019   Nuclear sclerotic cataract of left eye 05/14/2019   LVH (left ventricular hypertrophy) 03/23/2019   Hyperlipidemia associated with type 2 diabetes mellitus ([place]) 09/22/2018   CKD stage 2 due to type 2 diabetes mellitus ([place]) 09/22/2018   Essential hypertension 04/15/2018    Past Surgical History:  Procedure Laterality Date   COLONOSCOPY WITH PROPOFOL N/A 01/15/2019   Procedure: COLONOSCOPY WITH PROPOFOL;  Surgeon: Jonathon Bellows, MD;  Location: [facility] ENDOSCOPY;  Service: Gastroenterology;  Laterality: N/A;    Family History  Problem Relation Age of Onset   Kidney failure Sister    Cancer Sister    Diabetes Sister    Hypertension Sister    Hyperlipidemia Sister    Heart disease Sister    Kidney disease Sister    Cancer Brother    CAD Brother    Diabetes Brother    Hypertension Brother    Hyperlipidemia Brother    Heart disease Brother    Diabetes Other    Cancer Mother     Social History   Tobacco Use   Smoking status: Never   Smokeless tobacco: Never  Vaping Use   Vaping Use: Never used  Substance  Use Topics   Alcohol use: Never   Drug use: Never     No Known Allergies  Health Maintenance  Topic Date Due   Zoster Vaccines- Shingrix (1 of 2) Never done   COVID-19 Vaccine (4 - Booster for Pfizer series) 05/14/2020   FOOT EXAM  07/22/2020   HEMOGLOBIN A1C  08/10/2020   INFLUENZA VACCINE  10/21/2020 (Originally 08/04/2020)   TETANUS/TDAP  01/04/2021 (Originally 11/14/1969)   OPHTHALMOLOGY EXAM  03/13/2021   PNA vac Low Risk Adult (2 of 2 - PPSV23) 09/04/2022   COLONOSCOPY (Pts 45-55yr Insurance coverage will need to be confirmed)  01/15/2024   Hepatitis C Screening  Completed   HPV VACCINES  Aged Out    Chart Review Today: I personally reviewed active problem list, medication list, allergies, family history, social history, health maintenance, notes from last encounter, lab results, imaging with the patient/caregiver today.   Review of Systems  Constitutional: Negative.   HENT: Negative.    Eyes: Negative.   Respiratory: Negative.    Cardiovascular: Negative.   Gastrointestinal: Negative.   Endocrine: Negative.   Genitourinary: Negative.   Musculoskeletal: Negative.   Skin: Negative.   Allergic/Immunologic: Negative.   Neurological: Negative.   Hematological: Negative.   Psychiatric/Behavioral: Negative.    All other systems reviewed and are negative.   Objective:   Vitals:   08/11/20 0912  BP: 124/78  Pulse: 96  Resp: 16  Temp: 98.5 F (36.9 C)  SpO2: 98%  Weight: 176 lb 14.4 oz (80.2 kg)  Height: _0  (1.651 m)    Body mass index is 29.44 kg/m.  Physical Exam Vitals and nursing note reviewed.  Constitutional:      General: He is not in acute distress.    Appearance: Normal appearance. He is well-developed, well-groomed and overweight. He is not ill-appearing, toxic-appearing or diaphoretic.     Interventions: Face mask in place.     Comments: Elderly male, well appearing, appears stated age  HENT:     Head: Normocephalic and atraumatic.      Jaw: No trismus.     Right Ear: External ear normal.     Left Ear: External ear normal.  Eyes:     General: Lids are normal. No scleral icterus.       Right eye: No discharge.        Left eye: No discharge.     Conjunctiva/sclera: Conjunctivae normal.  Neck:     Trachea: Trachea and phonation normal. No tracheal deviation.  Cardiovascular:     Rate and Rhythm: Normal rate and regular rhythm.     Chest Wall: PMI is displaced.     Pulses: Normal pulses. No decreased pulses.          Radial pulses are 2+ on the right side and 2+ on the left side.       Dorsalis pedis pulses are 2+ on the right side and 2+ on the left side.       Posterior tibial pulses are 2+ on the right side and 2+ on the left side.     Heart sounds: Normal heart sounds. Heart sounds not distant. No murmur heard.   No friction rub. No gallop. No S3 or S4 sounds.  Pulmonary:     Effort: Pulmonary effort is normal. No respiratory distress.     Breath sounds: Normal breath sounds. No stridor. No wheezing, rhonchi or rales.  Abdominal:     General: Bowel sounds are normal. There is no distension.     Palpations: Abdomen is soft.     Comments: Protuberant abd  Musculoskeletal:     Right lower leg: No edema.     Left lower leg: No edema.  Skin:    General: Skin is warm and dry.     Coloration: Skin is not jaundiced.     Findings: No rash.     Nails: There is no clubbing.  Neurological:     Mental Status: He is alert. Mental status is at baseline.     Cranial Nerves: No dysarthria or facial asymmetry.     Motor: No tremor or  abnormal muscle tone.     Gait: Gait normal.  Psychiatric:        Mood and Affect: Mood normal.        Speech: Speech normal.        Behavior: Behavior normal. Behavior is cooperative.    Diabetic Foot Exam - Simple   Simple Foot Form Diabetic Foot exam was performed with the following findings: Yes 08/11/2020  9:51 AM  Visual Inspection No deformities, no ulcerations, no other skin  breakdown bilaterally: Yes Sensation Testing Intact to touch and monofilament testing bilaterally: Yes Pulse Check Posterior Tibialis and Dorsalis pulse intact bilaterally: Yes Comments Some longer nails, but none thick or ingrown, good pulses and sensation b/l        Assessment & Plan:     ICD-10-CM   1. Controlled type 2 diabetes mellitus with microalbuminuria, without long-term current use of insulin (HCC)  U63.33 COMPLETE METABOLIC PANEL WITH GFR   R80.9 Hemoglobin A1C    Microalbumin, urine   recheck microalbumin, prior nephrology consult    2. CKD stage 2 due to type 2 diabetes mellitus (HCC)  L45.62 COMPLETE METABOLIC PANEL WITH GFR   N18.2    renal function varies seems to be dependent on hydration status, dm well controlled, recheck today, A1C has been at goal on metformin only, previously insulin    3. Severe nonproliferative diabetic retinopathy of left eye, with macular edema, associated with type 2 diabetes mellitus ([place])  B63.8937 COMPLETE METABOLIC PANEL WITH GFR   per specialist    4. Essential hypertension  D42 COMPLETE METABOLIC PANEL WITH GFR   has been difficult to control, but today BP is at goal, continue lisinopril-HCTZ and norvasc    5. Gastroesophageal reflux disease, unspecified whether esophagitis present  K21.9     6. Anemia, unspecified type  D64.9 CBC with Differential/Platelet    Vitamin B12   chronic, unknown etiology - renal disease? b12 deficiency, iron was normal w last labs    7. Hyperlipidemia associated with type 2 diabetes mellitus (HCC)  A76.81 COMPLETE METABOLIC PANEL WITH GFR   E78.5    stable, well controlled on statin, good compliance, no SE or concerns, recent LDL at goal    8. Severe nonproliferative diabetic retinopathy of right eye, with macular edema, associated with type 2 diabetes mellitus ([place])  E11.3411     9. LVH (left ventricular hypertrophy)  I51.7    HTN heart disease w/o HF per cardiology, lost to f/up, pt will call  to schedule     10. Vitamin B12 deficiency  E53.8 CBC with Differential/Platelet    Vitamin B12   prior labs by colleague, no tx initiated - recheck and start supplement if still low    11. Vitamin D deficiency  L57.2 COMPLETE METABOLIC PANEL WITH GFR    VITAMIN D 25 Hydroxy (Vit-D Deficiency, Fractures)    Parathyroid hormone, intact (no Ca)   instructed to take 1000 IU OTC daily, recheck    12. Elevated parathyroid hormone  I20.3 COMPLETE METABOLIC PANEL WITH GFR    VITAMIN D 25 Hydroxy (Vit-D Deficiency, Fractures)    Parathyroid hormone, intact (no Ca)   see below    13. Need for pneumococcal vaccination  Z23 Pneumococcal conjugate vaccine 20-valent (Prevnar 20)    14. Secondary hyperparathyroidism ([place])  N25.81    recheck - may be due to low Vit D - can have nephrology also follow/consult    15. DOE (dyspnea on exertion)  R06.00  with walking long distances sometimes need to stop and catch his breath, no orthopnea, PND, LE edema palpitations, f/up cardiology     16. Encounter for medication monitoring  X93.71 COMPLETE METABOLIC PANEL WITH GFR    Hemoglobin A1C    CBC with Differential/Platelet    Microalbumin, urine    VITAMIN D 25 Hydroxy (Vit-D Deficiency, Fractures)    Vitamin B12    Parathyroid hormone, intact (no Ca)       Return in about 6 months (around 02/11/2021) for Routine follow-up.   [name], PA-C 08/11/20 9:24 AM

## 2020-08-12 LAB — COMPLETE METABOLIC PANEL WITH GFR
AG Ratio: 1.3 (calc) (ref 1.0–2.5)
ALT: 9 U/L (ref 9–46)
AST: 13 U/L (ref 10–35)
Albumin: 3.9 g/dL (ref 3.6–5.1)
Alkaline phosphatase (APISO): 90 U/L (ref 35–144)
BUN/Creatinine Ratio: 12 (calc) (ref 6–22)
BUN: 19 mg/dL (ref 7–25)
CO2: 24 mmol/L (ref 20–32)
Calcium: 8.2 mg/dL — ABNORMAL LOW (ref 8.6–10.3)
Chloride: 106 mmol/L (ref 98–110)
Creat: 1.56 mg/dL — ABNORMAL HIGH (ref 0.70–1.35)
Globulin: 3 g/dL (calc) (ref 1.9–3.7)
Glucose, Bld: 139 mg/dL — ABNORMAL HIGH (ref 65–99)
Potassium: 4.5 mmol/L (ref 3.5–5.3)
Sodium: 139 mmol/L (ref 135–146)
Total Bilirubin: 0.5 mg/dL (ref 0.2–1.2)
Total Protein: 6.9 g/dL (ref 6.1–8.1)
eGFR: 48 mL/min/{1.73_m2} — ABNORMAL LOW (ref >=60)

## 2020-08-12 LAB — CBC WITH DIFFERENTIAL/PLATELET
Absolute Monocytes: 339 cells/uL (ref 200–950)
Basophils Absolute: 42 cells/uL (ref 0–200)
Basophils Relative: 0.8 %
Eosinophils Absolute: 191 cells/uL (ref 15–500)
Eosinophils Relative: 3.6 %
HCT: 32.1 % — ABNORMAL LOW (ref 38.5–50.0)
Hemoglobin: 10.7 g/dL — ABNORMAL LOW (ref 13.2–17.1)
Lymphs Abs: 1558 cells/uL (ref 850–3900)
MCH: 30.9 pg (ref 27.0–33.0)
MCHC: 33.3 g/dL (ref 32.0–36.0)
MCV: 92.8 fL (ref 80.0–100.0)
MPV: 10.3 fL (ref 7.5–12.5)
Monocytes Relative: 6.4 %
Neutro Abs: 3169 cells/uL (ref 1500–7800)
Neutrophils Relative %: 59.8 %
Platelets: 286 10*3/uL (ref 140–400)
RBC: 3.46 10*6/uL — ABNORMAL LOW (ref 4.20–5.80)
RDW: 12.1 % (ref 11.0–15.0)
Total Lymphocyte: 29.4 %
WBC: 5.3 10*3/uL (ref 3.8–10.8)

## 2020-08-12 LAB — MICROALBUMIN, URINE: Microalb, Ur: 262 mg/dL

## 2020-08-12 LAB — HEMOGLOBIN A1C
Hgb A1c MFr Bld: 6.1 % of total Hgb — ABNORMAL HIGH (ref <5.7)
Mean Plasma Glucose: 128 mg/dL
eAG (mmol/L): 7.1 mmol/L

## 2020-08-12 LAB — VITAMIN D 25 HYDROXY (VIT D DEFICIENCY, FRACTURES): Vit D, 25-Hydroxy: 32 ng/mL (ref 30–100)

## 2020-08-12 LAB — PARATHYROID HORMONE, INTACT (NO CA): PTH: 75 pg/mL (ref 16–77)

## 2020-08-12 LAB — VITAMIN B12: Vitamin B-12: 250 pg/mL (ref 200–1100)

## 2020-08-19 ENCOUNTER — Ambulatory Visit (INDEPENDENT_AMBULATORY_CARE_PROVIDER_SITE_OTHER): Payer: Medicare Other | Admitting: Ophthalmology

## 2020-08-19 ENCOUNTER — Encounter (INDEPENDENT_AMBULATORY_CARE_PROVIDER_SITE_OTHER): Payer: Self-pay | Admitting: Ophthalmology

## 2020-08-19 ENCOUNTER — Telehealth: Payer: Self-pay

## 2020-08-19 ENCOUNTER — Other Ambulatory Visit: Payer: Self-pay

## 2020-08-19 DIAGNOSIS — E113411 Type 2 diabetes mellitus with severe nonproliferative diabetic retinopathy with macular edema, right eye: Secondary | ICD-10-CM

## 2020-08-19 DIAGNOSIS — H2512 Age-related nuclear cataract, left eye: Secondary | ICD-10-CM

## 2020-08-19 DIAGNOSIS — E113412 Type 2 diabetes mellitus with severe nonproliferative diabetic retinopathy with macular edema, left eye: Secondary | ICD-10-CM

## 2020-08-19 MED ORDER — BEVACIZUMAB 2.5 MG/0.1ML IZ SOSY
2.5000 mg | PREFILLED_SYRINGE | INTRAVITREAL | Status: AC | PRN
  Administered 2020-08-19: 2.5 mg via INTRAVITREAL

## 2020-08-19 NOTE — Telephone Encounter (Signed)
Copied from [place] [id number]. Topic: General - Other >> Aug 19, 2020 11:17 AM Pawlus, Brayton Layman A wrote: Reason for CRM: Pt called back to go over his latest lab results, please advise.

## 2020-08-19 NOTE — Telephone Encounter (Signed)
Already notified.

## 2020-08-19 NOTE — Progress Notes (Signed)
08/19/2020     CHIEF COMPLAINT Patient presents for Retina Follow Up   HISTORY OF PRESENT ILLNESS: Cameron Glover is a 70 y.o. male who presents to the clinic today for:   HPI     Retina Follow Up           Diagnosis: Diabetic Retinopathy   Laterality: right eye   Onset: 10 weeks ago   Severity: mild   Duration: 10 weeks   Course: stable         Comments   10 week fu OU and Avastin OD Pt states, "I am pretty sure that my vision is about the same. I am having cataract surgery on my OS at Dr. Zenia Resides on 09/04/2020." A1C:6.1 LBS:140      Last edited by Kendra Opitz, COA on 08/19/2020  1:17 PM.      Referring physician: Warden Fillers, MD [address] [place],  [place] [address]  HISTORICAL INFORMATION:   Selected notes from the MEDICAL RECORD NUMBER    Lab Results  Component Value Date   HGBA1C 6.1 (H) 08/11/2020     CURRENT MEDICATIONS: Current Outpatient Medications (Ophthalmic Drugs)  Medication Sig   Prednisol Ace-Moxiflox-Bromfen 1-0.5-0.075 % SUSP Place 1 drop into the left eye 4 (four) times daily.   No current facility-administered medications for this visit. (Ophthalmic Drugs)   Current Outpatient Medications (Other)  Medication Sig   Accu-Chek Softclix Lancets lancets Use as instructed to check blood sugar   amLODipine (NORVASC) 10 MG tablet Take 1 tablet (10 mg total) by mouth daily.   atorvastatin (LIPITOR) 40 MG tablet TAKE 1 TABLET BY MOUTH EVERY DAY   Blood Glucose Monitoring Suppl (ACCU-CHEK GUIDE ME) w/Device KIT by Does not apply route. Sample glucometer provided in office   glucose blood (ACCU-CHEK GUIDE) test strip Use as instructed to check blood sugar   lisinopril-hydrochlorothiazide (ZESTORETIC) 20-12.5 MG tablet Take 1 tablet by mouth daily.   metFORMIN (GLUCOPHAGE) 1000 MG tablet TAKE 1 TABLET BY MOUTH TWICE A DAY WITH A MEAL   pantoprazole (PROTONIX) 40 MG tablet TAKE 1 TABLET BY MOUTH EVERY DAY   No current  facility-administered medications for this visit. (Other)      REVIEW OF SYSTEMS:    ALLERGIES No Known Allergies  PAST MEDICAL HISTORY Past Medical History:  Diagnosis Date   Acute renal failure (ARF) (HCC)    AKI (acute kidney injury) ([place]) 09/02/2017   Anemia due to chronic kidney disease 04/15/2018   DM (diabetes mellitus) with complications ([place]) 0/34/7425   Elevated lipase 04/15/2018   Hyperglycemia without ketosis    Hyperlipidemia    Hypertension    Past Surgical History:  Procedure Laterality Date   COLONOSCOPY WITH PROPOFOL N/A 01/15/2019   Procedure: COLONOSCOPY WITH PROPOFOL;  Surgeon: Jonathon Bellows, MD;  Location: [facility] ENDOSCOPY;  Service: Gastroenterology;  Laterality: N/A;    FAMILY HISTORY Family History  Problem Relation Age of Onset   Kidney failure Sister    Cancer Sister    Diabetes Sister    Hypertension Sister    Hyperlipidemia Sister    Heart disease Sister    Kidney disease Sister    Cancer Brother    CAD Brother    Diabetes Brother    Hypertension Brother    Hyperlipidemia Brother    Heart disease Brother    Diabetes Other    Cancer Mother     SOCIAL HISTORY Social History   Tobacco Use   Smoking  status: Never   Smokeless tobacco: Never  Vaping Use   Vaping Use: Never used  Substance Use Topics   Alcohol use: Never   Drug use: Never         OPHTHALMIC EXAM:  Base Eye Exam     Visual Acuity (ETDRS)       Right Left   Dist [place] 20/25 20/60   Dist ph [place]  20/50 -1         Tonometry (Tonopen, 1:19 PM)       Right Left   Pressure 16 14         Pupils       Pupils Dark Light Shape React APD   Right PERRL 4 3 Round Brisk None   Left PERRL 4 3 Round Brisk None         Visual Fields (Counting fingers)       Left Right    Full Full         Extraocular Movement       Right Left    Full Full         Neuro/Psych     Oriented x3: Yes   Mood/Affect: Normal         Dilation     Both eyes: 1.0%  Mydriacyl, 2.5% Phenylephrine @ 1:20 PM           Slit Lamp and Fundus Exam     External Exam       Right Left   External Normal Normal         Slit Lamp Exam       Right Left   Lids/Lashes Normal Normal   Conjunctiva/Sclera White and quiet White and quiet   Cornea Clear Clear   Anterior Chamber Deep and quiet Deep and quiet   Iris Round and reactive Round and reactive   Lens 2.5+ Nuclear sclerosis 3+ Nuclear sclerosis   Anterior Vitreous Normal Normal         Fundus Exam       Right Left   Posterior Vitreous Normal    Disc Normal    C/D Ratio 0.5    Macula Microaneurysms, Mild clinically significant macular edema    Vessels NPDR severe,     Periphery Cotton wool spots              IMAGING AND PROCEDURES  Imaging and Procedures for 08/19/20  OCT, Retina - OU - Both Eyes       Right Eye Quality was good. Scan locations included subfoveal. Central Foveal Thickness: 289. Progression has improved. Findings include abnormal foveal contour, vitreomacular adhesion .   Left Eye Quality was good. Scan locations included subfoveal. Central Foveal Thickness: 353. Progression has improved. Findings include vitreomacular adhesion , abnormal foveal contour.   Notes OD 10 week post injection with improved center involved CSME, repeat injection OD today  OS with center involved CSME improved at 12 weeks postinjection follow-up as scheduled, may simply observe OS     Intravitreal Injection, Pharmacologic Agent - OD - Right Eye       Time Out 08/19/2020. 1:38 PM. Confirmed correct patient, procedure, site, and patient consented.   Anesthesia Topical anesthesia was used. Anesthetic medications included Akten 3.5%.   Procedure Preparation included 5% betadine to ocular surface, 10% betadine to eyelids, Tobramycin 0.3%. A 30 gauge needle was used.   Injection: 2.5 mg bevacizumab 2.5 MG/0.1ML   Route: Intravitreal, Site: Right Eye   NDC: [phone], Lot:  [id number]   Post-op Post injection exam found visual acuity of at least counting fingers. The patient tolerated the procedure well. There were no complications. The patient received written and verbal post procedure care education. Post injection medications were not given.              ASSESSMENT/PLAN:  Nuclear sclerotic cataract of left eye Scheduled for cataract surgery early September 2022  Severe nonproliferative diabetic retinopathy of left eye, with macular edema, associated with type 2 diabetes mellitus ([place]) Center involved CSME is worsening left eye will need repeat injection Avastin next week  Severe nonproliferative diabetic retinopathy of right eye, with macular edema, associated with type 2 diabetes mellitus ([place]) Noncentral involved CSME improving with region superior the fovea we will repeat injection today to control and deliver focal laser treatment in this right eye for long-term control     ICD-10-CM   1. Severe nonproliferative diabetic retinopathy of right eye, with macular edema, associated with type 2 diabetes mellitus (HCC)  E11.3411 OCT, Retina - OU - Both Eyes    Intravitreal Injection, Pharmacologic Agent - OD - Right Eye    bevacizumab (AVASTIN) SOSY 2.5 mg    2. Nuclear sclerotic cataract of left eye  H25.12     3. Severe nonproliferative diabetic retinopathy of left eye, with macular edema, associated with type 2 diabetes mellitus ([place])  V03.5009       1.  We will repeat injection intravitreal Avastin OD today and follow-up in the near future 3 to 6 weeks for focal laser treatment superior to the fovea  2.  OS with center involved CSME is scheduled for cataract surgery in 2 weeks, will need intravitreal Avastin delivered left eye next week to decrease center involved CSME  3.  Okay to proceed with cataract extraction with intraocular lens placement in either eye as scheduled  Ophthalmic Meds Ordered this visit:  Meds ordered this encounter   Medications   bevacizumab (AVASTIN) SOSY 2.5 mg       Return in about 1 week (around 08/26/2020) for dilate, OS, AVASTIN OCT,, and 6 weeks focal laser OD.  There are no Patient Instructions on file for this visit.   Explained the diagnoses, plan, and follow up with the patient and they expressed understanding.  Patient expressed understanding of the importance of proper follow up care.   Clent Demark Kabir Brannock M.D. Diseases & Surgery of the Retina and Vitreous Retina & Diabetic [place] 08/19/20     Abbreviations: M myopia (nearsighted); A astigmatism; H hyperopia (farsighted); P presbyopia; Mrx spectacle prescription;  CTL contact lenses; OD right eye; OS left eye; OU both eyes  XT exotropia; ET esotropia; PEK punctate epithelial keratitis; PEE punctate epithelial erosions; DES dry eye syndrome; MGD meibomian gland dysfunction; ATs artificial tears; PFAT's preservative free artificial tears; [place] nuclear sclerotic cataract; PSC posterior subcapsular cataract; ERM epi-retinal membrane; PVD posterior vitreous detachment; RD retinal detachment; DM diabetes mellitus; DR diabetic retinopathy; NPDR non-proliferative diabetic retinopathy; PDR proliferative diabetic retinopathy; CSME clinically significant macular edema; DME diabetic macular edema; dbh dot blot hemorrhages; CWS cotton wool spot; POAG primary open angle glaucoma; C/D cup-to-disc ratio; HVF humphrey visual field; GVF goldmann visual field; OCT optical coherence tomography; IOP intraocular pressure; BRVO Branch retinal vein occlusion; CRVO central retinal vein occlusion; CRAO central retinal artery occlusion; BRAO branch retinal artery occlusion; RT retinal tear; SB scleral buckle; PPV pars plana vitrectomy; VH Vitreous hemorrhage; PRP panretinal laser photocoagulation; IVK intravitreal kenalog; VMT vitreomacular traction; MH Macular  hole;  NVD neovascularization of the disc; NVE neovascularization elsewhere; AREDS age related eye disease  study; ARMD age related macular degeneration; POAG primary open angle glaucoma; EBMD epithelial/anterior basement membrane dystrophy; ACIOL anterior chamber intraocular lens; IOL intraocular lens; PCIOL posterior chamber intraocular lens; Phaco/IOL phacoemulsification with intraocular lens placement; [place] photorefractive keratectomy; LASIK laser assisted in situ keratomileusis; HTN hypertension; DM diabetes mellitus; COPD chronic obstructive pulmonary disease

## 2020-08-19 NOTE — Assessment & Plan Note (Signed)
Noncentral involved CSME improving with region superior the fovea we will repeat injection today to control and deliver focal laser treatment in this right eye for long-term control

## 2020-08-19 NOTE — Assessment & Plan Note (Signed)
Center involved CSME is worsening left eye will need repeat injection Avastin next week

## 2020-08-19 NOTE — Assessment & Plan Note (Signed)
Scheduled for cataract surgery early September 2022

## 2020-08-26 ENCOUNTER — Encounter (INDEPENDENT_AMBULATORY_CARE_PROVIDER_SITE_OTHER): Payer: Medicare Other | Admitting: Ophthalmology

## 2020-08-27 ENCOUNTER — Ambulatory Visit (INDEPENDENT_AMBULATORY_CARE_PROVIDER_SITE_OTHER): Payer: Medicare Other | Admitting: Ophthalmology

## 2020-08-27 ENCOUNTER — Encounter (INDEPENDENT_AMBULATORY_CARE_PROVIDER_SITE_OTHER): Payer: Self-pay | Admitting: Ophthalmology

## 2020-08-27 ENCOUNTER — Other Ambulatory Visit: Payer: Self-pay

## 2020-08-27 DIAGNOSIS — E113412 Type 2 diabetes mellitus with severe nonproliferative diabetic retinopathy with macular edema, left eye: Secondary | ICD-10-CM

## 2020-08-27 DIAGNOSIS — H2512 Age-related nuclear cataract, left eye: Secondary | ICD-10-CM

## 2020-08-27 DIAGNOSIS — E113411 Type 2 diabetes mellitus with severe nonproliferative diabetic retinopathy with macular edema, right eye: Secondary | ICD-10-CM

## 2020-08-27 MED ORDER — BEVACIZUMAB 2.5 MG/0.1ML IZ SOSY
2.5000 mg | PREFILLED_SYRINGE | INTRAVITREAL | Status: AC | PRN
  Administered 2020-08-27: 2.5 mg via INTRAVITREAL

## 2020-08-27 NOTE — Assessment & Plan Note (Signed)
1 week post injection Avastin and improved noncentral involved CSME follow-up as scheduled

## 2020-08-27 NOTE — Assessment & Plan Note (Signed)
CSME OS new onset center involved, commence with intravitreal Avastin again today.  Follow-up left eye in 6 weeks.  Okay to proceed with cataract extraction intraocular lens placement planned September 1 or thereabouts

## 2020-08-27 NOTE — Assessment & Plan Note (Signed)
OS proceed with cataract extraction with intraocular lens placement as planned, he believes with Dr. Quentin Ore

## 2020-08-27 NOTE — Progress Notes (Signed)
08/27/2020     CHIEF COMPLAINT Patient presents for  Chief Complaint  Patient presents with   Retina Follow Up      HISTORY OF PRESENT ILLNESS: Cameron Glover is a 70 y.o. male who presents to the clinic today for:   HPI     Retina Follow Up           Diagnosis: Diabetic Retinopathy   Laterality: left eye   Onset: 5 months ago   Severity: mild   Duration: 5 months   Course: stable         Comments   5 months since OS avastin inj, oct ou Patient states vision is stable and unchanged since last visit. Denies any new floaters or FOL. BS: 132 this morning A1C: 6.1, 08/11/20      Last edited by Laurin Coder, COA on 08/27/2020  9:34 AM.      Referring physician: [name], PA-C [address] [place],  [place] [address]  HISTORICAL INFORMATION:   Selected notes from the MEDICAL RECORD NUMBER    Lab Results  Component Value Date   HGBA1C 6.1 (H) 08/11/2020     CURRENT MEDICATIONS: Current Outpatient Medications (Ophthalmic Drugs)  Medication Sig   Prednisol Ace-Moxiflox-Bromfen 1-0.5-0.075 % SUSP Place 1 drop into the left eye 4 (four) times daily.   No current facility-administered medications for this visit. (Ophthalmic Drugs)   Current Outpatient Medications (Other)  Medication Sig   Accu-Chek Softclix Lancets lancets Use as instructed to check blood sugar   amLODipine (NORVASC) 10 MG tablet Take 1 tablet (10 mg total) by mouth daily.   atorvastatin (LIPITOR) 40 MG tablet TAKE 1 TABLET BY MOUTH EVERY DAY   Blood Glucose Monitoring Suppl (ACCU-CHEK GUIDE ME) w/Device KIT by Does not apply route. Sample glucometer provided in office   glucose blood (ACCU-CHEK GUIDE) test strip Use as instructed to check blood sugar   lisinopril-hydrochlorothiazide (ZESTORETIC) 20-12.5 MG tablet Take 1 tablet by mouth daily.   metFORMIN (GLUCOPHAGE) 1000 MG tablet TAKE 1 TABLET BY MOUTH TWICE A DAY WITH A MEAL   pantoprazole (PROTONIX) 40 MG tablet TAKE 1  TABLET BY MOUTH EVERY DAY   No current facility-administered medications for this visit. (Other)      REVIEW OF SYSTEMS:    ALLERGIES No Known Allergies  PAST MEDICAL HISTORY Past Medical History:  Diagnosis Date   Acute renal failure (ARF) (HCC)    AKI (acute kidney injury) ([place]) 09/02/2017   Anemia due to chronic kidney disease 04/15/2018   DM (diabetes mellitus) with complications ([place]) 06/16/2447   Elevated lipase 04/15/2018   Hyperglycemia without ketosis    Hyperlipidemia    Hypertension    Past Surgical History:  Procedure Laterality Date   COLONOSCOPY WITH PROPOFOL N/A 01/15/2019   Procedure: COLONOSCOPY WITH PROPOFOL;  Surgeon: Jonathon Bellows, MD;  Location: [facility] ENDOSCOPY;  Service: Gastroenterology;  Laterality: N/A;    FAMILY HISTORY Family History  Problem Relation Age of Onset   Kidney failure Sister    Cancer Sister    Diabetes Sister    Hypertension Sister    Hyperlipidemia Sister    Heart disease Sister    Kidney disease Sister    Cancer Brother    CAD Brother    Diabetes Brother    Hypertension Brother    Hyperlipidemia Brother    Heart disease Brother    Diabetes Other    Cancer Mother     SOCIAL HISTORY Social History  Tobacco Use   Smoking status: Never   Smokeless tobacco: Never  Vaping Use   Vaping Use: Never used  Substance Use Topics   Alcohol use: Never   Drug use: Never         OPHTHALMIC EXAM:  Base Eye Exam     Visual Acuity (Snellen - Linear)       Right Left   Dist [place] 20/20 -1 20/40   Dist ph   20/30-1         Tonometry (Tonopen, 9:31 AM)       Right Left   Pressure 17 17         Pupils       Pupils Dark Light APD   Right PERRL 4 3 None   Left PERRL 4 3 None         Extraocular Movement       Right Left    Full, Ortho Full, Ortho         Neuro/Psych     Oriented x3: Yes   Mood/Affect: Normal         Dilation     Left eye: 1.0% Mydriacyl, 2.5% Phenylephrine @ 9:31 AM            Slit Lamp and Fundus Exam     External Exam       Right Left   External Normal Normal         Slit Lamp Exam       Right Left   Lids/Lashes Normal Normal   Conjunctiva/Sclera White and quiet White and quiet   Cornea Clear Clear   Anterior Chamber Deep and quiet Deep and quiet   Iris Round and reactive Round and reactive   Lens 2.5+ Nuclear sclerosis 3+ Nuclear sclerosis, Cortical spokes, 2+ Cortical cataract   Anterior Vitreous Normal Normal         Fundus Exam       Right Left   Posterior Vitreous  Posterior vitreous detachment   Disc  Normal   C/D Ratio  0.5   Macula  Microaneurysms, , Mild clinically significant macular edema   Vessels   NPDR severe   Periphery  Normal            IMAGING AND PROCEDURES  Imaging and Procedures for 08/27/20  Intravitreal Injection, Pharmacologic Agent - OS - Left Eye       Time Out 08/27/2020. 10:03 AM. Confirmed correct patient, procedure, site, and patient consented.   Anesthesia Topical anesthesia was used. Anesthetic medications included Akten 3.5%.   Procedure Preparation included 10% betadine to eyelids, 5% betadine to ocular surface, Ofloxacin . A 30 gauge needle was used.   Injection: 2.5 mg bevacizumab 2.5 MG/0.1ML   Route: Intravitreal, Site: Left Eye   NDC: [phone], Lot: [id number]   Post-op Post injection exam found visual acuity of at least counting fingers. The patient tolerated the procedure well. There were no complications. The patient received written and verbal post procedure care education. Post injection medications were not given.      OCT, Retina - OU - Both Eyes       Right Eye Quality was good. Scan locations included subfoveal. Central Foveal Thickness: 283. Progression has improved. Findings include abnormal foveal contour, vitreomacular adhesion .   Left Eye Quality was good. Scan locations included subfoveal. Central Foveal Thickness: 344. Progression has improved.  Findings include vitreomacular adhesion , abnormal foveal contour.   Notes OD 1 week post injection with improved  center involved CSME, repeat injection OD today  OS with center involved CSME improved at 20 weeks postinjection follow-up as scheduled, and with increased CSME recently noted, will commence with retreatment of CSME with intravitreal Avastin today             ASSESSMENT/PLAN:  Severe nonproliferative diabetic retinopathy of left eye, with macular edema, associated with type 2 diabetes mellitus ([place]) CSME OS new onset center involved, commence with intravitreal Avastin again today.  Follow-up left eye in 6 weeks.  Okay to proceed with cataract extraction intraocular lens placement planned September 1 or thereabouts  Severe nonproliferative diabetic retinopathy of right eye, with macular edema, associated with type 2 diabetes mellitus ([place]) 1 week post injection Avastin and improved noncentral involved CSME follow-up as scheduled  Nuclear sclerotic cataract of left eye OS proceed with cataract extraction with intraocular lens placement as planned, he believes with Dr. Quentin Ore     ICD-10-CM   1. Severe nonproliferative diabetic retinopathy of left eye, with macular edema, associated with type 2 diabetes mellitus (HCC)  W43.1540 Intravitreal Injection, Pharmacologic Agent - OS - Left Eye    OCT, Retina - OU - Both Eyes    bevacizumab (AVASTIN) SOSY 2.5 mg    2. Severe nonproliferative diabetic retinopathy of right eye, with macular edema, associated with type 2 diabetes mellitus ([place])  E11.3411     3. Nuclear sclerotic cataract of left eye  H25.12       1.  Noncentral involved CSME OD improved at 1 week follow-up as scheduled  2.  OS with center involved CSME new onset at 5 months interval post injection, repeat injection today follow-up OS next in 6 weeks  3.  Bilateral cataracts present okay to proceed with surgical intervention as scheduled in either eye at  any time  Ophthalmic Meds Ordered this visit:  Meds ordered this encounter  Medications   bevacizumab (AVASTIN) SOSY 2.5 mg       No follow-ups on file.  There are no Patient Instructions on file for this visit.   Explained the diagnoses, plan, and follow up with the patient and they expressed understanding.  Patient expressed understanding of the importance of proper follow up care.   Clent Demark Gibson Lad M.D. Diseases & Surgery of the Retina and Vitreous Retina & Diabetic [place] 08/27/20     Abbreviations: M myopia (nearsighted); A astigmatism; H hyperopia (farsighted); P presbyopia; Mrx spectacle prescription;  CTL contact lenses; OD right eye; OS left eye; OU both eyes  XT exotropia; ET esotropia; PEK punctate epithelial keratitis; PEE punctate epithelial erosions; DES dry eye syndrome; MGD meibomian gland dysfunction; ATs artificial tears; PFAT's preservative free artificial tears; [place] nuclear sclerotic cataract; PSC posterior subcapsular cataract; ERM epi-retinal membrane; PVD posterior vitreous detachment; RD retinal detachment; DM diabetes mellitus; DR diabetic retinopathy; NPDR non-proliferative diabetic retinopathy; PDR proliferative diabetic retinopathy; CSME clinically significant macular edema; DME diabetic macular edema; dbh dot blot hemorrhages; CWS cotton wool spot; POAG primary open angle glaucoma; C/D cup-to-disc ratio; HVF humphrey visual field; GVF goldmann visual field; OCT optical coherence tomography; IOP intraocular pressure; BRVO Branch retinal vein occlusion; CRVO central retinal vein occlusion; CRAO central retinal artery occlusion; BRAO branch retinal artery occlusion; RT retinal tear; SB scleral buckle; PPV pars plana vitrectomy; VH Vitreous hemorrhage; PRP panretinal laser photocoagulation; IVK intravitreal kenalog; VMT vitreomacular traction; MH Macular hole;  NVD neovascularization of the disc; NVE neovascularization elsewhere; AREDS age related eye disease  study; ARMD age related macular degeneration;  POAG primary open angle glaucoma; EBMD epithelial/anterior basement membrane dystrophy; ACIOL anterior chamber intraocular lens; IOL intraocular lens; PCIOL posterior chamber intraocular lens; Phaco/IOL phacoemulsification with intraocular lens placement; [place] photorefractive keratectomy; LASIK laser assisted in situ keratomileusis; HTN hypertension; DM diabetes mellitus; COPD chronic obstructive pulmonary disease

## 2020-09-01 ENCOUNTER — Encounter (INDEPENDENT_AMBULATORY_CARE_PROVIDER_SITE_OTHER): Payer: Medicare Other | Admitting: Ophthalmology

## 2020-09-30 ENCOUNTER — Encounter (INDEPENDENT_AMBULATORY_CARE_PROVIDER_SITE_OTHER): Payer: Medicare Other | Admitting: Ophthalmology

## 2020-09-30 ENCOUNTER — Other Ambulatory Visit: Payer: Self-pay

## 2020-10-02 ENCOUNTER — Other Ambulatory Visit: Payer: Self-pay

## 2020-10-02 ENCOUNTER — Encounter (INDEPENDENT_AMBULATORY_CARE_PROVIDER_SITE_OTHER): Payer: Self-pay | Admitting: Ophthalmology

## 2020-10-02 ENCOUNTER — Ambulatory Visit (INDEPENDENT_AMBULATORY_CARE_PROVIDER_SITE_OTHER): Payer: Medicare Other | Admitting: Ophthalmology

## 2020-10-02 DIAGNOSIS — E113411 Type 2 diabetes mellitus with severe nonproliferative diabetic retinopathy with macular edema, right eye: Secondary | ICD-10-CM

## 2020-10-02 DIAGNOSIS — E113412 Type 2 diabetes mellitus with severe nonproliferative diabetic retinopathy with macular edema, left eye: Secondary | ICD-10-CM

## 2020-10-02 NOTE — Assessment & Plan Note (Signed)
OS at 5 to 6 weeks post injection Avastin, CSME has recurred, will need repeat injection and potentially more frequent injections in the future OS

## 2020-10-02 NOTE — Progress Notes (Signed)
10/02/2020     CHIEF COMPLAINT Patient presents for  Chief Complaint  Patient presents with   Retina Follow Up      HISTORY OF PRESENT ILLNESS: Cameron Glover is a 70 y.o. male who presents to the clinic today for:   HPI     Retina Follow Up   Patient presents with  Diabetic Retinopathy.  In right eye.  This started 6 weeks ago.  Duration of 6 weeks.        Comments   6 week Focal Laser Treatment OD with OCT  Pt denies any visual changes since previous visit. Pt denies any new flashes or floaters. Pt denies any eye pain.  Type 2 Diabetic. Diagnosed for ~ 7 years. BS: ~ 140  EyeMeds: Pred-Moxi-Brom QD OS      Last edited by Reather Littler, COA on 10/02/2020  1:36 PM.      Referring physician: [name], PA-C [address] [place],  [place] [address]  HISTORICAL INFORMATION:   Selected notes from the MEDICAL RECORD NUMBER    Lab Results  Component Value Date   HGBA1C 6.1 (H) 08/11/2020     CURRENT MEDICATIONS: Current Outpatient Medications (Ophthalmic Drugs)  Medication Sig   Prednisol Ace-Moxiflox-Bromfen 1-0.5-0.075 % SUSP Place 1 drop into the left eye 4 (four) times daily.   No current facility-administered medications for this visit. (Ophthalmic Drugs)   Current Outpatient Medications (Other)  Medication Sig   Accu-Chek Softclix Lancets lancets Use as instructed to check blood sugar   amLODipine (NORVASC) 10 MG tablet Take 1 tablet (10 mg total) by mouth daily.   atorvastatin (LIPITOR) 40 MG tablet TAKE 1 TABLET BY MOUTH EVERY DAY   Blood Glucose Monitoring Suppl (ACCU-CHEK GUIDE ME) w/Device KIT by Does not apply route. Sample glucometer provided in office   glucose blood (ACCU-CHEK GUIDE) test strip Use as instructed to check blood sugar   lisinopril-hydrochlorothiazide (ZESTORETIC) 20-12.5 MG tablet Take 1 tablet by mouth daily.   metFORMIN (GLUCOPHAGE) 1000 MG tablet TAKE 1 TABLET BY MOUTH TWICE A DAY WITH A MEAL    pantoprazole (PROTONIX) 40 MG tablet TAKE 1 TABLET BY MOUTH EVERY DAY   No current facility-administered medications for this visit. (Other)      REVIEW OF SYSTEMS:    ALLERGIES No Known Allergies  PAST MEDICAL HISTORY Past Medical History:  Diagnosis Date   Acute renal failure (ARF) (HCC)    AKI (acute kidney injury) ([place]) 09/02/2017   Anemia due to chronic kidney disease 04/15/2018   DM (diabetes mellitus) with complications ([place]) 04/06/4740   Elevated lipase 04/15/2018   Hyperglycemia without ketosis    Hyperlipidemia    Hypertension    Past Surgical History:  Procedure Laterality Date   COLONOSCOPY WITH PROPOFOL N/A 01/15/2019   Procedure: COLONOSCOPY WITH PROPOFOL;  Surgeon: Jonathon Bellows, MD;  Location: [facility] ENDOSCOPY;  Service: Gastroenterology;  Laterality: N/A;    FAMILY HISTORY Family History  Problem Relation Age of Onset   Kidney failure Sister    Cancer Sister    Diabetes Sister    Hypertension Sister    Hyperlipidemia Sister    Heart disease Sister    Kidney disease Sister    Cancer Brother    CAD Brother    Diabetes Brother    Hypertension Brother    Hyperlipidemia Brother    Heart disease Brother    Diabetes Other    Cancer Mother     SOCIAL HISTORY Social History  Tobacco Use   Smoking status: Never   Smokeless tobacco: Never  Vaping Use   Vaping Use: Never used  Substance Use Topics   Alcohol use: Never   Drug use: Never         OPHTHALMIC EXAM:  Base Eye Exam     Visual Acuity (ETDRS)       Right Left   Dist [place] 20/20 -2 20/20 -2         Tonometry (Tonopen, 1:43 PM)       Right Left   Pressure 10 9         Pupils       Pupils Dark Light Shape React APD   Right PERRL 4 3 Round Brisk None   Left PERRL 4 3 Round Brisk None         Visual Fields (Counting fingers)       Left Right    Full Full         Extraocular Movement       Right Left    Full, Ortho Full, Ortho         Neuro/Psych      Oriented x3: Yes   Mood/Affect: Normal         Dilation     Right eye: 1.0% Mydriacyl, 2.5% Phenylephrine @ 1:43 PM           Slit Lamp and Fundus Exam     External Exam       Right Left   External Normal Normal         Slit Lamp Exam       Right Left   Lids/Lashes Normal Normal   Conjunctiva/Sclera White and quiet White and quiet   Cornea Clear Clear   Anterior Chamber Deep and quiet Deep and quiet   Iris Round and reactive Round and reactive   Lens 2.5+ Nuclear sclerosis 3+ Nuclear sclerosis, Cortical spokes, 2+ Cortical cataract   Anterior Vitreous Normal Normal         Fundus Exam       Right Left   Posterior Vitreous Normal    Disc Normal    C/D Ratio 0.5    Macula Microaneurysms, Mild clinically significant macular edema superiorly     Vessels  , NPDR-Severe    Periphery Cotton wool spots              IMAGING AND PROCEDURES  Imaging and Procedures for 10/02/20  OCT, Retina - OU - Both Eyes       Right Eye Quality was good. Scan locations included subfoveal. Central Foveal Thickness: 280. Progression has improved. Findings include abnormal foveal contour, vitreomacular adhesion .   Left Eye Quality was good. Scan locations included subfoveal. Central Foveal Thickness: 432. Progression has improved. Findings include vitreomacular adhesion , abnormal foveal contour.   Notes  OD  with focal CSME SUPERIOR to faz, will treat with focal today follow-up in 6 weeks postinjection   OS with center involved CSME improved increased at 5 weeks postinjection Avastin OS will need repeat injection soon     Focal Laser - OD - Right Eye       Time Out Confirmed correct patient, procedure, site, and patient consented.   Anesthesia Anesthetic medications included Proparacaine 0.5%.   Laser Information The type of laser was diode. Color was yellow. The duration in seconds was 0.1. The spot size was 100 microns. Laser power was 50. Total spots was  54.   Post-op  There were no complications. The patient received written and verbal post procedure care education.   Notes Focal positive superior to FAZ, OCT guided, navigated laser             ASSESSMENT/PLAN:  Severe nonproliferative diabetic retinopathy of right eye, with macular edema, associated with type 2 diabetes mellitus (HCC) Minor noncentral involved residual CSME status post injection Avastin OD, will deliver focal laser treatment superiorly to this region and decrease patient's treatment burden long-term  Severe nonproliferative diabetic retinopathy of left eye, with macular edema, associated with type 2 diabetes mellitus (HCC) OS at 5 to 6 weeks post injection Avastin, CSME has recurred, will need repeat injection and potentially more frequent injections in the future OS     ICD-10-CM   1. Severe nonproliferative diabetic retinopathy of right eye, with macular edema, associated with type 2 diabetes mellitus (HCC)  E11.3411 OCT, Retina - OU - Both Eyes    Focal Laser - OD - Right Eye    2. Severe nonproliferative diabetic retinopathy of left eye, with macular edema, associated with type 2 diabetes mellitus ([place])  G86.7619       1.  2.  3.  Ophthalmic Meds Ordered this visit:  No orders of the defined types were placed in this encounter.      Return for As scheduled, dilate, OS, AVASTIN OCT,, and will dilate OD in the future likely 4 months.  There are no Patient Instructions on file for this visit.   Explained the diagnoses, plan, and follow up with the patient and they expressed understanding.  Patient expressed understanding of the importance of proper follow up care.   Clent Demark Harrold Fitchett M.D. Diseases & Surgery of the Retina and Vitreous Retina & Diabetic [place] 10/02/20     Abbreviations: M myopia (nearsighted); A astigmatism; H hyperopia (farsighted); P presbyopia; Mrx spectacle prescription;  CTL contact lenses; OD right eye; OS left eye;  OU both eyes  XT exotropia; ET esotropia; PEK punctate epithelial keratitis; PEE punctate epithelial erosions; DES dry eye syndrome; MGD meibomian gland dysfunction; ATs artificial tears; PFAT's preservative free artificial tears; [place] nuclear sclerotic cataract; PSC posterior subcapsular cataract; ERM epi-retinal membrane; PVD posterior vitreous detachment; RD retinal detachment; DM diabetes mellitus; DR diabetic retinopathy; NPDR non-proliferative diabetic retinopathy; PDR proliferative diabetic retinopathy; CSME clinically significant macular edema; DME diabetic macular edema; dbh dot blot hemorrhages; CWS cotton wool spot; POAG primary open angle glaucoma; C/D cup-to-disc ratio; HVF humphrey visual field; GVF goldmann visual field; OCT optical coherence tomography; IOP intraocular pressure; BRVO Branch retinal vein occlusion; CRVO central retinal vein occlusion; CRAO central retinal artery occlusion; BRAO branch retinal artery occlusion; RT retinal tear; SB scleral buckle; PPV pars plana vitrectomy; VH Vitreous hemorrhage; PRP panretinal laser photocoagulation; IVK intravitreal kenalog; VMT vitreomacular traction; MH Macular hole;  NVD neovascularization of the disc; NVE neovascularization elsewhere; AREDS age related eye disease study; ARMD age related macular degeneration; POAG primary open angle glaucoma; EBMD epithelial/anterior basement membrane dystrophy; ACIOL anterior chamber intraocular lens; IOL intraocular lens; PCIOL posterior chamber intraocular lens; Phaco/IOL phacoemulsification with intraocular lens placement; [place] photorefractive keratectomy; LASIK laser assisted in situ keratomileusis; HTN hypertension; DM diabetes mellitus; COPD chronic obstructive pulmonary disease

## 2020-10-02 NOTE — Assessment & Plan Note (Signed)
Minor noncentral involved residual CSME status post injection Avastin OD, will deliver focal laser treatment superiorly to this region and decrease patient's treatment burden long-term

## 2020-10-13 ENCOUNTER — Other Ambulatory Visit: Payer: Self-pay

## 2020-10-13 ENCOUNTER — Ambulatory Visit (INDEPENDENT_AMBULATORY_CARE_PROVIDER_SITE_OTHER): Payer: Medicare Other | Admitting: Ophthalmology

## 2020-10-13 ENCOUNTER — Encounter (INDEPENDENT_AMBULATORY_CARE_PROVIDER_SITE_OTHER): Payer: Self-pay | Admitting: Ophthalmology

## 2020-10-13 DIAGNOSIS — E113411 Type 2 diabetes mellitus with severe nonproliferative diabetic retinopathy with macular edema, right eye: Secondary | ICD-10-CM

## 2020-10-13 DIAGNOSIS — E113412 Type 2 diabetes mellitus with severe nonproliferative diabetic retinopathy with macular edema, left eye: Secondary | ICD-10-CM | POA: Diagnosis not present

## 2020-10-13 DIAGNOSIS — H43823 Vitreomacular adhesion, bilateral: Secondary | ICD-10-CM

## 2020-10-13 DIAGNOSIS — Z961 Presence of intraocular lens: Secondary | ICD-10-CM | POA: Diagnosis not present

## 2020-10-13 DIAGNOSIS — H2511 Age-related nuclear cataract, right eye: Secondary | ICD-10-CM

## 2020-10-13 MED ORDER — BEVACIZUMAB 2.5 MG/0.1ML IZ SOSY
2.5000 mg | PREFILLED_SYRINGE | INTRAVITREAL | Status: AC | PRN
  Administered 2020-10-13: 2.5 mg via INTRAVITREAL

## 2020-10-13 NOTE — Assessment & Plan Note (Signed)
OD, okay to proceed with CE IOL at any time under the direction of Dr. Carolynn Sayers and patient's understanding

## 2020-10-13 NOTE — Assessment & Plan Note (Signed)
Looks great OS under direction of Dr. Carolynn Sayers

## 2020-10-13 NOTE — Assessment & Plan Note (Signed)
Minor OS stable 

## 2020-10-13 NOTE — Assessment & Plan Note (Signed)
OS onset as of CSME August 2022, improved overall at 5 weeks post Avastin but now at 6 weeks and 5 days post Avastin, massive CSME increase.  Will need repeat antivegF OS today and examination next in 5 weeks

## 2020-10-13 NOTE — Progress Notes (Addendum)
10/13/2020     CHIEF COMPLAINT Patient presents for  Chief Complaint  Patient presents with   Retina Follow Up      HISTORY OF PRESENT ILLNESS: Cameron Glover is a 70 y.o. male who presents to the clinic today for:   HPI     Retina Follow Up   Patient presents with  Diabetic Retinopathy.  In right eye.  This started 6 weeks ago.  Duration of 6 weeks.        Comments   6 week fu OS oct avastin OS. Patient states vision is stable and unchanged since last visit. Denies any new floaters or FOL. A1C: unknown.  LBS: 120 yesterday, not checked this morning      Last edited by Laurin Coder on 10/13/2020  9:16 AM.      Referring physician: [name], PA-C [address] [place],  [place] [address]  HISTORICAL INFORMATION:   Selected notes from the MEDICAL RECORD NUMBER    Lab Results  Component Value Date   HGBA1C 6.1 (H) 08/11/2020     CURRENT MEDICATIONS: Current Outpatient Medications (Ophthalmic Drugs)  Medication Sig   Prednisol Ace-Moxiflox-Bromfen 1-0.5-0.075 % SUSP Place 1 drop into the left eye 4 (four) times daily.   No current facility-administered medications for this visit. (Ophthalmic Drugs)   Current Outpatient Medications (Other)  Medication Sig   Accu-Chek Softclix Lancets lancets Use as instructed to check blood sugar   amLODipine (NORVASC) 10 MG tablet Take 1 tablet (10 mg total) by mouth daily.   atorvastatin (LIPITOR) 40 MG tablet TAKE 1 TABLET BY MOUTH EVERY DAY   Blood Glucose Monitoring Suppl (ACCU-CHEK GUIDE ME) w/Device KIT by Does not apply route. Sample glucometer provided in office   glucose blood (ACCU-CHEK GUIDE) test strip Use as instructed to check blood sugar   lisinopril-hydrochlorothiazide (ZESTORETIC) 20-12.5 MG tablet Take 1 tablet by mouth daily.   metFORMIN (GLUCOPHAGE) 1000 MG tablet TAKE 1 TABLET BY MOUTH TWICE A DAY WITH A MEAL   pantoprazole (PROTONIX) 40 MG tablet TAKE 1 TABLET BY MOUTH EVERY DAY    No current facility-administered medications for this visit. (Other)      REVIEW OF SYSTEMS:    ALLERGIES No Known Allergies  PAST MEDICAL HISTORY Past Medical History:  Diagnosis Date   Acute renal failure (ARF) (HCC)    AKI (acute kidney injury) ([place]) 09/02/2017   Anemia due to chronic kidney disease 04/15/2018   DM (diabetes mellitus) with complications ([place]) 4/48/1856   Elevated lipase 04/15/2018   Hyperglycemia without ketosis    Hyperlipidemia    Hypertension    Nuclear sclerotic cataract of left eye 05/14/2019   Past Surgical History:  Procedure Laterality Date   COLONOSCOPY WITH PROPOFOL N/A 01/15/2019   Procedure: COLONOSCOPY WITH PROPOFOL;  Surgeon: Jonathon Bellows, MD;  Location: [facility] ENDOSCOPY;  Service: Gastroenterology;  Laterality: N/A;    FAMILY HISTORY Family History  Problem Relation Age of Onset   Kidney failure Sister    Cancer Sister    Diabetes Sister    Hypertension Sister    Hyperlipidemia Sister    Heart disease Sister    Kidney disease Sister    Cancer Brother    CAD Brother    Diabetes Brother    Hypertension Brother    Hyperlipidemia Brother    Heart disease Brother    Diabetes Other    Cancer Mother     SOCIAL HISTORY Social History   Tobacco Use  Smoking status: Never   Smokeless tobacco: Never  Vaping Use   Vaping Use: Never used  Substance Use Topics   Alcohol use: Never   Drug use: Never         OPHTHALMIC EXAM:  Base Eye Exam     Visual Acuity (ETDRS)       Right Left   Dist [place] 20/20 -1 20/40 -2   Dist ph [place]  NI         Tonometry (Tonopen, 9:19 AM)       Right Left   Pressure 12 14         Pupils       Pupils Dark Light APD   Right PERRL 4 3 None   Left PERRL 4 3 None         Extraocular Movement       Right Left    Full Full         Neuro/Psych     Oriented x3: Yes   Mood/Affect: Normal         Dilation     Left eye: 1.0% Mydriacyl, 2.5% Phenylephrine @ 9:19 AM            Slit Lamp and Fundus Exam     External Exam       Right Left   External Normal Normal         Slit Lamp Exam       Right Left   Lids/Lashes Normal Normal   Conjunctiva/Sclera White and quiet White and quiet   Cornea Clear Clear   Anterior Chamber Deep and quiet Deep and quiet   Iris Round and reactive Round and reactive   Lens 2.5+ Nuclear sclerosis Centered posterior chamber intraocular lens   Anterior Vitreous Normal Normal         Fundus Exam       Right Left   Posterior Vitreous  Posterior vitreous detachment   Disc  Normal   C/D Ratio  0.5   Macula  Microaneurysms, , Mild clinically significant macular edema   Vessels   NPDR severe   Periphery  Normal            IMAGING AND PROCEDURES  Imaging and Procedures for 10/13/20  OCT, Retina - OU - Both Eyes       Right Eye Quality was good. Scan locations included subfoveal. Central Foveal Thickness: 283. Progression has improved. Findings include abnormal foveal contour, vitreomacular adhesion .   Left Eye Quality was good. Scan locations included subfoveal. Central Foveal Thickness: 802. Progression has worsened. Findings include vitreomacular adhesion , abnormal foveal contour.   Notes  OD  with focal CSME SUPERIOR to faz, remained stable post focal laser treatment right eye   OS with center involved CSME now worsened at 6 weeks and 5 days post Avastin yet stable previously at 5-week interval.     Intravitreal Injection, Pharmacologic Agent - OS - Left Eye       Time Out 10/13/2020. 10:08 AM. Confirmed correct patient, procedure, site, and patient consented.   Anesthesia Topical anesthesia was used. Anesthetic medications included [place] 3.5%.   Procedure Preparation included 10% betadine to eyelids, 5% betadine to ocular surface, Ofloxacin . A 30 gauge needle was used.   Injection: 2.5 mg bevacizumab 2.5 MG/0.1ML   Route: Intravitreal, Site: Left Eye   NDC: [phone], Lot:  [id number]   Post-op Post injection exam found visual acuity of at least counting fingers. The patient tolerated the  procedure well. There were no complications. The patient received written and verbal post procedure care education. Post injection medications included ocuflox.              ASSESSMENT/PLAN:  Severe nonproliferative diabetic retinopathy of right eye, with macular edema, associated with type 2 diabetes mellitus ([place]) Today some 12 days post focal laser for residual CSME extra foveal, stable again today.  Will observe hereafter OD  Severe nonproliferative diabetic retinopathy of left eye, with macular edema, associated with type 2 diabetes mellitus ([place]) OS onset as of CSME August 2022, improved overall at 5 weeks post Avastin but now at 6 weeks and 5 days post Avastin, massive CSME increase.  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 Carolynn Sayers  Nuclear sclerotic cataract of right eye OD, okay to proceed with CE IOL at any time under the direction of Dr. Carolynn Sayers and patient's understanding     ICD-10-CM   1. Severe nonproliferative diabetic retinopathy of left eye, with macular edema, associated with type 2 diabetes mellitus (HCC)  S17.7939 OCT, Retina - OU - Both Eyes    Intravitreal Injection, Pharmacologic Agent - OS - Left Eye    bevacizumab (AVASTIN) SOSY 2.5 mg    2. Severe nonproliferative diabetic retinopathy of right eye, with macular edema, associated with type 2 diabetes mellitus ([place])  E11.3411     3. Vitreomacular adhesion of both eyes  H43.823     4. Pseudophakia of left eye  Z96.1     5. Nuclear sclerotic cataract of right eye  H25.11       1.  OS, massive increase in CSME at 6 weeks 5 days postinjection Avastin.  Repeat injection today and maintain 5-week interval follow-up OS.  2.  OD, OCT evaluation in the  postop.  Post focal laser treatment looks great with residual CSME still improving  3.  Ophthalmic Meds Ordered this visit:  Meds ordered this encounter  Medications   bevacizumab (AVASTIN) SOSY 2.5 mg       Return in about 5 weeks (around 11/17/2020) for DILATE OU, AVASTIN OCT, OS.  There are no Patient Instructions on file for this visit.   Explained the diagnoses, plan, and follow up with the patient and they expressed understanding.  Patient expressed understanding of the importance of proper follow up care.   Clent Demark Ricki Clack M.D. Diseases & Surgery of the Retina and Vitreous Retina & Diabetic [place] 10/13/20     Abbreviations: M myopia (nearsighted); A astigmatism; H hyperopia (farsighted); P presbyopia; Mrx spectacle prescription;  CTL contact lenses; OD right eye; OS left eye; OU both eyes  XT exotropia; ET esotropia; PEK punctate epithelial keratitis; PEE punctate epithelial erosions; DES dry eye syndrome; MGD meibomian gland dysfunction; ATs artificial tears; PFAT's preservative free artificial tears; [place] nuclear sclerotic cataract; PSC posterior subcapsular cataract; ERM epi-retinal membrane; PVD posterior vitreous detachment; RD retinal detachment; DM diabetes mellitus; DR diabetic retinopathy; NPDR non-proliferative diabetic retinopathy; PDR proliferative diabetic retinopathy; CSME clinically significant macular edema; DME diabetic macular edema; dbh dot blot hemorrhages; CWS cotton wool spot; POAG primary open angle glaucoma; C/D cup-to-disc ratio; HVF humphrey visual field; GVF goldmann visual field; OCT optical coherence tomography; IOP intraocular pressure; BRVO Branch retinal vein occlusion; CRVO central retinal vein occlusion; CRAO central retinal artery occlusion; BRAO branch retinal artery occlusion; RT retinal tear; SB  scleral buckle; PPV pars plana vitrectomy; VH Vitreous hemorrhage; PRP panretinal laser photocoagulation; IVK intravitreal kenalog; VMT  vitreomacular traction; MH Macular hole;  NVD neovascularization of the disc; NVE neovascularization elsewhere; AREDS age related eye disease study; ARMD age related macular degeneration; POAG primary open angle glaucoma; EBMD epithelial/anterior basement membrane dystrophy; ACIOL anterior chamber intraocular lens; IOL intraocular lens; PCIOL posterior chamber intraocular lens; Phaco/IOL phacoemulsification with intraocular lens placement; [place] photorefractive keratectomy; LASIK laser assisted in situ keratomileusis; HTN hypertension; DM diabetes mellitus; COPD chronic obstructive pulmonary disease

## 2020-10-13 NOTE — Assessment & Plan Note (Signed)
Today some 12 days post focal laser for residual CSME extra foveal, stable again today.  Will observe hereafter OD

## 2020-11-17 ENCOUNTER — Other Ambulatory Visit: Payer: Self-pay

## 2020-11-17 ENCOUNTER — Ambulatory Visit (INDEPENDENT_AMBULATORY_CARE_PROVIDER_SITE_OTHER): Payer: Medicare Other | Admitting: Ophthalmology

## 2020-11-17 ENCOUNTER — Encounter (INDEPENDENT_AMBULATORY_CARE_PROVIDER_SITE_OTHER): Payer: Self-pay | Admitting: Ophthalmology

## 2020-11-17 DIAGNOSIS — E113411 Type 2 diabetes mellitus with severe nonproliferative diabetic retinopathy with macular edema, right eye: Secondary | ICD-10-CM

## 2020-11-17 DIAGNOSIS — R0683 Snoring: Secondary | ICD-10-CM | POA: Insufficient documentation

## 2020-11-17 DIAGNOSIS — E113412 Type 2 diabetes mellitus with severe nonproliferative diabetic retinopathy with macular edema, left eye: Secondary | ICD-10-CM | POA: Diagnosis not present

## 2020-11-17 MED ORDER — BEVACIZUMAB 2.5 MG/0.1ML IZ SOSY
2.5000 mg | PREFILLED_SYRINGE | INTRAVITREAL | Status: AC | PRN
  Administered 2020-11-17: 2.5 mg via INTRAVITREAL

## 2020-11-17 NOTE — Assessment & Plan Note (Signed)
The nature of diabetic macular edema was discussed with the patient. Treatment options were outlined including medical therapy, laser & vitrectomy. The use of injectable medications reviewed, including Avastin, Lucentis, and Eylea. Periodic injections into the eye are likely to resolve diabetic macular edema (swelling in the center of vision). Initially, injections are delivered are delivered every 4-6 weeks, and the interval extended as the condition improves. On average, 8-9 injections the first year, and 5 in year 2. Improvement in the condition most often improves on medical therapy. Occasional use of focal laser is also recommended for residual macular edema (swelling). Excellent control of blood glucose and blood pressure are encouraged under the care of a primary physician or endocrinologist. Similarly, attempts to maintain serum cholesterol, low density lipoproteins, and high-density lipoproteins in a favorable range were recommended.  OS On-Q 5-week follow-up interval worsening despite antivegF.  This does raise the specter of placating factors of nightly hypoxic damage possibly from sleep apnea  Review of systems positive for sleep apnea, I have urged the patient to seek home sleep study testing via his primary care provider  OS, repeat intravitreal Avastin today

## 2020-11-17 NOTE — Assessment & Plan Note (Signed)
OD overall improved yet still focal on recurrence and worsening of CSME superior to FAZ despite previous PRP and antivegF, 7 weeks and 12 weeks previous respectively

## 2020-11-17 NOTE — Assessment & Plan Note (Signed)
I urged patient to have sleep study testing performed.  Preferably initial home sleep study to look for sleep apnea.  If sleep apnea is present I have urged the patient to seek prompt therapy so as to enjoy the benefits of preventing nightly hypoxic damage to the CNS system but more importantly to the macula and retina order to assist in clearance of diabetic CSME

## 2020-11-17 NOTE — Progress Notes (Signed)
11/17/2020     CHIEF COMPLAINT Patient presents for  Chief Complaint  Patient presents with   Retina Follow Up      HISTORY OF PRESENT ILLNESS: Cameron Glover is a 70 y.o. male who presents to the clinic today for:   HPI     Retina Follow Up   Patient presents with  Diabetic Retinopathy.  In both eyes.  This started 5 weeks ago.  Duration of 5 weeks.  Since onset it is gradually worsening.        Comments   5 week f/u OU with OCT and possible Avastin injection OS.  Pt c/o worsening vision in the left eye.      Last edited by Reather Littler, COA on 11/17/2020  9:20 AM.      Referring physician: [name], PA-C [address] [place],  [place] [address]  HISTORICAL INFORMATION:   Selected notes from the MEDICAL RECORD NUMBER    Lab Results  Component Value Date   HGBA1C 6.1 (H) 08/11/2020     CURRENT MEDICATIONS: Current Outpatient Medications (Ophthalmic Drugs)  Medication Sig   Prednisol Ace-Moxiflox-Bromfen 1-0.5-0.075 % SUSP Place 1 drop into the left eye 4 (four) times daily.   No current facility-administered medications for this visit. (Ophthalmic Drugs)   Current Outpatient Medications (Other)  Medication Sig   Accu-Chek Softclix Lancets lancets Use as instructed to check blood sugar   amLODipine (NORVASC) 10 MG tablet Take 1 tablet (10 mg total) by mouth daily.   atorvastatin (LIPITOR) 40 MG tablet TAKE 1 TABLET BY MOUTH EVERY DAY   Blood Glucose Monitoring Suppl (ACCU-CHEK GUIDE ME) w/Device KIT by Does not apply route. Sample glucometer provided in office   glucose blood (ACCU-CHEK GUIDE) test strip Use as instructed to check blood sugar   lisinopril-hydrochlorothiazide (ZESTORETIC) 20-12.5 MG tablet Take 1 tablet by mouth daily.   metFORMIN (GLUCOPHAGE) 1000 MG tablet TAKE 1 TABLET BY MOUTH TWICE A DAY WITH A MEAL   pantoprazole (PROTONIX) 40 MG tablet TAKE 1 TABLET BY MOUTH EVERY DAY   No current facility-administered  medications for this visit. (Other)      REVIEW OF SYSTEMS:    ALLERGIES No Known Allergies  PAST MEDICAL HISTORY Past Medical History:  Diagnosis Date   Acute renal failure (ARF) (HCC)    AKI (acute kidney injury) ([place]) 09/02/2017   Anemia due to chronic kidney disease 04/15/2018   DM (diabetes mellitus) with complications ([place]) 0/26/3785   Elevated lipase 04/15/2018   Hyperglycemia without ketosis    Hyperlipidemia    Hypertension    Nuclear sclerotic cataract of left eye 05/14/2019   Past Surgical History:  Procedure Laterality Date   COLONOSCOPY WITH PROPOFOL N/A 01/15/2019   Procedure: COLONOSCOPY WITH PROPOFOL;  Surgeon: Jonathon Bellows, MD;  Location: [facility] ENDOSCOPY;  Service: Gastroenterology;  Laterality: N/A;    FAMILY HISTORY Family History  Problem Relation Age of Onset   Kidney failure Sister    Cancer Sister    Diabetes Sister    Hypertension Sister    Hyperlipidemia Sister    Heart disease Sister    Kidney disease Sister    Cancer Brother    CAD Brother    Diabetes Brother    Hypertension Brother    Hyperlipidemia Brother    Heart disease Brother    Diabetes Other    Cancer Mother     SOCIAL HISTORY Social History   Tobacco Use   Smoking status: Never  Smokeless tobacco: Never  Vaping Use   Vaping Use: Never used  Substance Use Topics   Alcohol use: Never   Drug use: Never         OPHTHALMIC EXAM:  Base Eye Exam     Visual Acuity (ETDRS)       Right Left   Dist [place] 20/20 -1 20/63 -2         Tonometry (Tonopen, 9:26 AM)       Right Left   Pressure 13 12         Pupils       Pupils Dark Light Shape React APD   Right PERRL 4 3 Round Brisk None   Left PERRL 4 3 Round Brisk None         Visual Fields (Counting fingers)       Left Right    Full Full         Extraocular Movement       Right Left    Full, Ortho Full, Ortho         Neuro/Psych     Oriented x3: Yes   Mood/Affect: Normal          Dilation     Both eyes: 1.0% Mydriacyl, 2.5% Phenylephrine @ 9:26 AM           Slit Lamp and Fundus Exam     External Exam       Right Left   External Normal Normal         Slit Lamp Exam       Right Left   Lids/Lashes Normal Normal   Conjunctiva/Sclera White and quiet White and quiet   Cornea Clear Clear   Anterior Chamber Deep and quiet Deep and quiet   Iris Round and reactive Round and reactive   Lens 2.5+ Nuclear sclerosis Centered posterior chamber intraocular lens   Anterior Vitreous Normal Normal         Fundus Exam       Right Left   Posterior Vitreous Normal Posterior vitreous detachment   Disc Normal Normal   C/D Ratio 0.5 0.5   Macula Microaneurysms, Mild clinically significant macular edema superiorly  Microaneurysms, , Mild clinically significant macular edema   Vessels   NPDR severe   Periphery Cotton wool spots  Normal            IMAGING AND PROCEDURES  Imaging and Procedures for 11/17/20  OCT, Retina - OU - Both Eyes       Right Eye Quality was good. Scan locations included subfoveal. Central Foveal Thickness: 338. Progression has improved. Findings include abnormal foveal contour, vitreomacular adhesion .   Left Eye Quality was good. Scan locations included subfoveal. Central Foveal Thickness: 819. Progression has worsened. Findings include vitreomacular adhesion , abnormal foveal contour.   Notes  OD  with focal CSME SUPERIOR to faz, remained stable post focal laser treatment right eye, and now slightly increased some 5 weeks post focal and 7 weeks post injection   OS with center involved CSME now worsened at 5 weeks and post Avastin yet stable previously at 5-week interval.     Intravitreal Injection, Pharmacologic Agent - OS - Left Eye       Time Out 11/17/2020. 10:15 AM. Confirmed correct patient, procedure, site, and patient consented.   Anesthesia Topical anesthesia was used. Anesthetic medications included Lidocaine  4%.   Procedure Preparation included 10% betadine to eyelids, 5% betadine to ocular surface, Ofloxacin . A 30  gauge needle was used.   Injection: 2.5 mg bevacizumab 2.5 MG/0.1ML   Route: Intravitreal, Site: Left Eye   NDC: [id number], Lot: [id number]   Post-op Post injection exam found visual acuity of at least counting fingers. The patient tolerated the procedure well. There were no complications. The patient received written and verbal post procedure care education. Post injection medications included ocuflox.              ASSESSMENT/PLAN:  Severe nonproliferative diabetic retinopathy of left eye, with macular edema, associated with type 2 diabetes mellitus ([place])  The nature of diabetic macular edema was discussed with the patient. Treatment options were outlined including medical therapy, laser & vitrectomy. The use of injectable medications reviewed, including Avastin, Lucentis, and Eylea. Periodic injections into the eye are likely to resolve diabetic macular edema (swelling in the center of vision). Initially, injections are delivered are delivered every 4-6 weeks, and the interval extended as the condition improves. On average, 8-9 injections the first year, and 5 in year 2. Improvement in the condition most often improves on medical therapy. Occasional use of focal laser is also recommended for residual macular edema (swelling). Excellent control of blood glucose and blood pressure are encouraged under the care of a primary physician or endocrinologist. Similarly, attempts to maintain serum cholesterol, low density lipoproteins, and high-density lipoproteins in a favorable range were recommended.  OS On-Q 5-week follow-up interval worsening despite antivegF.  This does raise the specter of placating factors of nightly hypoxic damage possibly from sleep apnea  Review of systems positive for sleep apnea, I have urged the patient to seek home sleep study testing via his primary care  provider  OS, repeat intravitreal Avastin today  Severe nonproliferative diabetic retinopathy of right eye, with macular edema, associated with type 2 diabetes mellitus (HCC) OD overall improved yet still focal on recurrence and worsening of CSME superior to FAZ despite previous PRP and antivegF, 7 weeks and 12 weeks previous respectively  Snores I urged patient to have sleep study testing performed.  Preferably initial home sleep study to look for sleep apnea.  If sleep apnea is present I have urged the patient to seek prompt therapy so as to enjoy the benefits of preventing nightly hypoxic damage to the CNS system but more importantly to the macula and retina order to assist in clearance of diabetic CSME     ICD-10-CM   1. Severe nonproliferative diabetic retinopathy of left eye, with macular edema, associated with type 2 diabetes mellitus (HCC)  L37.3428 OCT, Retina - OU - Both Eyes    Intravitreal Injection, Pharmacologic Agent - OS - Left Eye    bevacizumab (AVASTIN) SOSY 2.5 mg    2. Severe nonproliferative diabetic retinopathy of right eye, with macular edema, associated with type 2 diabetes mellitus ([place])  E11.3411     3. Snores  R06.83       1.  OS with worsening of diabetic CSME.  This does raise the specter of exacerbating factor such as undiagnosed or untreated sleep apnea.  At 5-week interval today repeat intravitreal Avastin may need steroid, Ozurdex in the future  2.  OD with worsening and recurrence of CSME will follow-up in 5 weeks dilate OD next and intravitreal Avastin  3.  After long review of the potential benefits of sleep apnea testing patient agreed he will call his prior primary care provider and make arrangements  Ophthalmic Meds Ordered this visit:  Meds ordered this encounter  Medications   bevacizumab (  AVASTIN) SOSY 2.5 mg       Return in about 5 weeks (around 12/22/2020) for DILATE OU, AVASTIN OCT, OD,, and dilate OS next in 6 to 7 weeks.  There  are no Patient Instructions on file for this visit.   Explained the diagnoses, plan, and follow up with the patient and they expressed understanding.  Patient expressed understanding of the importance of proper follow up care.   Clent Demark Hafiz Irion M.D. Diseases & Surgery of the Retina and Vitreous Retina & Diabetic [place] 11/17/20     Abbreviations: M myopia (nearsighted); A astigmatism; H hyperopia (farsighted); P presbyopia; Mrx spectacle prescription;  CTL contact lenses; OD right eye; OS left eye; OU both eyes  XT exotropia; ET esotropia; PEK punctate epithelial keratitis; PEE punctate epithelial erosions; DES dry eye syndrome; MGD meibomian gland dysfunction; ATs artificial tears; PFAT's preservative free artificial tears; [place] nuclear sclerotic cataract; PSC posterior subcapsular cataract; ERM epi-retinal membrane; PVD posterior vitreous detachment; RD retinal detachment; DM diabetes mellitus; DR diabetic retinopathy; NPDR non-proliferative diabetic retinopathy; PDR proliferative diabetic retinopathy; CSME clinically significant macular edema; DME diabetic macular edema; dbh dot blot hemorrhages; CWS cotton wool spot; POAG primary open angle glaucoma; C/D cup-to-disc ratio; HVF humphrey visual field; GVF goldmann visual field; OCT optical coherence tomography; IOP intraocular pressure; BRVO Branch retinal vein occlusion; CRVO central retinal vein occlusion; CRAO central retinal artery occlusion; BRAO branch retinal artery occlusion; RT retinal tear; SB scleral buckle; PPV pars plana vitrectomy; VH Vitreous hemorrhage; PRP panretinal laser photocoagulation; IVK intravitreal kenalog; VMT vitreomacular traction; MH Macular hole;  NVD neovascularization of the disc; NVE neovascularization elsewhere; AREDS age related eye disease study; ARMD age related macular degeneration; POAG primary open angle glaucoma; EBMD epithelial/anterior basement membrane dystrophy; ACIOL anterior chamber intraocular lens;  IOL intraocular lens; PCIOL posterior chamber intraocular lens; Phaco/IOL phacoemulsification with intraocular lens placement; [place] photorefractive keratectomy; LASIK laser assisted in situ keratomileusis; HTN hypertension; DM diabetes mellitus; COPD chronic obstructive pulmonary disease

## 2020-11-24 ENCOUNTER — Telehealth: Payer: Self-pay

## 2020-11-24 DIAGNOSIS — E113411 Type 2 diabetes mellitus with severe nonproliferative diabetic retinopathy with macular edema, right eye: Secondary | ICD-10-CM

## 2020-11-24 DIAGNOSIS — E113412 Type 2 diabetes mellitus with severe nonproliferative diabetic retinopathy with macular edema, left eye: Secondary | ICD-10-CM

## 2020-11-24 DIAGNOSIS — R0683 Snoring: Secondary | ICD-10-CM

## 2020-11-24 DIAGNOSIS — R29818 Other symptoms and signs involving the nervous system: Secondary | ICD-10-CM

## 2020-11-24 NOTE — Telephone Encounter (Signed)
Copied from [place] [id number]. Topic: General - Other >> Nov 24, 2020 11:54 AM Leward Quan A wrote: Reason for CRM: Patient called in to inform [name] that he was diagnosed with Sleep Apnea and the eye doctor told him to call in and let Ms Lucio Edward know  and need to know what the next step will be. Patient can be reached at  Ph# [phone]

## 2020-11-24 NOTE — Telephone Encounter (Signed)
Pt states Dr.Rankin diagnosed him with sleep apnea, he will be scheduling an OV to discuss.

## 2020-11-24 NOTE — Addendum Note (Signed)
Addended by: [name] on: 11/24/2020 04:43 PM   Modules accepted: Orders

## 2020-11-25 NOTE — Telephone Encounter (Signed)
Spoke to pt and he wants to keep his appt for next week

## 2020-12-02 ENCOUNTER — Ambulatory Visit: Payer: Medicare Other | Admitting: Nurse Practitioner

## 2020-12-22 ENCOUNTER — Encounter (INDEPENDENT_AMBULATORY_CARE_PROVIDER_SITE_OTHER): Payer: Self-pay | Admitting: Ophthalmology

## 2020-12-22 ENCOUNTER — Ambulatory Visit (INDEPENDENT_AMBULATORY_CARE_PROVIDER_SITE_OTHER): Payer: Medicare Other | Admitting: Ophthalmology

## 2020-12-22 ENCOUNTER — Other Ambulatory Visit: Payer: Self-pay

## 2020-12-22 DIAGNOSIS — E113411 Type 2 diabetes mellitus with severe nonproliferative diabetic retinopathy with macular edema, right eye: Secondary | ICD-10-CM

## 2020-12-22 DIAGNOSIS — H2511 Age-related nuclear cataract, right eye: Secondary | ICD-10-CM

## 2020-12-22 DIAGNOSIS — E113412 Type 2 diabetes mellitus with severe nonproliferative diabetic retinopathy with macular edema, left eye: Secondary | ICD-10-CM | POA: Diagnosis not present

## 2020-12-22 DIAGNOSIS — H43823 Vitreomacular adhesion, bilateral: Secondary | ICD-10-CM

## 2020-12-22 MED ORDER — BEVACIZUMAB 2.5 MG/0.1ML IZ SOSY
2.5000 mg | PREFILLED_SYRINGE | INTRAVITREAL | Status: AC | PRN
Start: 2020-12-22 — End: 2020-12-22
  Administered 2020-12-22: 10:00:00 2.5 mg via INTRAVITREAL

## 2020-12-22 NOTE — Assessment & Plan Note (Signed)
Much improved center involved CSME OD and 4 monnths post most recent injection.  Yet with activity superiorly with some mild center involvement

## 2020-12-22 NOTE — Progress Notes (Signed)
12/22/2020     CHIEF COMPLAINT Patient presents for  Chief Complaint  Patient presents with   Retina Follow Up      HISTORY OF PRESENT ILLNESS: Cameron Glover is a 69 y.o. male who presents to the clinic today for:   HPI     Retina Follow Up           Diagnosis: Diabetic Retinopathy   Laterality: right eye   Onset: 5 weeks ago   Severity: severe   Duration: 5 weeks   Course: stable         Comments   5 week fu OU oct avastin OD.  At 5 weeks post injection Avastin OS, improved acuity noted Patient states vision is stable and unchanged since last visit. Denies any new floaters or FOL.       Last edited by Hurman Horn, MD on 12/22/2020 10:14 AM.      Referring physician: Warden Fillers, MD [address] [place],  [place] [id number]  HISTORICAL INFORMATION:   Selected notes from the MEDICAL RECORD NUMBER    Lab Results  Component Value Date   HGBA1C 6.1 (H) 08/11/2020     CURRENT MEDICATIONS: Current Outpatient Medications (Ophthalmic Drugs)  Medication Sig   Prednisol Ace-Moxiflox-Bromfen 1-0.5-0.075 % SUSP Place 1 drop into the left eye 4 (four) times daily.   No current facility-administered medications for this visit. (Ophthalmic Drugs)   Current Outpatient Medications (Other)  Medication Sig   Accu-Chek Softclix Lancets lancets Use as instructed to check blood sugar   amLODipine (NORVASC) 10 MG tablet Take 1 tablet (10 mg total) by mouth daily.   atorvastatin (LIPITOR) 40 MG tablet TAKE 1 TABLET BY MOUTH EVERY DAY   Blood Glucose Monitoring Suppl (ACCU-CHEK GUIDE ME) w/Device KIT by Does not apply route. Sample glucometer provided in office   glucose blood (ACCU-CHEK GUIDE) test strip Use as instructed to check blood sugar   lisinopril-hydrochlorothiazide (ZESTORETIC) 20-12.5 MG tablet Take 1 tablet by mouth daily.   metFORMIN (GLUCOPHAGE) 1000 MG tablet TAKE 1 TABLET BY MOUTH TWICE A DAY WITH A MEAL   pantoprazole (PROTONIX) 40 MG  tablet TAKE 1 TABLET BY MOUTH EVERY DAY   No current facility-administered medications for this visit. (Other)      REVIEW OF SYSTEMS:    ALLERGIES No Known Allergies  PAST MEDICAL HISTORY Past Medical History:  Diagnosis Date   Acute renal failure (ARF) (HCC)    AKI (acute kidney injury) ([place]) 09/02/2017   Anemia due to chronic kidney disease 04/15/2018   DM (diabetes mellitus) with complications ([place]) 1/94/1740   Elevated lipase 04/15/2018   Hyperglycemia without ketosis    Hyperlipidemia    Hypertension    Nuclear sclerotic cataract of left eye 05/14/2019   Past Surgical History:  Procedure Laterality Date   COLONOSCOPY WITH PROPOFOL N/A 01/15/2019   Procedure: COLONOSCOPY WITH PROPOFOL;  Surgeon: Jonathon Bellows, MD;  Location: [facility] ENDOSCOPY;  Service: Gastroenterology;  Laterality: N/A;    FAMILY HISTORY Family History  Problem Relation Age of Onset   Kidney failure Sister    Cancer Sister    Diabetes Sister    Hypertension Sister    Hyperlipidemia Sister    Heart disease Sister    Kidney disease Sister    Cancer Brother    CAD Brother    Diabetes Brother    Hypertension Brother    Hyperlipidemia Brother    Heart disease Brother    Diabetes Other  Cancer Mother     SOCIAL HISTORY Social History   Tobacco Use   Smoking status: Never   Smokeless tobacco: Never  Vaping Use   Vaping Use: Never used  Substance Use Topics   Alcohol use: Never   Drug use: Never         OPHTHALMIC EXAM:  Base Eye Exam     Visual Acuity (ETDRS)       Right Left   Dist [place] 20/20 -2 20/50 -1   Dist ph [place]  20/40         Tonometry (Tonopen, 9:32 AM)       Right Left   Pressure 16 13         Pupils       Pupils APD   Right PERRL None   Left PERRL None         Extraocular Movement       Right Left    Full Full         Neuro/Psych     Oriented x3: Yes   Mood/Affect: Normal         Dilation     Both eyes: 1.0% Mydriacyl, 2.5%  Phenylephrine @ 9:32 AM           Slit Lamp and Fundus Exam     External Exam       Right Left   External Normal Normal         Slit Lamp Exam       Right Left   Lids/Lashes Normal Normal   Conjunctiva/Sclera White and quiet White and quiet   Cornea Clear Clear   Anterior Chamber Deep and quiet Deep and quiet   Iris Round and reactive Round and reactive   Lens 2.5+ Nuclear sclerosis Centered posterior chamber intraocular lens   Anterior Vitreous Normal Normal         Fundus Exam       Right Left   Posterior Vitreous Normal Posterior vitreous detachment   Disc Normal Normal   C/D Ratio 0.5 0.5   Macula Microaneurysms, Mild clinically significant macular edema superiorly Microaneurysms, , Mild clinically significant macular edema   Vessels , NPDR-Severe NPDR severe   Periphery Cotton wool spots  Normal            IMAGING AND PROCEDURES  Imaging and Procedures for 12/22/20  Intravitreal Injection, Pharmacologic Agent - OD - Right Eye       Time Out 12/22/2020. 10:16 AM. Confirmed correct patient, procedure, site, and patient consented.   Anesthesia Topical anesthesia was used. Anesthetic medications included Lidocaine 4%.   Procedure Preparation included 5% betadine to ocular surface, 10% betadine to eyelids, Tobramycin 0.3%. A 30 gauge needle was used.   Injection: 2.5 mg bevacizumab 2.5 MG/0.1ML   Route: Intravitreal, Site: Right Eye   NDC: [phone], Lot: [id number]   Post-op Post injection exam found visual acuity of at least counting fingers. The patient tolerated the procedure well. There were no complications. The patient received written and verbal post procedure care education. Post injection medications were not given.      OCT, Retina - OU - Both Eyes       Right Eye Quality was good. Scan locations included subfoveal. Central Foveal Thickness: 328. Progression has improved. Findings include abnormal foveal contour, vitreomacular  adhesion .   Left Eye Quality was good. Scan locations included subfoveal. Central Foveal Thickness: 341. Progression has improved. Findings include vitreomacular adhesion , abnormal foveal contour.  Notes  OD  with focal CSME SUPERIOR to faz, much improved over the years compared to November, remained stable post focal laser treatment right eye, 5  weeks post injection, improved overall  OS vastly improved some 5 weeks post most recent injection                ASSESSMENT/PLAN:  Severe nonproliferative diabetic retinopathy of left eye, with macular edema, associated with type 2 diabetes mellitus ([place]) Massive subfoveal CSME with serous retinal detachment as recent as November 2020 to onset September 2022.  Now under control with intravitreal Avastin.  Weeks post most recent injection we will schedule follow-up next week as planned  Severe nonproliferative diabetic retinopathy of right eye, with macular edema, associated with type 2 diabetes mellitus ([place]) Much improved center involved CSME OD and 4 monnths post most recent injection.  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 Katy Fitch and the patient     ICD-10-CM   1. Severe nonproliferative diabetic retinopathy of right eye, with macular edema, associated with type 2 diabetes mellitus (HCC)  E11.3411 Intravitreal Injection, Pharmacologic Agent - OD - Right Eye    OCT, Retina - OU - Both Eyes    bevacizumab (AVASTIN) SOSY 2.5 mg    2. Severe nonproliferative diabetic retinopathy of left eye, with macular edema, associated with type 2 diabetes mellitus ([place])  X64.6803     3. Vitreomacular adhesion of both eyes  H43.823     4. Nuclear sclerotic cataract of right eye  H25.11       1.  We will repeat intravitreal Avastin OD today to resolve the  region superior to the FAZ but also nearing and into the FAZ of CSME  2.  Follow-up next week as scheduled OS for massively improved CSME  3.  Ophthalmic Meds Ordered this visit:  Meds ordered this encounter  Medications   bevacizumab (AVASTIN) SOSY 2.5 mg       Return in about 5 weeks (around 01/26/2021) for dilate, OD, AVASTIN OCT,, and follow-up OS as scheduled.  There are no Patient Instructions on file for this visit.   Explained the diagnoses, plan, and follow up with the patient and they expressed understanding.  Patient expressed understanding of the importance of proper follow up care.   Clent Demark Telitha Plath M.D. Diseases & Surgery of the Retina and Vitreous Retina & Diabetic [place] 12/22/20     Abbreviations: M myopia (nearsighted); A astigmatism; H hyperopia (farsighted); P presbyopia; Mrx spectacle prescription;  CTL contact lenses; OD right eye; OS left eye; OU both eyes  XT exotropia; ET esotropia; PEK punctate epithelial keratitis; PEE punctate epithelial erosions; DES dry eye syndrome; MGD meibomian gland dysfunction; ATs artificial tears; PFAT's preservative free artificial tears; [place] nuclear sclerotic cataract; PSC posterior subcapsular cataract; ERM epi-retinal membrane; PVD posterior vitreous detachment; RD retinal detachment; DM diabetes mellitus; DR diabetic retinopathy; NPDR non-proliferative diabetic retinopathy; PDR proliferative diabetic retinopathy; CSME clinically significant macular edema; DME diabetic macular edema; dbh dot blot hemorrhages; CWS cotton wool spot; POAG primary open angle glaucoma; C/D cup-to-disc ratio; HVF humphrey visual field; GVF goldmann visual field; OCT optical coherence tomography; IOP intraocular pressure; BRVO Branch retinal vein occlusion; CRVO central retinal vein occlusion; CRAO central retinal artery occlusion; BRAO branch retinal artery occlusion; RT retinal tear; SB scleral buckle; PPV  pars plana vitrectomy; VH Vitreous  hemorrhage; PRP panretinal laser photocoagulation; IVK intravitreal kenalog; VMT vitreomacular traction; MH Macular hole;  NVD neovascularization of the disc; NVE neovascularization elsewhere; AREDS age related eye disease study; ARMD age related macular degeneration; POAG primary open angle glaucoma; EBMD epithelial/anterior basement membrane dystrophy; ACIOL anterior chamber intraocular lens; IOL intraocular lens; PCIOL posterior chamber intraocular lens; Phaco/IOL phacoemulsification with intraocular lens placement; [place] photorefractive keratectomy; LASIK laser assisted in situ keratomileusis; HTN hypertension; DM diabetes mellitus; COPD chronic obstructive pulmonary disease

## 2020-12-22 NOTE — Assessment & Plan Note (Signed)
Persistent OU

## 2020-12-22 NOTE — Assessment & Plan Note (Signed)
May proceed with CE IOL OD at any time at the discretion of Dr. Katy Fitch and the patient

## 2020-12-22 NOTE — Assessment & Plan Note (Signed)
Massive subfoveal CSME with serous retinal detachment as recent as November 2020 to onset September 2022.  Now under control with intravitreal Avastin.  Weeks post most recent injection we will schedule follow-up next week as planned

## 2020-12-30 ENCOUNTER — Encounter (INDEPENDENT_AMBULATORY_CARE_PROVIDER_SITE_OTHER): Payer: Medicare Other | Admitting: Ophthalmology

## 2021-01-01 ENCOUNTER — Encounter (INDEPENDENT_AMBULATORY_CARE_PROVIDER_SITE_OTHER): Payer: Self-pay | Admitting: Ophthalmology

## 2021-01-01 ENCOUNTER — Other Ambulatory Visit: Payer: Self-pay

## 2021-01-01 ENCOUNTER — Ambulatory Visit (INDEPENDENT_AMBULATORY_CARE_PROVIDER_SITE_OTHER): Payer: Medicare Other | Admitting: Ophthalmology

## 2021-01-01 DIAGNOSIS — R0683 Snoring: Secondary | ICD-10-CM | POA: Diagnosis not present

## 2021-01-01 DIAGNOSIS — E113411 Type 2 diabetes mellitus with severe nonproliferative diabetic retinopathy with macular edema, right eye: Secondary | ICD-10-CM | POA: Diagnosis not present

## 2021-01-01 DIAGNOSIS — H2511 Age-related nuclear cataract, right eye: Secondary | ICD-10-CM

## 2021-01-01 DIAGNOSIS — E113412 Type 2 diabetes mellitus with severe nonproliferative diabetic retinopathy with macular edema, left eye: Secondary | ICD-10-CM

## 2021-01-01 MED ORDER — AFLIBERCEPT 2MG/0.05ML IZ SOLN FOR KALEIDOSCOPE
2.0000 mg | INTRAVITREAL | Status: AC | PRN
  Administered 2021-01-01: 09:00:00 2 mg via INTRAVITREAL

## 2021-01-01 NOTE — Assessment & Plan Note (Signed)
Patient has appointment scheduled with a physician for evaluation of possible sleep apnea and possible home sleep study test to be arranged.

## 2021-01-01 NOTE — Progress Notes (Signed)
01/01/2021     CHIEF COMPLAINT Patient presents for  Chief Complaint  Patient presents with   Retina Follow Up      HISTORY OF PRESENT ILLNESS: Cameron Glover is a 70 y.o. male who presents to the clinic today for:   HPI     Retina Follow Up           Diagnosis: Diabetic Retinopathy   Laterality: left eye   Onset: 6 weeks ago   Severity: mild   Duration: 6 weeks   Course: stable         Comments   6 week fu OS and OCT   Pt states VA OU stable since last visit. Pt denies FOL, floaters, or ocular pain OU.  Pt states, "I feel like my vision seems to be better especially in my left eye."  Blood sugars "Have been up and down."       Last edited by Kendra Opitz, COA on 01/01/2021  9:00 AM.      Referring physician: [name], PA-C [address] [place],  [place] [address]  HISTORICAL INFORMATION:   Selected notes from the MEDICAL RECORD NUMBER    Lab Results  Component Value Date   HGBA1C 6.1 (H) 08/11/2020     CURRENT MEDICATIONS: Current Outpatient Medications (Ophthalmic Drugs)  Medication Sig   Prednisol Ace-Moxiflox-Bromfen 1-0.5-0.075 % SUSP Place 1 drop into the left eye 4 (four) times daily.   No current facility-administered medications for this visit. (Ophthalmic Drugs)   Current Outpatient Medications (Other)  Medication Sig   Accu-Chek Softclix Lancets lancets Use as instructed to check blood sugar   amLODipine (NORVASC) 10 MG tablet Take 1 tablet (10 mg total) by mouth daily.   atorvastatin (LIPITOR) 40 MG tablet TAKE 1 TABLET BY MOUTH EVERY DAY   Blood Glucose Monitoring Suppl (ACCU-CHEK GUIDE ME) w/Device KIT by Does not apply route. Sample glucometer provided in office   glucose blood (ACCU-CHEK GUIDE) test strip Use as instructed to check blood sugar   lisinopril-hydrochlorothiazide (ZESTORETIC) 20-12.5 MG tablet Take 1 tablet by mouth daily.   metFORMIN (GLUCOPHAGE) 1000 MG tablet TAKE 1 TABLET BY MOUTH TWICE A DAY  WITH A MEAL   pantoprazole (PROTONIX) 40 MG tablet TAKE 1 TABLET BY MOUTH EVERY DAY   No current facility-administered medications for this visit. (Other)      REVIEW OF SYSTEMS:    ALLERGIES No Known Allergies  PAST MEDICAL HISTORY Past Medical History:  Diagnosis Date   Acute renal failure (ARF) (HCC)    AKI (acute kidney injury) ([place]) 09/02/2017   Anemia due to chronic kidney disease 04/15/2018   DM (diabetes mellitus) with complications ([place]) 8/50/2774   Elevated lipase 04/15/2018   Hyperglycemia without ketosis    Hyperlipidemia    Hypertension    Nuclear sclerotic cataract of left eye 05/14/2019   Past Surgical History:  Procedure Laterality Date   COLONOSCOPY WITH PROPOFOL N/A 01/15/2019   Procedure: COLONOSCOPY WITH PROPOFOL;  Surgeon: Jonathon Bellows, MD;  Location: [facility] ENDOSCOPY;  Service: Gastroenterology;  Laterality: N/A;    FAMILY HISTORY Family History  Problem Relation Age of Onset   Kidney failure Sister    Cancer Sister    Diabetes Sister    Hypertension Sister    Hyperlipidemia Sister    Heart disease Sister    Kidney disease Sister    Cancer Brother    CAD Brother    Diabetes Brother    Hypertension Brother  Hyperlipidemia Brother    Heart disease Brother    Diabetes Other    Cancer Mother     SOCIAL HISTORY Social History   Tobacco Use   Smoking status: Never   Smokeless tobacco: Never  Vaping Use   Vaping Use: Never used  Substance Use Topics   Alcohol use: Never   Drug use: Never         OPHTHALMIC EXAM:  Base Eye Exam     Visual Acuity (ETDRS)       Right Left   Dist [place] 20/20 -1 20/30   Dist ph [place]  20/25 -1    Correction: Glasses         Tonometry (Tonopen, 9:03 AM)       Right Left   Pressure 14 15         Pupils       Pupils Shape React APD   Right PERRL Round Brisk None   Left PERRL Round Brisk None         Visual Fields (Counting fingers)       Left Right    Full Full          Extraocular Movement       Right Left    Full, Ortho Full, Ortho         Neuro/Psych     Oriented x3: Yes   Mood/Affect: Normal         Dilation     Left eye: 1.0% Mydriacyl, 2.5% Phenylephrine @ 9:03 AM           Slit Lamp and Fundus Exam     External Exam       Right Left   External Normal Normal         Slit Lamp Exam       Right Left   Lids/Lashes Normal Normal   Conjunctiva/Sclera White and quiet White and quiet   Cornea Clear Clear   Anterior Chamber Deep and quiet Deep and quiet   Iris Round and reactive Round and reactive   Lens 2.5+ Nuclear sclerosis Centered posterior chamber intraocular lens   Anterior Vitreous Normal Normal         Fundus Exam       Right Left   Posterior Vitreous  Posterior vitreous detachment   Disc  Normal   C/D Ratio  0.5   Macula  Microaneurysms, , Mild clinically significant macular edema   Vessels  NPDR severe   Periphery  Normal            IMAGING AND PROCEDURES  Imaging and Procedures for 01/01/21  OCT, Retina - OU - Both Eyes       Right Eye Quality was good. Scan locations included subfoveal. Central Foveal Thickness: 314. Progression has improved. Findings include abnormal foveal contour, vitreomacular adhesion .   Left Eye Quality was good. Scan locations included subfoveal. Central Foveal Thickness: 391. Progression has improved. Findings include vitreomacular adhesion , abnormal foveal contour.   Notes  OD  with focal CSME SUPERIOR to faz, much improved over the years compared to November, remained stable post focal laser treatment right eye, 1  weeks post injection, improved overall  OS vastly improved overall,   yet 6 weeks post most recent injection, with CSME worsened temporally and inferotemporally.        Intravitreal Injection, Pharmacologic Agent - OS - Left Eye       Time Out 01/01/2021. 9:26 AM. Confirmed correct patient, procedure,  site, and patient consented.    Anesthesia Topical anesthesia was used. Anesthetic medications included Lidocaine 4%.   Procedure Preparation included 10% betadine to eyelids, 5% betadine to ocular surface, Ofloxacin . A 30 gauge needle was used.   Injection: 2 mg aflibercept 2 MG/0.05ML   Route: Intravitreal, Site: Left Eye   NDC: [id number], Lot: [id number], Waste: 0 mL   Post-op Post injection exam found visual acuity of at least counting fingers. The patient tolerated the procedure well. There were no complications. The patient received written and verbal post procedure care education. Post injection medications included ocuflox.              ASSESSMENT/PLAN:  [place] Patient has appointment scheduled with a physician for evaluation of possible sleep apnea and possible home sleep study test to be arranged.  Severe nonproliferative diabetic retinopathy of left eye, with macular edema, associated with type 2 diabetes mellitus ([place]) CSME overall improved yet still recurrent at short interval follow-up on Eylea.  This does suggest there may be superimposed uncontrolled hypertension which could even be episodic as we often see in patients with nightly sleep apnea untreated undiagnosed with nightly macular hypoxia and episodic hypertension  Nuclear sclerotic cataract of right eye Stable OD at this time  Severe nonproliferative diabetic retinopathy of right eye, with macular edema, associated with type 2 diabetes mellitus (HCC) Improved CSME OD, post injection Eylea 1 week     ICD-10-CM   1. Severe nonproliferative diabetic retinopathy of left eye, with macular edema, associated with type 2 diabetes mellitus (HCC)  Y17.4944 OCT, Retina - OU - Both Eyes    Intravitreal Injection, Pharmacologic Agent - OS - Left Eye    aflibercept (EYLEA) SOLN 2 mg    2. Snores  R06.83     3. Nuclear sclerotic cataract of right eye  H25.11     4. Severe nonproliferative diabetic retinopathy of right eye, with macular  edema, associated with type 2 diabetes mellitus ([place])  E11.3411       1.  OD looks good today 1 week post most recent injection antivegF, follow-up OD as scheduled  2.  OS today at 6 weeks postinjection antivegF, still with chronic active CSME center involved.  Repeat injection intravitreal Avastin OS today follow-up again in 6 weeks  3.  Patient does have a evaluation for consideration of sleep study at home with his PCP  Ophthalmic Meds Ordered this visit:  Meds ordered this encounter  Medications   aflibercept (EYLEA) SOLN 2 mg       Return in about 6 weeks (around 02/12/2021) for dilate, OS, and follow-up OD as scheduled, AVASTIN OCT.  There are no Patient Instructions on file for this visit.   Explained the diagnoses, plan, and follow up with the patient and they expressed understanding.  Patient expressed understanding of the importance of proper follow up care.   Clent Demark Renesmae Donahey M.D. Diseases & Surgery of the Retina and Vitreous Retina & Diabetic [place] 01/01/21     Abbreviations: M myopia (nearsighted); A astigmatism; H hyperopia (farsighted); P presbyopia; Mrx spectacle prescription;  CTL contact lenses; OD right eye; OS left eye; OU both eyes  XT exotropia; ET esotropia; PEK punctate epithelial keratitis; PEE punctate epithelial erosions; DES dry eye syndrome; MGD meibomian gland dysfunction; ATs artificial tears; PFAT's preservative free artificial tears; [place] nuclear sclerotic cataract; PSC posterior subcapsular cataract; ERM epi-retinal membrane; PVD posterior vitreous detachment; RD retinal detachment; DM diabetes mellitus; DR diabetic retinopathy; NPDR non-proliferative diabetic  retinopathy; PDR proliferative diabetic retinopathy; CSME clinically significant macular edema; DME diabetic macular edema; dbh dot blot hemorrhages; CWS cotton wool spot; POAG primary open angle glaucoma; C/D cup-to-disc ratio; HVF humphrey visual field; GVF goldmann visual field; OCT optical  coherence tomography; IOP intraocular pressure; BRVO Branch retinal vein occlusion; CRVO central retinal vein occlusion; CRAO central retinal artery occlusion; BRAO branch retinal artery occlusion; RT retinal tear; SB scleral buckle; PPV pars plana vitrectomy; VH Vitreous hemorrhage; PRP panretinal laser photocoagulation; IVK intravitreal kenalog; VMT vitreomacular traction; MH Macular hole;  NVD neovascularization of the disc; NVE neovascularization elsewhere; AREDS age related eye disease study; ARMD age related macular degeneration; POAG primary open angle glaucoma; EBMD epithelial/anterior basement membrane dystrophy; ACIOL anterior chamber intraocular lens; IOL intraocular lens; PCIOL posterior chamber intraocular lens; Phaco/IOL phacoemulsification with intraocular lens placement; [place] photorefractive keratectomy; LASIK laser assisted in situ keratomileusis; HTN hypertension; DM diabetes mellitus; COPD chronic obstructive pulmonary disease

## 2021-01-01 NOTE — Assessment & Plan Note (Signed)
Stable OD at this time

## 2021-01-01 NOTE — Assessment & Plan Note (Signed)
CSME overall improved yet still recurrent at short interval follow-up on Eylea.  This does suggest there may be superimposed uncontrolled hypertension which could even be episodic as we often see in patients with nightly sleep apnea untreated undiagnosed with nightly macular hypoxia and episodic hypertension

## 2021-01-01 NOTE — Assessment & Plan Note (Signed)
Improved CSME OD, post injection Eylea 1 week

## 2021-01-03 ENCOUNTER — Other Ambulatory Visit: Payer: Self-pay | Admitting: Family Medicine

## 2021-01-03 DIAGNOSIS — I160 Hypertensive urgency: Secondary | ICD-10-CM

## 2021-01-03 NOTE — Telephone Encounter (Signed)
Requested Prescriptions  Pending Prescriptions Disp Refills   lisinopril-hydrochlorothiazide (ZESTORETIC) 20-12.5 MG tablet [Pharmacy Med Name: LISINOPRIL-HCTZ 20-12.5 MG TAB] 90 tablet 0    Sig: TAKE 1 TABLET BY MOUTH EVERY DAY     Cardiovascular:  ACEI + Diuretic Combos Failed - 01/03/2021  1:47 AM      Failed - Cr in normal range and within 180 days    Creat  Date Value Ref Range Status  08/11/2020 1.56 (H) 0.70 - 1.35 mg/dL Final         Failed - Ca in normal range and within 180 days    Calcium  Date Value Ref Range Status  08/11/2020 8.2 (L) 8.6 - 10.3 mg/dL Final   Calcium, Ion  Date Value Ref Range Status  03/25/2020 4.65 (L) 4.8 - 5.6 mg/dL Final         Passed - Na in normal range and within 180 days    Sodium  Date Value Ref Range Status  08/11/2020 139 135 - 146 mmol/L Final         Passed - K in normal range and within 180 days    Potassium  Date Value Ref Range Status  08/11/2020 4.5 3.5 - 5.3 mmol/L Final         Passed - Patient is not pregnant      Passed - Last BP in normal range    BP Readings from Last 1 Encounters:  08/11/20 124/78         Passed - Valid encounter within last 6 months    Recent Outpatient Visits          4 months ago Controlled type 2 diabetes mellitus with microalbuminuria, without long-term current use of insulin [facility])   [facility] [place], Kristeen Miss, PA-C   9 months ago Anemia, unspecified type   [place], Adriana M, [place]   10 months ago Controlled type 2 diabetes mellitus with microalbuminuria, unspecified whether long term insulin use [facility])   [facility] [name], PA-C   1 year ago Controlled type 2 diabetes mellitus with microalbuminuria, unspecified whether long term insulin use [facility])   [facility] [name], PA-C   1 year ago Essential hypertension   [facility] [name], PA-C      Future  Appointments            In 1 month [name], PA-C [facility], [place]   In 3 months  [facility], [facility]

## 2021-01-10 ENCOUNTER — Other Ambulatory Visit: Payer: Self-pay | Admitting: Family Medicine

## 2021-01-10 DIAGNOSIS — I160 Hypertensive urgency: Secondary | ICD-10-CM

## 2021-01-10 NOTE — Telephone Encounter (Signed)
Requested Prescriptions  Pending Prescriptions Disp Refills   amLODipine (NORVASC) 10 MG tablet [Pharmacy Med Name: AMLODIPINE BESYLATE 10 MG TAB] 90 tablet 0    Sig: TAKE 1 TABLET BY MOUTH EVERY DAY     Cardiovascular:  Calcium Channel Blockers Passed - 01/10/2021 12:13 AM      Passed - Last BP in normal range    BP Readings from Last 1 Encounters:  08/11/20 124/78         Passed - Valid encounter within last 6 months    Recent Outpatient Visits          5 months ago Controlled type 2 diabetes mellitus with microalbuminuria, without long-term current use of insulin [facility])   [facility] [place], Kristeen Miss, PA-C   9 months ago Anemia, unspecified type   [place], [place]   11 months ago Controlled type 2 diabetes mellitus with microalbuminuria, unspecified whether long term insulin use [facility])   [facility] [name], PA-C   1 year ago Controlled type 2 diabetes mellitus with microalbuminuria, unspecified whether long term insulin use [facility])   [facility] [name], PA-C   1 year ago Essential hypertension   [facility] [name], PA-C      Future Appointments            In 1 month [name], PA-C [facility], [place]   In 2 months  [facility], [facility]

## 2021-01-28 ENCOUNTER — Other Ambulatory Visit: Payer: Self-pay

## 2021-01-28 ENCOUNTER — Encounter (INDEPENDENT_AMBULATORY_CARE_PROVIDER_SITE_OTHER): Payer: Self-pay | Admitting: Ophthalmology

## 2021-01-28 ENCOUNTER — Ambulatory Visit (INDEPENDENT_AMBULATORY_CARE_PROVIDER_SITE_OTHER): Payer: Medicare Other | Admitting: Ophthalmology

## 2021-01-28 DIAGNOSIS — H2511 Age-related nuclear cataract, right eye: Secondary | ICD-10-CM

## 2021-01-28 DIAGNOSIS — E113411 Type 2 diabetes mellitus with severe nonproliferative diabetic retinopathy with macular edema, right eye: Secondary | ICD-10-CM

## 2021-01-28 DIAGNOSIS — E113412 Type 2 diabetes mellitus with severe nonproliferative diabetic retinopathy with macular edema, left eye: Secondary | ICD-10-CM | POA: Diagnosis not present

## 2021-01-28 MED ORDER — BEVACIZUMAB 2.5 MG/0.1ML IZ SOSY
2.5000 mg | PREFILLED_SYRINGE | INTRAVITREAL | Status: AC | PRN
  Administered 2021-01-28: 10:00:00 2.5 mg via INTRAVITREAL

## 2021-01-28 NOTE — Assessment & Plan Note (Signed)
Okay to proceed anytime with cataract surgery in the right eye as CSME well-controlled

## 2021-01-28 NOTE — Assessment & Plan Note (Signed)
The nature of diabetic macular edema was discussed with the patient. Treatment options were outlined including medical therapy, laser & vitrectomy. The use of injectable medications reviewed, including Avastin, Lucentis, and Eylea. Periodic injections into the eye are likely to resolve diabetic macular edema (swelling in the center of vision). Initially, injections are delivered are delivered every 4-6 weeks, and the interval extended as the condition improves. On average, 8-9 injections the first year, and 5 in year 2. Improvement in the condition most often improves on medical therapy. Occasional use of focal laser is also recommended for residual macular edema (swelling). Excellent control of blood glucose and blood pressure are encouraged under the care of a primary physician or endocrinologist. Similarly, attempts to maintain serum cholesterol, low density lipoproteins, and high-density lipoproteins in a favorable range were recommended.   OD vastly improved post injection Avastin but also 3 months post focal laser treatment superior to FAZ.  We will repeat injection today to maintain resolution

## 2021-01-28 NOTE — Assessment & Plan Note (Signed)
At 4 weeks post injection Avastin still with chronic center involved CSME will need to repeat evaluation schedule likely fluorescein angiography as well so as to analyze perfusion and treat nonperfused areas to decrease vegF burden

## 2021-01-28 NOTE — Progress Notes (Signed)
° ° °01/28/2021 ° °  ° °CHIEF COMPLAINT °Patient presents for  °Chief Complaint  °Patient presents with  ° Retina Follow Up  ° ° ° ° °HISTORY OF PRESENT ILLNESS: °Cameron Glover is a 70 y.o. male who presents to the clinic today for:  ° °HPI   ° ° Retina Follow Up   ° °      ° Diagnosis: Diabetic Retinopathy  ° Laterality: right eye  ° Onset: 5 weeks ago  ° Duration: 5 weeks  ° Course: stable  ° °  °  ° ° Comments   °5 weeks dilate OD avastin OD oct. °Patient states vision is stable and unchanged since last visit. Denies any new floaters or FOL. ° ° °  °  °Last edited by Kronstein, Anna N on 01/28/2021  9:24 AM.  °  ° ° °Referring physician: °Groat, Robert, MD °[address] °[address] °[place],  [place] [address] ° °HISTORICAL INFORMATION:  ° °Selected notes from the medical record:  °  ° °Lab Results  °Component Value Date  ° HGBA1C 6.1 (H) 08/11/2020  °  ° °CURRENT MEDICATIONS: °Current Outpatient Medications (Ophthalmic Drugs)  °Medication Sig  ° Prednisol Ace-Moxiflox-Bromfen 1-0.5-0.075 % SUSP Place 1 drop into the left eye 4 (four) times daily.  ° °No current facility-administered medications for this visit. (Ophthalmic Drugs)  ° °Current Outpatient Medications (Other)  °Medication Sig  ° Accu-Chek Softclix Lancets lancets Use as instructed to check blood sugar  ° amLODipine (NORVASC) 10 MG tablet TAKE 1 TABLET BY MOUTH EVERY DAY  ° atorvastatin (LIPITOR) 40 MG tablet TAKE 1 TABLET BY MOUTH EVERY DAY  ° Blood Glucose Monitoring Suppl (ACCU-CHEK GUIDE ME) w/Device KIT by Does not apply route. Sample glucometer provided in office  ° glucose blood (ACCU-CHEK GUIDE) test strip Use as instructed to check blood sugar  ° lisinopril-hydrochlorothiazide (ZESTORETIC) 20-12.5 MG tablet TAKE 1 TABLET BY MOUTH EVERY DAY  ° metFORMIN (GLUCOPHAGE) 1000 MG tablet TAKE 1 TABLET BY MOUTH TWICE A DAY WITH A MEAL  ° pantoprazole (PROTONIX) 40 MG tablet TAKE 1 TABLET BY MOUTH EVERY DAY  ° °No current facility-administered medications for  this visit. (Other)  ° ° ° ° °REVIEW OF SYSTEMS: °ROS   °Negative for: Constitutional, Gastrointestinal, Neurological, Skin, Genitourinary, Musculoskeletal, HENT, Endocrine, Cardiovascular, Eyes, Respiratory, Psychiatric, Allergic/Imm, Heme/Lymph °Last edited by Rankin, Gary A, MD on 01/28/2021 10:18 AM.  °  ° ° ° °ALLERGIES °No Known Allergies ° °PAST MEDICAL HISTORY °Past Medical History:  °Diagnosis Date  ° Acute renal failure (ARF) (HCC)   ° AKI (acute kidney injury) (HCC) 09/02/2017  ° Anemia due to chronic kidney disease 04/15/2018  ° DM (diabetes mellitus) with complications (HCC) 09/02/2017  ° Elevated lipase 04/15/2018  ° Hyperglycemia without ketosis   ° Hyperlipidemia   ° Hypertension   ° Nuclear sclerotic cataract of left eye 05/14/2019  ° °Past Surgical History:  °Procedure Laterality Date  ° COLONOSCOPY WITH PROPOFOL N/A 01/15/2019  ° Procedure: COLONOSCOPY WITH PROPOFOL;  Surgeon: Anna, Kiran, MD;  Location: [facility] ENDOSCOPY;  Service: Gastroenterology;  Laterality: N/A;  ° ° °FAMILY HISTORY °Family History  °Problem Relation Age of Onset  ° Kidney failure Sister   ° Cancer Sister   ° Diabetes Sister   ° Hypertension Sister   ° Hyperlipidemia Sister   ° Heart disease Sister   ° Kidney disease Sister   ° Cancer Brother   ° CAD Brother   ° Diabetes Brother   ° Hypertension   Brother   ° Hyperlipidemia Brother   ° Heart disease Brother   ° Diabetes Other   ° Cancer Mother   ° ° °SOCIAL HISTORY °Social History  ° °Tobacco Use  ° Smoking status: Never  ° Smokeless tobacco: Never  °Vaping Use  ° Vaping Use: Never used  °Substance Use Topics  ° Alcohol use: Never  ° Drug use: Never  ° °  ° °  ° °OPHTHALMIC EXAM: ° °Base Eye Exam   ° ° Visual Acuity (ETDRS)   ° °   Right Left  ° Dist [place] 20/20 -1 20/50 -2  ° Dist ph [place]  20/30 -2  ° °  °  ° ° Tonometry (Tonopen, 9:27 AM)   ° °   Right Left  ° Pressure 16 15  ° °  °  ° ° Pupils   ° °   Pupils APD  ° Right PERRL None  ° Left PERRL None  ° °  °  ° ° Visual Fields   ° °    Left Right  °  Full Full  ° °  °  ° ° Extraocular Movement   ° °   Right Left  °  Full Full  ° °  °  ° ° Neuro/Psych   ° ° Oriented x3: Yes  ° Mood/Affect: Normal  ° °  °  ° ° Dilation   ° ° Right eye: 1.0% Mydriacyl, 2.5% Phenylephrine @ 9:27 AM  ° °  °  ° °  ° °Slit Lamp and Fundus Exam   ° ° External Exam   ° °   Right Left  ° External Normal Normal  ° °  °  ° ° Slit Lamp Exam   ° °   Right Left  ° Lids/Lashes Normal Normal  ° Conjunctiva/Sclera White and quiet White and quiet  ° Cornea Clear Clear  ° Anterior Chamber Deep and quiet Deep and quiet  ° Iris Round and reactive Round and reactive  ° Lens 2.5+ Nuclear sclerosis Centered posterior chamber intraocular lens  ° Anterior Vitreous Normal Normal  ° °  °  ° ° Fundus Exam   ° °   Right Left  ° Posterior Vitreous Normal   ° Disc Normal   ° C/D Ratio 0.5   ° Macula Microaneurysms, Mild clinically significant macular edema superiorly   ° Vessels , NPDR-Severe   ° Periphery Cotton wool spots    ° °  °  ° °  ° ° °IMAGING AND PROCEDURES  °Imaging and Procedures for 01/28/21 ° °Intravitreal Injection, Pharmacologic Agent - OD - Right Eye   ° °   °Time Out °01/28/2021. 10:21 AM. Confirmed correct patient, procedure, site, and patient consented.  ° °Anesthesia °Topical anesthesia was used. Anesthetic medications included Lidocaine 4%.  ° °Procedure °Preparation included 5% betadine to ocular surface, 10% betadine to eyelids, Tobramycin 0.3%. A 30 gauge needle was used.  ° °Injection: °2.5 mg bevacizumab 2.5 MG/0.1ML °  Route: Intravitreal, Site: Right Eye °  NDC: 71449-091-43, Lot: [id number]  ° °Post-op °Post injection exam found visual acuity of at least counting fingers. The patient tolerated the procedure well. There were no complications. The patient received written and verbal post procedure care education. Post injection medications were not given.  ° °  ° °OCT, Retina - OU - Both Eyes   ° °   °Right Eye °Quality was good. Scan locations included subfoveal. Central  Foveal Thickness: 304. Progression has improved.   improved. Findings include abnormal foveal contour, vitreomacular adhesion .   Left Eye Quality was good. Scan locations included subfoveal. Central Foveal Thickness: 413. Progression has improved. Findings include vitreomacular adhesion , abnormal foveal contour.   Notes  OD  with focal CSME SUPERIOR to faz, much improved over the years compared to November, remained stable post focal laser treatment right eye, today at 5 weeks post most recent injection Avastin and some 3 months post focal laser treatment superiorly.  Much improved CSME today.  We will repeat injection today and extend interval examination right eye next    OS vastly improved overall,   yet 4 weeks post most recent injection, with CSME worsened temporally and inferotemporally.  Still very active OS                ASSESSMENT/PLAN:  Severe nonproliferative diabetic retinopathy of right eye, with macular edema, associated with type 2 diabetes mellitus ([place])  The nature of diabetic macular edema was discussed with the patient. Treatment options were outlined including medical therapy, laser & vitrectomy. The use of injectable medications reviewed, including Avastin, Lucentis, and Eylea. Periodic injections into the eye are likely to resolve diabetic macular edema (swelling in the center of vision). Initially, injections are delivered are delivered every 4-6 weeks, and the interval extended as the condition improves. On average, 8-9 injections the first year, and 5 in year 2. Improvement in the condition most often improves on medical therapy. Occasional use of focal laser is also recommended for residual macular edema (swelling). Excellent control of blood glucose and blood pressure are encouraged under the care of a primary physician or endocrinologist. Similarly, attempts to maintain serum cholesterol, low density lipoproteins, and high-density lipoproteins in a favorable range were  recommended.   OD vastly improved post injection Avastin but also 3 months post focal laser treatment superior to FAZ.  We will repeat injection today to maintain resolution  Severe nonproliferative diabetic retinopathy of left eye, with macular edema, associated with type 2 diabetes mellitus (HCC) At 4 weeks post injection Avastin still with chronic center involved CSME will need to repeat evaluation schedule likely fluorescein angiography as well so as to analyze perfusion and treat nonperfused areas to decrease vegF burden  Nuclear sclerotic cataract of right eye Okay to proceed anytime with cataract surgery in the right eye as CSME well-controlled     ICD-10-CM   1. Severe nonproliferative diabetic retinopathy of right eye, with macular edema, associated with type 2 diabetes mellitus (HCC)  E11.3411 Intravitreal Injection, Pharmacologic Agent - OD - Right Eye    OCT, Retina - OU - Both Eyes    bevacizumab (AVASTIN) SOSY 2.5 mg    2. Severe nonproliferative diabetic retinopathy of left eye, with macular edema, associated with type 2 diabetes mellitus ([place])  F64.3329     3. Nuclear sclerotic cataract of right eye  H25.11       1.  Repeat injection intravitreal Avastin OD today to maintain  2.  Dilate OU next, FFA OU, transit, early phase views OS.  Will likely need Avastin OCT OS as well thereafter either focal or PRP left eye to decrease vegF burden to resolve CSME  3.  Follow-up Dr. Katy Fitch, or [facility] eye care for consideration of cataract surgery and intraocular insufflation right eye to maximize acuity  Ophthalmic Meds Ordered this visit:  Meds ordered this encounter  Medications   bevacizumab (AVASTIN) SOSY 2.5 mg       Return  February 8, for  As scheduled, DILATE OU, OPTOS FFA L/R, COLOR FP, AVASTIN OCT, OS.  There are no Patient Instructions on file for this visit.   Explained the diagnoses, plan, and follow up with the patient and they expressed understanding.  Patient  expressed understanding of the importance of proper follow up care.   Clent Demark Danzig Macgregor M.D. Diseases & Surgery of the Retina and Vitreous Retina & Diabetic [place] 01/28/21     Abbreviations: M myopia (nearsighted); A astigmatism; H hyperopia (farsighted); P presbyopia; Mrx spectacle prescription;  CTL contact lenses; OD right eye; OS left eye; OU both eyes  XT exotropia; ET esotropia; PEK punctate epithelial keratitis; PEE punctate epithelial erosions; DES dry eye syndrome; MGD meibomian gland dysfunction; ATs artificial tears; PFAT's preservative free artificial tears; [place] nuclear sclerotic cataract; PSC posterior subcapsular cataract; ERM epi-retinal membrane; PVD posterior vitreous detachment; RD retinal detachment; DM diabetes mellitus; DR diabetic retinopathy; NPDR non-proliferative diabetic retinopathy; PDR proliferative diabetic retinopathy; CSME clinically significant macular edema; DME diabetic macular edema; dbh dot blot hemorrhages; CWS cotton wool spot; POAG primary open angle glaucoma; C/D cup-to-disc ratio; HVF humphrey visual field; GVF goldmann visual field; OCT optical coherence tomography; IOP intraocular pressure; BRVO Branch retinal vein occlusion; CRVO central retinal vein occlusion; CRAO central retinal artery occlusion; BRAO branch retinal artery occlusion; RT retinal tear; SB scleral buckle; PPV pars plana vitrectomy; VH Vitreous hemorrhage; PRP panretinal laser photocoagulation; IVK intravitreal kenalog; VMT vitreomacular traction; MH Macular hole;  NVD neovascularization of the disc; NVE neovascularization elsewhere; AREDS age related eye disease study; ARMD age related macular degeneration; POAG primary open angle glaucoma; EBMD epithelial/anterior basement membrane dystrophy; ACIOL anterior chamber intraocular lens; IOL intraocular lens; PCIOL posterior chamber intraocular lens; Phaco/IOL phacoemulsification with intraocular lens placement; [place] photorefractive keratectomy;  LASIK laser assisted in situ keratomileusis; HTN hypertension; DM diabetes mellitus; COPD chronic obstructive pulmonary disease

## 2021-01-29 ENCOUNTER — Telehealth: Payer: Self-pay

## 2021-01-29 NOTE — Telephone Encounter (Signed)
Called and spoke to patient  who states he has never had a sleep study before. Nothing further needed.

## 2021-02-02 ENCOUNTER — Institutional Professional Consult (permissible substitution): Payer: Medicare Other | Admitting: Pulmonary Disease

## 2021-02-11 ENCOUNTER — Encounter (INDEPENDENT_AMBULATORY_CARE_PROVIDER_SITE_OTHER): Payer: Self-pay | Admitting: Ophthalmology

## 2021-02-11 ENCOUNTER — Ambulatory Visit (INDEPENDENT_AMBULATORY_CARE_PROVIDER_SITE_OTHER): Payer: Medicare Other | Admitting: Ophthalmology

## 2021-02-11 ENCOUNTER — Other Ambulatory Visit: Payer: Self-pay

## 2021-02-11 ENCOUNTER — Encounter (INDEPENDENT_AMBULATORY_CARE_PROVIDER_SITE_OTHER): Payer: Medicare Other | Admitting: Ophthalmology

## 2021-02-11 DIAGNOSIS — E113412 Type 2 diabetes mellitus with severe nonproliferative diabetic retinopathy with macular edema, left eye: Secondary | ICD-10-CM | POA: Diagnosis not present

## 2021-02-11 DIAGNOSIS — E113411 Type 2 diabetes mellitus with severe nonproliferative diabetic retinopathy with macular edema, right eye: Secondary | ICD-10-CM

## 2021-02-11 MED ORDER — FLUORESCEIN SODIUM 10 % IV SOLN
500.0000 mg | INTRAVENOUS | Status: AC | PRN
  Administered 2021-02-11: 500 mg via INTRAVENOUS

## 2021-02-11 MED ORDER — AFLIBERCEPT 2MG/0.05ML IZ SOLN FOR KALEIDOSCOPE
2.0000 mg | INTRAVITREAL | Status: AC | PRN
  Administered 2021-02-11: 2 mg via INTRAVITREAL

## 2021-02-11 NOTE — Assessment & Plan Note (Signed)
Much improved by OCT and also confirmed on FFA today

## 2021-02-11 NOTE — Assessment & Plan Note (Signed)
OS, worsening of central involved CSME on Avastin currently at 5-week interval.  We will change to intravitreal Eylea OS today

## 2021-02-11 NOTE — Progress Notes (Signed)
02/11/2021     CHIEF COMPLAINT Patient presents for  Chief Complaint  Patient presents with   Retina Follow Up      HISTORY OF PRESENT ILLNESS: Cameron Glover is a 71 y.o. male who presents to the clinic today for:   HPI     Retina Follow Up           Diagnosis: Diabetic Retinopathy   Laterality: left eye   Onset: 6 weeks ago   Severity: mild   Duration: 6 weeks   Course: stable         Comments   6 week fu OS and OCT and Eylea OS  Pt states VA OU stable since last visit. Pt denies FOL, floaters, or ocular pain OU.        Last edited by Kendra Opitz, COA on 02/11/2021 10:40 AM.      Referring physician: [name], PA-C [id number] [place],  [place] [address]  HISTORICAL INFORMATION:   Selected notes from the MEDICAL RECORD NUMBER    Lab Results  Component Value Date   HGBA1C 6.1 (H) 08/11/2020     CURRENT MEDICATIONS: Current Outpatient Medications (Ophthalmic Drugs)  Medication Sig   Prednisol Ace-Moxiflox-Bromfen 1-0.5-0.075 % SUSP Place 1 drop into the left eye 4 (four) times daily.   No current facility-administered medications for this visit. (Ophthalmic Drugs)   Current Outpatient Medications (Other)  Medication Sig   Accu-Chek Softclix Lancets lancets Use as instructed to check blood sugar   amLODipine (NORVASC) 10 MG tablet TAKE 1 TABLET BY MOUTH EVERY DAY   atorvastatin (LIPITOR) 40 MG tablet TAKE 1 TABLET BY MOUTH EVERY DAY   Blood Glucose Monitoring Suppl (ACCU-CHEK GUIDE ME) w/Device KIT by Does not apply route. Sample glucometer provided in office   glucose blood (ACCU-CHEK GUIDE) test strip Use as instructed to check blood sugar   lisinopril-hydrochlorothiazide (ZESTORETIC) 20-12.5 MG tablet TAKE 1 TABLET BY MOUTH EVERY DAY   metFORMIN (GLUCOPHAGE) 1000 MG tablet TAKE 1 TABLET BY MOUTH TWICE A DAY WITH A MEAL   pantoprazole (PROTONIX) 40 MG tablet TAKE 1 TABLET BY MOUTH EVERY DAY   No current facility-administered  medications for this visit. (Other)      REVIEW OF SYSTEMS: ROS   Negative for: Constitutional, Gastrointestinal, Neurological, Skin, Genitourinary, Musculoskeletal, HENT, Endocrine, Cardiovascular, Eyes, Respiratory, Psychiatric, Allergic/Imm, Heme/Lymph Last edited by Hurman Horn, MD on 02/11/2021 11:30 AM.       ALLERGIES No Known Allergies  PAST MEDICAL HISTORY Past Medical History:  Diagnosis Date   Acute renal failure (ARF) ([place])    AKI (acute kidney injury) ([place]) 09/02/2017   Anemia due to chronic kidney disease 04/15/2018   DM (diabetes mellitus) with complications ([place]) 8/67/6720   Elevated lipase 04/15/2018   Hyperglycemia without ketosis    Hyperlipidemia    Hypertension    Nuclear sclerotic cataract of left eye 05/14/2019   Past Surgical History:  Procedure Laterality Date   COLONOSCOPY WITH PROPOFOL N/A 01/15/2019   Procedure: COLONOSCOPY WITH PROPOFOL;  Surgeon: Jonathon Bellows, MD;  Location: [facility] ENDOSCOPY;  Service: Gastroenterology;  Laterality: N/A;    FAMILY HISTORY Family History  Problem Relation Age of Onset   Kidney failure Sister    Cancer Sister    Diabetes Sister    Hypertension Sister    Hyperlipidemia Sister    Heart disease Sister    Kidney disease Sister    Cancer Brother    CAD Brother  Diabetes Brother    Hypertension Brother    Hyperlipidemia Brother    Heart disease Brother    Diabetes Other    Cancer Mother     SOCIAL HISTORY Social History   Tobacco Use   Smoking status: Never   Smokeless tobacco: Never  Vaping Use   Vaping Use: Never used  Substance Use Topics   Alcohol use: Never   Drug use: Never         OPHTHALMIC EXAM:  Base Eye Exam     Visual Acuity (ETDRS)       Right Left   Dist [place] 20/20 -1 20/70 -1   Dist ph [place]  20/60         Tonometry (Tonopen, 10:44 AM)       Right Left   Pressure 14 14         Pupils       Pupils Shape React APD   Right PERRL Round Brisk None   Left PERRL Round  Brisk None         Visual Fields (Counting fingers)       Left Right    Full Full         Extraocular Movement       Right Left    Full, Ortho Full, Ortho         Neuro/Psych     Oriented x3: Yes   Mood/Affect: Normal         Dilation     Both eyes: 1.0% Mydriacyl, 2.5% Phenylephrine @ 10:43 AM           Slit Lamp and Fundus Exam     External Exam       Right Left   External Normal Normal         Slit Lamp Exam       Right Left   Lids/Lashes Normal Normal   Conjunctiva/Sclera White and quiet White and quiet   Cornea Clear Clear   Anterior Chamber Deep and quiet Deep and quiet   Iris Round and reactive Round and reactive   Lens 2.5+ Nuclear sclerosis Centered posterior chamber intraocular lens   Anterior Vitreous Normal Normal         Fundus Exam       Right Left   Posterior Vitreous Normal Posterior vitreous detachment   Disc Normal Normal   C/D Ratio 0.5 0.5   Macula Microaneurysms, Mild clinically significant macular edema superiorly Microaneurysms, , Severe clinically significant macular edema   Vessels , NPDR-Severe NPDR severe   Periphery Cotton wool spots  Normal            IMAGING AND PROCEDURES  Imaging and Procedures for 02/11/21  OCT, Retina - OU - Both Eyes       Right Eye Quality was good. Scan locations included subfoveal. Central Foveal Thickness: 304. Progression has improved. Findings include abnormal foveal contour, vitreomacular adhesion .   Left Eye Quality was good. Scan locations included subfoveal. Central Foveal Thickness: 654. Progression has improved. Findings include vitreomacular adhesion , abnormal foveal contour.   Notes  OD  with focal CSME SUPERIOR to faz, much improved over the years compared to November, remained stable post focal laser treatment right eye, today at 5 weeks post most recent injection Avastin and some 3 months post focal laser treatment superiorly.  Much improved CSME today.   We will repeat injection today and extend interval examination right eye next    OS much worse,  post Avastin yet 5 weeks post most recent injection, with CSME worsened temporally and inferotemporally, and with subfoveal serous elevation.        Intravitreal Injection, Pharmacologic Agent - OS - Left Eye       Time Out 02/11/2021. 11:34 AM. Confirmed correct patient, procedure, site, and patient consented.   Anesthesia Topical anesthesia was used. Anesthetic medications included Lidocaine 4%.   Procedure Preparation included 10% betadine to eyelids, 5% betadine to ocular surface, Ofloxacin . A 30 gauge needle was used.   Injection: 2 mg aflibercept 2 MG/0.05ML   Route: Intravitreal, Site: Left Eye   NDC: [id number], Lot: [id number], Waste: 0 mL   Post-op Post injection exam found visual acuity of at least counting fingers. The patient tolerated the procedure well. There were no complications. The patient received written and verbal post procedure care education. Post injection medications included ocuflox.      Fluorescein Angiography Optos (Transit OS)       Injection: 500 mg Fluorescein Sodium 10 %   Route: Intravenous   NDC: [phone]   Right Eye Mid/Late phase findings include microaneurysm, window defect. Choroidal neovascularization is not present.   Left Eye   Early phase findings include microaneurysm, leakage. Mid/Late phase findings include leakage, microaneurysm. Choroidal neovascularization is not present.   Notes Very severe NPDR OU, CSME pronounced the left eye, minor perifoveal leakages right eye but with no active CSME.  May 1 day need peripheral PRP to decrease vegF burden     Color Fundus Photography Optos - OU - Both Eyes       Right Eye Progression has been stable. Macula : edema, microaneurysms.   Left Eye Progression has been stable. Macula : edema, exudates.   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  With severe retinal thickening macular region left eye CSME             ASSESSMENT/PLAN:  Severe nonproliferative diabetic retinopathy of left eye, with macular edema, associated with type 2 diabetes mellitus (HCC) OS, worsening of central involved CSME on Avastin currently at 5-week interval.  We will change to intravitreal Eylea OS today  Severe nonproliferative diabetic retinopathy of right eye, with macular edema, associated with type 2 diabetes mellitus ([place]) Much improved by OCT and also confirmed on FFA today     ICD-10-CM   1. Severe nonproliferative diabetic retinopathy of left eye, with macular edema, associated with type 2 diabetes mellitus (HCC)  X51.7001 OCT, Retina - OU - Both Eyes    Intravitreal Injection, Pharmacologic Agent - OS - Left Eye    Fluorescein Angiography Optos (Transit OS)    Color Fundus Photography Optos - OU - Both Eyes    aflibercept (EYLEA) SOLN 2 mg    Fluorescein Sodium 10 % injection 500 mg    2. Severe nonproliferative diabetic retinopathy of right eye, with macular edema, associated with type 2 diabetes mellitus ([place])  E11.3411       1.  OS with confirm resistant to antivegF-Avastin, will need to change to intravitreal Eylea OS today and follow-up next in 5 weeks  2.  Dilate OD next as scheduled  3.  Ophthalmic Meds Ordered this visit:  Meds ordered this encounter  Medications   aflibercept (EYLEA) SOLN 2 mg   Fluorescein Sodium 10 % injection 500 mg       Return in about 5 weeks (around 03/18/2021) for dilate, OS, EYLEA OCT,, and follow-up OD as scheduled.  There are no Patient Instructions on file for this visit.   Explained the diagnoses, plan, and follow up with the patient and they expressed understanding.  Patient expressed understanding of the importance of proper follow up care.   Clent Demark Velencia Lenart M.D. Diseases & Surgery of the Retina and Vitreous Retina & Diabetic [place] 02/11/21     Abbreviations: M myopia (nearsighted); A astigmatism; H hyperopia (farsighted); P presbyopia; Mrx spectacle prescription;  CTL contact lenses; OD right eye; OS left eye; OU both eyes  XT exotropia; ET esotropia; PEK punctate epithelial keratitis; PEE punctate epithelial erosions; DES dry eye syndrome; MGD meibomian gland dysfunction; ATs artificial tears; PFAT's preservative free artificial tears; [place] nuclear sclerotic cataract; PSC posterior subcapsular cataract; ERM epi-retinal membrane; PVD posterior vitreous detachment; RD retinal detachment; DM diabetes mellitus; DR diabetic retinopathy; NPDR non-proliferative diabetic retinopathy; PDR proliferative diabetic retinopathy; CSME clinically significant macular edema; DME diabetic macular edema; dbh dot blot hemorrhages; CWS cotton wool spot; POAG primary open angle glaucoma; C/D cup-to-disc ratio; HVF humphrey visual field; GVF goldmann visual field; OCT optical coherence tomography; IOP intraocular pressure; BRVO Branch retinal vein occlusion; CRVO central retinal vein occlusion; CRAO central retinal artery occlusion; BRAO branch retinal artery occlusion; RT retinal tear; SB scleral buckle; PPV pars plana vitrectomy; VH Vitreous hemorrhage; PRP panretinal laser photocoagulation; IVK intravitreal kenalog; VMT vitreomacular traction; MH Macular hole;  NVD neovascularization of the disc; NVE neovascularization elsewhere; AREDS age related eye disease study; ARMD age related macular degeneration; POAG primary open angle glaucoma; EBMD epithelial/anterior basement membrane dystrophy; ACIOL anterior chamber intraocular lens; IOL intraocular lens; PCIOL posterior chamber intraocular lens; Phaco/IOL phacoemulsification with intraocular lens placement; [place] photorefractive keratectomy; LASIK laser assisted in situ keratomileusis; HTN hypertension; DM diabetes mellitus; COPD chronic obstructive pulmonary disease

## 2021-02-12 ENCOUNTER — Ambulatory Visit (INDEPENDENT_AMBULATORY_CARE_PROVIDER_SITE_OTHER): Payer: Medicare Other | Admitting: Nurse Practitioner

## 2021-02-12 ENCOUNTER — Other Ambulatory Visit: Payer: Self-pay

## 2021-02-12 ENCOUNTER — Encounter: Payer: Self-pay | Admitting: Nurse Practitioner

## 2021-02-12 VITALS — BP 132/80 | HR 98 | Temp 98.0°F | Resp 16 | Ht 65.0 in | Wt 175.0 lb

## 2021-02-12 DIAGNOSIS — R809 Proteinuria, unspecified: Secondary | ICD-10-CM

## 2021-02-12 DIAGNOSIS — Z23 Encounter for immunization: Secondary | ICD-10-CM | POA: Diagnosis not present

## 2021-02-12 DIAGNOSIS — E1169 Type 2 diabetes mellitus with other specified complication: Secondary | ICD-10-CM

## 2021-02-12 DIAGNOSIS — R0683 Snoring: Secondary | ICD-10-CM

## 2021-02-12 DIAGNOSIS — K219 Gastro-esophageal reflux disease without esophagitis: Secondary | ICD-10-CM | POA: Diagnosis not present

## 2021-02-12 DIAGNOSIS — Z79899 Other long term (current) drug therapy: Secondary | ICD-10-CM

## 2021-02-12 DIAGNOSIS — I10 Essential (primary) hypertension: Secondary | ICD-10-CM

## 2021-02-12 DIAGNOSIS — E785 Hyperlipidemia, unspecified: Secondary | ICD-10-CM

## 2021-02-12 DIAGNOSIS — E1129 Type 2 diabetes mellitus with other diabetic kidney complication: Secondary | ICD-10-CM

## 2021-02-12 LAB — CBC WITH DIFFERENTIAL/PLATELET
Absolute Monocytes: 354 cells/uL (ref 200–950)
Basophils Absolute: 52 cells/uL (ref 0–200)
Basophils Relative: 1 %
Eosinophils Absolute: 182 cells/uL (ref 15–500)
Eosinophils Relative: 3.5 %
HCT: 28.7 % — ABNORMAL LOW (ref 38.5–50.0)
Hemoglobin: 9.5 g/dL — ABNORMAL LOW (ref 13.2–17.1)
Lymphs Abs: 1664 cells/uL (ref 850–3900)
MCH: 30.7 pg (ref 27.0–33.0)
MCHC: 33.1 g/dL (ref 32.0–36.0)
MCV: 92.9 fL (ref 80.0–100.0)
MPV: 10.2 fL (ref 7.5–12.5)
Monocytes Relative: 6.8 %
Neutro Abs: 2948 cells/uL (ref 1500–7800)
Neutrophils Relative %: 56.7 %
Platelets: 315 10*3/uL (ref 140–400)
RBC: 3.09 10*6/uL — ABNORMAL LOW (ref 4.20–5.80)
RDW: 12.4 % (ref 11.0–15.0)
Total Lymphocyte: 32 %
WBC: 5.2 10*3/uL (ref 3.8–10.8)

## 2021-02-12 LAB — LIPID PANEL
Cholesterol: 97 mg/dL (ref <200)
HDL: 32 mg/dL — ABNORMAL LOW (ref >=40)
LDL Cholesterol (Calc): 48 mg/dL (calc)
Non-HDL Cholesterol (Calc): 65 mg/dL (calc) (ref <130)
Total CHOL/HDL Ratio: 3 (calc) (ref <5.0)
Triglycerides: 90 mg/dL (ref <150)

## 2021-02-12 LAB — COMPLETE METABOLIC PANEL WITH GFR
AG Ratio: 1.3 (calc) (ref 1.0–2.5)
ALT: 10 U/L (ref 9–46)
AST: 14 U/L (ref 10–35)
Albumin: 3.8 g/dL (ref 3.6–5.1)
Alkaline phosphatase (APISO): 80 U/L (ref 35–144)
BUN/Creatinine Ratio: 12 (calc) (ref 6–22)
BUN: 21 mg/dL (ref 7–25)
CO2: 25 mmol/L (ref 20–32)
Calcium: 7.1 mg/dL — ABNORMAL LOW (ref 8.6–10.3)
Chloride: 105 mmol/L (ref 98–110)
Creat: 1.71 mg/dL — ABNORMAL HIGH (ref 0.70–1.35)
Globulin: 3 g/dL (calc) (ref 1.9–3.7)
Glucose, Bld: 109 mg/dL — ABNORMAL HIGH (ref 65–99)
Potassium: 4.3 mmol/L (ref 3.5–5.3)
Sodium: 139 mmol/L (ref 135–146)
Total Bilirubin: 0.4 mg/dL (ref 0.2–1.2)
Total Protein: 6.8 g/dL (ref 6.1–8.1)
eGFR: 43 mL/min/{1.73_m2} — ABNORMAL LOW (ref >=60)

## 2021-02-12 LAB — HEMOGLOBIN A1C
Hgb A1c MFr Bld: 5.5 % of total Hgb (ref <5.7)
Mean Plasma Glucose: 111 mg/dL
eAG (mmol/L): 6.2 mmol/L

## 2021-02-12 NOTE — Progress Notes (Signed)
BP 132/80    Pulse 98    Temp 98 F (36.7 C) (Oral)    Resp 16    Ht '5\' 5"'  (1.651 m)    Wt 175 lb (79.4 kg)    SpO2 99%    BMI 29.12 kg/m    Subjective:    Patient ID: Cameron Glover, male    DOB: 1950-10-30, 71 y.o.   MRN: [id number]  HPI: Cameron Glover is a 71 y.o. male, here alone  Chief Complaint  Patient presents with   Diabetes   Hypertension   Hyperlipidemia    6 month follow up   DM: He says his blood sugar was 98 this morning. He currently takes metformin 1000 mg BID. He denies any polyuria, polydipsia or polyphagia.  Last A1C was 6.1 on 08/11/2020.  Will get labs today. He is up to date on eye and foot exam.   HTN: Blood pressure today is 132/80.  He says he does not check his blood pressure at home. He denies any chest pain, shortness of breath, headaches or blurred vision. He is currently taking lisinopril-hydrochlorothiazide 20-12.5 mg daily.   Hyperlipidemia: His last LDL was 53 on 02/11/2020.  Will get labs today. He currently takes atorvastatin 40 mg daily. He denies any myalgia.   GERD: He currently takes pantoprazole and says his acid reflux is well controlled.   Snores: He says he snores at night and falls asleep during the day.  His ESS score was 15.  Will place referral to get sleep study done.   Relevant past medical, surgical, family and social history reviewed and updated as indicated. Interim medical history since our last visit reviewed. Allergies and medications reviewed and updated.  Review of Systems  Constitutional: Negative for fever or weight change.  Respiratory: Negative for cough and shortness of breath.   Cardiovascular: Negative for chest pain or palpitations.  Gastrointestinal: Negative for abdominal pain, no bowel changes.  Musculoskeletal: Negative for gait problem or joint swelling.  Skin: Negative for rash.  Neurological: Negative for dizziness or headache.  No other specific complaints in a complete review of systems (except as listed  in HPI above).      Objective:    BP 132/80    Pulse 98    Temp 98 F (36.7 C) (Oral)    Resp 16    Ht '5\' 5"'  (1.651 m)    Wt 175 lb (79.4 kg)    SpO2 99%    BMI 29.12 kg/m   Wt Readings from Last 3 Encounters:  02/12/21 175 lb (79.4 kg)  08/11/20 176 lb 14.4 oz (80.2 kg)  04/08/20 181 lb 14.4 oz (82.5 kg)    Physical Exam  Constitutional: Patient appears well-developed and well-nourished. Overweight No distress.  HEENT: head atraumatic, normocephalic, pupils equal and reactive to light,  neck supple Cardiovascular: Normal rate, regular rhythm and normal heart sounds.  No murmur heard. No BLE edema. Pulmonary/Chest: Effort normal and breath sounds normal. No respiratory distress. Abdominal: Soft.  There is no tenderness. Psychiatric: Patient has a normal mood and affect. behavior is normal. Judgment and thought content normal.   Results for orders placed or performed in visit on 08/11/20  COMPLETE METABOLIC PANEL WITH GFR  Result Value Ref Range   Glucose, Bld 139 (H) 65 - 99 mg/dL   BUN 19 7 - 25 mg/dL   Creat 1.56 (H) 0.70 - 1.35 mg/dL   eGFR 48 (L) > OR = 60 mL/min/1.77m   BUN/Creatinine  Ratio 12 6 - 22 (calc)   Sodium 139 135 - 146 mmol/L   Potassium 4.5 3.5 - 5.3 mmol/L   Chloride 106 98 - 110 mmol/L   CO2 24 20 - 32 mmol/L   Calcium 8.2 (L) 8.6 - 10.3 mg/dL   Total Protein 6.9 6.1 - 8.1 g/dL   Albumin 3.9 3.6 - 5.1 g/dL   Globulin 3.0 1.9 - 3.7 g/dL (calc)   AG Ratio 1.3 1.0 - 2.5 (calc)   Total Bilirubin 0.5 0.2 - 1.2 mg/dL   Alkaline phosphatase (APISO) 90 35 - 144 U/L   AST 13 10 - 35 U/L   ALT 9 9 - 46 U/L  Hemoglobin A1C  Result Value Ref Range   Hgb A1c MFr Bld 6.1 (H) <5.7 % of total Hgb   Mean Plasma Glucose 128 mg/dL   eAG (mmol/L) 7.1 mmol/L  CBC with Differential/Platelet  Result Value Ref Range   WBC 5.3 3.8 - 10.8 Thousand/uL   RBC 3.46 (L) 4.20 - 5.80 Million/uL   Hemoglobin 10.7 (L) 13.2 - 17.1 g/dL   HCT 32.1 (L) 38.5 - 50.0 %   MCV 92.8  80.0 - 100.0 fL   MCH 30.9 27.0 - 33.0 pg   MCHC 33.3 32.0 - 36.0 g/dL   RDW 12.1 11.0 - 15.0 %   Platelets 286 140 - 400 Thousand/uL   MPV 10.3 7.5 - 12.5 fL   Neutro Abs 3,169 1,500 - 7,800 cells/uL   Lymphs Abs 1,558 850 - 3,900 cells/uL   Absolute Monocytes 339 200 - 950 cells/uL   Eosinophils Absolute 191 15 - 500 cells/uL   Basophils Absolute 42 0 - 200 cells/uL   Neutrophils Relative % 59.8 %   Total Lymphocyte 29.4 %   Monocytes Relative 6.4 %   Eosinophils Relative 3.6 %   Basophils Relative 0.8 %  Microalbumin, urine  Result Value Ref Range   Microalb, Ur 262.0 mg/dL   RAM    VITAMIN D 25 Hydroxy (Vit-D Deficiency, Fractures)  Result Value Ref Range   Vit D, 25-Hydroxy 32 30 - 100 ng/mL  Vitamin B12  Result Value Ref Range   Vitamin B-12 250 200 - 1,100 pg/mL  Parathyroid hormone, intact (no Ca)  Result Value Ref Range   PTH 75 16 - 77 pg/mL      Assessment & Plan:   1. Controlled type 2 diabetes mellitus with microalbuminuria, without long-term current use of insulin ([place]) -continue current treatment plan - Hemoglobin A1c  2. Essential hypertension -continue current treatment - CBC with Differential/Platelet - COMPLETE METABOLIC PANEL WITH GFR  3. Hyperlipidemia associated with type 2 diabetes mellitus ([place]) -continue current treatment - Lipid panel - COMPLETE METABOLIC PANEL WITH GFR  4. Gastroesophageal reflux disease without esophagitis -continue current treatment  5. Snores  - Ambulatory referral to Pulmonology  6. Medication management  - Lipid panel - CBC with Differential/Platelet - COMPLETE METABOLIC PANEL WITH GFR - Hemoglobin A1c  7. Need for influenza vaccination - Flu Vaccine QUAD High Dose(Fluad)   Follow up plan: Return in about 6 months (around 08/12/2021) for follow up.

## 2021-02-13 ENCOUNTER — Other Ambulatory Visit: Payer: Self-pay | Admitting: Nurse Practitioner

## 2021-02-13 DIAGNOSIS — E1122 Type 2 diabetes mellitus with diabetic chronic kidney disease: Secondary | ICD-10-CM

## 2021-02-27 ENCOUNTER — Telehealth: Payer: Self-pay

## 2021-02-27 NOTE — Telephone Encounter (Signed)
Called and spoke to patient about upcoming appt, pt never had a sleep study  Nothing further needed.

## 2021-03-03 ENCOUNTER — Institutional Professional Consult (permissible substitution): Payer: Medicare Other | Admitting: Primary Care

## 2021-03-18 ENCOUNTER — Other Ambulatory Visit: Payer: Self-pay

## 2021-03-18 ENCOUNTER — Ambulatory Visit (INDEPENDENT_AMBULATORY_CARE_PROVIDER_SITE_OTHER): Payer: Medicare Other | Admitting: Ophthalmology

## 2021-03-18 ENCOUNTER — Encounter (INDEPENDENT_AMBULATORY_CARE_PROVIDER_SITE_OTHER): Payer: Self-pay | Admitting: Ophthalmology

## 2021-03-18 DIAGNOSIS — H2511 Age-related nuclear cataract, right eye: Secondary | ICD-10-CM

## 2021-03-18 DIAGNOSIS — E113411 Type 2 diabetes mellitus with severe nonproliferative diabetic retinopathy with macular edema, right eye: Secondary | ICD-10-CM | POA: Diagnosis not present

## 2021-03-18 DIAGNOSIS — H43823 Vitreomacular adhesion, bilateral: Secondary | ICD-10-CM | POA: Diagnosis not present

## 2021-03-18 DIAGNOSIS — E113412 Type 2 diabetes mellitus with severe nonproliferative diabetic retinopathy with macular edema, left eye: Secondary | ICD-10-CM | POA: Diagnosis not present

## 2021-03-18 MED ORDER — AFLIBERCEPT 2MG/0.05ML IZ SOLN FOR KALEIDOSCOPE
2.0000 mg | INTRAVITREAL | Status: AC | PRN
  Administered 2021-03-18: 2 mg via INTRAVITREAL

## 2021-03-18 NOTE — Assessment & Plan Note (Signed)
At 7 weeks post most recent Avastin OD and stable and no worsening of CSME, follow-up as scheduled ?

## 2021-03-18 NOTE — Assessment & Plan Note (Signed)
Physiologic OU 

## 2021-03-18 NOTE — Assessment & Plan Note (Signed)
Okay to proceed with cataract surgery right eye at any time as per Dr. Katy Fitch ?

## 2021-03-18 NOTE — Progress Notes (Signed)
? ? ?03/18/2021 ? ?  ? ?CHIEF COMPLAINT ?Patient presents for  ?Chief Complaint  ?Patient presents with  ? Diabetic Retinopathy with Macular Edema  ? ? ? ? ?HISTORY OF PRESENT ILLNESS: ?Cameron Glover is a 71 y.o. male who presents to the clinic today for:  ? ?HPI   ?5 weeks for dilate, OS EYLEA OCT. ?Pt states no changes in vision.  ?Pt denies floaters and FOL. ? ?Last edited by Silvestre Moment on 03/18/2021 10:03 AM.  ?  ? ? ?Referring physician: ?Clent Jacks, MD ?[place] ?[address] ?[place],  [address] [address] ? ?HISTORICAL INFORMATION:  ? ?Selected notes from the [facility] ?  ? ?Lab Results  ?Component Value Date  ? HGBA1C 5.5 02/12/2021  ?  ? ?CURRENT MEDICATIONS: ?Current Outpatient Medications (Ophthalmic Drugs)  ?Medication Sig  ? Prednisol Ace-Moxiflox-Bromfen 1-0.5-0.075 % SUSP Place 1 drop into the left eye 4 (four) times daily.  ? ?No current facility-administered medications for this visit. (Ophthalmic Drugs)  ? ?Current Outpatient Medications (Other)  ?Medication Sig  ? Accu-Chek Softclix Lancets lancets Use as instructed to check blood sugar (Patient not taking: Reported on 02/12/2021)  ? amLODipine (NORVASC) 10 MG tablet TAKE 1 TABLET BY MOUTH EVERY DAY  ? atorvastatin (LIPITOR) 40 MG tablet TAKE 1 TABLET BY MOUTH EVERY DAY  ? Blood Glucose Monitoring Suppl (ACCU-CHEK GUIDE ME) w/Device KIT by Does not apply route. Sample glucometer provided in office (Patient not taking: Reported on 02/12/2021)  ? glucose blood (ACCU-CHEK GUIDE) test strip Use as instructed to check blood sugar (Patient not taking: Reported on 02/12/2021)  ? lisinopril-hydrochlorothiazide (ZESTORETIC) 20-12.5 MG tablet TAKE 1 TABLET BY MOUTH EVERY DAY  ? metFORMIN (GLUCOPHAGE) 1000 MG tablet TAKE 1 TABLET BY MOUTH TWICE A DAY WITH A MEAL  ? pantoprazole (PROTONIX) 40 MG tablet TAKE 1 TABLET BY MOUTH EVERY DAY  ? ?No current facility-administered medications for this visit. (Other)  ? ? ? ? ?REVIEW OF SYSTEMS: ?ROS   ?Negative for:  Constitutional, Gastrointestinal, Neurological, Skin, Genitourinary, Musculoskeletal, HENT, Endocrine, Cardiovascular, Eyes, Respiratory, Psychiatric, Allergic/Imm, Heme/Lymph ?Last edited by Silvestre Moment on 03/18/2021 10:03 AM.  ?  ? ? ? ?ALLERGIES ?No Known Allergies ? ?PAST MEDICAL HISTORY ?Past Medical History:  ?Diagnosis Date  ? Acute renal failure (ARF) (HCC)   ? AKI (acute kidney injury) ([place]) 09/02/2017  ? Anemia due to chronic kidney disease 04/15/2018  ? DM (diabetes mellitus) with complications ([place]) 03/11/6576  ? Elevated lipase 04/15/2018  ? Hyperglycemia without ketosis   ? Hyperlipidemia   ? Hypertension   ? Nuclear sclerotic cataract of left eye 05/14/2019  ? ?Past Surgical History:  ?Procedure Laterality Date  ? COLONOSCOPY WITH PROPOFOL N/A 01/15/2019  ? Procedure: COLONOSCOPY WITH PROPOFOL;  Surgeon: Jonathon Bellows, MD;  Location: [facility] ENDOSCOPY;  Service: Gastroenterology;  Laterality: N/A;  ? ? ?FAMILY HISTORY ?Family History  ?Problem Relation Age of Onset  ? Kidney failure Sister   ? Cancer Sister   ? Diabetes Sister   ? Hypertension Sister   ? Hyperlipidemia Sister   ? Heart disease Sister   ? Kidney disease Sister   ? Cancer Brother   ? CAD Brother   ? Diabetes Brother   ? Hypertension Brother   ? Hyperlipidemia Brother   ? Heart disease Brother   ? Diabetes Other   ? Cancer Mother   ? ? ?SOCIAL HISTORY ?Social History  ? ?Tobacco Use  ? Smoking status: Never  ? Smokeless tobacco:  Never  ?Vaping Use  ? Vaping Use: Never used  ?Substance Use Topics  ? Alcohol use: Never  ? Drug use: Never  ? ?  ? ?  ? ?OPHTHALMIC EXAM: ? ?Base Eye Exam   ? ? Visual Acuity (ETDRS)   ? ?   Right Left  ? Dist [place] 20/20 20/60 -2  ? Dist ph [place]  20/50  ? ?  ?  ? ? Tonometry (Tonopen, 10:09 AM)   ? ?   Right Left  ? Pressure 10 11  ? ?  ?  ? ? Pupils   ? ?   Pupils APD  ? Right PERRL None  ? Left PERRL None  ? ?  ?  ? ? Visual Fields   ? ?   Left Right  ?  Full Full  ? ?  ?  ? ? Extraocular Movement   ? ?   Right Left  ?  Full  Full  ? ?  ?  ? ? Neuro/Psych   ? ? Oriented x3: Yes  ? Mood/Affect: Normal  ? ?  ?  ? ? Dilation   ? ? Left eye: 1.0% Mydriacyl, 2.5% Phenylephrine @ 10:09 AM  ? ?  ?  ? ?  ? ?Slit Lamp and Fundus Exam   ? ? External Exam   ? ?   Right Left  ? External Normal Normal  ? ?  ?  ? ? Slit Lamp Exam   ? ?   Right Left  ? Lids/Lashes Normal Normal  ? Conjunctiva/Sclera White and quiet White and quiet  ? Cornea Clear Clear  ? Anterior Chamber Deep and quiet Deep and quiet  ? Iris Round and reactive Round and reactive  ? Lens 2.5+ Nuclear sclerosis Centered posterior chamber intraocular lens  ? Anterior Vitreous Normal Normal  ? ?  ?  ? ? Fundus Exam   ? ?   Right Left  ? Posterior Vitreous  Posterior vitreous detachment  ? Disc  Normal  ? C/D Ratio  0.5  ? Macula  Microaneurysms, , no clinically significant macular edema detectable clinically  ? Vessels  NPDR severe  ? Periphery  Normal  ? ?  ?  ? ?  ? ? ?IMAGING AND PROCEDURES  ?Imaging and Procedures for 03/18/21 ? ?OCT, Retina - OU - Both Eyes   ? ?   ?Right Eye ?Quality was good. Scan locations included subfoveal. Central Foveal Thickness: 304. Progression has improved. Findings include abnormal foveal contour, vitreomacular adhesion .  ? ?Left Eye ?Quality was good. Scan locations included subfoveal. Central Foveal Thickness: 654. Progression has improved. Findings include vitreomacular adhesion , abnormal foveal contour.  ? ?Notes ? ?OD  with focal CSME SUPERIOR to faz, much improved over the years compared to November, remained stable post focal laser treatment right eye, today at 5 weeks post most recent injection Avastin and some 3 months post focal laser treatment superiorly.  Follow-up as scheduled OD ? ?Much improved CSME today OS. ? ?OS vastly improved OS, post change to Eylea, repeat Eylea today at 5 weeks ? ? ? ?  ? ?Intravitreal Injection, Pharmacologic Agent - OS - Left Eye   ? ?   ?Time Out ?03/18/2021. 10:58 AM. Confirmed correct patient, procedure, site,  and patient consented.  ? ?Anesthesia ?Topical anesthesia was used. Anesthetic medications included Lidocaine 4%.  ? ?Procedure ?Preparation included 10% betadine to eyelids, 5% betadine to ocular surface, Ofloxacin . A 30 gauge needle  was used.  ? ?Injection: ?2 mg aflibercept 2 MG/0.05ML ?  Route: Intravitreal, Site: Left Eye ?  [place]: [id number], Lot: [id number], Waste: 0 mL  ? ?Post-op ?Post injection exam found visual acuity of at least counting fingers. The patient tolerated the procedure well. There were no complications. The patient received written and verbal post procedure care education. Post injection medications included ocuflox.  ? ?  ? ? ?  ?  ? ?  ?ASSESSMENT/PLAN: ? ?Vitreomacular adhesion of both eyes ?Physiologic OU ? ?Severe nonproliferative diabetic retinopathy of right eye, with macular edema, associated with type 2 diabetes mellitus ([place]) ?At 7 weeks post most recent Avastin OD and stable and no worsening of CSME, follow-up as scheduled ? ?Severe nonproliferative diabetic retinopathy of left eye, with macular edema, associated with type 2 diabetes mellitus ([place]) ?OS vastly improved post Eylea injection number 04 February 2021, center involved CSME and serous retinal detachment was 654 ?m is diminished now to 316 ?m we will repeat Eylea today in 5 weeks and follow-up next in 6 weeks ? ?Nuclear sclerotic cataract of right eye ?Okay to proceed with cataract surgery right eye at any time as per Dr. Katy Fitch  ? ?  ICD-10-CM   ?1. Severe nonproliferative diabetic retinopathy of left eye, with macular edema, associated with type 2 diabetes mellitus ([place])  V13.6859 OCT, Retina - OU - Both Eyes  ?  Intravitreal Injection, Pharmacologic Agent - OS - Left Eye  ?  aflibercept (EYLEA) SOLN 2 mg  ?  ?2. Vitreomacular adhesion of both eyes  H43.823   ?  ?3. Severe nonproliferative diabetic retinopathy of right eye, with macular edema, associated with type 2 diabetes mellitus ([place])  V23.4144   ?  ?4. Nuclear  sclerotic cataract of right eye  H25.11   ?  ? ? ?1.  OS with center involved CSME resistant to Avastin, vastly improved post injection Eylea No. 1 5 weeks previous.  Repeat injection today ? ?2.  OD stable and improvi

## 2021-03-18 NOTE — Assessment & Plan Note (Signed)
OS vastly improved post Eylea injection number 04 February 2021, center involved CSME and serous retinal detachment was 654 ?m is diminished now to 316 ?m we will repeat Eylea today in 5 weeks and follow-up next in 6 weeks ?

## 2021-03-24 ENCOUNTER — Institutional Professional Consult (permissible substitution): Payer: Medicare Other | Admitting: Primary Care

## 2021-03-24 NOTE — Progress Notes (Deleted)
? ?'@Patient'  ID: Cameron Glover, male    DOB: 12/05/1950, 71 y.o.   MRN: [id number] ? ?No chief complaint on file. ? ? ?Referring provider: ?[name], PA-C ? ?HPI: ?71 year old male, never smoked.  Past medical history significant for hypertension, LVH, GERD, chronic kidney disease stage II, type 2 diabetes, hyperparathyroidism. ? ?03/24/2021 ?Patient presents today for sleep consult. ? ? ? ? ?Sleep questionnaire ?Symptoms-   ?Prior sleep study-   ?Bedtime- 10:30pm- ?Time to fall asleep-   ?Nocturnal awakenings-  ?Out of bed/start of day-  ?Weight changes-  ?Do you operate heavy machinery-  ?Do you currently wear CPAP- ?Do you current wear oxygen-  ?[place]- ? ? ?No Known Allergies ? ?Immunization History  ?Administered Date(s) Administered  ? Fluad Quad(high Dose 65+) 02/12/2021  ? Influenza,inj,Quad PF,6+ Mos 10/04/2017  ? Influenza-Unspecified 10/04/2018  ? PFIZER(Purple Top)SARS-COV-2 Vaccination 03/04/2019, 03/28/2019, 01/15/2020  ? PNEUMOCOCCAL CONJUGATE-20 08/11/2020  ? Pneumococcal Conjugate-13 07/23/2019  ? Pneumococcal Polysaccharide-23 09/03/2017  ? ? ?Past Medical History:  ?Diagnosis Date  ? Acute renal failure (ARF) (HCC)   ? AKI (acute kidney injury) ([place]) 09/02/2017  ? Anemia due to chronic kidney disease 04/15/2018  ? DM (diabetes mellitus) with complications ([place]) 9/92/4268  ? Elevated lipase 04/15/2018  ? Hyperglycemia without ketosis   ? Hyperlipidemia   ? Hypertension   ? Nuclear sclerotic cataract of left eye 05/14/2019  ? ? ?Tobacco History: ?Social History  ? ?Tobacco Use  ?Smoking Status Never  ?Smokeless Tobacco Never  ? ?Counseling given: Not Answered ? ? ?Outpatient Medications Prior to Visit  ?Medication Sig Dispense Refill  ? Accu-Chek Softclix Lancets lancets Use as instructed to check blood sugar (Patient not taking: Reported on 02/12/2021) 100 each 12  ? amLODipine (NORVASC) 10 MG tablet TAKE 1 TABLET BY MOUTH EVERY DAY 90 tablet 0  ? atorvastatin (LIPITOR) 40 MG tablet TAKE 1 TABLET BY  MOUTH EVERY DAY 90 tablet 3  ? Blood Glucose Monitoring Suppl (ACCU-CHEK GUIDE ME) w/Device KIT by Does not apply route. Sample glucometer provided in office (Patient not taking: Reported on 02/12/2021)    ? glucose blood (ACCU-CHEK GUIDE) test strip Use as instructed to check blood sugar (Patient not taking: Reported on 02/12/2021) 100 each 12  ? lisinopril-hydrochlorothiazide (ZESTORETIC) 20-12.5 MG tablet TAKE 1 TABLET BY MOUTH EVERY DAY 90 tablet 0  ? metFORMIN (GLUCOPHAGE) 1000 MG tablet TAKE 1 TABLET BY MOUTH TWICE A DAY WITH A MEAL 180 tablet 2  ? pantoprazole (PROTONIX) 40 MG tablet TAKE 1 TABLET BY MOUTH EVERY DAY 90 tablet 3  ? Prednisol Ace-Moxiflox-Bromfen 1-0.5-0.075 % SUSP Place 1 drop into the left eye 4 (four) times daily.    ? ?No facility-administered medications prior to visit.  ? ? ? ? ?Review of Systems ? ?Review of Systems ? ? ?Physical Exam ? ?There were no vitals taken for this visit. ?Physical Exam  ? ?Lab Results: ? ?CBC ?   ?Component Value Date/Time  ? WBC 5.2 02/12/2021 1129  ? RBC 3.09 (L) 02/12/2021 1129  ? HGB 9.5 (L) 02/12/2021 1129  ? HCT 28.7 (L) 02/12/2021 1129  ? PLT 315 02/12/2021 1129  ? MCV 92.9 02/12/2021 1129  ? MCH 30.7 02/12/2021 1129  ? MCHC 33.1 02/12/2021 1129  ? RDW 12.4 02/12/2021 1129  ? LYMPHSABS 1,664 02/12/2021 1129  ? MONOABS 0.5 09/02/2017 1421  ? EOSABS 182 02/12/2021 1129  ? BASOSABS 52 02/12/2021 1129  ? ? ?BMET ?   ?Component Value Date/Time  ? NA  139 02/12/2021 1129  ? K 4.3 02/12/2021 1129  ? CL 105 02/12/2021 1129  ? CO2 25 02/12/2021 1129  ? GLUCOSE 109 (H) 02/12/2021 1129  ? BUN 21 02/12/2021 1129  ? CREATININE 1.71 (H) 02/12/2021 1129  ? CALCIUM 7.1 (L) 02/12/2021 1129  ? GFRNONAA 50 (L) 02/11/2020 1139  ? GFRAA 58 (L) 02/11/2020 1139  ? ? ?BNP ?   ?Component Value Date/Time  ? BNP 18 02/12/2019 1412  ? ? ?ProBNP ?No results found for: PROBNP ? ?Imaging: ?Intravitreal Injection, Pharmacologic Agent - OS - Left Eye ? ?Result Date: 03/18/2021 ?Time Out  03/18/2021. 10:58 AM. Confirmed correct patient, procedure, site, and patient consented. Anesthesia Topical anesthesia was used. Anesthetic medications included Lidocaine 4%. Procedure Preparation included 10% betadine to eyelids, 5% betadine to ocular surface, Ofloxacin . A 30 gauge needle was used. Injection: 2 mg aflibercept 2 MG/0.05ML   Route: Intravitreal, Site: Left Eye   [place]: [id number], Lot: [id number], Waste: 0 mL Post-op Post injection exam found visual acuity of at least counting fingers. The patient tolerated the procedure well. There were no complications. The patient received written and verbal post procedure care education. Post injection medications included ocuflox.  ? ?OCT, Retina - OU - Both Eyes ? ?Result Date: 03/18/2021 ?Right Eye Quality was good. Scan locations included subfoveal. Central Foveal Thickness: 304. Progression has improved. Findings include abnormal foveal contour, vitreomacular adhesion . Left Eye Quality was good. Scan locations included subfoveal. Central Foveal Thickness: 654. Progression has improved. Findings include vitreomacular adhesion , abnormal foveal contour. Notes OD  with focal CSME SUPERIOR to faz, much improved over the years compared to November, remained stable post focal laser treatment right eye, today at 5 weeks post most recent injection Avastin and some 3 months post focal laser treatment superiorly.  Follow-up as scheduled OD Much improved CSME today OS. OS vastly improved OS, post change to Eylea, repeat Eylea today at 5 weeks  ? ? ?Assessment & Plan:  ? ?No problem-specific Assessment & Plan notes found for this encounter. ? ? ? ? ?Martyn Ehrich, NP ?03/24/2021 ? ?

## 2021-03-29 ENCOUNTER — Other Ambulatory Visit: Payer: Self-pay | Admitting: Family Medicine

## 2021-03-29 DIAGNOSIS — I160 Hypertensive urgency: Secondary | ICD-10-CM

## 2021-03-30 DIAGNOSIS — E119 Type 2 diabetes mellitus without complications: Secondary | ICD-10-CM | POA: Insufficient documentation

## 2021-03-30 DIAGNOSIS — E1129 Type 2 diabetes mellitus with other diabetic kidney complication: Secondary | ICD-10-CM | POA: Insufficient documentation

## 2021-03-30 DIAGNOSIS — E1165 Type 2 diabetes mellitus with hyperglycemia: Secondary | ICD-10-CM | POA: Insufficient documentation

## 2021-03-30 NOTE — Telephone Encounter (Signed)
Pt needs appt 30 day given of bp med ?

## 2021-03-30 NOTE — Telephone Encounter (Signed)
Spoke with pt and he did schedule appt with [facility] for April

## 2021-04-05 ENCOUNTER — Other Ambulatory Visit: Payer: Self-pay | Admitting: Family Medicine

## 2021-04-05 DIAGNOSIS — I160 Hypertensive urgency: Secondary | ICD-10-CM

## 2021-04-06 ENCOUNTER — Other Ambulatory Visit: Payer: Self-pay

## 2021-04-09 ENCOUNTER — Ambulatory Visit: Payer: Medicare Other

## 2021-04-15 ENCOUNTER — Telehealth: Payer: Self-pay | Admitting: Family Medicine

## 2021-04-15 NOTE — Telephone Encounter (Signed)
Copied from [facility] [id number]. Topic: Medicare AWV ?>> Apr 15, 2021 11:42 AM Cher Nakai R wrote: ?Reason for CRM:  ?No answer unable to leave a message for patient to call back and schedule Medicare Annual Wellness Visit (AWV) in office.  ? ?If unable to come into the office for AWV,  please offer to do virtually or by telephone. ? ?Last AWV:  04/08/2020 ? ?Please schedule at anytime with [place]. ? ?30 minute appointment for Virtual or phone ?45 minute appointment for in office or Initial virtual/phone ? ?Any questions, please contact me at [phone] ?

## 2021-04-16 ENCOUNTER — Other Ambulatory Visit: Payer: Self-pay | Admitting: Family Medicine

## 2021-04-16 ENCOUNTER — Ambulatory Visit (INDEPENDENT_AMBULATORY_CARE_PROVIDER_SITE_OTHER): Payer: Medicare Other | Admitting: Family Medicine

## 2021-04-16 DIAGNOSIS — E1169 Type 2 diabetes mellitus with other specified complication: Secondary | ICD-10-CM

## 2021-04-16 DIAGNOSIS — I160 Hypertensive urgency: Secondary | ICD-10-CM

## 2021-04-16 DIAGNOSIS — N183 Chronic kidney disease, stage 3 unspecified: Secondary | ICD-10-CM

## 2021-04-16 DIAGNOSIS — E119 Type 2 diabetes mellitus without complications: Secondary | ICD-10-CM

## 2021-04-16 DIAGNOSIS — I10 Essential (primary) hypertension: Secondary | ICD-10-CM

## 2021-04-16 DIAGNOSIS — E785 Hyperlipidemia, unspecified: Secondary | ICD-10-CM

## 2021-04-16 DIAGNOSIS — R809 Proteinuria, unspecified: Secondary | ICD-10-CM

## 2021-04-16 DIAGNOSIS — E1129 Type 2 diabetes mellitus with other diabetic kidney complication: Secondary | ICD-10-CM

## 2021-04-16 DIAGNOSIS — E1122 Type 2 diabetes mellitus with diabetic chronic kidney disease: Secondary | ICD-10-CM

## 2021-04-16 MED ORDER — METFORMIN HCL 1000 MG PO TABS: ORAL_TABLET | ORAL | 1 refills | Status: DC

## 2021-04-16 MED ORDER — LISINOPRIL-HYDROCHLOROTHIAZIDE 20-12.5 MG PO TABS: 1.0000 | ORAL_TABLET | Freq: Every day | ORAL | 1 refills | Status: DC

## 2021-04-16 MED ORDER — AMLODIPINE BESYLATE 10 MG PO TABS
10.0000 mg | ORAL_TABLET | Freq: Every day | ORAL | 1 refills | Status: DC
Start: 2021-04-16 — End: 2021-08-17

## 2021-04-16 NOTE — Progress Notes (Signed)
Appt made in error

## 2021-04-16 NOTE — Progress Notes (Signed)
Appt made in error ? ?Pt told he needed appt, however routine f/up appt was just done with [facility], pt only needed his meds refilled ? ?He has his next needed appt scheduled for aug ? ?[name], PA-C ? ?

## 2021-04-29 ENCOUNTER — Encounter (INDEPENDENT_AMBULATORY_CARE_PROVIDER_SITE_OTHER): Payer: Self-pay | Admitting: Ophthalmology

## 2021-04-29 ENCOUNTER — Ambulatory Visit (INDEPENDENT_AMBULATORY_CARE_PROVIDER_SITE_OTHER): Payer: Medicare Other | Admitting: Ophthalmology

## 2021-04-29 ENCOUNTER — Encounter: Payer: Self-pay | Admitting: Family Medicine

## 2021-04-29 DIAGNOSIS — E113411 Type 2 diabetes mellitus with severe nonproliferative diabetic retinopathy with macular edema, right eye: Secondary | ICD-10-CM | POA: Diagnosis not present

## 2021-04-29 DIAGNOSIS — H35033 Hypertensive retinopathy, bilateral: Secondary | ICD-10-CM

## 2021-04-29 DIAGNOSIS — R0683 Snoring: Secondary | ICD-10-CM

## 2021-04-29 DIAGNOSIS — E113412 Type 2 diabetes mellitus with severe nonproliferative diabetic retinopathy with macular edema, left eye: Secondary | ICD-10-CM | POA: Diagnosis not present

## 2021-04-29 LAB — HM DIABETES EYE EXAM

## 2021-04-29 MED ORDER — AFLIBERCEPT 2MG/0.05ML IZ SOLN FOR KALEIDOSCOPE
2.0000 mg | INTRAVITREAL | Status: AC | PRN
  Administered 2021-04-29: 2 mg via INTRAVITREAL

## 2021-04-29 NOTE — Assessment & Plan Note (Signed)
OD now controlled CSME, 5 months post most recent injection into vegF we will continue to monitor particularly severe NPDR with risk of progression of PDR with superimposed hypertensive retinopathy ?

## 2021-04-29 NOTE — Assessment & Plan Note (Signed)
Patient encouraged to continue to monitor blood pressure so as to prevent superimposed hypertensive retinopathy upon severe diabetic retinopathy which pose a risk for progression of PDR ?

## 2021-04-29 NOTE — Assessment & Plan Note (Signed)
Patient initially had a sleep study arranged but this was canceled or postponed by the testing agency. ? ?I encourage patient to follow-up again to be Reevaluated because episodic hypertension and severe hypoxia at night untreated or undiagnosed sleep apnea can make his retinopathy worse or more difficult to treat ?

## 2021-04-29 NOTE — Progress Notes (Signed)
? ? ?04/29/2021 ? ?  ? ?CHIEF COMPLAINT ?Patient presents for  ?Chief Complaint  ?Patient presents with  ? Diabetic Retinopathy with Macular Edema  ? ? ? ? ?HISTORY OF PRESENT ILLNESS: ?Cameron Glover is a 71 y.o. male who presents to the clinic today for:  ? ?HPI   ?6 weeks dilate OS, Eylea OS, OCT. ?Patient states vision is stable and unchanged since last visit. Denies any new floaters or FOL. ?Patient reports his LBS was checked yesterday and he thinks it was 120. ?Last edited by Laurin Coder on 04/29/2021 10:26 AM.  ?  ? ? ?Referring physician: ?[name], PA-C ?[address] ?[address] ?[place],  [place] [address] ? ?HISTORICAL INFORMATION:  ? ?Selected notes from the [facility] ?  ? ?Lab Results  ?Component Value Date  ? HGBA1C 5.5 02/12/2021  ?  ? ?CURRENT MEDICATIONS: ?Current Outpatient Medications (Ophthalmic Drugs)  ?Medication Sig  ? Prednisol Ace-Moxiflox-Bromfen 1-0.5-0.075 % SUSP Place 1 drop into the left eye 4 (four) times daily.  ? ?No current facility-administered medications for this visit. (Ophthalmic Drugs)  ? ?Current Outpatient Medications (Other)  ?Medication Sig  ? Accu-Chek Softclix Lancets lancets Use as instructed to check blood sugar (Patient not taking: Reported on 02/12/2021)  ? amLODipine (NORVASC) 10 MG tablet Take 1 tablet (10 mg total) by mouth daily.  ? atorvastatin (LIPITOR) 40 MG tablet TAKE 1 TABLET BY MOUTH EVERY DAY  ? Blood Glucose Monitoring Suppl (ACCU-CHEK GUIDE ME) w/Device KIT by Does not apply route. Sample glucometer provided in office (Patient not taking: Reported on 02/12/2021)  ? glucose blood (ACCU-CHEK GUIDE) test strip Use as instructed to check blood sugar (Patient not taking: Reported on 02/12/2021)  ? lisinopril-hydrochlorothiazide (ZESTORETIC) 20-12.5 MG tablet Take 1 tablet by mouth daily.  ? metFORMIN (GLUCOPHAGE) 1000 MG tablet TAKE 1 TABLET BY MOUTH TWICE A DAY WITH A MEAL  ? pantoprazole (PROTONIX) 40 MG tablet TAKE 1 TABLET BY MOUTH EVERY DAY   ? ?No current facility-administered medications for this visit. (Other)  ? ? ? ? ?REVIEW OF SYSTEMS: ?ROS   ?Negative for: Constitutional, Gastrointestinal, Neurological, Skin, Genitourinary, Musculoskeletal, HENT, Endocrine, Cardiovascular, Eyes, Respiratory, Psychiatric, Allergic/Imm, Heme/Lymph ?Last edited by Hurman Horn, MD on 04/29/2021 10:47 AM.  ?  ? ? ? ?ALLERGIES ?No Known Allergies ? ?PAST MEDICAL HISTORY ?Past Medical History:  ?Diagnosis Date  ? Acute renal failure (ARF) (HCC)   ? AKI (acute kidney injury) ([facility]) 09/02/2017  ? Anemia due to chronic kidney disease 04/15/2018  ? DM (diabetes mellitus) with complications ([facility]) 3/66/4403  ? Elevated lipase 04/15/2018  ? Hyperglycemia without ketosis   ? Hyperlipidemia   ? Hypertension   ? Nuclear sclerotic cataract of left eye 05/14/2019  ? ?Past Surgical History:  ?Procedure Laterality Date  ? COLONOSCOPY WITH PROPOFOL N/A 01/15/2019  ? Procedure: COLONOSCOPY WITH PROPOFOL;  Surgeon: Jonathon Bellows, MD;  Location: [facility] ENDOSCOPY;  Service: Gastroenterology;  Laterality: N/A;  ? ? ?FAMILY HISTORY ?Family History  ?Problem Relation Age of Onset  ? Kidney failure Sister   ? Cancer Sister   ? Diabetes Sister   ? Hypertension Sister   ? Hyperlipidemia Sister   ? Heart disease Sister   ? Kidney disease Sister   ? Cancer Brother   ? CAD Brother   ? Diabetes Brother   ? Hypertension Brother   ? Hyperlipidemia Brother   ? Heart disease Brother   ? Diabetes Other   ? Cancer Mother   ? ? ?  SOCIAL HISTORY ?Social History  ? ?Tobacco Use  ? Smoking status: Never  ? Smokeless tobacco: Never  ?Vaping Use  ? Vaping Use: Never used  ?Substance Use Topics  ? Alcohol use: Never  ? Drug use: Never  ? ?  ? ?  ? ?OPHTHALMIC EXAM: ? ?Base Eye Exam   ? ? Visual Acuity (ETDRS)   ? ?   Right Left  ? Dist [place] 20/20 20/50 -1  ? Dist ph [place]  20/25  ? ?  ?  ? ? Tonometry (Tonopen, 10:28 AM)   ? ?   Right Left  ? Pressure 14 9  ? ?  ?  ? ? Pupils   ? ?   Pupils Dark Light React APD  ? Right  PERRL 4 3 Brisk None  ? Left PERRL 4 3 Brisk None  ? ?  ?  ? ? Extraocular Movement   ? ?   Right Left  ?  Full Full  ? ?  ?  ? ? Neuro/Psych   ? ? Oriented x3: Yes  ? Mood/Affect: Normal  ? ?  ?  ? ? Dilation   ? ? Left eye: 1.0% Mydriacyl, 2.5% Phenylephrine @ 10:28 AM  ? ?  ?  ? ?  ? ?Slit Lamp and Fundus Exam   ? ? External Exam   ? ?   Right Left  ? External Normal Normal  ? ?  ?  ? ? Slit Lamp Exam   ? ?   Right Left  ? Lids/Lashes Normal Normal  ? Conjunctiva/Sclera White and quiet White and quiet  ? Cornea Clear Clear  ? Anterior Chamber Deep and quiet Deep and quiet  ? Iris Round and reactive Round and reactive  ? Lens 2.5+ Nuclear sclerosis Centered posterior chamber intraocular lens  ? Anterior Vitreous Normal Normal  ? ?  ?  ? ? Fundus Exam   ? ?   Right Left  ? Posterior Vitreous  Posterior vitreous detachment  ? Disc  Normal  ? C/D Ratio  0.5  ? Macula  Microaneurysms, , no clinically significant macular edema detectable clinically  ? Vessels  NPDR severe  ? Periphery  Normal  ? ?  ?  ? ?  ? ? ?IMAGING AND PROCEDURES  ?Imaging and Procedures for 04/29/21 ? ?OCT, Retina - OU - Both Eyes   ? ?   ?Right Eye ?Quality was good. Scan locations included subfoveal. Central Foveal Thickness: 291. Progression has improved. Findings include abnormal foveal contour, vitreomacular adhesion .  ? ?Left Eye ?Quality was good. Scan locations included subfoveal. Central Foveal Thickness: 385. Progression has improved. Findings include vitreomacular adhesion , abnormal foveal contour.  ? ?Notes ? ?OD  with focal CSME SUPERIOR to faz, much improved over the years compared to November, remained stable post focal laser treatment right eye, today at 3 months post most recent injection Avastin and some  5 months post focal laser treatment superiorly.  Follow-up as scheduled OD, no treatment needed today ? ?OS now stabilized and active and center involved CSME ? ?OS vastly improved OS, now on Eylea still active.  Repeat  injection today to control ? ? ?  ? ?Intravitreal Injection, Pharmacologic Agent - OS - Left Eye   ? ?   ?Time Out ?04/29/2021. 10:52 AM. Confirmed correct patient, procedure, site, and patient consented.  ? ?Anesthesia ?Topical anesthesia was used. Anesthetic medications included Lidocaine 4%.  ? ?Procedure ?Preparation included 10% betadine to  eyelids, 5% betadine to ocular surface, Ofloxacin . A 30 gauge needle was used.  ? ?Injection: ?2 mg aflibercept 2 MG/0.05ML ?  Route: Intravitreal, Site: Left Eye ?  [place]: [id number], Lot: [id number], Waste: 0 mL  ? ?Post-op ?Post injection exam found visual acuity of at least counting fingers. The patient tolerated the procedure well. There were no complications. The patient received written and verbal post procedure care education. Post injection medications included ocuflox.  ? ?  ? ? ?  ?  ? ?  ?ASSESSMENT/PLAN: ? ?Severe nonproliferative diabetic retinopathy of right eye, with macular edema, associated with type 2 diabetes mellitus ([place]) ?OD now controlled CSME, 5 months post most recent injection into vegF we will continue to monitor particularly severe NPDR with risk of progression of PDR with superimposed hypertensive retinopathy ? ?Grade 3 hypertensive retinopathy, bilateral ?Patient encouraged to continue to monitor blood pressure so as to prevent superimposed hypertensive retinopathy upon severe diabetic retinopathy which pose a risk for progression of PDR ? ?Snores ?Patient initially had a sleep study arranged but this was canceled or postponed by the testing agency. ? ?I encourage patient to follow-up again to be Reevaluated because episodic hypertension and severe hypoxia at night untreated or undiagnosed sleep apnea can make his retinopathy worse or more difficult to treat  ? ?  ICD-10-CM   ?1. Severe nonproliferative diabetic retinopathy of left eye, with macular edema, associated with type 2 diabetes mellitus ([place])  G94.8347 OCT, Retina - OU - Both Eyes  ?   Intravitreal Injection, Pharmacologic Agent - OS - Left Eye  ?  aflibercept (EYLEA) SOLN 2 mg  ?  ?2. Severe nonproliferative diabetic retinopathy of right eye, with macular edema, associated with type 2 diabetes

## 2021-05-25 ENCOUNTER — Other Ambulatory Visit: Payer: Self-pay | Admitting: Family Medicine

## 2021-05-25 DIAGNOSIS — R1013 Epigastric pain: Secondary | ICD-10-CM

## 2021-05-25 DIAGNOSIS — E119 Type 2 diabetes mellitus without complications: Secondary | ICD-10-CM

## 2021-05-26 ENCOUNTER — Ambulatory Visit (INDEPENDENT_AMBULATORY_CARE_PROVIDER_SITE_OTHER): Payer: Medicare Other

## 2021-05-26 DIAGNOSIS — Z125 Encounter for screening for malignant neoplasm of prostate: Secondary | ICD-10-CM | POA: Diagnosis not present

## 2021-05-26 DIAGNOSIS — Z0000 Encounter for general adult medical examination without abnormal findings: Secondary | ICD-10-CM | POA: Diagnosis not present

## 2021-05-26 DIAGNOSIS — Z8546 Personal history of malignant neoplasm of prostate: Secondary | ICD-10-CM | POA: Diagnosis not present

## 2021-05-26 NOTE — Patient Instructions (Signed)
[name] , Thank you for taking time to come for your Medicare Wellness Visit. I appreciate your ongoing commitment to your health goals. Please review the following plan we discussed and let me know if I can assist you in the future.   Screening recommendations/referrals: Colonoscopy: done 01/15/19. Repeat 01/2024 Recommended yearly ophthalmology/optometry visit for glaucoma screening and checkup Recommended yearly dental visit for hygiene and checkup  Vaccinations: Influenza vaccine: done 02/12/21 Pneumococcal vaccine: done 08/11/20 Tdap vaccine: due Shingles vaccine: Shingrix discussed. Please contact your pharmacy for coverage information.  Covid-19: done 03/04/19, 03/28/19 & 01/15/20  Advanced directives: Advance directive discussed with you today. I have provided a copy for you to complete at home and have notarized. Once this is complete please bring a copy in to our office so we can scan it into your chart.   Conditions/risks identified: Keep up the great work!   Next appointment: Follow up in one year for your annual wellness visit.   Preventive Care 69 Years and Older, Male Preventive care refers to lifestyle choices and visits with your health care provider that can promote health and wellness. What does preventive care include? A yearly physical exam. This is also called an annual well check. Dental exams once or twice a year. Routine eye exams. Ask your health care provider how often you should have your eyes checked. Personal lifestyle choices, including: Daily care of your teeth and gums. Regular physical activity. Eating a healthy diet. Avoiding tobacco and drug use. Limiting alcohol use. Practicing safe sex. Taking low doses of aspirin every day. Taking vitamin and mineral supplements as recommended by your health care provider. What happens during an annual well check? The services and screenings done by your health care provider during your annual well check will  depend on your age, overall health, lifestyle risk factors, and family history of disease. Counseling  Your health care provider may ask you questions about your: Alcohol use. Tobacco use. Drug use. Emotional well-being. Home and relationship well-being. Sexual activity. Eating habits. History of falls. Memory and ability to understand (cognition). Work and work Statistician. Screening  You may have the following tests or measurements: Height, weight, and BMI. Blood pressure. Lipid and cholesterol levels. These may be checked every 5 years, or more frequently if you are over 33 years old. Skin check. Lung cancer screening. You may have this screening every year starting at age 64 if you have a 30-pack-year history of smoking and currently smoke or have quit within the past 15 years. Fecal occult blood test (FOBT) of the stool. You may have this test every year starting at age 61. Flexible sigmoidoscopy or colonoscopy. You may have a sigmoidoscopy every 5 years or a colonoscopy every 10 years starting at age 81. Prostate cancer screening. Recommendations will vary depending on your family history and other risks. Hepatitis C blood test. Hepatitis B blood test. Sexually transmitted disease (STD) testing. Diabetes screening. This is done by checking your blood sugar (glucose) after you have not eaten for a while (fasting). You may have this done every 1-3 years. Abdominal aortic aneurysm (AAA) screening. You may need this if you are a current or former smoker. Osteoporosis. You may be screened starting at age 81 if you are at high risk. Talk with your health care provider about your test results, treatment options, and if necessary, the need for more tests. Vaccines  Your health care provider may recommend certain vaccines, such as: Influenza vaccine. This is recommended every year.  Tetanus, diphtheria, and acellular pertussis (Tdap, Td) vaccine. You may need a Td booster every 10  years. Zoster vaccine. You may need this after age 58. Pneumococcal 13-valent conjugate (PCV13) vaccine. One dose is recommended after age 23. Pneumococcal polysaccharide (PPSV23) vaccine. One dose is recommended after age 25. Talk to your health care provider about which screenings and vaccines you need and how often you need them. This information is not intended to replace advice given to you by your health care provider. Make sure you discuss any questions you have with your health care provider. Document Released: 01/17/2015 Document Revised: 09/10/2015 Document Reviewed: 10/22/2014 Elsevier Interactive Patient Education  2017 [place] Prevention in the Home Falls can cause injuries. They can happen to people of all ages. There are many things you can do to make your home safe and to help prevent falls. What can I do on the outside of my home? Regularly fix the edges of walkways and driveways and fix any cracks. Remove anything that might make you trip as you walk through a door, such as a raised step or threshold. Trim any bushes or trees on the path to your home. Use bright outdoor lighting. Clear any walking paths of anything that might make someone trip, such as rocks or tools. Regularly check to see if handrails are loose or broken. Make sure that both sides of any steps have handrails. Any raised decks and porches should have guardrails on the edges. Have any leaves, snow, or ice cleared regularly. Use sand or salt on walking paths during winter. Clean up any spills in your garage right away. This includes oil or grease spills. What can I do in the bathroom? Use night lights. Install grab bars by the toilet and in the tub and shower. Do not use towel bars as grab bars. Use non-skid mats or decals in the tub or shower. If you need to sit down in the shower, use a plastic, non-slip stool. Keep the floor dry. Clean up any water that spills on the floor as soon as it  happens. Remove soap buildup in the tub or shower regularly. Attach bath mats securely with double-sided non-slip rug tape. Do not have throw rugs and other things on the floor that can make you trip. What can I do in the bedroom? Use night lights. Make sure that you have a light by your bed that is easy to reach. Do not use any sheets or blankets that are too big for your bed. They should not hang down onto the floor. Have a firm chair that has side arms. You can use this for support while you get dressed. Do not have throw rugs and other things on the floor that can make you trip. What can I do in the kitchen? Clean up any spills right away. Avoid walking on wet floors. Keep items that you use a lot in easy-to-reach places. If you need to reach something above you, use a strong step stool that has a grab bar. Keep electrical cords out of the way. Do not use floor polish or wax that makes floors slippery. If you must use wax, use non-skid floor wax. Do not have throw rugs and other things on the floor that can make you trip. What can I do with my stairs? Do not leave any items on the stairs. Make sure that there are handrails on both sides of the stairs and use them. Fix handrails that are broken or loose.  Make sure that handrails are as long as the stairways. Check any carpeting to make sure that it is firmly attached to the stairs. Fix any carpet that is loose or worn. Avoid having throw rugs at the top or bottom of the stairs. If you do have throw rugs, attach them to the floor with carpet tape. Make sure that you have a light switch at the top of the stairs and the bottom of the stairs. If you do not have them, ask someone to add them for you. What else can I do to help prevent falls? Wear shoes that: Do not have high heels. Have rubber bottoms. Are comfortable and fit you well. Are closed at the toe. Do not wear sandals. If you use a stepladder: Make sure that it is fully opened.  Do not climb a closed stepladder. Make sure that both sides of the stepladder are locked into place. Ask someone to hold it for you, if possible. Clearly mark and make sure that you can see: Any grab bars or handrails. First and last steps. Where the edge of each step is. Use tools that help you move around (mobility aids) if they are needed. These include: Canes. Walkers. Scooters. Crutches. Turn on the lights when you go into a dark area. Replace any light bulbs as soon as they burn out. Set up your furniture so you have a clear path. Avoid moving your furniture around. If any of your floors are uneven, fix them. If there are any pets around you, be aware of where they are. Review your medicines with your doctor. Some medicines can make you feel dizzy. This can increase your chance of falling. Ask your doctor what other things that you can do to help prevent falls. This information is not intended to replace advice given to you by your health care provider. Make sure you discuss any questions you have with your health care provider. Document Released: 10/17/2008 Document Revised: 05/29/2015 Document Reviewed: 01/25/2014 Elsevier Interactive Patient Education  2017 [employer].

## 2021-05-26 NOTE — Progress Notes (Signed)
Subjective:   Cameron Glover is a 71 y.o. male who presents for Medicare Annual/Subsequent preventive examination.  Virtual Visit via Telephone Note  I connected with  Cameron Glover on 05/26/21 at  4:00 PM EDT by telephone and verified that I am speaking with the correct person using two identifiers.  Location: Patient: home Provider: [place] Persons participating in the virtual visit: [place]   I discussed the limitations, risks, security and privacy concerns of performing an evaluation and management service by telephone and the availability of in person appointments. The patient expressed understanding and agreed to proceed.  Interactive audio and video telecommunications were attempted between this nurse and patient, however failed, due to patient having technical difficulties OR patient did not have access to video capability.  We continued and completed visit with audio only.  Some vital signs may be absent or patient reported.   Clemetine Marker, LPN   Review of Systems     Cardiac Risk Factors include: advanced age (>60mn, >>82women);diabetes mellitus;dyslipidemia;male gender;hypertension     Objective:    There were no vitals filed for this visit. There is no height or weight on file to calculate BMI.     05/26/2021    4:04 PM 04/08/2020    2:26 PM 04/05/2019    2:59 PM 01/15/2019    8:06 AM 09/02/2017   11:39 PM  Advanced Directives  Does Patient Have a Medical Advance Directive? _0   Would patient like information on creating a medical advance directive? Yes (MAU/Ambulatory/Procedural Areas - Information given) Yes (MAU/Ambulatory/Procedural Areas - Information given) No - Patient declined No - Patient declined Yes (Inpatient - patient requests chaplain consult to create a medical advance directive)    Current Medications (verified) Outpatient Encounter Medications as of 05/26/2021  Medication Sig   Accu-Chek Softclix Lancets lancets Use as  instructed to check blood sugar   amLODipine (NORVASC) 10 MG tablet Take 1 tablet (10 mg total) by mouth daily.   atorvastatin (LIPITOR) 40 MG tablet TAKE 1 TABLET BY MOUTH EVERY DAY   Blood Glucose Monitoring Suppl (ACCU-CHEK GUIDE ME) w/Device KIT by Does not apply route. Sample glucometer provided in office   FARXIGA 10 MG TABS tablet Take 10 mg by mouth daily.   glucose blood (ACCU-CHEK GUIDE) test strip Use as instructed to check blood sugar   lisinopril-hydrochlorothiazide (ZESTORETIC) 20-12.5 MG tablet Take 1 tablet by mouth daily.   metFORMIN (GLUCOPHAGE) 1000 MG tablet TAKE 1 TABLET BY MOUTH TWICE A DAY WITH A MEAL   pantoprazole (PROTONIX) 40 MG tablet TAKE 1 TABLET BY MOUTH EVERY DAY   [DISCONTINUED] Prednisol Ace-Moxiflox-Bromfen 1-0.5-0.075 % SUSP Place 1 drop into the left eye 4 (four) times daily.   No facility-administered encounter medications on file as of 05/26/2021.    Allergies (verified) Patient has no known allergies.   History: Past Medical History:  Diagnosis Date   [place] [facility] [place]    [facility] [place] 09/[place]2017   Anemia due to chronic kidney disease 04/15/2018   DM (diabetes mellitus) with complications ([facility] 81/61/0960  Elevated lipase 04/15/2018   Hyperglycemia without ketosis    Hyperlipidemia    Hypertension    Nuclear sclerotic cataract of left eye 05/14/2019   Past Surgical History:  Procedure Laterality Date   COLONOSCOPY WITH PROPOFOL N/A 01/15/2019   Procedure: COLONOSCOPY WITH PROPOFOL;  Surgeon: AJonathon Bellows MD;  [facility]  Service: Gastroenterology;  Laterality: N/A;  Family History  Problem Relation Age of Onset   Kidney failure Sister    Cancer Sister    Diabetes Sister    Hypertension Sister    Hyperlipidemia Sister    Heart disease Sister    Kidney disease Sister    Cancer Brother    CAD Brother    Diabetes Brother    Hypertension Brother    Hyperlipidemia Brother    Heart disease  Brother    Diabetes Other    Cancer Mother    Social History   Socioeconomic History   Marital status: Single    Spouse name: Not on file   Number of children: Not on file   Years of education: Not on file   Highest education level: High school graduate  Occupational History   Occupation: Retired    Comment: Administrator, [employer]  Tobacco Use   Smoking status: Never   Smokeless tobacco: Never  Scientific laboratory technician Use: Never used  Substance and Sexual Activity   Alcohol use: Never   Drug use: Never   Sexual activity: Yes    Comment: couple male partners  Other Topics Concern   Not on file  Social History Narrative   Pt lives with his nephew   Social Determinants of Health   Financial Resource Strain: Low Risk    Difficulty of Paying Living Expenses: Not very hard  Food Insecurity: No Food Insecurity   Worried About Charity fundraiser in the Last Year: Never true   [place] in the Last Year: Never true  Transportation Needs: No Transportation Needs   Lack of Transportation (Medical): No   Lack of Transportation (Non-Medical): No  Physical Activity: Sufficiently Active   Days of Exercise per Week: 5 days   Minutes of Exercise per Session: 30 min  Stress: No Stress Concern Present   Feeling of Stress : Not at all  Social Connections: Socially Isolated   Frequency of Communication with Friends and Family: More than three times a week   Frequency of Social Gatherings with Friends and Family: More than three times a week   Attends Religious Services: Never   Marine scientist or Organizations: No   Attends Music therapist: Never   Marital Status: Never married    Tobacco Counseling Counseling given: Not Answered   Clinical Intake:  Pre-visit preparation completed: Yes  Pain : No/denies pain     Nutritional Risks: None Diabetes: Yes CBG done?: No Did pt. bring in CBG monitor from home?: No  How often do you need to have someone help  you when you read instructions, pamphlets, or other written materials from your doctor or pharmacy?: 1 - Never  Nutrition Risk Assessment:  Has the patient had any N/V/D within the last 2 months?  No  Does the patient have any non-healing wounds?  No  Has the patient had any unintentional weight loss or weight gain?  No   Diabetes:  Is the patient diabetic?  Yes  If diabetic, was a CBG obtained today?  No  Did the patient bring in their glucometer from home?  No  How often do you monitor your CBG's? Several times per week.   Financial Strains and Diabetes Management:  Are you having any financial strains with the device, your supplies or your medication? No .  Does the patient want to be seen by Chronic Care Management for management of their diabetes?  No  Would the patient  like to be referred to a Nutritionist or for Diabetic Management?  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     Interpreter Needed?: No  Information entered by :: Clemetine Marker LPN   Activities of Daily Living    05/26/2021    4:05 PM 02/12/2021   11:05 AM  In your present state of health, do you have any difficulty performing the following activities:  Hearing? 0 0  Vision? 0 0  Difficulty concentrating or making decisions? 0 0  Walking or climbing stairs? 0 0  Dressing or bathing? 0 0  Doing errands, shopping? 0 0  Preparing Food and eating ? N   Using the Toilet? N   In the past six months, have you accidently leaked urine? N   Do you have problems with loss of bowel control? N   Managing your Medications? N   Managing your Finances? N   Housekeeping or managing your Housekeeping? N     Patient Care Team: [name], PA-C as PCP - General (Family Medicine)  Indicate any recent Medical Services you may have received from other than [facility] providers in the past year (date may be approximate).     Assessment:    This is a routine wellness examination for Cameron Glover.  Hearing/Vision screen Hearing Screening - Comments::  Pt denies hearing difficulty Vision Screening - Comments:: Annual vision screenings done by Dr. Deloria Lair in [place]  Dietary issues and exercise activities discussed: Current Exercise Habits: Home exercise routine, Type of exercise: walking, Time (Minutes): 30, Frequency (Times/Week): 5, Weekly Exercise (Minutes/Week): 150, Intensity: Mild, Exercise limited by: None identified   Goals Addressed   None    Depression Screen    05/26/2021    4:04 PM 02/12/2021   11:06 AM 08/11/2020    9:16 AM 04/08/2020    2:25 PM 03/25/2020    1:25 PM 02/11/2020   10:56 AM 07/23/2019    9:27 AM  PHQ 2/9 Scores  PHQ - 2 Score 0 2 0 0 0 0 0  PHQ- 9 Score  4 0   0 0    Fall Risk    05/26/2021    4:05 PM 02/12/2021   11:05 AM 08/11/2020    9:13 AM 04/08/2020    2:28 PM 03/25/2020    1:25 PM  Fall Risk   Falls in the past year? 0 0 0 0 0  Number falls in past yr: 0 0 0 0 0  Injury with Fall? 0 0 0 0 0  Risk for fall due to : No Fall Risks   No Fall Risks   Follow up Falls prevention discussed Falls evaluation completed  Falls prevention discussed Falls evaluation completed    FALL RISK PREVENTION PERTAINING TO THE HOME:  Any stairs in or around the home? Yes  If so, are there any without handrails? No  Home free of loose throw rugs in walkways, pet beds, electrical cords, etc? Yes  Adequate lighting in your home to reduce risk of falls? Yes   ASSISTIVE DEVICES UTILIZED TO PREVENT FALLS:  Life alert? No  Use of a cane, walker or w/c? No  Grab bars in the bathroom? No  Shower chair or bench in shower? No  Elevated toilet seat or a handicapped toilet? No   TIMED UP AND GO:  Was the test performed? No . Telephonic visit.   Cognitive Function: Normal cognitive status assessed  by direct observation by this Nurse Health Advisor. No abnormalities found.          04/08/2020    2:29 PM 04/05/2019     3:04 PM  6CIT Screen  What Year? 0 points 0 points  What month? 0 points 0 points  What time? 0 points 0 points  Count back from 20 0 points 0 points  Months in reverse 4 points 4 points  Repeat phrase 0 points 2 points  Total Score 4 points 6 points    Immunizations Immunization History  Administered Date(s) Administered   Fluad Quad(high Dose 65+) 02/12/2021   Influenza,inj,Quad PF,6+ Mos 10/04/2017   Influenza-Unspecified 10/04/2018   PFIZER(Purple Top)SARS-COV-2 Vaccination 03/04/2019, 03/28/2019, 01/15/2020   PNEUMOCOCCAL CONJUGATE-20 08/11/2020   Pneumococcal Conjugate-13 07/23/2019   Pneumococcal Polysaccharide-23 09/03/2017    TDAP status: Due, Education has been provided regarding the importance of this vaccine. Advised may receive this vaccine at local pharmacy or Health Dept. Aware to provide a copy of the vaccination record if obtained from local pharmacy or Health Dept. Verbalized acceptance and understanding.  Flu Vaccine status: Up to date  Pneumococcal vaccine status: Up to date  Covid-19 vaccine status: Completed vaccines  Qualifies for Shingles Vaccine? Yes   Zostavax completed No   Shingrix Completed?: No.    Education has been provided regarding the importance of this vaccine. Patient has been advised to call insurance company to determine out of pocket expense if they have not yet received this vaccine. Advised may also receive vaccine at local pharmacy or Health Dept. Verbalized acceptance and understanding.  Screening Tests Health Maintenance  Topic Date Due   TETANUS/TDAP  Never done   Zoster Vaccines- Shingrix (1 of 2) Never done   COVID-19 Vaccine (4 - Booster for Pfizer series) 03/11/2020   INFLUENZA VACCINE  08/04/2021   FOOT EXAM  08/11/2021   HEMOGLOBIN A1C  08/12/2021   OPHTHALMOLOGY EXAM  04/30/2022   COLONOSCOPY (Pts 45-34yrs Insurance coverage will need to be confirmed)  01/15/2024   Pneumonia Vaccine 17+ Years old  Completed    Hepatitis C Screening  Completed   HPV VACCINES  Aged Out    Health Maintenance  Health Maintenance Due  Topic Date Due   TETANUS/TDAP  Never done   Zoster Vaccines- Shingrix (1 of 2) Never done   COVID-19 Vaccine (4 - Booster for Pfizer series) 03/11/2020    Colorectal cancer screening: Type of screening: Colonoscopy. Completed 01/15/19. Repeat every 5 years  Lung Cancer Screening: (Low Dose CT Chest recommended if Age 83-80 years, 30 pack-year currently smoking OR have quit w/in 15years.) does not qualify.  Additional Screening:  Hepatitis C Screening: does qualify; Completed 04/12/18  Vision Screening: Recommended annual ophthalmology exams for early detection of glaucoma and other disorders of the eye. Is the patient up to date with their annual eye exam?  Yes  Who is the provider or what is the name of the office in which the patient attends annual eye exams? Dr. Zadie Rhine.   Dental Screening: Recommended annual dental exams for proper oral hygiene  Community Resource Referral / Chronic Care Management: CRR required this visit?  No   CCM required this visit?  No      Plan:     I have personally reviewed and noted the following in the patient's chart:   Medical and social history Use of alcohol, tobacco or illicit drugs  Current medications and supplements including opioid prescriptions. Patient is not currently taking opioid prescriptions.  Functional ability and status Nutritional status Physical activity Advanced directives List of other physicians Hospitalizations, surgeries, and ER visits in previous 12 months Vitals Screenings to include cognitive, depression, and falls Referrals and appointments  In addition, I have reviewed and discussed with patient certain preventive protocols, quality metrics, and best practice recommendations. A written personalized care plan for preventive services as well as general preventive health recommendations were provided to  patient.     Clemetine Marker, LPN   [id number]   Nurse Notes: none

## 2021-05-29 NOTE — Progress Notes (Signed)
Received letter from [facility] center for f/up cancer registry - pt has hx of prostate cancer adenocarcinoma, last contact withthen was 08/01/2017 Pt has never reported this hx and the records have not been available No results found for: PSA1, PSA  Unfortunately with CCM help for medicare well visits a PSA has never routinely been done and pt has never done a CPE only routine or acute OV so we have never discussed urinary sx or psa screening.  PSA ordered and referral placed to [facility] - which would be closest to pt's home.  Pt will be notified about [place] receiving this letter.  Labs will be mailed to him and hopefully we can get labs and est urology f/up on this    ICD-10-CM   1. Encounter for Medicare annual wellness exam  Z00.00 PSA    2. Screening for malignant neoplasm of prostate  Z12.5 PSA    Ambulatory referral to Urology    3. History of prostate cancer  Z85.46 PSA    Ambulatory referral to Urology     Will request records from [facility] [place] 05/29/21 8:41 PM

## 2021-05-29 NOTE — Addendum Note (Signed)
Addended by: [name] on: 05/29/2021 08:41 PM   Modules accepted: Orders

## 2021-06-10 ENCOUNTER — Encounter (INDEPENDENT_AMBULATORY_CARE_PROVIDER_SITE_OTHER): Payer: Medicare Other | Admitting: Ophthalmology

## 2021-06-16 ENCOUNTER — Ambulatory Visit (INDEPENDENT_AMBULATORY_CARE_PROVIDER_SITE_OTHER): Payer: Medicare Other | Admitting: Ophthalmology

## 2021-06-16 ENCOUNTER — Encounter (INDEPENDENT_AMBULATORY_CARE_PROVIDER_SITE_OTHER): Payer: Self-pay | Admitting: Ophthalmology

## 2021-06-16 DIAGNOSIS — E113412 Type 2 diabetes mellitus with severe nonproliferative diabetic retinopathy with macular edema, left eye: Secondary | ICD-10-CM | POA: Diagnosis not present

## 2021-06-16 DIAGNOSIS — E113411 Type 2 diabetes mellitus with severe nonproliferative diabetic retinopathy with macular edema, right eye: Secondary | ICD-10-CM | POA: Diagnosis not present

## 2021-06-16 MED ORDER — FARICIMAB-SVOA 6 MG/0.05ML IZ SOLN
6.0000 mg | INTRAVITREAL | Status: AC | PRN
  Administered 2021-06-16: 6 mg via INTRAVITREAL

## 2021-06-16 NOTE — Progress Notes (Signed)
06/16/2021     CHIEF COMPLAINT Patient presents for  Chief Complaint  Patient presents with   Diabetic Retinopathy with Macular Edema      HISTORY OF PRESENT ILLNESS: Cameron Glover is a 71 y.o. male who presents to the clinic today for:   HPI   6 weeks for DILATE OS, VABYSMO, OCT. Pt stated vision has been stable since last visit. Pt denies floaters and FOL.  Last edited by Angeline Slim on 06/16/2021  8:48 AM.      Referring physician: Danelle Berry, PA-C [address] [place],  [place] [address]  HISTORICAL INFORMATION:   Selected notes from the MEDICAL RECORD NUMBER    Lab Results  Component Value Date   HGBA1C 5.5 02/12/2021     CURRENT MEDICATIONS: No current outpatient medications on file. (Ophthalmic Drugs)   No current facility-administered medications for this visit. (Ophthalmic Drugs)   Current Outpatient Medications (Other)  Medication Sig   Accu-Chek Softclix Lancets lancets Use as instructed to check blood sugar   amLODipine (NORVASC) 10 MG tablet Take 1 tablet (10 mg total) by mouth daily.   atorvastatin (LIPITOR) 40 MG tablet TAKE 1 TABLET BY MOUTH EVERY DAY   Blood Glucose Monitoring Suppl (ACCU-CHEK GUIDE ME) w/Device KIT by Does not apply route. Sample glucometer provided in office   FARXIGA 10 MG TABS tablet Take 10 mg by mouth daily.   glucose blood (ACCU-CHEK GUIDE) test strip Use as instructed to check blood sugar   lisinopril-hydrochlorothiazide (ZESTORETIC) 20-12.5 MG tablet Take 1 tablet by mouth daily.   metFORMIN (GLUCOPHAGE) 1000 MG tablet TAKE 1 TABLET BY MOUTH TWICE A DAY WITH A MEAL   pantoprazole (PROTONIX) 40 MG tablet TAKE 1 TABLET BY MOUTH EVERY DAY   No current facility-administered medications for this visit. (Other)      REVIEW OF SYSTEMS: ROS   Negative for: Constitutional, Gastrointestinal, Neurological, Skin, Genitourinary, Musculoskeletal, HENT, Endocrine, Cardiovascular, Eyes, Respiratory, Psychiatric,  Allergic/Imm, Heme/Lymph Last edited by Angeline Slim on 06/16/2021  8:48 AM.       ALLERGIES No Known Allergies  PAST MEDICAL HISTORY Past Medical History:  Diagnosis Date   Acute renal failure (ARF) (HCC)    AKI (acute kidney injury) (HCC) 09/02/2017   Anemia due to chronic kidney disease 04/15/2018   DM (diabetes mellitus) with complications (HCC) 09/02/2017   Elevated lipase 04/15/2018   Hyperglycemia without ketosis    Hyperlipidemia    Hypertension    Nuclear sclerotic cataract of left eye 05/14/2019   Past Surgical History:  Procedure Laterality Date   COLONOSCOPY WITH PROPOFOL N/A 01/15/2019   Procedure: COLONOSCOPY WITH PROPOFOL;  Surgeon: Wyline Mood, MD;  Location: [facility] ENDOSCOPY;  Service: Gastroenterology;  Laterality: N/A;    FAMILY HISTORY Family History  Problem Relation Age of Onset   Kidney failure Sister    Cancer Sister    Diabetes Sister    Hypertension Sister    Hyperlipidemia Sister    Heart disease Sister    Kidney disease Sister    Cancer Brother    CAD Brother    Diabetes Brother    Hypertension Brother    Hyperlipidemia Brother    Heart disease Brother    Diabetes Other    Cancer Mother     SOCIAL HISTORY Social History   Tobacco Use   Smoking status: Never   Smokeless tobacco: Never  Vaping Use   Vaping Use: Never used  Substance Use Topics   Alcohol use: Never  Drug use: Never         OPHTHALMIC EXAM:  Base Eye Exam     Visual Acuity (ETDRS)       Right Left   Dist [place] 20/20 20/40   Dist ph [place]  20/30 -1         Tonometry (Tonopen, 8:53 AM)       Right Left   Pressure 12 13         Pupils       Pupils APD   Right PERRL None   Left PERRL None         Visual Fields       Left Right    Full Full         Extraocular Movement       Right Left    Full Full         Neuro/Psych     Oriented x3: Yes   Mood/Affect: Normal         Dilation     Left eye: 2.5% Phenylephrine, 1.0% Mydriacyl @ 8:53  AM           Slit Lamp and Fundus Exam     External Exam       Right Left   External Normal Normal         Slit Lamp Exam       Right Left   Lids/Lashes Normal Normal   Conjunctiva/Sclera White and quiet White and quiet   Cornea Clear Clear   Anterior Chamber Deep and quiet Deep and quiet   Iris Round and reactive Round and reactive   Lens 2.5+ Nuclear sclerosis Centered posterior chamber intraocular lens   Anterior Vitreous Normal Normal         Fundus Exam       Right Left   Posterior Vitreous  Posterior vitreous detachment   Disc  Normal   C/D Ratio  0.5   Macula  Microaneurysms, , no clinically significant macular edema detectable clinically   Vessels  NPDR severe   Periphery  Normal            IMAGING AND PROCEDURES  Imaging and Procedures for 06/16/21  OCT, Retina - OU - Both Eyes       Right Eye Quality was good. Scan locations included subfoveal. Central Foveal Thickness: 308. Progression has improved. Findings include abnormal foveal contour, vitreomacular adhesion .   Left Eye Quality was good. Scan locations included subfoveal. Central Foveal Thickness: 369. Progression has improved. Findings include abnormal foveal contour, vitreomacular adhesion .   Notes  OD  with focal CSME SUPERIOR to faz, much improved over the years compared to November, remained stable post focal laser treatment right eye, today at 3 months post most recent injection Avastin and some 4 months post most recent treatment antivegF treatment superiorly.  Follow-up as scheduled OD, no treatment needed today  OS now stabilized and active and center involved CSME, post Eylea at current interval of 7 weeks  OS vastly improved OS, now on Eylea still active.  Repeat injection today to control       Intravitreal Injection, Pharmacologic Agent - OS - Left Eye       Time Out 06/16/2021. 9:29 AM. Confirmed correct patient, procedure, site, and patient consented.    Anesthesia Topical anesthesia was used. Anesthetic medications included Lidocaine 4%.   Procedure Preparation included 5% betadine to ocular surface, 10% betadine to eyelids, Ofloxacin . A 30 gauge needle was used.  Injection: 6 mg faricimab-svoa 6 MG/0.05ML   Route: Intravitreal, Site: Left Eye   NDC: [phone], Lot: [id number], Expiration date: 12/05/2022, Waste: 0 mL   Post-op Post injection exam found visual acuity of at least counting fingers. The patient tolerated the procedure well. There were no complications. The patient received written and verbal post procedure care education. Post injection medications included ocuflox.              ASSESSMENT/PLAN:  Severe nonproliferative diabetic retinopathy of left eye, with macular edema, associated with type 2 diabetes mellitus (HCC) Chronic active CSME OS, no real change in thickness since onset of switch from Avastin to [facility] February 2023.  Repeat injection today antivegF for center involved CSME using Vabysmo  Severe nonproliferative diabetic retinopathy of right eye, with macular edema, associated with type 2 diabetes mellitus ([place]) None center involved minor CSME.  We will schedule focal laser treatment near future     ICD-10-CM   1. Severe nonproliferative diabetic retinopathy of left eye, with macular edema, associated with type 2 diabetes mellitus (HCC)  K91.7915 OCT, Retina - OU - Both Eyes    Intravitreal Injection, Pharmacologic Agent - OS - Left Eye    faricimab-svoa (VABYSMO) 6mg /0.55mL intravitreal injection    2. Severe nonproliferative diabetic retinopathy of right eye, with macular edema, associated with type 2 diabetes mellitus ([place])  E11.3411       OD noncenter involved CSME.  Will need to schedule focal laser treatment in the coming weeks for this region inferiorly and superior to FAZ, OCT guided  2.  OS, center involved CSME.  Nonresponsive to Avastin now for 3 injections Eylea.  We will need  to change to Vabysmo, commence today  3.  Ophthalmic Meds Ordered this visit:  Meds ordered this encounter  Medications   faricimab-svoa (VABYSMO) 6mg /0.35mL intravitreal injection       Return in about 2 weeks (around 06/30/2021) for dilate, OD, OCT, FOCAL inferiorly and superior to FAZ,,, and 6 weeks Vabysmo OCT OS.  There are no Patient Instructions on file for this visit.   Explained the diagnoses, plan, and follow up with the patient and they expressed understanding.  Patient expressed understanding of the importance of proper follow up care.   Clent Demark Karl Knarr M.D. Diseases & Surgery of the Retina and Vitreous Retina & Diabetic [place] 06/16/21     Abbreviations: M myopia (nearsighted); A astigmatism; H hyperopia (farsighted); P presbyopia; Mrx spectacle prescription;  CTL contact lenses; OD right eye; OS left eye; OU both eyes  XT exotropia; ET esotropia; PEK punctate epithelial keratitis; PEE punctate epithelial erosions; DES dry eye syndrome; MGD meibomian gland dysfunction; ATs artificial tears; PFAT's preservative free artificial tears; [place] nuclear sclerotic cataract; PSC posterior subcapsular cataract; ERM epi-retinal membrane; PVD posterior vitreous detachment; RD retinal detachment; DM diabetes mellitus; DR diabetic retinopathy; NPDR non-proliferative diabetic retinopathy; PDR proliferative diabetic retinopathy; CSME clinically significant macular edema; DME diabetic macular edema; dbh dot blot hemorrhages; CWS cotton wool spot; POAG primary open angle glaucoma; C/D cup-to-disc ratio; HVF humphrey visual field; GVF goldmann visual field; OCT optical coherence tomography; IOP intraocular pressure; BRVO Branch retinal vein occlusion; CRVO central retinal vein occlusion; CRAO central retinal artery occlusion; BRAO branch retinal artery occlusion; RT retinal tear; SB scleral buckle; PPV pars plana vitrectomy; VH Vitreous hemorrhage; PRP panretinal laser photocoagulation; IVK  intravitreal kenalog; VMT vitreomacular traction; MH Macular hole;  NVD neovascularization of the disc; NVE neovascularization elsewhere; AREDS age related eye disease  study; ARMD age related macular degeneration; POAG primary open angle glaucoma; EBMD epithelial/anterior basement membrane dystrophy; ACIOL anterior chamber intraocular lens; IOL intraocular lens; PCIOL posterior chamber intraocular lens; Phaco/IOL phacoemulsification with intraocular lens placement; [place] photorefractive keratectomy; LASIK laser assisted in situ keratomileusis; HTN hypertension; DM diabetes mellitus; COPD chronic obstructive pulmonary disease

## 2021-06-16 NOTE — Assessment & Plan Note (Signed)
Chronic active CSME OS, no real change in thickness since onset of switch from Avastin to [facility] February 2023.  Repeat injection today antivegF for center involved CSME using Vabysmo

## 2021-06-16 NOTE — Assessment & Plan Note (Signed)
None center involved minor CSME.  We will schedule focal laser treatment near future

## 2021-06-30 ENCOUNTER — Encounter (INDEPENDENT_AMBULATORY_CARE_PROVIDER_SITE_OTHER): Payer: Self-pay | Admitting: Ophthalmology

## 2021-06-30 ENCOUNTER — Ambulatory Visit (INDEPENDENT_AMBULATORY_CARE_PROVIDER_SITE_OTHER): Payer: Medicare Other | Admitting: Ophthalmology

## 2021-06-30 DIAGNOSIS — E113412 Type 2 diabetes mellitus with severe nonproliferative diabetic retinopathy with macular edema, left eye: Secondary | ICD-10-CM | POA: Diagnosis not present

## 2021-06-30 DIAGNOSIS — E113411 Type 2 diabetes mellitus with severe nonproliferative diabetic retinopathy with macular edema, right eye: Secondary | ICD-10-CM

## 2021-06-30 NOTE — Assessment & Plan Note (Signed)
OS follow-up as scheduled likely Vabysmo OS

## 2021-07-28 ENCOUNTER — Encounter (INDEPENDENT_AMBULATORY_CARE_PROVIDER_SITE_OTHER): Payer: Self-pay | Admitting: Ophthalmology

## 2021-07-28 ENCOUNTER — Ambulatory Visit (INDEPENDENT_AMBULATORY_CARE_PROVIDER_SITE_OTHER): Payer: Medicare Other | Admitting: Ophthalmology

## 2021-07-28 DIAGNOSIS — E113412 Type 2 diabetes mellitus with severe nonproliferative diabetic retinopathy with macular edema, left eye: Secondary | ICD-10-CM

## 2021-07-28 MED ORDER — FARICIMAB-SVOA 6 MG/0.05ML IZ SOLN
6.0000 mg | INTRAVITREAL | Status: AC | PRN
  Administered 2021-07-28: 6 mg via INTRAVITREAL

## 2021-07-28 NOTE — Assessment & Plan Note (Signed)
Switch from [facility] to Vabysmo June 2023 for increasing CSME  Post injection #1 CSME is worsened on Vabysmo repeat injection of Vabysmo today

## 2021-07-28 NOTE — Progress Notes (Signed)
07/28/2021     CHIEF COMPLAINT Patient presents for  Chief Complaint  Patient presents with   Diabetic Retinopathy with Macular Edema      HISTORY OF PRESENT ILLNESS: Cameron Glover is a 71 y.o. male who presents to the clinic today for:   HPI   6 week vabysmo OS OCT with history of CSME resistant to Avastin and recently to [facility] Pt states her vision has been stable  Pt denies any new floaters or FOL   Last edited by Hurman Horn, MD on 07/28/2021 11:03 AM.      Referring physician: [name], PA-C [address] [place],  [place] [address]  HISTORICAL INFORMATION:   Selected notes from the MEDICAL RECORD NUMBER    Lab Results  Component Value Date   HGBA1C 5.5 02/12/2021     CURRENT MEDICATIONS: No current outpatient medications on file. (Ophthalmic Drugs)   No current facility-administered medications for this visit. (Ophthalmic Drugs)   Current Outpatient Medications (Other)  Medication Sig   Accu-Chek Softclix Lancets lancets Use as instructed to check blood sugar   amLODipine (NORVASC) 10 MG tablet Take 1 tablet (10 mg total) by mouth daily.   atorvastatin (LIPITOR) 40 MG tablet TAKE 1 TABLET BY MOUTH EVERY DAY   Blood Glucose Monitoring Suppl (ACCU-CHEK GUIDE ME) w/Device KIT by Does not apply route. Sample glucometer provided in office   FARXIGA 10 MG TABS tablet Take 10 mg by mouth daily.   glucose blood (ACCU-CHEK GUIDE) test strip Use as instructed to check blood sugar   lisinopril-hydrochlorothiazide (ZESTORETIC) 20-12.5 MG tablet Take 1 tablet by mouth daily.   metFORMIN (GLUCOPHAGE) 1000 MG tablet TAKE 1 TABLET BY MOUTH TWICE A DAY WITH A MEAL   pantoprazole (PROTONIX) 40 MG tablet TAKE 1 TABLET BY MOUTH EVERY DAY   No current facility-administered medications for this visit. (Other)      REVIEW OF SYSTEMS: ROS   Negative for: Constitutional, Gastrointestinal, Neurological, Skin, Genitourinary, Musculoskeletal, HENT, Endocrine,  Cardiovascular, Eyes, Respiratory, Psychiatric, Allergic/Imm, Heme/Lymph Last edited by Orene Desanctis D, CMA on 07/28/2021  9:42 AM.       ALLERGIES No Known Allergies  PAST MEDICAL HISTORY Past Medical History:  Diagnosis Date   Acute renal failure (ARF) ([place])    AKI (acute kidney injury) ([place]) 09/02/2017   Anemia due to chronic kidney disease 04/15/2018   DM (diabetes mellitus) with complications ([place]) 9/37/3428   Elevated lipase 04/15/2018   Hyperglycemia without ketosis    Hyperlipidemia    Hypertension    Nuclear sclerotic cataract of left eye 05/14/2019   Past Surgical History:  Procedure Laterality Date   COLONOSCOPY WITH PROPOFOL N/A 01/15/2019   Procedure: COLONOSCOPY WITH PROPOFOL;  Surgeon: Jonathon Bellows, MD;  Location: [facility] ENDOSCOPY;  Service: Gastroenterology;  Laterality: N/A;    FAMILY HISTORY Family History  Problem Relation Age of Onset   Kidney failure Sister    Cancer Sister    Diabetes Sister    Hypertension Sister    Hyperlipidemia Sister    Heart disease Sister    Kidney disease Sister    Cancer Brother    CAD Brother    Diabetes Brother    Hypertension Brother    Hyperlipidemia Brother    Heart disease Brother    Diabetes Other    Cancer Mother     SOCIAL HISTORY Social History   Tobacco Use   Smoking status: Never   Smokeless tobacco: Never  Vaping Use  Vaping Use: Never used  Substance Use Topics   Alcohol use: Never   Drug use: Never         OPHTHALMIC EXAM:  Base Eye Exam     Visual Acuity (ETDRS)       Right Left   Dist [place] 20/20 20/40 +1         Tonometry (Tonopen, 9:47 AM)       Right Left   Pressure 13 19         Pupils       Pupils Shape APD   Right PERRL Round None   Left PERRL Round None         Visual Fields       Left Right    Full Full         Extraocular Movement       Right Left    Full, Ortho Full, Ortho         Neuro/Psych     Oriented x3: Yes   Mood/Affect: Normal          Dilation     Left eye: 2.5% Phenylephrine, 1.0% Mydriacyl @ 9:43 AM           Slit Lamp and Fundus Exam     External Exam       Right Left   External Normal Normal         Slit Lamp Exam       Right Left   Lids/Lashes Normal Normal   Conjunctiva/Sclera White and quiet White and quiet   Cornea Clear Clear   Anterior Chamber Deep and quiet Deep and quiet   Iris Round and reactive Round and reactive   Lens 2.5+ Nuclear sclerosis Centered posterior chamber intraocular lens   Anterior Vitreous Normal Normal         Fundus Exam       Right Left   Posterior Vitreous  Posterior vitreous detachment   Disc  Normal   C/D Ratio  0.5   Macula  Microaneurysms, , no clinically significant macular edema detectable clinically   Vessels  NPDR severe   Periphery  Normal            IMAGING AND PROCEDURES  Imaging and Procedures for 07/28/21  OCT, Retina - OU - Both Eyes       Right Eye Quality was good. Scan locations included subfoveal. Central Foveal Thickness: 308. Progression has improved. Findings include abnormal foveal contour, vitreomacular adhesion .   Left Eye Quality was good. Scan locations included subfoveal. Central Foveal Thickness: 478. Progression has worsened. Findings include abnormal foveal contour, vitreomacular adhesion .   Notes  OD  with focal CSME SUPERIOR to faz, post recetn focal laser, observe  t OS now stabilized and active and center involved CSME, injection Vabysmo at 6 weeks  OS vastly improved OS, now on Eylea still active.  Repeat injection today to control      Intravitreal Injection, Pharmacologic Agent - OS - Left Eye       Time Out 07/28/2021. 10:53 AM. Confirmed correct patient, procedure, site, and patient consented.   Anesthesia Topical anesthesia was used. Anesthetic medications included Lidocaine 4%.   Procedure Preparation included 5% betadine to ocular surface, 10% betadine to eyelids, Ofloxacin . A 30  gauge needle was used.   Injection: 6 mg faricimab-svoa 6 MG/0.05ML   Route: Intravitreal, Site: Left Eye   NDC: [id number], Lot: [id number], Expiration date: 05/05/2023, Waste: 0 mL   Post-op  Post injection exam found visual acuity of at least counting fingers. The patient tolerated the procedure well. There were no complications. The patient received written and verbal post procedure care education. Post injection medications included ocuflox.              ASSESSMENT/PLAN:  Severe nonproliferative diabetic retinopathy of left eye, with macular edema, associated with type 2 diabetes mellitus ([place]) Switch from [facility] to [facility] June 2023 for increasing CSME  Post injection #1 CSME is worsened on Vabysmo repeat injection of Vabysmo today     ICD-10-CM   1. Severe nonproliferative diabetic retinopathy of left eye, with macular edema, associated with type 2 diabetes mellitus (HCC)  P36.6815 OCT, Retina - OU - Both Eyes    Intravitreal Injection, Pharmacologic Agent - OS - Left Eye    faricimab-svoa (VABYSMO) 46m/0.05mL intravitreal injection      1.  CSME OS persistent and active.  [facility]  Increased CSME today.  Repeat Vabysmo 1 more time  2.  3.  Ophthalmic Meds Ordered this visit:  Meds ordered this encounter  Medications   faricimab-svoa (VABYSMO) 676m0.05mL intravitreal injection       Return in about 5 weeks (around 09/01/2021) for dilate, OS, AVASTIN OCT,, this is a change.  There are no Patient Instructions on file for this visit.   Explained the diagnoses, plan, and follow up with the patient and they expressed understanding.  Patient expressed understanding of the importance of proper follow up care.   GaClent Demarkankin M.D. Diseases & Surgery of the Retina and Vitreous Retina & Diabetic [place]7/25/23     Abbreviations: M myopia (nearsighted); A astigmatism; H hyperopia (farsighted); P presbyopia; Mrx spectacle  prescription;  CTL contact lenses; OD right eye; OS left eye; OU both eyes  XT exotropia; ET esotropia; PEK punctate epithelial keratitis; PEE punctate epithelial erosions; DES dry eye syndrome; MGD meibomian gland dysfunction; ATs artificial tears; PFAT's preservative free artificial tears; [place]uclear sclerotic cataract; PSC posterior subcapsular cataract; ERM epi-retinal membrane; PVD posterior vitreous detachment; RD retinal detachment; DM diabetes mellitus; DR diabetic retinopathy; NPDR non-proliferative diabetic retinopathy; PDR proliferative diabetic retinopathy; CSME clinically significant macular edema; DME diabetic macular edema; dbh dot blot hemorrhages; CWS cotton wool spot; POAG primary open angle glaucoma; C/D cup-to-disc ratio; HVF humphrey visual field; GVF goldmann visual field; OCT optical coherence tomography; IOP intraocular pressure; BRVO Branch retinal vein occlusion; CRVO central retinal vein occlusion; CRAO central retinal artery occlusion; BRAO branch retinal artery occlusion; RT retinal tear; SB scleral buckle; PPV pars plana vitrectomy; VH Vitreous hemorrhage; PRP panretinal laser photocoagulation; IVK intravitreal kenalog; VMT vitreomacular traction; MH Macular hole;  NVD neovascularization of the disc; NVE neovascularization elsewhere; AREDS age related eye disease study; ARMD age related macular degeneration; POAG primary open angle glaucoma; EBMD epithelial/anterior basement membrane dystrophy; ACIOL anterior chamber intraocular lens; IOL intraocular lens; PCIOL posterior chamber intraocular lens; Phaco/IOL phacoemulsification with intraocular lens placement; PRNundahotorefractive keratectomy; LASIK laser assisted in situ keratomileusis; HTN hypertension; DM diabetes mellitus; COPD chronic obstructive pulmonary disease

## 2021-08-12 ENCOUNTER — Ambulatory Visit: Payer: Medicare Other | Admitting: Nurse Practitioner

## 2021-08-17 ENCOUNTER — Other Ambulatory Visit: Payer: Self-pay

## 2021-08-17 ENCOUNTER — Ambulatory Visit (INDEPENDENT_AMBULATORY_CARE_PROVIDER_SITE_OTHER): Payer: Medicare Other | Admitting: Nurse Practitioner

## 2021-08-17 ENCOUNTER — Encounter: Payer: Self-pay | Admitting: Nurse Practitioner

## 2021-08-17 VITALS — BP 126/72 | HR 98 | Temp 98.3°F | Resp 18 | Ht 65.0 in | Wt 171.9 lb

## 2021-08-17 DIAGNOSIS — R809 Proteinuria, unspecified: Secondary | ICD-10-CM

## 2021-08-17 DIAGNOSIS — E1169 Type 2 diabetes mellitus with other specified complication: Secondary | ICD-10-CM | POA: Diagnosis not present

## 2021-08-17 DIAGNOSIS — Z8546 Personal history of malignant neoplasm of prostate: Secondary | ICD-10-CM

## 2021-08-17 DIAGNOSIS — E1122 Type 2 diabetes mellitus with diabetic chronic kidney disease: Secondary | ICD-10-CM

## 2021-08-17 DIAGNOSIS — E113411 Type 2 diabetes mellitus with severe nonproliferative diabetic retinopathy with macular edema, right eye: Secondary | ICD-10-CM

## 2021-08-17 DIAGNOSIS — K219 Gastro-esophageal reflux disease without esophagitis: Secondary | ICD-10-CM | POA: Diagnosis not present

## 2021-08-17 DIAGNOSIS — N183 Chronic kidney disease, stage 3 unspecified: Secondary | ICD-10-CM

## 2021-08-17 DIAGNOSIS — E785 Hyperlipidemia, unspecified: Secondary | ICD-10-CM

## 2021-08-17 DIAGNOSIS — E113412 Type 2 diabetes mellitus with severe nonproliferative diabetic retinopathy with macular edema, left eye: Secondary | ICD-10-CM

## 2021-08-17 DIAGNOSIS — I10 Essential (primary) hypertension: Secondary | ICD-10-CM | POA: Diagnosis not present

## 2021-08-17 DIAGNOSIS — E1129 Type 2 diabetes mellitus with other diabetic kidney complication: Secondary | ICD-10-CM

## 2021-08-17 MED ORDER — AMLODIPINE BESYLATE 10 MG PO TABS: 10.0000 mg | ORAL_TABLET | Freq: Every day | ORAL | 1 refills | Status: DC

## 2021-08-17 MED ORDER — METFORMIN HCL 500 MG PO TABS: 500.0000 mg | ORAL_TABLET | Freq: Two times a day (BID) | ORAL | 0 refills | Status: DC

## 2021-08-17 MED ORDER — LISINOPRIL-HYDROCHLOROTHIAZIDE 20-12.5 MG PO TABS: 2.0000 | ORAL_TABLET | Freq: Every day | ORAL | 1 refills | Status: DC

## 2021-08-17 NOTE — Assessment & Plan Note (Signed)
Patient is currently taking pantoprazole 40 mg daily.  Patient states his reflux is well controlled.

## 2021-08-17 NOTE — Assessment & Plan Note (Signed)
Patient is currently taking atorvastatin 40 mg daily.  Continue with current treatment plan.

## 2021-08-17 NOTE — Assessment & Plan Note (Signed)
Patient is currently taking metformin 500 mg twice daily and Farxiga 10 mg daily.  Continue with current treatment.  Patient is up-to-date on his eye exam.

## 2021-08-17 NOTE — Assessment & Plan Note (Signed)
She has a history of prostate cancer.  We will order PSA.  Patient is also to see urology.  Patient had to reschedule the appointment.  Encouraged patient to call and reschedule appointment with urology.

## 2021-08-17 NOTE — Progress Notes (Signed)
BP 126/72   Pulse 98   Temp 98.3 F (36.8 C) (Oral)   Resp 18   Ht '5\' 5"'$  (1.651 m)   Wt 171 lb 14.4 oz (78 kg)   SpO2 99%   BMI 28.61 kg/m    Subjective:    Patient ID: Cameron Glover, male    DOB: [birth year], 71 y.o.   MRN: [id number]  HPI: Cameron Glover is a 71 y.o. male  Chief Complaint  Patient presents with   Diabetes   Hyperlipidemia   Hypertension    6 month follow up   DM: His last A1c was 5.5 on 02/12/2021. He currently takes metformin 500 mg twice daily and farxiga 10 mg daily. He denies any polydipsia, polyuria or polyphasia.  He is due for his foot exam.  Patient is up-to-date on his eye exam.   HTN: His blood pressure today is 126/72.  He does not check his blood pressure at home.  He denies any chest pain, headaches, shortness of breath or blurred vision.  He is currently taking lisinopril-hydrochlorothiazide 20-12.5 mg two tabs daily and amlodipine 10 mg daily.    CKD: He sees Dr. Johnney Glover with [facility].  He says that he sees her every four months.  He says that she told him to take lisinopril-hydrochlorothiazide 20-12.5 mg 2 tabs daily and metformin 500 mg BID.  Last GFR was 43 on 02/12/2021.   Hyperlipidemia: His last LDL was 48 on 02/12/2021.  He currently takes atorvastatin 40 mg daily.  He denies any myalgia.   GERD: He currently takes pantoprazole 40 mg daily.  Patient states his acid reflux is well controlled.  History of prostate cancer: Threasa Alpha, PA received a letter from [facility] for follow-up cancer history.  Patient has a history of prostate cancer. Last seen by [name] was 08/01/2017.  Threasa Alpha, PA had already ordered PSA and placed referral for urology.  Patient had an appointment with urology but did not show up for the appointment.  Encouraged patient to call and reschedule appointment with urology.   Relevant past medical, surgical, family and social history reviewed and updated as  indicated. Interim medical history since our last visit reviewed. Allergies and medications reviewed and updated.  Review of Systems  Constitutional: Negative for fever or weight change.  Respiratory: Negative for cough and shortness of breath.   Cardiovascular: Negative for chest pain or palpitations.  Gastrointestinal: Negative for abdominal pain, no bowel changes.  Musculoskeletal: Negative for gait problem or joint swelling.  Skin: Negative for rash.  Neurological: Negative for dizziness or headache.  No other specific complaints in a complete review of systems (except as listed in HPI above).      Objective:    BP 126/72   Pulse 98   Temp 98.3 F (36.8 C) (Oral)   Resp 18   Ht '5\' 5"'$  (1.651 m)   Wt 171 lb 14.4 oz (78 kg)   SpO2 99%   BMI 28.61 kg/m   Wt Readings from Last 3 Encounters:  08/17/21 171 lb 14.4 oz (78 kg)  02/12/21 175 lb (79.4 kg)  08/11/20 176 lb 14.4 oz (80.2 kg)    Physical Exam  Constitutional: Patient appears well-developed and well-nourished. No distress.  HEENT: head atraumatic, normocephalic, pupils equal and reactive to light,  neck supple Cardiovascular: Normal rate, regular rhythm and normal heart sounds.  No murmur heard. No BLE edema. Pulmonary/Chest: Effort normal and breath sounds normal. No  respiratory distress. Abdominal: Soft.  There is no tenderness. Psychiatric: Patient has a normal mood and affect. behavior is normal. Judgment and thought content normal.  Diabetic Foot Exam - Simple   Simple Foot Form Diabetic Foot exam was performed with the following findings: Yes 08/17/2021  2:10 PM  Visual Inspection No deformities, no ulcerations, no other skin breakdown bilaterally: Yes Sensation Testing Intact to touch and monofilament testing bilaterally: Yes Pulse Check Posterior Tibialis and Dorsalis pulse intact bilaterally: Yes Comments     Results for orders placed or performed in visit on 04/29/21  HM DIABETES EYE EXAM  Result  Value Ref Range   HM Diabetic Eye Exam Retinopathy (A) No Retinopathy      Assessment & Plan:   Problem List Items Addressed This Visit       Cardiovascular and Mediastinum   Essential hypertension - Primary    She reports he is currently taking lisinopril-hydrochlorothiazide 20-12.5 mg 2 tabs daily and amlodipine 10 mg daily.  Blood pressure is at goal at 126/72.      Relevant Medications   lisinopril-hydrochlorothiazide (ZESTORETIC) 20-12.5 MG tablet   amLODipine (NORVASC) 10 MG tablet   Other Relevant Orders   CBC with Differential/Platelet   COMPLETE METABOLIC PANEL WITH GFR     Digestive   Gastroesophageal reflux disease    Patient is currently taking pantoprazole 40 mg daily.  Patient states his reflux is well controlled.        Endocrine   Hyperlipidemia associated with type 2 diabetes mellitus ([place])    Patient is currently taking atorvastatin 40 mg daily.  Continue with current treatment plan.      Relevant Medications   lisinopril-hydrochlorothiazide (ZESTORETIC) 20-12.5 MG tablet   metFORMIN (GLUCOPHAGE) 500 MG tablet   amLODipine (NORVASC) 10 MG tablet   Other Relevant Orders   Lipid panel   COMPLETE METABOLIC PANEL WITH GFR   CKD stage 3 due to type 2 diabetes mellitus [facility])    Patient reports Dr. Johnney Glover recommended that he take lisinopril-hydrochlorothiazide 20-12.5 mg 2 tabs daily and metformin 500 mg twice daily.  Dose has been changed reflect this in the chart.      Relevant Medications   lisinopril-hydrochlorothiazide (ZESTORETIC) 20-12.5 MG tablet   metFORMIN (GLUCOPHAGE) 500 MG tablet   Severe nonproliferative diabetic retinopathy of left eye, with macular edema, associated with type 2 diabetes mellitus ([place])    Patient is currently taking metformin 500 mg twice daily and Farxiga 10 mg daily.  Continue with current treatment.  Patient is up-to-date on his eye exam.      Relevant Medications   lisinopril-hydrochlorothiazide (ZESTORETIC) 20-12.5 MG  tablet   metFORMIN (GLUCOPHAGE) 500 MG tablet   Severe nonproliferative diabetic retinopathy of right eye, with macular edema, associated with type 2 diabetes mellitus ([place])    Patient is currently taking metformin 500 mg twice daily and Farxiga 10 mg daily.  Continue with current treatment.  Patient is up-to-date on his eye exam.      Relevant Medications   lisinopril-hydrochlorothiazide (ZESTORETIC) 20-12.5 MG tablet   metFORMIN (GLUCOPHAGE) 500 MG tablet   Controlled type 2 diabetes mellitus with microalbuminuria, without long-term current use of insulin ([place])    Patient is currently taking metformin 500 mg twice daily and Farxiga 10 mg daily.  Continue with current treatment.      Relevant Medications   lisinopril-hydrochlorothiazide (ZESTORETIC) 20-12.5 MG tablet   metFORMIN (GLUCOPHAGE) 500 MG tablet   Other Relevant Orders   Hemoglobin A1c  Other   History of prostate cancer    She has a history of prostate cancer.  We will order PSA.  Patient is also to see urology.  Patient had to reschedule the appointment.  Encouraged patient to call and reschedule appointment with urology.      Relevant Orders   PSA     Follow up plan: Return in about 6 months (around 02/17/2022) for follow up with Leisa .

## 2021-08-17 NOTE — Assessment & Plan Note (Signed)
She reports he is currently taking lisinopril-hydrochlorothiazide 20-12.5 mg 2 tabs daily and amlodipine 10 mg daily.  Blood pressure is at goal at 126/72.

## 2021-08-17 NOTE — Assessment & Plan Note (Signed)
Patient reports Dr. Johnney Ou recommended that he take lisinopril-hydrochlorothiazide 20-12.5 mg 2 tabs daily and metformin 500 mg twice daily.  Dose has been changed reflect this in the chart.

## 2021-08-17 NOTE — Assessment & Plan Note (Signed)
Patient is currently taking metformin 500 mg twice daily and Farxiga 10 mg daily.  Continue with current treatment.

## 2021-08-18 ENCOUNTER — Other Ambulatory Visit: Payer: Self-pay | Admitting: Nurse Practitioner

## 2021-08-18 DIAGNOSIS — Z8546 Personal history of malignant neoplasm of prostate: Secondary | ICD-10-CM

## 2021-08-18 DIAGNOSIS — R972 Elevated prostate specific antigen [PSA]: Secondary | ICD-10-CM

## 2021-08-18 LAB — COMPLETE METABOLIC PANEL WITH GFR
AG Ratio: 1.4 (calc) (ref 1.0–2.5)
ALT: 11 U/L (ref 9–46)
AST: 13 U/L (ref 10–35)
Albumin: 4.1 g/dL (ref 3.6–5.1)
Alkaline phosphatase (APISO): 85 U/L (ref 35–144)
BUN/Creatinine Ratio: 10 (calc) (ref 6–22)
BUN: 20 mg/dL (ref 7–25)
CO2: 25 mmol/L (ref 20–32)
Calcium: 7.9 mg/dL — ABNORMAL LOW (ref 8.6–10.3)
Chloride: 107 mmol/L (ref 98–110)
Creat: 2.09 mg/dL — ABNORMAL HIGH (ref 0.70–1.35)
Globulin: 3 g/dL (calc) (ref 1.9–3.7)
Glucose, Bld: 131 mg/dL — ABNORMAL HIGH (ref 65–99)
Potassium: 4.5 mmol/L (ref 3.5–5.3)
Sodium: 140 mmol/L (ref 135–146)
Total Bilirubin: 0.4 mg/dL (ref 0.2–1.2)
Total Protein: 7.1 g/dL (ref 6.1–8.1)
eGFR: 34 mL/min/{1.73_m2} — ABNORMAL LOW (ref >=60)

## 2021-08-18 LAB — CBC WITH DIFFERENTIAL/PLATELET
Absolute Monocytes: 413 cells/uL (ref 200–950)
Basophils Absolute: 39 cells/uL (ref 0–200)
Basophils Relative: 0.7 %
Eosinophils Absolute: 182 cells/uL (ref 15–500)
Eosinophils Relative: 3.3 %
HCT: 29.2 % — ABNORMAL LOW (ref 38.5–50.0)
Hemoglobin: 9.6 g/dL — ABNORMAL LOW (ref 13.2–17.1)
Lymphs Abs: 1419 cells/uL (ref 850–3900)
MCH: 31.2 pg (ref 27.0–33.0)
MCHC: 32.9 g/dL (ref 32.0–36.0)
MCV: 94.8 fL (ref 80.0–100.0)
MPV: 10 fL (ref 7.5–12.5)
Monocytes Relative: 7.5 %
Neutro Abs: 3449 cells/uL (ref 1500–7800)
Neutrophils Relative %: 62.7 %
Platelets: 293 10*3/uL (ref 140–400)
RBC: 3.08 10*6/uL — ABNORMAL LOW (ref 4.20–5.80)
RDW: 11.8 % (ref 11.0–15.0)
Total Lymphocyte: 25.8 %
WBC: 5.5 10*3/uL (ref 3.8–10.8)

## 2021-08-18 LAB — HEMOGLOBIN A1C
Hgb A1c MFr Bld: 5.2 % of total Hgb (ref <5.7)
Mean Plasma Glucose: 103 mg/dL
eAG (mmol/L): 5.7 mmol/L

## 2021-08-18 LAB — LIPID PANEL
Cholesterol: 83 mg/dL (ref <200)
HDL: 32 mg/dL — ABNORMAL LOW (ref >=40)
LDL Cholesterol (Calc): 34 mg/dL (calc)
Non-HDL Cholesterol (Calc): 51 mg/dL (calc) (ref <130)
Total CHOL/HDL Ratio: 2.6 (calc) (ref <5.0)
Triglycerides: 91 mg/dL (ref <150)

## 2021-08-18 LAB — PSA: PSA: 44.22 ng/mL — ABNORMAL HIGH (ref <=4.00)

## 2021-08-27 ENCOUNTER — Ambulatory Visit: Payer: Medicare Other | Admitting: Urology

## 2021-09-01 ENCOUNTER — Encounter (INDEPENDENT_AMBULATORY_CARE_PROVIDER_SITE_OTHER): Payer: Self-pay | Admitting: Ophthalmology

## 2021-09-01 ENCOUNTER — Ambulatory Visit (INDEPENDENT_AMBULATORY_CARE_PROVIDER_SITE_OTHER): Payer: Medicare Other | Admitting: Ophthalmology

## 2021-09-01 DIAGNOSIS — Z961 Presence of intraocular lens: Secondary | ICD-10-CM

## 2021-09-01 DIAGNOSIS — H2511 Age-related nuclear cataract, right eye: Secondary | ICD-10-CM

## 2021-09-01 DIAGNOSIS — E113412 Type 2 diabetes mellitus with severe nonproliferative diabetic retinopathy with macular edema, left eye: Secondary | ICD-10-CM | POA: Diagnosis not present

## 2021-09-01 DIAGNOSIS — H43823 Vitreomacular adhesion, bilateral: Secondary | ICD-10-CM | POA: Diagnosis not present

## 2021-09-01 MED ORDER — BEVACIZUMAB CHEMO INJECTION 1.25MG/0.05ML SYRINGE FOR KALEIDOSCOPE
1.2500 mg | INTRAVITREAL | Status: AC | PRN
  Administered 2021-09-01: 1.25 mg via INTRAVITREAL

## 2021-09-01 NOTE — Assessment & Plan Note (Signed)
Stable at this time 

## 2021-09-01 NOTE — Assessment & Plan Note (Signed)
Moderate progression

## 2021-09-01 NOTE — Progress Notes (Unsigned)
09/02/2021 3:01 PM   Cameron Glover 08/17/1950 [id number]  Referring provider: Bo Merino, [place],  [place] [address]  Chief Complaint  Patient presents with   Prostate Cancer    HPI: 71 year old male with personal history of prostate cancer who presents today to establish care.  He had a PSA drawn on 08/17/2021 which was noted to be markedly elevated of 44.2.  Mr. Hallowell is originally born in [place].  He moved to [place] over 30 years ago and lived and worked in [place], [place]  He moved back here in 2018 to be closer to his sister.  He reports that he believes that he was possibly diagnosed with prostate cancer in [place].  He did not have any treatment for this.  He recalls being told that it was "just a little bit" and did not need to be treated.  Specifically he denies surgery radiation or hormones.  He has not been under any surveillance protocols and not had a PSA since moving here until recently.  He gets up twice at night to urinate.  He denies any hematuria, sensation of incomplete bladder emptying or urgency frequency type symptoms.  His stream is good.  He does have occasional burning which is fairly rare and not consistent.  Never smoker, no history of chemical exposures.  He does mention today that he is 1 of 12 siblings.  None of them have since passed away most of which passed away from malignancies.  He is unsure what type of malignancies.  His parents also died when he was young of unknown causes to him.  PMH: Past Medical History:  Diagnosis Date   Acute renal failure (ARF) ([place])    AKI (acute kidney injury) ([place]) 09/02/2017   Anemia due to chronic kidney disease 04/15/2018   DM (diabetes mellitus) with complications ([place]) 2/40/9735   Elevated lipase 04/15/2018   Hyperglycemia without ketosis    Hyperlipidemia    Hypertension    Nuclear sclerotic cataract of left eye 05/14/2019    Surgical  History: Past Surgical History:  Procedure Laterality Date   COLONOSCOPY WITH PROPOFOL N/A 01/15/2019   Procedure: COLONOSCOPY WITH PROPOFOL;  Surgeon: Jonathon Bellows, MD;  Location: [facility] ENDOSCOPY;  Service: Gastroenterology;  Laterality: N/A;    Home Medications:  Allergies as of 09/02/2021   No Known Allergies      Medication List        Accurate as of September 02, 2021  3:01 PM. If you have any questions, ask your nurse or doctor.          amLODipine 10 MG tablet Commonly known as: NORVASC Take 1 tablet (10 mg total) by mouth daily.   atorvastatin 40 MG tablet Commonly known as: LIPITOR TAKE 1 TABLET BY MOUTH EVERY DAY   Farxiga 10 MG Tabs tablet Generic drug: dapagliflozin propanediol Take 10 mg by mouth daily.   lisinopril-hydrochlorothiazide 20-12.5 MG tablet Commonly known as: ZESTORETIC Take 2 tablets by mouth daily.   metFORMIN 500 MG tablet Commonly known as: GLUCOPHAGE Take 1 tablet (500 mg total) by mouth 2 (two) times daily with a meal. TAKE 1 TABLET BY MOUTH TWICE A DAY WITH A MEAL   pantoprazole 40 MG tablet Commonly known as: PROTONIX TAKE 1 TABLET BY MOUTH EVERY DAY        Allergies: No Known Allergies  Family History: Family History  Problem Relation Age of Onset   Kidney failure Sister  Cancer Sister    Diabetes Sister    Hypertension Sister    Hyperlipidemia Sister    Heart disease Sister    Kidney disease Sister    Cancer Brother    CAD Brother    Diabetes Brother    Hypertension Brother    Hyperlipidemia Brother    Heart disease Brother    Diabetes Other    Cancer Mother     Social History:  reports that he has never smoked. He has never used smokeless tobacco. He reports that he does not drink alcohol and does not use drugs.   Physical Exam: BP (!) 181/96   Pulse 99   Ht '5\' 5"'$  (1.651 m)   Wt 171 lb (77.6 kg)   BMI 28.46 kg/m   Constitutional:  Alert and oriented, No acute distress. HEENT: [place] AT, moist mucus  membranes.  Trachea midline, no masses. Cardiovascular: No clubbing, cyanosis, or edema. Respiratory: Normal respiratory effort, no increased work of breathing. GU: Rectal exam is grossly abnormal.  Normal sphincter tone but within the rectal vault, there is a large protuberant mass with significant mass effect in the rectum which is very large and nodular. Neurologic: Grossly intact, no focal deficits, moving all 4 extremities. Psychiatric: Normal mood and affect.  Laboratory Data: Lab Results  Component Value Date   WBC 5.5 08/17/2021   HGB 9.6 (L) 08/17/2021   HCT 29.2 (L) 08/17/2021   MCV 94.8 08/17/2021   PLT 293 08/17/2021    Lab Results  Component Value Date   CREATININE 2.09 (H) 08/17/2021    Lab Results  Component Value Date   PSA 44.22 (H) 08/17/2021    Lab Results  Component Value Date   HGBA1C 5.2 08/17/2021    Urinalysis UA today with 3-10 red blood cells per high-power field otherwise unremarkable    Assessment & Plan:    1. History of prostate cancer Presumed history of prostate cancer, does not recall the name of the practice or physician.  Suspect this likely was [facility] if he was residing in [place].  We did go ahead and sign a records release today and he will get back to [place] if he remembers or can track down where he was diagnosed.  Unfortunately, his PSA is markedly elevated today and he has a very abnormal rectal exam which is highly concerning for either local progression or even progression to metastatic disease.  I recommend in addition to obtaining records, a PSMA PET scan for the purpose of staging  He is agreeable this plan - Urinalysis, Complete - NM PET (PSMA) SKULL TO MID THIGH; Future  2. Elevated PSA As above - NM PET (PSMA) SKULL TO MID THIGH; Future  3. Microscopic hematuria Incidental microscopic hematuria today in the absence of infection  Given his age, he falls into the high risk range.  Unfortunately, he is unable to have  delayed imaging in the form of CT urogram secondary to chronic kidney disease.  As such, we will use the CT obtained in conjunction with his PET scan to evaluate his upper tracts grossly.  He is also agreeable to cystoscopy which was discussed in detail.  Follow up cystoscopy and PET scan results  Hollice Espy, MD  [facility] [address], [place], [place] [address] [phone]

## 2021-09-01 NOTE — Assessment & Plan Note (Signed)
Stable

## 2021-09-01 NOTE — Assessment & Plan Note (Signed)
Chronic active and at resistant now to Vabysmo and Eylea.  We will change to Avastin again which was more effective The nature of diabetic macular edema was discussed with the patient. Treatment options were outlined including medical therapy, laser & vitrectomy. The use of injectable medications reviewed, including Avastin, Lucentis, and Eylea. Periodic injections into the eye are likely to resolve diabetic macular edema (swelling in the center of vision). Initially, injections are delivered are delivered every 4-6 weeks, and the interval extended as the condition improves. On average, 8-9 injections the first year, and 5 in year 2. Improvement in the condition most often improves on medical therapy. Occasional use of focal laser is also recommended for residual macular edema (swelling). Excellent control of blood glucose and blood pressure are encouraged under the care of a primary physician or endocrinologist. Similarly, attempts to maintain serum cholesterol, low density lipoproteins, and high-density lipoproteins in a favorable range were recommended.

## 2021-09-01 NOTE — Progress Notes (Signed)
09/01/2021     CHIEF COMPLAINT Patient presents for  Chief Complaint  Patient presents with   Diabetic Retinopathy with Macular Edema      HISTORY OF PRESENT ILLNESS: Cameron Glover is a 71 y.o. male who presents to the clinic today for:   HPI   5 weeks for DILATE OS, AVASTIN OCT,, This is a change. Pt stated vision remained stable since last visit.  Last edited by Silvestre Moment on 09/01/2021  9:39 AM.      Referring physician: [name], PA-C [address] [place],  [place] [address]  HISTORICAL INFORMATION:   Selected notes from the MEDICAL RECORD NUMBER    Lab Results  Component Value Date   HGBA1C 5.2 08/17/2021     CURRENT MEDICATIONS: No current outpatient medications on file. (Ophthalmic Drugs)   No current facility-administered medications for this visit. (Ophthalmic Drugs)   Current Outpatient Medications (Other)  Medication Sig   amLODipine (NORVASC) 10 MG tablet Take 1 tablet (10 mg total) by mouth daily.   atorvastatin (LIPITOR) 40 MG tablet TAKE 1 TABLET BY MOUTH EVERY DAY   FARXIGA 10 MG TABS tablet Take 10 mg by mouth daily.   lisinopril-hydrochlorothiazide (ZESTORETIC) 20-12.5 MG tablet Take 2 tablets by mouth daily.   metFORMIN (GLUCOPHAGE) 500 MG tablet Take 1 tablet (500 mg total) by mouth 2 (two) times daily with a meal. TAKE 1 TABLET BY MOUTH TWICE A DAY WITH A MEAL   pantoprazole (PROTONIX) 40 MG tablet TAKE 1 TABLET BY MOUTH EVERY DAY   No current facility-administered medications for this visit. (Other)      REVIEW OF SYSTEMS: ROS   Negative for: Constitutional, Gastrointestinal, Neurological, Skin, Genitourinary, Musculoskeletal, HENT, Endocrine, Cardiovascular, Eyes, Respiratory, Psychiatric, Allergic/Imm, Heme/Lymph Last edited by Silvestre Moment on 09/01/2021  9:39 AM.       ALLERGIES No Known Allergies  PAST MEDICAL HISTORY Past Medical History:  Diagnosis Date   Acute renal failure (ARF) (HCC)    AKI (acute kidney  injury) ([place]) 09/02/2017   Anemia due to chronic kidney disease 04/15/2018   DM (diabetes mellitus) with complications ([place]) 1/91/4782   Elevated lipase 04/15/2018   Hyperglycemia without ketosis    Hyperlipidemia    Hypertension    Nuclear sclerotic cataract of left eye 05/14/2019   Past Surgical History:  Procedure Laterality Date   COLONOSCOPY WITH PROPOFOL N/A 01/15/2019   Procedure: COLONOSCOPY WITH PROPOFOL;  Surgeon: Jonathon Bellows, MD;  Location: [facility] ENDOSCOPY;  Service: Gastroenterology;  Laterality: N/A;    FAMILY HISTORY Family History  Problem Relation Age of Onset   Kidney failure Sister    Cancer Sister    Diabetes Sister    Hypertension Sister    Hyperlipidemia Sister    Heart disease Sister    Kidney disease Sister    Cancer Brother    CAD Brother    Diabetes Brother    Hypertension Brother    Hyperlipidemia Brother    Heart disease Brother    Diabetes Other    Cancer Mother     SOCIAL HISTORY Social History   Tobacco Use   Smoking status: Never   Smokeless tobacco: Never  Vaping Use   Vaping Use: Never used  Substance Use Topics   Alcohol use: Never   Drug use: Never         OPHTHALMIC EXAM:  Base Eye Exam     Visual Acuity (ETDRS)       Right Left  Dist [place] 20/20 20/50 -2   Dist ph [place]  20/30 -1         Tonometry (Tonopen, 9:43 AM)       Right Left   Pressure 9 8         Pupils       Pupils APD   Right PERRL None   Left PERRL None         Visual Fields       Left Right    Full Full         Extraocular Movement       Right Left    Full, Ortho Full, Ortho         Neuro/Psych     Oriented x3: Yes   Mood/Affect: Normal         Dilation     Left eye: 2.5% Phenylephrine, 1.0% Mydriacyl @ 9:43 AM           Slit Lamp and Fundus Exam     External Exam       Right Left   External Normal Normal         Slit Lamp Exam       Right Left   Lids/Lashes Normal Normal   Conjunctiva/Sclera White and  quiet White and quiet   Cornea Clear Clear   Anterior Chamber Deep and quiet Deep and quiet   Iris Round and reactive Round and reactive   Lens 2.5+ Nuclear sclerosis Centered posterior chamber intraocular lens   Anterior Vitreous Normal Normal         Fundus Exam       Right Left   Posterior Vitreous  Posterior vitreous detachment   Disc  Normal   C/D Ratio  0.5   Macula  Microaneurysms, , no clinically significant macular edema detectable clinically   Vessels  NPDR severe   Periphery  Normal            IMAGING AND PROCEDURES  Imaging and Procedures for 09/01/21  OCT, Retina - OU - Both Eyes       Right Eye Quality was good. Scan locations included subfoveal. Central Foveal Thickness: 318. Progression has improved. Findings include abnormal foveal contour, vitreomacular adhesion .   Left Eye Quality was good. Scan locations included subfoveal. Central Foveal Thickness: 491. Progression has worsened. Findings include abnormal foveal contour, vitreomacular adhesion .   Notes  OD  with focal CSME SUPERIOR to faz, post recetn focal laser, observe   OS now stabilized and active and center involved CSME, but persistent thickening despite Vabysmo.  OS vastly improved OS, distant Eylea recently,  repeat injection today with Avastin which has worked more effectively in the past       Intravitreal Injection, Pharmacologic Agent - OS - Left Eye       Time Out 09/01/2021. 10:41 AM. Confirmed correct patient, procedure, site, and patient consented.   Anesthesia Topical anesthesia was used. Anesthetic medications included Lidocaine 4%.   Procedure Preparation included 5% betadine to ocular surface, 10% betadine to eyelids, Ofloxacin . A 30 gauge needle was used.   Injection: 1.25 mg Bevacizumab 1.'25mg'$ /0.35m   Route: Intravitreal, Site: Left Eye   NDC: [id number] Lot:: [id number] Expiration date: 11/18/2021   Post-op Post injection exam found visual acuity of at  least counting fingers. The patient tolerated the procedure well. There were no complications. The patient received written and verbal post procedure care education. Post injection medications included ocuflox.  ASSESSMENT/PLAN:  Vitreomacular adhesion of both eyes Stable at this time  Nuclear sclerotic cataract of right eye Moderate progression  Pseudophakia of left eye Stable  Severe nonproliferative diabetic retinopathy of left eye, with macular edema, associated with type 2 diabetes mellitus ([place]) Chronic active and at resistant now to Vabysmo and Eylea.  We will change to Avastin again which was more effective The nature of diabetic macular edema was discussed with the patient. Treatment options were outlined including medical therapy, laser & vitrectomy. The use of injectable medications reviewed, including Avastin, Lucentis, and Eylea. Periodic injections into the eye are likely to resolve diabetic macular edema (swelling in the center of vision). Initially, injections are delivered are delivered every 4-6 weeks, and the interval extended as the condition improves. On average, 8-9 injections the first year, and 5 in year 2. Improvement in the condition most often improves on medical therapy. Occasional use of focal laser is also recommended for residual macular edema (swelling). Excellent control of blood glucose and blood pressure are encouraged under the care of a primary physician or endocrinologist. Similarly, attempts to maintain serum cholesterol, low density lipoproteins, and high-density lipoproteins in a favorable range were recommended.      ICD-10-CM   1. Severe nonproliferative diabetic retinopathy of left eye, with macular edema, associated with type 2 diabetes mellitus (HCC)  V40.9811 OCT, Retina - OU - Both Eyes    Intravitreal Injection, Pharmacologic Agent - OS - Left Eye    Bevacizumab (AVASTIN) SOLN 1.25 mg    2. Vitreomacular adhesion of both eyes   H43.823     3. Nuclear sclerotic cataract of right eye  H25.11     4. Pseudophakia of left eye  Z96.1       1.  OD nonscented involved CSME stabilized with focal laser photocoagulation continue to observe  2.  OS center involved CSME resistant to Eylea and Vabysmo recently.  Change to Avastin today.  3.  Ophthalmic Meds Ordered this visit:  Meds ordered this encounter  Medications   Bevacizumab (AVASTIN) SOLN 1.25 mg       Return in about 8 weeks (around 10/27/2021) for dilate, OS, AVASTIN OCT.  There are no Patient Instructions on file for this visit.   Explained the diagnoses, plan, and follow up with the patient and they expressed understanding.  Patient expressed understanding of the importance of proper follow up care.   Clent Demark Broderick Fonseca M.D. Diseases & Surgery of the Retina and Vitreous Retina & Diabetic [place] 09/01/21     Abbreviations: M myopia (nearsighted); A astigmatism; H hyperopia (farsighted); P presbyopia; Mrx spectacle prescription;  CTL contact lenses; OD right eye; OS left eye; OU both eyes  XT exotropia; ET esotropia; PEK punctate epithelial keratitis; PEE punctate epithelial erosions; DES dry eye syndrome; MGD meibomian gland dysfunction; ATs artificial tears; PFAT's preservative free artificial tears; [place] nuclear sclerotic cataract; PSC posterior subcapsular cataract; ERM epi-retinal membrane; PVD posterior vitreous detachment; RD retinal detachment; DM diabetes mellitus; DR diabetic retinopathy; NPDR non-proliferative diabetic retinopathy; PDR proliferative diabetic retinopathy; CSME clinically significant macular edema; DME diabetic macular edema; dbh dot blot hemorrhages; CWS cotton wool spot; POAG primary open angle glaucoma; C/D cup-to-disc ratio; HVF humphrey visual field; GVF goldmann visual field; OCT optical coherence tomography; IOP intraocular pressure; BRVO Branch retinal vein occlusion; CRVO central retinal vein occlusion; CRAO central  retinal artery occlusion; BRAO branch retinal artery occlusion; RT retinal tear; SB scleral buckle; PPV pars plana vitrectomy; VH Vitreous hemorrhage; PRP panretinal  laser photocoagulation; IVK intravitreal kenalog; VMT vitreomacular traction; MH Macular hole;  NVD neovascularization of the disc; NVE neovascularization elsewhere; AREDS age related eye disease study; ARMD age related macular degeneration; POAG primary open angle glaucoma; EBMD epithelial/anterior basement membrane dystrophy; ACIOL anterior chamber intraocular lens; IOL intraocular lens; PCIOL posterior chamber intraocular lens; Phaco/IOL phacoemulsification with intraocular lens placement; [place] photorefractive keratectomy; LASIK laser assisted in situ keratomileusis; HTN hypertension; DM diabetes mellitus; COPD chronic obstructive pulmonary disease

## 2021-09-02 ENCOUNTER — Encounter: Payer: Self-pay | Admitting: Urology

## 2021-09-02 ENCOUNTER — Ambulatory Visit: Payer: Medicare Other | Admitting: Urology

## 2021-09-02 VITALS — BP 181/96 | HR 99 | Ht 65.0 in | Wt 171.0 lb

## 2021-09-02 DIAGNOSIS — Z8546 Personal history of malignant neoplasm of prostate: Secondary | ICD-10-CM | POA: Diagnosis not present

## 2021-09-02 DIAGNOSIS — R972 Elevated prostate specific antigen [PSA]: Secondary | ICD-10-CM

## 2021-09-02 DIAGNOSIS — R3129 Other microscopic hematuria: Secondary | ICD-10-CM

## 2021-09-02 LAB — MICROSCOPIC EXAMINATION

## 2021-09-02 LAB — URINALYSIS, COMPLETE
Bilirubin, UA: NEGATIVE
Ketones, UA: NEGATIVE
Leukocytes,UA: NEGATIVE
Nitrite, UA: NEGATIVE
Specific Gravity, UA: 1.02 (ref 1.005–1.030)
Urobilinogen, Ur: 0.2 mg/dL (ref 0.2–1.0)
pH, UA: 6.5 (ref 5.0–7.5)

## 2021-09-15 ENCOUNTER — Telehealth: Payer: Self-pay | Admitting: Urology

## 2021-09-15 ENCOUNTER — Other Ambulatory Visit: Payer: Self-pay

## 2021-09-15 DIAGNOSIS — E1122 Type 2 diabetes mellitus with diabetic chronic kidney disease: Secondary | ICD-10-CM

## 2021-09-15 DIAGNOSIS — R3129 Other microscopic hematuria: Secondary | ICD-10-CM

## 2021-09-15 DIAGNOSIS — R972 Elevated prostate specific antigen [PSA]: Secondary | ICD-10-CM

## 2021-09-15 DIAGNOSIS — Z8546 Personal history of malignant neoplasm of prostate: Secondary | ICD-10-CM

## 2021-09-15 NOTE — Telephone Encounter (Signed)
Patient's insurance denied his PET scan  They are stating that he needs a Bone scan first. Spoke with Dr. Erlene Quan and she is going to order a CT and bone scan. Pet scan has been canceled for now. Patient is aware  Sharyn Lull

## 2021-09-16 ENCOUNTER — Ambulatory Visit
Admission: RE | Admit: 2021-09-16 | Discharge: 2021-09-16 | Disposition: A | Discharge status: Home / Self Care | Payer: Medicare Other | Source: Ambulatory Visit | Attending: Urology | Admitting: Urology

## 2021-09-16 ENCOUNTER — Ambulatory Visit: Payer: Medicare Other | Admitting: Urology

## 2021-09-16 ENCOUNTER — Encounter: Payer: Self-pay | Admitting: Urology

## 2021-09-16 VITALS — BP 162/91 | HR 91 | Ht 65.0 in | Wt 171.0 lb

## 2021-09-16 DIAGNOSIS — Z8546 Personal history of malignant neoplasm of prostate: Secondary | ICD-10-CM | POA: Diagnosis present

## 2021-09-16 DIAGNOSIS — R3129 Other microscopic hematuria: Secondary | ICD-10-CM | POA: Diagnosis present

## 2021-09-16 DIAGNOSIS — R972 Elevated prostate specific antigen [PSA]: Secondary | ICD-10-CM | POA: Insufficient documentation

## 2021-09-16 DIAGNOSIS — N183 Chronic kidney disease, stage 3 unspecified: Secondary | ICD-10-CM | POA: Diagnosis present

## 2021-09-16 DIAGNOSIS — E1122 Type 2 diabetes mellitus with diabetic chronic kidney disease: Secondary | ICD-10-CM | POA: Diagnosis present

## 2021-09-16 LAB — URINALYSIS, COMPLETE
Bilirubin, UA: NEGATIVE
Ketones, UA: NEGATIVE
Leukocytes,UA: NEGATIVE
Nitrite, UA: NEGATIVE
Specific Gravity, UA: 1.02 (ref 1.005–1.030)
Urobilinogen, Ur: 0.2 mg/dL (ref 0.2–1.0)
pH, UA: 6 (ref 5.0–7.5)

## 2021-09-16 LAB — MICROSCOPIC EXAMINATION: Bacteria, UA: NONE SEEN

## 2021-09-16 LAB — POCT I-STAT CREATININE: Creatinine, Ser: 2.2 mg/dL — ABNORMAL HIGH (ref 0.61–1.24)

## 2021-09-16 MED ORDER — IOHEXOL 300 MG/ML  SOLN
100.0000 mL | Freq: Once | INTRAMUSCULAR | Status: AC | PRN
  Administered 2021-09-16: 100 mL via INTRAVENOUS

## 2021-09-16 NOTE — Progress Notes (Signed)
   09/16/21  CC:  Chief Complaint  Patient presents with   Cysto    HPI: 71 year old male with a personal history of prostate cancer and markedly elevated PSA to 44.2 also found to have incidental microscopic hematuria who presents today for cystoscopy.  He was initially supposed to follow-up with [facility] PET scan but unfortunately this was denied by his insurance company.  As an alternative, CT urogram and bone scan were ordered.  Blood pressure (!) 162/91, pulse 91, height '5\' 5"'$  (1.651 m), weight 171 lb (77.6 kg). NED. A&Ox3.   No respiratory distress   Abd soft, NT, ND Normal phallus with bilateral descended testicles  Cystoscopy Procedure Note  Patient identification was confirmed, informed consent was obtained, and patient was prepped using Betadine solution.  Lidocaine jelly was administered per urethral meatus.     Pre-Procedure: - Inspection reveals a normal caliber ureteral meatus.  Procedure: The flexible cystoscope was introduced without difficulty - No urethral strictures/lesions are present. - Enlarged prostate bilobar coaptation - Elevated bladder neck - Bilateral ureteral orifices identified - Bladder mucosa  reveals no ulcers, tumors, or lesions - No bladder stones - No trabeculation  Retroflexion shows slight intravesical protrusion but no discrete median lobe, mild hypervascularity along the bladder neck   Post-Procedure: - Patient tolerated the procedure well  Assessment/ Plan:  1. History of prostate cancer/elevated PSA He was in active surveillance lost to follow-up with interval marked rise in his PSA  CT urogram for hematuria/prostate cancer as well as bone scan pending  We will have him follow-up and discuss treatment options based on these results - Urinalysis, Complete  2. Microscopic hematuria Cystoscopy today is unremarkable, CT urogram pending - Urinalysis, Complete    Return for MD follow up.  Hollice Espy, MD

## 2021-09-22 ENCOUNTER — Ambulatory Visit
Admission: RE | Admit: 2021-09-22 | Discharge: 2021-09-22 | Disposition: A | Discharge status: Home / Self Care | Payer: Medicare Other | Source: Ambulatory Visit | Attending: Urology | Admitting: Urology

## 2021-09-22 DIAGNOSIS — Z8546 Personal history of malignant neoplasm of prostate: Secondary | ICD-10-CM | POA: Insufficient documentation

## 2021-09-22 DIAGNOSIS — R972 Elevated prostate specific antigen [PSA]: Secondary | ICD-10-CM | POA: Insufficient documentation

## 2021-09-22 DIAGNOSIS — R3129 Other microscopic hematuria: Secondary | ICD-10-CM | POA: Diagnosis present

## 2021-09-22 MED ORDER — TECHNETIUM TC 99M MEDRONATE IV KIT
20.0000 | PACK | Freq: Once | INTRAVENOUS | Status: AC | PRN
  Administered 2021-09-22: 19.55 via INTRAVENOUS

## 2021-09-30 ENCOUNTER — Ambulatory Visit: Payer: Medicare Other | Admitting: Urology

## 2021-09-30 VITALS — BP 173/91 | HR 112 | Ht 64.0 in | Wt 172.0 lb

## 2021-09-30 DIAGNOSIS — R3129 Other microscopic hematuria: Secondary | ICD-10-CM

## 2021-09-30 DIAGNOSIS — C61 Malignant neoplasm of prostate: Secondary | ICD-10-CM

## 2021-09-30 NOTE — Progress Notes (Signed)
09/30/2021 7:55 AM   Cameron Glover [birth year] [id number]  Referring provider: [name], PA-C [address] [place],  [place] [address]  Chief Complaint  Patient presents with   Follow-up    HPI: 71 year old male with a personal history of prostate cancer and microscopic hematuria who returns today for follow-up of imaging.  He was diagnosed in [facility] with low risk prostate cancer before 2018.  He essentially was lost to follow-up and presented again to our practice with a PSA of 44.  In addition to this, he is noted to have microscopic hematuria.  In the interim, he underwent CT urogram which was unremarkable without GU pathology or evidence of metastatic disease.  He also had a cystoscopy which was unremarkable.  He also had a bone scan for staging for prostate cancer.  This was also negative for evidence of metastatic disease.  [facility] PET scan was denied by insurance.  He remains essentially asymptomatic.   PMH: Past Medical History:  Diagnosis Date   Acute renal failure (ARF) (Cameron Glover)    AKI (acute kidney injury) (Cameron Glover) 09/02/2017   Anemia due to chronic kidney disease 04/15/2018   DM (diabetes mellitus) with complications (Cameron Glover) 1/51/7616   Elevated lipase 04/15/2018   Hyperglycemia without ketosis    Hyperlipidemia    Hypertension    Nuclear sclerotic cataract of left eye 05/14/2019    Surgical History: Past Surgical History:  Procedure Laterality Date   COLONOSCOPY WITH PROPOFOL N/A 01/15/2019   Procedure: COLONOSCOPY WITH PROPOFOL;  Surgeon: Jonathon Bellows, MD;  Location: [facility] ENDOSCOPY;  Service: Gastroenterology;  Laterality: N/A;    Home Medications:  Allergies as of 09/30/2021   No Known Allergies      Medication List        Accurate as of September 30, 2021 11:59 PM. If you have any questions, ask your nurse or doctor.          amLODipine 10 MG tablet Commonly known as: NORVASC Take 1 tablet (10 mg total) by mouth  daily.   atorvastatin 40 MG tablet Commonly known as: LIPITOR TAKE 1 TABLET BY MOUTH EVERY DAY   Farxiga 10 MG Tabs tablet Generic drug: dapagliflozin propanediol Take 10 mg by mouth daily.   lisinopril-hydrochlorothiazide 20-12.5 MG tablet Commonly known as: ZESTORETIC Take 2 tablets by mouth daily.   metFORMIN 500 MG tablet Commonly known as: GLUCOPHAGE Take 1 tablet (500 mg total) by mouth 2 (two) times daily with a meal. TAKE 1 TABLET BY MOUTH TWICE A DAY WITH A MEAL   pantoprazole 40 MG tablet Commonly known as: PROTONIX TAKE 1 TABLET BY MOUTH EVERY DAY        Allergies: No Known Allergies  Family History: Family History  Problem Relation Age of Onset   Kidney failure Sister    Cancer Sister    Diabetes Sister    Hypertension Sister    Hyperlipidemia Sister    Heart disease Sister    Kidney disease Sister    Cancer Brother    CAD Brother    Diabetes Brother    Hypertension Brother    Hyperlipidemia Brother    Heart disease Brother    Diabetes Other    Cancer Mother     Social History:  reports that he has never smoked. He has never used smokeless tobacco. He reports that he does not drink alcohol and does not use drugs.   Physical Exam: BP (!) 173/91   Pulse (!) 112   Ht  $'5\' 4"'m$  (1.626 m)   Wt 172 lb (78 kg)   BMI 29.52 kg/m   Constitutional:  Alert and oriented, No acute distress. HEENT: [place] AT, moist mucus membranes.  Trachea midline, no masses. Neurologic: Grossly intact, no focal deficits, moving all 4 extremities. Psychiatric: Normal mood and affect.  Laboratory Data: Lab Results  Component Value Date   WBC 5.5 08/17/2021   HGB 9.6 (L) 08/17/2021   HCT 29.2 (L) 08/17/2021   MCV 94.8 08/17/2021   PLT 293 08/17/2021    Lab Results  Component Value Date   CREATININE 2.20 (H) 09/16/2021    Lab Results  Component Value Date   PSA 44.22 (H) 08/17/2021  No results found for: "TESTOSTERONE"  Lab Results  Component Value Date   HGBA1C  5.2 08/17/2021     Pertinent Imaging: CT HEMATURIA WORKUP  Narrative CLINICAL DATA:  Hematuria.  EXAM: CT ABDOMEN AND PELVIS WITHOUT AND WITH CONTRAST  TECHNIQUE: Multidetector CT imaging of the abdomen and pelvis was performed following the standard protocol before and following the bolus administration of intravenous contrast.  RADIATION DOSE REDUCTION: This exam was performed according to the departmental dose-optimization program which includes automated exposure control, adjustment of the mA and/or kV according to patient size and/or use of iterative reconstruction technique.  CONTRAST:  129m OMNIPAQUE IOHEXOL 300 MG/ML  SOLN  COMPARISON:  None Available.  FINDINGS: Lower chest: The lung bases are clear of acute process. No pleural effusion or pulmonary lesions. The heart is normal in size. No pericardial effusion. The distal esophagus and aorta are unremarkable.  Hepatobiliary: No hepatic lesions or intrahepatic biliary dilatation. The gallbladder is mildly contracted. No common bile duct dilatation.  Pancreas: No mass, inflammation or ductal dilatation.  Spleen: Normal size. No focal lesions.  Adrenals/Urinary Tract: The adrenal glands are normal.  No renal, ureteral or bladder calculi. Both kidneys demonstrate normal enhancement/perfusion following contrast administration. No renal lesions. The delayed images do not demonstrate any significant collecting system abnormalities. Both ureters are normal.  No bladder mass or asymmetric bladder wall thickening. There is a small amount of gas in the bladder possibly related to recent catheterization or cystoscopy. Recommend clinical correlation.  Stomach/Bowel: The stomach, duodenum, small bowel and colon are grossly normal. No acute inflammatory changes, mass lesions or obstructive findings. The terminal ileum and appendix are normal.  Vascular/Lymphatic: Minimal scattered atherosclerotic  calcifications involving the aorta and iliac arteries but no aneurysm or dissection. The major venous structures are unremarkable. No mesenteric or retroperitoneal mass or adenopathy.  Reproductive: Mild prostate gland enlargement. The seminal vesicles are.  Other: No pelvic mass or adenopathy. No free pelvic fluid collections. No inguinal mass or adenopathy. No abdominal wall hernia or subcutaneous lesions.  Musculoskeletal: No significant bony findings. No worrisome lytic or sclerotic bone lesions. 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  IMPRESSION: 1. No CT findings to account for the patient's hematuria. No renal, ureteral or bladder calculi or mass. 2. Small amount of gas in the bladder possibly related to recent catheterization or cystoscopy. 3. Mild prostate gland enlargement. 4. No abdominal/pelvic mass or adenopathy.   Electronically Signed By: PMarijo SanesM.D. On: 09/18/2021 10:09  Bone scan IMPRESSION: 1.  No findings specific for metastatic disease to bone.   2. Moderate to severe degenerative appearing bone activity in the lower lumbar spine, corresponding to recent lumbar CT appearance. Probable small focus of posttraumatic right lateral rib activity. Odontogenic appearing maxilla and mandible activity. And  typical degenerative activity in the bilateral appendicular skeleton.     Electronically Signed   By: Genevie Ann M.D.   On: 09/24/2021 14:46  Both of the above studies imaging studies were personally reviewed and agree with radiologic interpretation.   Assessment & Plan:    1. Prostate cancer [facility]) Formally diagnosed with low risk prostate cancer with marked rise in PSA now to 44 without evidence of metastatic disease on CT scan and bone scan  At this point, I do think that repeat prostate biopsy would be prudent and ordered to help guide management and treatment options.  Given that the prostate cancer appears to be  localized, options may include robotic prostatectomy with lymph node dissection versus radiation with hormones.  Briefly discussed both of these options.  We discussed prostate biopsy in detail including the procedure itself, the risks of blood in the urine, stool, and ejaculate, serious infection, and discomfort. He is willing to proceed with this as discussed.   2. Microscopic hematuria Work-up including CT urogram and cystoscopy is unremarkable   Return for prostate biopsy (after Oct 14th per patient).  Hollice Espy, MD  [facility] [address], [place], [place] [address] [phone]

## 2021-09-30 NOTE — Patient Instructions (Signed)

## 2021-10-20 ENCOUNTER — Other Ambulatory Visit: Payer: Medicare Other | Admitting: Urology

## 2021-10-25 ENCOUNTER — Other Ambulatory Visit: Payer: Self-pay | Admitting: Family Medicine

## 2021-10-25 DIAGNOSIS — E119 Type 2 diabetes mellitus without complications: Secondary | ICD-10-CM

## 2021-10-27 ENCOUNTER — Encounter (INDEPENDENT_AMBULATORY_CARE_PROVIDER_SITE_OTHER): Payer: Medicare Other | Admitting: Ophthalmology

## 2021-10-29 ENCOUNTER — Encounter: Payer: Self-pay | Admitting: Urology

## 2021-10-29 ENCOUNTER — Other Ambulatory Visit: Payer: Medicare Other | Admitting: Urology

## 2021-11-01 ENCOUNTER — Telehealth: Payer: Self-pay | Admitting: Urology

## 2021-11-01 NOTE — Telephone Encounter (Signed)
Mr. Skellenger did not show for his prostate biopsy/cystoscopy.  This is extremely proved.  Please stressed the importance of timely follow-up and follow-through and advised him to reschedule.  Hollice Espy, MD

## 2021-11-02 NOTE — Telephone Encounter (Signed)
Attempted to reach patient, no answer or voicemail.  Attempted to reach emergency contacts but number not in service.

## 2021-11-15 ENCOUNTER — Other Ambulatory Visit: Payer: Self-pay | Admitting: Nurse Practitioner

## 2021-11-15 DIAGNOSIS — E1129 Type 2 diabetes mellitus with other diabetic kidney complication: Secondary | ICD-10-CM

## 2021-11-16 NOTE — Telephone Encounter (Signed)
Requested Prescriptions  Pending Prescriptions Disp Refills   metFORMIN (GLUCOPHAGE) 500 MG tablet [Pharmacy Med Name: METFORMIN HCL 500 MG TABLET] 180 tablet 0    Sig: TAKE 1 TABLET BY MOUTH TWICE A DAY WITH A MEAL     Endocrinology:  Diabetes - Biguanides Failed - 11/15/2021 12:34 AM      Failed - Cr in normal range and within 360 days    Creat  Date Value Ref Range Status  08/17/2021 2.09 (H) 0.70 - 1.35 mg/dL Final   Creatinine, Ser  Date Value Ref Range Status  09/16/2021 2.20 (H) 0.61 - 1.24 mg/dL Final         Failed - eGFR in normal range and within 360 days    GFR, Est African American  Date Value Ref Range Status  02/11/2020 58 (L) > OR = 60 mL/min/1.89m Final   GFR, Est Non African American  Date Value Ref Range Status  02/11/2020 50 (L) > OR = 60 mL/min/1.769mFinal   eGFR  Date Value Ref Range Status  08/17/2021 34 (L) > OR = 60 mL/min/1.7367minal         Passed - HBA1C is between 0 and 7.9 and within 180 days    Hgb A1c MFr Bld  Date Value Ref Range Status  08/17/2021 5.2 <5.7 % of total Hgb Final    Comment:    For the purpose of screening for the presence of diabetes: . <5.7%       Consistent with the absence of diabetes 5.7-6.4%    Consistent with increased risk for diabetes             (prediabetes) > or =6.5%  Consistent with diabetes . This assay result is consistent with a decreased risk of diabetes. . Currently, no consensus exists regarding use of hemoglobin A1c for diagnosis of diabetes in children. . According to American Diabetes Association (ADA) guidelines, hemoglobin A1c <7.0% represents optimal control in non-pregnant diabetic patients. Different metrics may apply to specific patient populations.  Standards of Medical Care in Diabetes(ADA). .          Passed - B12 Level in normal range and within 720 days    Vitamin B-12  Date Value Ref Range Status  08/11/2020 250 200 - 1,100 pg/mL Final    Comment:    . Please Note:  Although the reference range for vitamin B12 is [phone] pg/mL, it has been reported that between 5 and 10% of patients with values between 200 and 400 pg/mL may experience neuropsychiatric and hematologic abnormalities due to occult B12 deficiency; less than 1% of patients with values above 400 pg/mL will have symptoms. .  Renella CunasValid encounter within last 6 months    Recent Outpatient Visits           3 months ago Essential hypertension   [facility]   7 months ago [facility]ential [facility]   9 months ago Controlled type 2 diabetes mellitus with microalbuminuria, without long-term current use of insulin [facility]   1 year ago Controlled type 2 diabetes mellitus with microalbuminuria, without long-term current use of insulin [facility]   1 year ago Anemia, unspecified type   [facility]  Future Appointments             In 3 months [name], PA-C [facility]            Passed - CBC within normal limits and completed in the last 12 months    WBC  Date Value Ref Range Status  08/17/2021 5.5 3.8 - 10.8 Thousand/uL Final   RBC  Date Value Ref Range Status  08/17/2021 3.08 (L) 4.20 - 5.80 Million/uL Final   Hemoglobin  Date Value Ref Range Status  08/17/2021 9.6 (L) 13.2 - 17.1 g/dL Final   HCT  Date Value Ref Range Status  08/17/2021 29.2 (L) 38.5 - 50.0 % Final   MCHC  Date Value Ref Range Status  08/17/2021 32.9 32.0 - 36.0 g/dL Final   [facility]  Date Value Ref Range Status  08/17/2021 31.2 27.0 - 33.0 pg Final   MCV  Date Value Ref Range Status  08/17/2021 94.8 80.0 - 100.0 fL Final   No results found for: "PLTCOUNTKUC", "LABPLAT", "POCPLA" RDW  Date Value Ref Range Status   08/17/2021 11.8 11.0 - 15.0 % Final

## 2021-11-19 ENCOUNTER — Ambulatory Visit: Payer: Medicare Other | Admitting: Urology

## 2021-11-19 VITALS — BP 150/97

## 2021-11-19 DIAGNOSIS — C61 Malignant neoplasm of prostate: Secondary | ICD-10-CM

## 2021-11-19 DIAGNOSIS — R972 Elevated prostate specific antigen [PSA]: Secondary | ICD-10-CM

## 2021-11-19 MED ORDER — CEFTRIAXONE SODIUM 1 G IJ SOLR
1.0000 g | Freq: Once | INTRAMUSCULAR | Status: AC
  Administered 2021-11-19: 1 g via INTRAMUSCULAR

## 2021-11-19 MED ORDER — LEVOFLOXACIN 500 MG PO TABS
500.0000 mg | ORAL_TABLET | Freq: Once | ORAL | Status: AC
  Administered 2021-11-19: 500 mg via ORAL

## 2021-11-19 NOTE — Progress Notes (Signed)
   11/19/21  CC:  Chief Complaint  Patient presents with   Prostate Biopsy    HPI: 71 year old male with a personal history of prostate cancer and rising PSA to 77 who presents today for prostate biopsy.  NED. A&Ox3.   No respiratory distress   Abd soft, NT, ND Normal sphincter tone  Prostate Biopsy Procedure   Informed consent was obtained after discussing risks/benefits of the procedure.  A time out was performed to ensure correct patient identity.  Pre-Procedure: - Last PSA Level:  Lab Results  Component Value Date   PSA 44.22 (H) 08/17/2021   -Ceftriaxone IM 1 g given prophylactically - Levaquin 500 mg administered PO -Transrectal Ultrasound performed revealing a 79.48 gm prostate -No significant hypoechoic or median lobe noted  Procedure: - Prostate block performed using 10 cc 1% lidocaine and biopsies taken from sextant areas, a total of 12 under ultrasound guidance.  Post-Procedure: - Patient tolerated the procedure well - He was counseled to seek immediate medical attention if experiences any severe pain, significant bleeding, or fevers - Return in one week to discuss biopsy results   Hollice Espy, MD

## 2021-11-20 LAB — SURGICAL PATHOLOGY

## 2021-11-25 ENCOUNTER — Ambulatory Visit: Payer: Medicare Other | Admitting: Urology

## 2021-11-25 NOTE — Progress Notes (Incomplete)
11/25/2021 9:57 AM   Cameron Glover 07/06/1950 [id number]  Referring provider: [name], PA-C [address] [place],  [place] [address]  No chief complaint on file.   HPI:    PMH: Past Medical History:  Diagnosis Date   Acute renal failure (ARF) ([place])    AKI (acute kidney injury) ([place]) 09/02/2017   Anemia due to chronic kidney disease 04/15/2018   DM (diabetes mellitus) with complications ([place]) 9/62/9528   Elevated lipase 04/15/2018   Hyperglycemia without ketosis    Hyperlipidemia    Hypertension    Nuclear sclerotic cataract of left eye 05/14/2019    Surgical History: Past Surgical History:  Procedure Laterality Date   COLONOSCOPY WITH PROPOFOL N/A 01/15/2019   Procedure: COLONOSCOPY WITH PROPOFOL;  Surgeon: Jonathon Bellows, MD;  [facility] END[facility]COPY;  Service: Gastroenterology;  Laterality: N/A;    Home Medications:  Allergies as of 11/25/2021   No Known Allergies      Medication List        Accurate as of November 25, 2021  9:57 AM. If you have any questions, ask your nurse or doctor.          amLODipine 10 MG tablet Commonly known as: NORVASC Take 1 tablet (10 mg total) by mouth daily.   atorvastatin 40 MG tablet Commonly known as: LIPITOR TAKE 1 TABLET BY MOUTH EVERY DAY   Farxiga 10 MG Tabs tablet Generic drug: dapagliflozin propanediol Take 10 mg by mouth daily.   lisinopril-hydrochlorothiazide 20-12.5 MG tablet Commonly known as: ZESTORETIC Take 2 tablets by mouth daily.   metFORMIN 500 MG tablet Commonly known as: GLUCOPHAGE TAKE 1 TABLET BY MOUTH TWICE A DAY WITH A MEAL   pantoprazole 40 MG tablet Commonly known as: PROTONIX TAKE 1 TABLET BY MOUTH EVERY DAY        Allergies: No Known Allergies  Family History: Family History  Problem Relation Age of Onset   Kidney failure Sister    Cancer Sister    Diabetes Sister    Hypertension Sister    Hyperlipidemia Sister    Heart disease Sister    Kidney disease  Sister    Cancer Brother    CAD Brother    Diabetes Brother    Hypertension Brother    Hyperlipidemia Brother    Heart disease Brother    Diabetes Other    Cancer Mother     Social History:  reports that he has never smoked. He has never used smokeless tobacco. He reports that he does not drink alcohol and does not use drugs.   Physical Exam: There were no vitals taken for this visit.  Constitutional:  Alert and oriented, No acute distress. HEENT: [place] AT, moist mucus membranes.  Trachea midline, no masses. Cardiovascular: No clubbing, cyanosis, or edema. Respiratory: Normal respiratory effort, no increased work of breathing. GI: Abdomen is soft, nontender, nondistended, no abdominal masses GU: No CVA tenderness Skin: No rashes, bruises or suspicious lesions. Neurologic: Grossly intact, no focal deficits, moving all 4 extremities. Psychiatric: Normal mood and affect.  Laboratory Data: Lab Results  Component Value Date   WBC 5.5 08/17/2021   HGB 9.6 (L) 08/17/2021   HCT 29.2 (L) 08/17/2021   MCV 94.8 08/17/2021   PLT 293 08/17/2021    Lab Results  Component Value Date   CREATININE 2.20 (H) 09/16/2021    Lab Results  Component Value Date   PSA 44.22 (H) 08/17/2021    No results found for: "TESTOSTERONE"  Lab Results  Component Value Date   HGBA1C 5.2 08/17/2021    Urinalysis    Component Value Date/Time   COLORURINE YELLOW 09/02/2017 1924   APPEARANCEUR Clear 09/16/2021 1036   LABSPEC 1.026 09/02/2017 1924   PHURINE 6.0 09/02/2017 1924   GLUCOSEU 3+ (A) 09/16/2021 1036   HGBUR NEGATIVE 09/02/2017 1924   BILIRUBINUR Negative 09/16/2021 1036   KETONESUR 5 (A) 09/02/2017 1924   PROTEINUR 3+ (A) 09/16/2021 1036   PROTEINUR NEGATIVE 09/02/2017 1924   UROBILINOGEN 0.2 02/13/2019 0814   NITRITE Negative 09/16/2021 1036   NITRITE NEGATIVE 09/02/2017 1924   LEUKOCYTESUR Negative 09/16/2021 1036    Lab Results  Component Value Date   LABMICR See below:  09/16/2021   WBCUA 0-5 09/16/2021   LABEPIT 0-10 09/16/2021   BACTERIA None seen 09/16/2021    Pertinent Imaging: *** No results found for this or any previous visit.  No results found for this or any previous visit.  No results found for this or any previous visit.  No results found for this or any previous visit.  Results for orders placed during the hospital encounter of 09/02/17  US RENAL  Narrative CLINICAL DATA:  Patient with acute renal failure.  EXAM: RENAL / URINARY TRACT ULTRASOUND COMPLETE  COMPARISON:  None.  FINDINGS: Right Kidney:  Length: 9.9 cm. Echogenicity within normal limits. No mass or hydronephrosis visualized.  Left Kidney:  Length: 11.5 cm. Echogenicity within normal limits. No mass or hydronephrosis visualized.  Bladder:  Mild wall thickening of the urinary bladder.  Prostate is enlarged.  Echogenic hepatic parenchyma.  IMPRESSION: No hydronephrosis.  Mild wall thickening of the urinary bladder may be secondary to outlet obstruction. Recommend correlation with urinalysis to exclude infection.  Hepatic steatosis.   Electronically Signed By: Lovey Newcomer M.D. On: 09/03/2017 01:59  No valid procedures specified. Results for orders placed during the hospital encounter of 09/16/21  CT HEMATURIA WORKUP  Narrative CLINICAL DATA:  Hematuria.  EXAM: CT ABDOMEN AND PELVIS WITHOUT AND WITH CONTRAST  TECHNIQUE: Multidetector CT imaging of the abdomen and pelvis was performed following the standard protocol before and following the bolus administration of intravenous contrast.  RADIATION DOSE REDUCTION: This exam was performed according to the departmental dose-optimization program which includes automated exposure control, adjustment of the mA and/or kV according to patient size and/or use of iterative reconstruction technique.  CONTRAST:  161m OMNIPAQUE IOHEXOL 300 MG/ML  SOLN  COMPARISON:  None  Available.  FINDINGS: Lower chest: The lung bases are clear of acute process. No pleural effusion or pulmonary lesions. The heart is normal in size. No pericardial effusion. The distal esophagus and aorta are unremarkable.  Hepatobiliary: No hepatic lesions or intrahepatic biliary dilatation. The gallbladder is mildly contracted. No common bile duct dilatation.  Pancreas: No mass, inflammation or ductal dilatation.  Spleen: Normal size. No focal lesions.  Adrenals/Urinary Tract: The adrenal glands are normal.  No renal, ureteral or bladder calculi. Both kidneys demonstrate normal enhancement/perfusion following contrast administration. No renal lesions. The delayed images do not demonstrate any significant collecting system abnormalities. Both ureters are normal.  No bladder mass or asymmetric bladder wall thickening. There is a small amount of gas in the bladder possibly related to recent catheterization or cystoscopy. Recommend clinical correlation.  Stomach/Bowel: The stomach, duodenum, small bowel and colon are grossly normal. No acute inflammatory changes, mass lesions or obstructive findings. The terminal ileum and appendix are normal.  Vascular/Lymphatic: Minimal scattered atherosclerotic calcifications involving the aorta and iliac arteries but no  aneurysm or dissection. The major venous structures are unremarkable. No mesenteric or retroperitoneal mass or adenopathy.  Reproductive: Mild prostate gland enlargement. The seminal vesicles are.  Other: No pelvic mass or adenopathy. No free pelvic fluid collections. No inguinal mass or adenopathy. No abdominal wall hernia or subcutaneous lesions.  Musculoskeletal: No significant bony findings. No worrisome lytic or sclerotic bone lesions. 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  IMPRESSION: 1. No CT findings to account for the patient's hematuria. No renal, ureteral or bladder calculi or  mass. 2. Small amount of gas in the bladder possibly related to recent catheterization or cystoscopy. 3. Mild prostate gland enlargement. 4. No abdominal/pelvic mass or adenopathy.   Electronically Signed By: Marijo Sanes M.D. On: 09/18/2021 10:09  No results found for this or any previous visit.   Assessment & Plan:    There are no diagnoses linked to this encounter.  No follow-ups on file.  Cameron Espy, MD  [facility] [address] [place], [place] [address] [phone]

## 2021-12-08 ENCOUNTER — Ambulatory Visit: Payer: Medicare Other | Admitting: Urology

## 2021-12-15 ENCOUNTER — Ambulatory Visit: Payer: Medicare Other | Admitting: Urology

## 2021-12-15 ENCOUNTER — Ambulatory Visit (INDEPENDENT_AMBULATORY_CARE_PROVIDER_SITE_OTHER): Payer: Medicare Other | Admitting: Urology

## 2021-12-15 ENCOUNTER — Encounter: Payer: Self-pay | Admitting: Urology

## 2021-12-15 VITALS — Ht 64.0 in | Wt 172.0 lb

## 2021-12-15 DIAGNOSIS — C61 Malignant neoplasm of prostate: Secondary | ICD-10-CM

## 2021-12-15 NOTE — Progress Notes (Signed)
12/15/2021 9:54 AM   Cameron Glover 1950/04/15 [id number]  Referring provider: [name], PA-C [address] [place],  [place] [address]  Chief Complaint  Patient presents with   Results    HPI: 71 year old male who presents today for follow-up prostate biopsy results.  He was diagnosed in [facility] with low risk prostate cancer before 2018.  He essentially was lost to follow-up and presented again to our practice with a PSA of 44.   In addition to this, he is noted to have microscopic hematuria.  In the interim, he underwent CT urogram which was unremarkable without GU pathology or evidence of metastatic disease.  He also had a cystoscopy which was unremarkable.   He also had a bone scan for staging for prostate cancer.  This was also negative for evidence of metastatic disease.  PSMA PET scan was denied by insurance.  TRUS volume 79 g.     IPSS     Row Name 12/15/21 0900         International Prostate Symptom Score   How often have you had the sensation of not emptying your bladder? Not at All     How often have you had to urinate less than every two hours? Less than half the time     How often have you found you stopped and started again several times when you urinated? Less than half the time     How often have you found it difficult to postpone urination? Less than half the time     How often have you had a weak urinary stream? Not at All     How often have you had to strain to start urination? Less than half the time     How many times did you typically get up at night to urinate? None     Total IPSS Score 8       Quality of Life due to urinary symptoms   If you were to spend the rest of your life with your urinary condition just the way it is now how would you feel about that? Mixed              Score:  1-7 Mild 8-19 Moderate 20-35 Severe   SHIM     Row Name 12/15/21 0915         SHIM: Over the last 6 months:   How  do you rate your confidence that you could get and keep an erection? High     When you had erections with sexual stimulation, how often were your erections hard enough for penetration (entering your partner)? Sometimes (about half the time)     During sexual intercourse, how often were you able to maintain your erection after you had penetrated (entered) your partner? Sometimes (about half the time)     During sexual intercourse, how difficult was it to maintain your erection to completion of intercourse? Slightly Difficult     When you attempted sexual intercourse, how often was it satisfactory for you? Most Times (much more than half the time)       SHIM Total Score   SHIM 18               PMH: Past Medical History:  Diagnosis Date   Acute renal failure (ARF) ([place])    AKI (acute kidney injury) ([place]) 09/02/2017   Anemia due to chronic kidney disease 04/15/2018   DM (diabetes mellitus) with complications ([place]) 2/75/1700  Elevated lipase 04/15/2018   Hyperglycemia without ketosis    Hyperlipidemia    Hypertension    Nuclear sclerotic cataract of left eye 05/14/2019    Surgical History: Past Surgical History:  Procedure Laterality Date   COLONOSCOPY WITH PROPOFOL N/A 01/15/2019   Procedure: COLONOSCOPY WITH PROPOFOL;  Surgeon: Jonathon Bellows, MD;  Location[facility] ENDOSCOPY;  Service: Gastroenterology;  Laterality: N/A;    Home Medications:  Allergies as of 12/15/2021   No Known Allergies      Medication List        Accurate as of December 15, 2021  9:54 AM. If you have any questions, ask your nurse or doctor.          amLODipine 10 MG tablet Commonly known as: NORVASC Take 1 tablet (10 mg total) by mouth daily.   atorvastatin 40 MG tablet Commonly known as: LIPITOR TAKE 1 TABLET BY MOUTH EVERY DAY   Farxiga 10 MG Tabs tablet Generic drug: dapagliflozin propanediol Take 10 mg by mouth daily.   lisinopril-hydrochlorothiazide 20-12.5 MG tablet Commonly known as:  ZESTORETIC Take 2 tablets by mouth daily.   metFORMIN 500 MG tablet Commonly known as: GLUCOPHAGE TAKE 1 TABLET BY MOUTH TWICE A DAY WITH A MEAL   pantoprazole 40 MG tablet Commonly known as: PROTONIX TAKE 1 TABLET BY MOUTH EVERY DAY        Allergies: No Known Allergies  Family History: Family History  Problem Relation Age of Onset   Kidney failure Sister    Cancer Sister    Diabetes Sister    Hypertension Sister    Hyperlipidemia Sister    Heart disease Sister    Kidney disease Sister    Cancer Brother    CAD Brother    Diabetes Brother    Hypertension Brother    Hyperlipidemia Brother    Heart disease Brother    Diabetes Other    Cancer Mother     Social History:  reports that he has never smoked. He has never used smokeless tobacco. He reports that he does not drink alcohol and does not use drugs.   Physical Exam: Ht '5\' 4"'$  (1.626 m)   Wt 172 lb (78 kg)   BMI 29.52 kg/m   Constitutional:  Alert and oriented, No acute distress. HEENT: [place] AT, moist mucus membranes.  Trachea midline, no masses. Neurologic: Grossly intact, no focal deficits, moving all 4 extremities. Psychiatric: Normal mood and affect.  Laboratory Data: Lab Results  Component Value Date   WBC 5.5 08/17/2021   HGB 9.6 (L) 08/17/2021   HCT 29.2 (L) 08/17/2021   MCV 94.8 08/17/2021   PLT 293 08/17/2021    Lab Results  Component Value Date   CREATININE 2.20 (H) 09/16/2021    Lab Results  Component Value Date   PSA 44.22 (H) 08/17/2021   Lab Results  Component Value Date   HGBA1C 5.2 08/17/2021   Pertinent Imaging:  CT HEMATURIA WORKUP  Narrative CLINICAL DATA:  Hematuria.  EXAM: CT ABDOMEN AND PELVIS WITHOUT AND WITH CONTRAST  TECHNIQUE: Multidetector CT imaging of the abdomen and pelvis was performed following the standard protocol before and following the bolus administration of intravenous contrast.  RADIATION DOSE REDUCTION: This exam was performed according to  the departmental dose-optimization program which includes automated exposure control, adjustment of the mA and/or kV according to patient size and/or use of iterative reconstruction technique.  CONTRAST:  136m OMNIPAQUE IOHEXOL 300 MG/ML  SOLN  COMPARISON:  None Available.  FINDINGS: Lower  chest: The lung bases are clear of acute process. No pleural effusion or pulmonary lesions. The heart is normal in size. No pericardial effusion. The distal esophagus and aorta are unremarkable.  Hepatobiliary: No hepatic lesions or intrahepatic biliary dilatation. The gallbladder is mildly contracted. No common bile duct dilatation.  Pancreas: No mass, inflammation or ductal dilatation.  Spleen: Normal size. No focal lesions.  Adrenals/Urinary Tract: The adrenal glands are normal.  No renal, ureteral or bladder calculi. Both kidneys demonstrate normal enhancement/perfusion following contrast administration. No renal lesions. The delayed images do not demonstrate any significant collecting system abnormalities. Both ureters are normal.  No bladder mass or asymmetric bladder wall thickening. There is a small amount of gas in the bladder possibly related to recent catheterization or cystoscopy. Recommend clinical correlation.  Stomach/Bowel: The stomach, duodenum, small bowel and colon are grossly normal. No acute inflammatory changes, mass lesions or obstructive findings. The terminal ileum and appendix are normal.  Vascular/Lymphatic: Minimal scattered atherosclerotic calcifications involving the aorta and iliac arteries but no aneurysm or dissection. The major venous structures are unremarkable. No mesenteric or retroperitoneal mass or adenopathy.  Reproductive: Mild prostate gland enlargement. The seminal vesicles are.  Other: No pelvic mass or adenopathy. No free pelvic fluid collections. No inguinal mass or adenopathy. No abdominal wall hernia or subcutaneous  lesions.  Musculoskeletal: No significant bony findings. No worrisome lytic or sclerotic bone lesions. 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  IMPRESSION: 1. No CT findings to account for the patient's hematuria. No renal, ureteral or bladder calculi or mass. 2. Small amount of gas in the bladder possibly related to recent catheterization or cystoscopy. 3. Mild prostate gland enlargement. 4. No abdominal/pelvic mass or adenopathy.   Electronically Signed By: Marijo Sanes M.D. On: 09/18/2021 10:09   Assessment & Plan:    1. Prostate cancer ([place]) Upstaging to technically high risk based on PSA, high-volume Gleason 3+4 with PSA of 44  He is no longer a good candidate for active surveillance, strongly recommend intervention for localized prostate cancer.  The patient was counseled about the natural history of prostate cancer and the standard treatment options that are available for prostate cancer. It was explained to him how his age and life expectancy, clinical stage, Gleason score, and PSA affect his prognosis, the decision to proceed with additional staging studies, as well as how that information influences recommended treatment strategies. We discussed the roles for active surveillance, radiation therapy, surgical therapy, androgen deprivation, as well as ablative therapy options for the treatment of prostate cancer as appropriate to his individual cancer situation. We discussed the risks and benefits of these options with regard to their impact on cancer control and also in terms of potential adverse events, complications, and impact on quality of life particularly related to urinary, bowel, and sexual function. The patient was encouraged to ask questions throughout the discussion today and all questions were answered to his stated satisfaction. In addition, the patient was provided with and/or directed to appropriate resources and literature for further  education about prostate cancer treatment options.  After discussing various treatment options including radiation with ADT versus surgery, he is not keen on surgery.  He reports that his mother had surgery for cancer and had many complications.  He is leaning towards radiation.  We did go ahead and discussed ADT today and side effects of this including hot flashes, sexual side effects including libido changes, loss of muscle mass, central weight gain, long-term cardiac  issues.  Duration of ADT technically should be 2 to 3 years based on his PSA technically falls into the high risk range.  He was provided with literature of the above today.  Will have Dr. Olena Leatherwood office reach out to [place] once patient is made a decision on how to move forward.   - Ambulatory referral to Radiation Oncology   Return for will follow up after Rad/Onc consult.  Cameron Espy, MD  [facility] [address], [place], [place] [address] [phone]  I spent 45 total minutes on the day of the encounter including pre-visit review of the medical record, face-to-face time with the patient, and post visit ordering of labs/imaging/tests.

## 2021-12-15 NOTE — Patient Instructions (Signed)
Leuprolide Solution for Injection What is this medication? LEUPROLIDE (loo PROE lide) reduces the symptoms of prostate cancer. It works by decreasing levels of the hormone testosterone in the body. This prevents prostate cancer cells from spreading or growing. This medicine may be used for other purposes; ask your health care provider or pharmacist if you have questions. COMMON BRAND NAME(S): Lupron What should I tell my care team before I take this medication? They need to know if you have any of these conditions: Diabetes Heart attack Heart disease High blood pressure High cholesterol Pain or difficulty passing urine Spinal cord metastasis Stroke Tobacco use An unusual or allergic reaction to leuprolide, other medications, foods, dyes, or preservatives Pregnant or trying to get pregnant Breast-feeding How should I use this medication? This medication is for injection under the skin or into a muscle. You will be taught how to prepare and give this medication. Use exactly as directed. Take your medication at regular intervals. Do not take it more often than directed. It is important that you put your used needles and syringes in a special sharps container. Do not put them in a trash can. If you do not have a sharps container, call your care team to get one. A special MedGuide will be given to you by the pharmacist with each prescription and refill. Be sure to read this information carefully each time. Talk to your care team about the use of this medication in children. While this medication may be prescribed for children as young as 8 years for selected conditions, precautions do apply. Overdosage: If you think you have taken too much of this medicine contact a poison control center or emergency room at once. NOTE: This medicine is only for you. Do not share this medicine with others. What if I miss a dose? If you miss a dose, take it as soon as you can. If it is almost time for your next  dose, take only that dose. Do not take double or extra doses. What may interact with this medication? Do not take this medication with any of the following: Chasteberry Cisapride Dronedarone Pimozide Thioridazine This medication may also interact with the following: Estrogen or progestin hormones Herbal or dietary supplements, like black cohosh or DHEA Other medications that cause heart rhythm changes Testosterone This list may not describe all possible interactions. Give your health care provider a list of all the medicines, herbs, non-prescription drugs, or dietary supplements you use. Also tell them if you smoke, drink alcohol, or use illegal drugs. Some items may interact with your medicine. What should I watch for while using this medication? Visit your care team for regular checks on your progress. During the first week, your symptoms may get worse, but then will improve as you continue your treatment. You may get hot flashes, increased bone pain, increased difficulty passing urine, or an aggravation of nerve symptoms. Discuss these effects with your care team, some of them may improve with continued use of this medication. Patients may experience a menstrual cycle or spotting during the first 2 months of therapy with this medication. If this continues, contact your care team. This medication may increase blood sugar. The risk may be higher in patients who already have diabetes. Ask your care team what you can do to lower your risk of diabetes while taking this medication. What side effects may I notice from receiving this medication? Side effects that you should report to your care team as soon as possible: Allergic reactions--skin rash,  itching, hives, swelling of the face, lips, tongue, or throat Heart attack--pain or tightness in the chest, shoulders, arms, or jaw, nausea, shortness of breath, cold or clammy skin, feeling faint or lightheaded Heart rhythm changes--fast or irregular  heartbeat, dizziness, feeling faint or lightheaded, chest pain, trouble breathing High blood sugar (hyperglycemia)--increased thirst or amount of urine, unusual weakness or fatigue, blurry vision Mood swings, irritability, hostility Seizures Stroke--sudden numbness or weakness of the face, arm, or leg, trouble speaking, confusion, trouble walking, loss of balance or coordination, dizziness, severe headache, change in vision Thoughts of suicide or self-harm, worsening mood, feelings of depression Side effects that usually do not require medical attention (report to your care team if they continue or are bothersome): Bone pain Change in sex drive or performance General discomfort and fatigue Hot flashes Muscle pain Pain, redness, or irritation at injection site Swelling of the ankles, hands, or feet This list may not describe all possible side effects. Call your doctor for medical advice about side effects. You may report side effects to FDA at 1-800-FDA-1088. Where should I keep my medication? Keep out of the reach of children and pets. Store below 25 degrees C (77 degrees F). Do not freeze. Protect from light. Get rid of any unused medication after the expiration date. To get rid of medications that are no longer needed or have expired: Take the medication to a medication take-back program. Check with your pharmacy or law enforcement to find a location. If you cannot return the medication, ask your pharmacist or care team how to get rid of this medication safely. NOTE: This sheet is a summary. It may not cover all possible information. If you have questions about this medicine, talk to your doctor, pharmacist, or health care provider.  2023 Elsevier/Gold Standard (2020-11-21 00:00:00)

## 2021-12-23 ENCOUNTER — Encounter: Payer: Self-pay | Admitting: Radiation Oncology

## 2021-12-23 ENCOUNTER — Ambulatory Visit
Admission: RE | Admit: 2021-12-23 | Discharge: 2021-12-23 | Disposition: A | Discharge status: Home / Self Care | Payer: Medicare Other | Source: Ambulatory Visit | Attending: Radiation Oncology | Admitting: Radiation Oncology

## 2021-12-23 VITALS — BP 150/94 | HR 94 | Temp 97.3°F | Resp 16 | Ht 64.0 in | Wt 172.0 lb

## 2021-12-23 DIAGNOSIS — N189 Chronic kidney disease, unspecified: Secondary | ICD-10-CM | POA: Insufficient documentation

## 2021-12-23 DIAGNOSIS — R3129 Other microscopic hematuria: Secondary | ICD-10-CM | POA: Insufficient documentation

## 2021-12-23 DIAGNOSIS — C61 Malignant neoplasm of prostate: Secondary | ICD-10-CM | POA: Insufficient documentation

## 2021-12-23 DIAGNOSIS — I129 Hypertensive chronic kidney disease with stage 1 through stage 4 chronic kidney disease, or unspecified chronic kidney disease: Secondary | ICD-10-CM | POA: Insufficient documentation

## 2021-12-23 DIAGNOSIS — E785 Hyperlipidemia, unspecified: Secondary | ICD-10-CM | POA: Diagnosis not present

## 2021-12-23 DIAGNOSIS — D631 Anemia in chronic kidney disease: Secondary | ICD-10-CM | POA: Insufficient documentation

## 2021-12-23 DIAGNOSIS — E1122 Type 2 diabetes mellitus with diabetic chronic kidney disease: Secondary | ICD-10-CM | POA: Diagnosis not present

## 2021-12-23 DIAGNOSIS — Z7984 Long term (current) use of oral hypoglycemic drugs: Secondary | ICD-10-CM | POA: Insufficient documentation

## 2021-12-23 DIAGNOSIS — Z79899 Other long term (current) drug therapy: Secondary | ICD-10-CM | POA: Diagnosis not present

## 2021-12-23 DIAGNOSIS — Z809 Family history of malignant neoplasm, unspecified: Secondary | ICD-10-CM | POA: Insufficient documentation

## 2021-12-23 NOTE — Consult Note (Signed)
NEW PATIENT EVALUATION  Name: Cameron Glover  MRN: [id number]  Date:   12/23/2021     DOB: Feb 03, 1950   This 71 y.o. male patient presents to the clinic for initial evaluation of stage IIIa (cT2 cN0 M0) Gleason 7 (3+4) presenting with a PSA in the 44 range  REFERRING PHYSICIAN: [name], PA-C  CHIEF COMPLAINT:  Chief Complaint  Patient presents with   Prostate Cancer    DIAGNOSIS: The encounter diagnosis was Malignant neoplasm of prostate ([place]).   PREVIOUS INVESTIGATIONS:  Pathology reports reviewed MRI of prostate reviewed Clinical notes reviewed  HPI: Patient is a 71 year old male who has been followed since 2018 for low risk prostate cancer.  He was lost to follow-up recently presented to urology with a PSA of 44.  He also had microscopic hematuria.  CT urogram was unremarkable.  Patient underwent a bone scan which was negative for metastatic disease PSMA PET scan was declined.  He underwent transrectal ultrasound-guided biopsy showing 11 of 12 cores positive for mostly Gleason 7 (3+4) with also Gleason 6 adenocarcinoma.  Patient has very little urologic symptoms specifically denies nocturia frequency or urgency.  He is having no bowel problems.  He specifically denies bone pain.  Treatment options have been discussed and patient is favoring radiation.  He has a very large prostate which on physical exam is bulky.  CT scan also was performed showing no evidence of metastatic disease or pelvic lymphadenopathy  PLANNED TREATMENT REGIMEN: Image guided IMRT radiation therapy  PAST MEDICAL HISTORY:  has a past medical history of Acute renal failure (ARF) ([place]), AKI (acute kidney injury) ([place]) (09/02/2017), Anemia due to chronic kidney disease (04/15/2018), DM (diabetes mellitus) with complications ([place]) (8/65/7846), Elevated lipase (04/15/2018), Hyperglycemia without ketosis, Hyperlipidemia, Hypertension, and Nuclear sclerotic cataract of left eye (05/14/2019).    PAST SURGICAL HISTORY:   Past Surgical History:  Procedure Laterality Date   COLONOSCOPY WITH PROPOFOL N/A 01/15/2019   Procedure: COLONOSCOPY WITH PROPOFOL;  Surgeon: Jonathon Bellows, MD;  Location: [facility] ENDOSCOPY;  Service: Gastroenterology;  Laterality: N/A;    FAMILY HISTORY: family history includes CAD in his brother; Cancer in his brother, mother, and sister; Diabetes in his brother, sister, and another family member; Heart disease in his brother and sister; Hyperlipidemia in his brother and sister; Hypertension in his brother and sister; Kidney disease in his sister; Kidney failure in his sister.  SOCIAL HISTORY:  reports that he has never smoked. He has never used smokeless tobacco. He reports that he does not drink alcohol and does not use drugs.  ALLERGIES: Patient has no known allergies.  MEDICATIONS:  Current Outpatient Medications  Medication Sig Dispense Refill   amLODipine (NORVASC) 10 MG tablet Take 1 tablet (10 mg total) by mouth daily. 90 tablet 1   atorvastatin (LIPITOR) 40 MG tablet TAKE 1 TABLET BY MOUTH EVERY DAY 90 tablet 3   FARXIGA 10 MG TABS tablet Take 10 mg by mouth daily.     lisinopril-hydrochlorothiazide (ZESTORETIC) 20-12.5 MG tablet Take 2 tablets by mouth daily. 180 tablet 1   metFORMIN (GLUCOPHAGE) 500 MG tablet TAKE 1 TABLET BY MOUTH TWICE A DAY WITH A MEAL 180 tablet 0   pantoprazole (PROTONIX) 40 MG tablet TAKE 1 TABLET BY MOUTH EVERY DAY 90 tablet 3   No current facility-administered medications for this encounter.    ECOG PERFORMANCE STATUS:  0 - Asymptomatic  REVIEW OF SYSTEMS: Patient denies any weight loss, fatigue, weakness, fever, chills or night sweats. Patient denies any loss  of vision, blurred vision. Patient denies any ringing  of the ears or hearing loss. No irregular heartbeat. Patient denies heart murmur or history of fainting. Patient denies any chest pain or pain radiating to her upper extremities. Patient denies any shortness of breath, difficulty breathing at  night, cough or hemoptysis. Patient denies any swelling in the lower legs. Patient denies any nausea vomiting, vomiting of blood, or coffee ground material in the vomitus. Patient denies any stomach pain. Patient states has had normal bowel movements no significant constipation or diarrhea. Patient denies any dysuria, hematuria or significant nocturia. Patient denies any problems walking, swelling in the joints or loss of balance. Patient denies any skin changes, loss of hair or loss of weight. Patient denies any excessive worrying or anxiety or significant depression. Patient denies any problems with insomnia. Patient denies excessive thirst, polyuria, polydipsia. Patient denies any swollen glands, patient denies easy bruising or easy bleeding. Patient denies any recent infections, allergies or URI. Patient "s visual fields have not changed significantly in recent time.   PHYSICAL EXAM: BP (!) 150/94 (BP Location: Left Arm, Patient Position: Sitting, Cuff Size: Normal)   Pulse 94   Temp (!) 97.3 F (36.3 C) (Tympanic)   Resp 16   Ht '5\' 4"'$  (1.626 m) Comment: stated ht  Wt 172 lb (78 kg)   BMI 29.52 kg/m  Well-developed well-nourished patient in NAD. HEENT reveals PERLA, EOMI, discs not visualized.  Oral cavity is clear. No oral mucosal lesions are identified. Neck is clear without evidence of cervical or supraclavicular adenopathy. Lungs are clear to A&P. Cardiac examination is essentially unremarkable with regular rate and rhythm without murmur rub or thrill. Abdomen is benign with no organomegaly or masses noted. Motor sensory and DTR levels are equal and symmetric in the upper and lower extremities. Cranial nerves II through XII are grossly intact. Proprioception is intact. No peripheral adenopathy or edema is identified. No motor or sensory levels are noted. Crude visual fields are within normal range.  LABORATORY DATA: Pathology reports reviewed    RADIOLOGY RESULTS: Bone scan is reviewed  compatible with above-stated findings.  CT scan also reviewed showing bulky prostate with no evidence of pelvic lymphadenopathy.   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  I have recommended image guided IMRT radiation therapy to both his prostate and pelvic nodes.  His prostate is too large to boost with I-125 interstitial implant.  I have asked Dr. Erlene Quan to place fiducial markers in his prostate for daily image guided treatment.  I have also asked her to start Eligard therapy which she would benefit from at least 2 to 3 years.  Risks and benefits of radiation including increased lower urinary tract symptoms possible diarrhea fatigue alteration of blood counts and skin reaction all were reviewed with the patient.  He comprehends her treatment plan well.  We will schedule simulation after markers are placed.  Patient comprehends my recommendations well.  I would like to take this opportunity to thank you for allowing me to participate in the care of your patient.Noreene Filbert, MD

## 2021-12-30 ENCOUNTER — Telehealth: Payer: Self-pay

## 2021-12-30 NOTE — Telephone Encounter (Signed)
-----   Message from Manus Rudd, RN sent at 12/23/2021  2:15 PM EST ----- Patient needs appointment for Eligard (ADT) and marker placement.  Thanks [employer]

## 2021-12-30 NOTE — Telephone Encounter (Signed)
Eligard PA approved via [facility] portal. Pt scheduled.   PA: [id number]  Dates: 12/30/21 - 12/31/22

## 2022-01-08 ENCOUNTER — Other Ambulatory Visit: Payer: Self-pay | Admitting: Family Medicine

## 2022-01-08 DIAGNOSIS — R809 Proteinuria, unspecified: Secondary | ICD-10-CM

## 2022-01-11 ENCOUNTER — Other Ambulatory Visit: Payer: Self-pay | Admitting: Nurse Practitioner

## 2022-01-11 DIAGNOSIS — I10 Essential (primary) hypertension: Secondary | ICD-10-CM

## 2022-01-20 ENCOUNTER — Encounter: Payer: Self-pay | Admitting: Urology

## 2022-01-20 ENCOUNTER — Ambulatory Visit: Payer: Medicare Other | Admitting: Urology

## 2022-01-20 DIAGNOSIS — C61 Malignant neoplasm of prostate: Secondary | ICD-10-CM | POA: Diagnosis not present

## 2022-01-20 MED ORDER — GENTAMICIN SULFATE 40 MG/ML IJ SOLN
80.0000 mg | Freq: Once | INTRAMUSCULAR | Status: AC
  Administered 2022-01-20: 80 mg via INTRAMUSCULAR

## 2022-01-20 MED ORDER — LEUPROLIDE ACETATE (6 MONTH) 45 MG ~~LOC~~ KIT
45.0000 mg | PACK | Freq: Once | SUBCUTANEOUS | Status: AC
  Administered 2022-01-20: 45 mg via SUBCUTANEOUS

## 2022-01-20 MED ORDER — LEVOFLOXACIN 500 MG PO TABS
500.0000 mg | ORAL_TABLET | Freq: Once | ORAL | Status: AC
  Administered 2022-01-20: 500 mg via ORAL

## 2022-01-20 NOTE — Progress Notes (Signed)
Patient ID: Cameron Glover, male   DOB: [birth year], 72 y.o.   MRN: [id number] Eligard SubQ Injection   Due to Prostate Cancer patient is present today for a Eligard Injection.  Medication: Eligard 6 month Dose: 45 mg  Location: left  Lot: [id number] Exp: [id number]  Patient tolerated well, no complications were noted  Performed by: Edwin Dada, [place]  Per Dr. Huey Bienenstock patient is to continue therapy for 2-3 years . Patient's next follow up was scheduled for 07/13/22. This appointment was scheduled using wheel and given to patient today along with reminder continue on Vitamin D 800-1000iu and Calcium 1000-'1200mg'$  daily while on Androgen Deprivation Therapy.  PA approval dates:

## 2022-01-20 NOTE — Progress Notes (Signed)
01/20/22  CC: gold seed markers  HPI: 72 y.o. male with prostate cancer who presents today for placement of fiducial seed markers in anticipation of his upcoming IMRT with Dr. Baruch Gouty.  Prostate Gold Seed Marker Placement Procedure   Informed consent was obtained after discussing risks/benefits of the procedure.  A time out was performed to ensure correct patient identity.  Pre-Procedure: - Gentamicin given prophylactically - PO Levaquin 500 mg also given today  Procedure: -Lidocaine jelly was administered per rectum -Rectal ultrasound probe was placed without difficulty and the prostate visualized - 3 fiducial gold seed markers placed, one at right base, one at left base, one at apex of prostate gland under transrectal ultrasound guidance  Post-Procedure: - Patient tolerated the procedure well - He was counseled to seek immediate medical attention if experiences any severe pain, significant bleeding, or fevers -Patient was also given Eligard today 38-monthDepo.  Risk and benefits previously reviewed, aware of side effects.  Bone health recommendations were also reviewed.   F/u 6 mo with PSA  AHollice Espy MD

## 2022-01-21 DIAGNOSIS — E113412 Type 2 diabetes mellitus with severe nonproliferative diabetic retinopathy with macular edema, left eye: Secondary | ICD-10-CM | POA: Diagnosis not present

## 2022-01-25 ENCOUNTER — Ambulatory Visit
Admission: RE | Admit: 2022-01-25 | Discharge: 2022-01-25 | Disposition: A | Discharge status: Home / Self Care | Payer: Medicare Other | Source: Ambulatory Visit | Attending: Radiation Oncology | Admitting: Radiation Oncology

## 2022-01-25 DIAGNOSIS — D631 Anemia in chronic kidney disease: Secondary | ICD-10-CM | POA: Diagnosis not present

## 2022-01-25 DIAGNOSIS — N189 Chronic kidney disease, unspecified: Secondary | ICD-10-CM | POA: Diagnosis not present

## 2022-01-25 DIAGNOSIS — I129 Hypertensive chronic kidney disease with stage 1 through stage 4 chronic kidney disease, or unspecified chronic kidney disease: Secondary | ICD-10-CM | POA: Diagnosis not present

## 2022-01-25 DIAGNOSIS — Z7984 Long term (current) use of oral hypoglycemic drugs: Secondary | ICD-10-CM | POA: Diagnosis not present

## 2022-01-25 DIAGNOSIS — Z809 Family history of malignant neoplasm, unspecified: Secondary | ICD-10-CM | POA: Insufficient documentation

## 2022-01-25 DIAGNOSIS — E1122 Type 2 diabetes mellitus with diabetic chronic kidney disease: Secondary | ICD-10-CM | POA: Diagnosis not present

## 2022-01-25 DIAGNOSIS — E785 Hyperlipidemia, unspecified: Secondary | ICD-10-CM | POA: Diagnosis not present

## 2022-01-25 DIAGNOSIS — Z79899 Other long term (current) drug therapy: Secondary | ICD-10-CM | POA: Diagnosis not present

## 2022-01-25 DIAGNOSIS — R3129 Other microscopic hematuria: Secondary | ICD-10-CM | POA: Diagnosis not present

## 2022-01-25 DIAGNOSIS — Z510 Encounter for antineoplastic radiation therapy: Secondary | ICD-10-CM | POA: Insufficient documentation

## 2022-01-25 DIAGNOSIS — Z191 Hormone sensitive malignancy status: Secondary | ICD-10-CM | POA: Diagnosis not present

## 2022-01-25 DIAGNOSIS — C61 Malignant neoplasm of prostate: Secondary | ICD-10-CM | POA: Diagnosis not present

## 2022-01-29 ENCOUNTER — Other Ambulatory Visit: Payer: Self-pay | Admitting: *Deleted

## 2022-01-29 DIAGNOSIS — C61 Malignant neoplasm of prostate: Secondary | ICD-10-CM

## 2022-02-01 DIAGNOSIS — D631 Anemia in chronic kidney disease: Secondary | ICD-10-CM | POA: Diagnosis not present

## 2022-02-01 DIAGNOSIS — Z510 Encounter for antineoplastic radiation therapy: Secondary | ICD-10-CM | POA: Diagnosis not present

## 2022-02-01 DIAGNOSIS — Z7984 Long term (current) use of oral hypoglycemic drugs: Secondary | ICD-10-CM | POA: Diagnosis not present

## 2022-02-01 DIAGNOSIS — R3129 Other microscopic hematuria: Secondary | ICD-10-CM | POA: Diagnosis not present

## 2022-02-01 DIAGNOSIS — Z191 Hormone sensitive malignancy status: Secondary | ICD-10-CM | POA: Diagnosis not present

## 2022-02-01 DIAGNOSIS — N189 Chronic kidney disease, unspecified: Secondary | ICD-10-CM | POA: Diagnosis not present

## 2022-02-01 DIAGNOSIS — I129 Hypertensive chronic kidney disease with stage 1 through stage 4 chronic kidney disease, or unspecified chronic kidney disease: Secondary | ICD-10-CM | POA: Diagnosis not present

## 2022-02-01 DIAGNOSIS — C61 Malignant neoplasm of prostate: Secondary | ICD-10-CM | POA: Diagnosis not present

## 2022-02-01 DIAGNOSIS — Z809 Family history of malignant neoplasm, unspecified: Secondary | ICD-10-CM | POA: Diagnosis not present

## 2022-02-01 DIAGNOSIS — E1122 Type 2 diabetes mellitus with diabetic chronic kidney disease: Secondary | ICD-10-CM | POA: Diagnosis not present

## 2022-02-01 DIAGNOSIS — Z79899 Other long term (current) drug therapy: Secondary | ICD-10-CM | POA: Diagnosis not present

## 2022-02-01 DIAGNOSIS — E785 Hyperlipidemia, unspecified: Secondary | ICD-10-CM | POA: Diagnosis not present

## 2022-02-03 ENCOUNTER — Ambulatory Visit
Admission: RE | Admit: 2022-02-03 | Discharge: 2022-02-03 | Disposition: A | Discharge status: Home / Self Care | Payer: Medicare Other | Source: Ambulatory Visit | Attending: Radiation Oncology | Admitting: Radiation Oncology

## 2022-02-04 ENCOUNTER — Other Ambulatory Visit: Payer: Self-pay

## 2022-02-04 ENCOUNTER — Ambulatory Visit
Admission: RE | Admit: 2022-02-04 | Discharge: 2022-02-04 | Disposition: A | Discharge status: Home / Self Care | Payer: Medicare Other | Source: Ambulatory Visit | Attending: Radiation Oncology | Admitting: Radiation Oncology

## 2022-02-04 DIAGNOSIS — Z191 Hormone sensitive malignancy status: Secondary | ICD-10-CM | POA: Diagnosis not present

## 2022-02-04 DIAGNOSIS — N189 Chronic kidney disease, unspecified: Secondary | ICD-10-CM | POA: Diagnosis not present

## 2022-02-04 DIAGNOSIS — Z510 Encounter for antineoplastic radiation therapy: Secondary | ICD-10-CM | POA: Insufficient documentation

## 2022-02-04 DIAGNOSIS — E1122 Type 2 diabetes mellitus with diabetic chronic kidney disease: Secondary | ICD-10-CM | POA: Insufficient documentation

## 2022-02-04 DIAGNOSIS — I129 Hypertensive chronic kidney disease with stage 1 through stage 4 chronic kidney disease, or unspecified chronic kidney disease: Secondary | ICD-10-CM | POA: Insufficient documentation

## 2022-02-04 DIAGNOSIS — C61 Malignant neoplasm of prostate: Secondary | ICD-10-CM | POA: Diagnosis present

## 2022-02-04 DIAGNOSIS — Z79899 Other long term (current) drug therapy: Secondary | ICD-10-CM | POA: Insufficient documentation

## 2022-02-04 DIAGNOSIS — E785 Hyperlipidemia, unspecified: Secondary | ICD-10-CM | POA: Insufficient documentation

## 2022-02-04 DIAGNOSIS — D631 Anemia in chronic kidney disease: Secondary | ICD-10-CM | POA: Diagnosis not present

## 2022-02-04 DIAGNOSIS — Z7984 Long term (current) use of oral hypoglycemic drugs: Secondary | ICD-10-CM | POA: Insufficient documentation

## 2022-02-04 DIAGNOSIS — R3129 Other microscopic hematuria: Secondary | ICD-10-CM | POA: Diagnosis not present

## 2022-02-04 DIAGNOSIS — Z809 Family history of malignant neoplasm, unspecified: Secondary | ICD-10-CM | POA: Insufficient documentation

## 2022-02-04 LAB — RAD ONC ARIA SESSION SUMMARY
Course Elapsed Days: 0
Plan Fractions Treated to Date: 1
Plan Prescribed Dose Per Fraction: 2 Gy
Plan Total Fractions Prescribed: 40
Plan Total Prescribed Dose: 80 Gy
Reference Point Dosage Given to Date: 2 Gy
Reference Point Session Dosage Given: 2 Gy
Session Number: 1

## 2022-02-05 ENCOUNTER — Ambulatory Visit
Admission: RE | Admit: 2022-02-05 | Discharge: 2022-02-05 | Disposition: A | Discharge status: Home / Self Care | Payer: Medicare Other | Source: Ambulatory Visit | Attending: Radiation Oncology | Admitting: Radiation Oncology

## 2022-02-05 ENCOUNTER — Other Ambulatory Visit: Payer: Self-pay

## 2022-02-05 DIAGNOSIS — C61 Malignant neoplasm of prostate: Secondary | ICD-10-CM | POA: Diagnosis not present

## 2022-02-05 DIAGNOSIS — Z191 Hormone sensitive malignancy status: Secondary | ICD-10-CM | POA: Diagnosis not present

## 2022-02-05 DIAGNOSIS — Z809 Family history of malignant neoplasm, unspecified: Secondary | ICD-10-CM | POA: Diagnosis not present

## 2022-02-05 DIAGNOSIS — I129 Hypertensive chronic kidney disease with stage 1 through stage 4 chronic kidney disease, or unspecified chronic kidney disease: Secondary | ICD-10-CM | POA: Diagnosis not present

## 2022-02-05 DIAGNOSIS — Z79899 Other long term (current) drug therapy: Secondary | ICD-10-CM | POA: Diagnosis not present

## 2022-02-05 DIAGNOSIS — N189 Chronic kidney disease, unspecified: Secondary | ICD-10-CM | POA: Diagnosis not present

## 2022-02-05 DIAGNOSIS — R3129 Other microscopic hematuria: Secondary | ICD-10-CM | POA: Diagnosis not present

## 2022-02-05 DIAGNOSIS — E785 Hyperlipidemia, unspecified: Secondary | ICD-10-CM | POA: Diagnosis not present

## 2022-02-05 DIAGNOSIS — Z7984 Long term (current) use of oral hypoglycemic drugs: Secondary | ICD-10-CM | POA: Diagnosis not present

## 2022-02-05 DIAGNOSIS — D631 Anemia in chronic kidney disease: Secondary | ICD-10-CM | POA: Diagnosis not present

## 2022-02-05 DIAGNOSIS — E1122 Type 2 diabetes mellitus with diabetic chronic kidney disease: Secondary | ICD-10-CM | POA: Diagnosis not present

## 2022-02-05 DIAGNOSIS — Z510 Encounter for antineoplastic radiation therapy: Secondary | ICD-10-CM | POA: Diagnosis not present

## 2022-02-05 LAB — RAD ONC ARIA SESSION SUMMARY
Course Elapsed Days: 1
Plan Fractions Treated to Date: 2
Plan Prescribed Dose Per Fraction: 2 Gy
Plan Total Fractions Prescribed: 40
Plan Total Prescribed Dose: 80 Gy
Reference Point Dosage Given to Date: 4 Gy
Reference Point Session Dosage Given: 2 Gy
Session Number: 2

## 2022-02-08 ENCOUNTER — Ambulatory Visit
Admission: RE | Admit: 2022-02-08 | Discharge: 2022-02-08 | Disposition: A | Discharge status: Home / Self Care | Payer: Medicare Other | Source: Ambulatory Visit | Attending: Radiation Oncology | Admitting: Radiation Oncology

## 2022-02-08 ENCOUNTER — Other Ambulatory Visit: Payer: Self-pay

## 2022-02-08 DIAGNOSIS — E1122 Type 2 diabetes mellitus with diabetic chronic kidney disease: Secondary | ICD-10-CM | POA: Diagnosis not present

## 2022-02-08 DIAGNOSIS — Z7984 Long term (current) use of oral hypoglycemic drugs: Secondary | ICD-10-CM | POA: Diagnosis not present

## 2022-02-08 DIAGNOSIS — I129 Hypertensive chronic kidney disease with stage 1 through stage 4 chronic kidney disease, or unspecified chronic kidney disease: Secondary | ICD-10-CM | POA: Diagnosis not present

## 2022-02-08 DIAGNOSIS — Z510 Encounter for antineoplastic radiation therapy: Secondary | ICD-10-CM | POA: Diagnosis not present

## 2022-02-08 DIAGNOSIS — C61 Malignant neoplasm of prostate: Secondary | ICD-10-CM | POA: Diagnosis not present

## 2022-02-08 DIAGNOSIS — E785 Hyperlipidemia, unspecified: Secondary | ICD-10-CM | POA: Diagnosis not present

## 2022-02-08 DIAGNOSIS — R3129 Other microscopic hematuria: Secondary | ICD-10-CM | POA: Diagnosis not present

## 2022-02-08 DIAGNOSIS — Z191 Hormone sensitive malignancy status: Secondary | ICD-10-CM | POA: Diagnosis not present

## 2022-02-08 DIAGNOSIS — D631 Anemia in chronic kidney disease: Secondary | ICD-10-CM | POA: Diagnosis not present

## 2022-02-08 DIAGNOSIS — N189 Chronic kidney disease, unspecified: Secondary | ICD-10-CM | POA: Diagnosis not present

## 2022-02-08 DIAGNOSIS — Z79899 Other long term (current) drug therapy: Secondary | ICD-10-CM | POA: Diagnosis not present

## 2022-02-08 DIAGNOSIS — Z809 Family history of malignant neoplasm, unspecified: Secondary | ICD-10-CM | POA: Diagnosis not present

## 2022-02-08 LAB — RAD ONC ARIA SESSION SUMMARY
Course Elapsed Days: 4
Plan Fractions Treated to Date: 3
Plan Prescribed Dose Per Fraction: 2 Gy
Plan Total Fractions Prescribed: 40
Plan Total Prescribed Dose: 80 Gy
Reference Point Dosage Given to Date: 6 Gy
Reference Point Session Dosage Given: 2 Gy
Session Number: 3

## 2022-02-09 ENCOUNTER — Ambulatory Visit
Admission: RE | Admit: 2022-02-09 | Discharge: 2022-02-09 | Disposition: A | Discharge status: Home / Self Care | Payer: Medicare Other | Source: Ambulatory Visit | Attending: Radiation Oncology | Admitting: Radiation Oncology

## 2022-02-09 ENCOUNTER — Other Ambulatory Visit: Payer: Self-pay

## 2022-02-09 DIAGNOSIS — Z510 Encounter for antineoplastic radiation therapy: Secondary | ICD-10-CM | POA: Diagnosis not present

## 2022-02-09 DIAGNOSIS — Z191 Hormone sensitive malignancy status: Secondary | ICD-10-CM | POA: Diagnosis not present

## 2022-02-09 DIAGNOSIS — R3129 Other microscopic hematuria: Secondary | ICD-10-CM | POA: Diagnosis not present

## 2022-02-09 DIAGNOSIS — N189 Chronic kidney disease, unspecified: Secondary | ICD-10-CM | POA: Diagnosis not present

## 2022-02-09 DIAGNOSIS — Z809 Family history of malignant neoplasm, unspecified: Secondary | ICD-10-CM | POA: Diagnosis not present

## 2022-02-09 DIAGNOSIS — D631 Anemia in chronic kidney disease: Secondary | ICD-10-CM | POA: Diagnosis not present

## 2022-02-09 DIAGNOSIS — I129 Hypertensive chronic kidney disease with stage 1 through stage 4 chronic kidney disease, or unspecified chronic kidney disease: Secondary | ICD-10-CM | POA: Diagnosis not present

## 2022-02-09 DIAGNOSIS — E1122 Type 2 diabetes mellitus with diabetic chronic kidney disease: Secondary | ICD-10-CM | POA: Diagnosis not present

## 2022-02-09 DIAGNOSIS — C61 Malignant neoplasm of prostate: Secondary | ICD-10-CM | POA: Diagnosis not present

## 2022-02-09 DIAGNOSIS — Z79899 Other long term (current) drug therapy: Secondary | ICD-10-CM | POA: Diagnosis not present

## 2022-02-09 DIAGNOSIS — Z7984 Long term (current) use of oral hypoglycemic drugs: Secondary | ICD-10-CM | POA: Diagnosis not present

## 2022-02-09 DIAGNOSIS — E785 Hyperlipidemia, unspecified: Secondary | ICD-10-CM | POA: Diagnosis not present

## 2022-02-09 LAB — RAD ONC ARIA SESSION SUMMARY
Course Elapsed Days: 5
Plan Fractions Treated to Date: 4
Plan Prescribed Dose Per Fraction: 2 Gy
Plan Total Fractions Prescribed: 40
Plan Total Prescribed Dose: 80 Gy
Reference Point Dosage Given to Date: 8 Gy
Reference Point Session Dosage Given: 2 Gy
Session Number: 4

## 2022-02-10 ENCOUNTER — Ambulatory Visit
Admission: RE | Admit: 2022-02-10 | Discharge: 2022-02-10 | Disposition: A | Discharge status: Home / Self Care | Payer: Medicare Other | Source: Ambulatory Visit | Attending: Radiation Oncology | Admitting: Radiation Oncology

## 2022-02-10 ENCOUNTER — Other Ambulatory Visit: Payer: Self-pay

## 2022-02-10 DIAGNOSIS — R3129 Other microscopic hematuria: Secondary | ICD-10-CM | POA: Diagnosis not present

## 2022-02-10 DIAGNOSIS — Z7984 Long term (current) use of oral hypoglycemic drugs: Secondary | ICD-10-CM | POA: Diagnosis not present

## 2022-02-10 DIAGNOSIS — Z79899 Other long term (current) drug therapy: Secondary | ICD-10-CM | POA: Diagnosis not present

## 2022-02-10 DIAGNOSIS — Z510 Encounter for antineoplastic radiation therapy: Secondary | ICD-10-CM | POA: Diagnosis not present

## 2022-02-10 DIAGNOSIS — Z809 Family history of malignant neoplasm, unspecified: Secondary | ICD-10-CM | POA: Diagnosis not present

## 2022-02-10 DIAGNOSIS — C61 Malignant neoplasm of prostate: Secondary | ICD-10-CM | POA: Diagnosis not present

## 2022-02-10 DIAGNOSIS — I129 Hypertensive chronic kidney disease with stage 1 through stage 4 chronic kidney disease, or unspecified chronic kidney disease: Secondary | ICD-10-CM | POA: Diagnosis not present

## 2022-02-10 DIAGNOSIS — E1122 Type 2 diabetes mellitus with diabetic chronic kidney disease: Secondary | ICD-10-CM | POA: Diagnosis not present

## 2022-02-10 DIAGNOSIS — D631 Anemia in chronic kidney disease: Secondary | ICD-10-CM | POA: Diagnosis not present

## 2022-02-10 DIAGNOSIS — N189 Chronic kidney disease, unspecified: Secondary | ICD-10-CM | POA: Diagnosis not present

## 2022-02-10 DIAGNOSIS — E785 Hyperlipidemia, unspecified: Secondary | ICD-10-CM | POA: Diagnosis not present

## 2022-02-10 DIAGNOSIS — Z191 Hormone sensitive malignancy status: Secondary | ICD-10-CM | POA: Diagnosis not present

## 2022-02-10 LAB — RAD ONC ARIA SESSION SUMMARY
Course Elapsed Days: 6
Plan Fractions Treated to Date: 5
Plan Prescribed Dose Per Fraction: 2 Gy
Plan Total Fractions Prescribed: 40
Plan Total Prescribed Dose: 80 Gy
Reference Point Dosage Given to Date: 10 Gy
Reference Point Session Dosage Given: 2 Gy
Session Number: 5

## 2022-02-11 ENCOUNTER — Other Ambulatory Visit: Payer: Self-pay

## 2022-02-11 ENCOUNTER — Inpatient Hospital Stay: Payer: Medicare Other

## 2022-02-11 ENCOUNTER — Ambulatory Visit
Admission: RE | Admit: 2022-02-11 | Discharge: 2022-02-11 | Disposition: A | Discharge status: Home / Self Care | Payer: Medicare Other | Source: Ambulatory Visit | Attending: Radiation Oncology | Admitting: Radiation Oncology

## 2022-02-11 DIAGNOSIS — E785 Hyperlipidemia, unspecified: Secondary | ICD-10-CM | POA: Diagnosis not present

## 2022-02-11 DIAGNOSIS — C61 Malignant neoplasm of prostate: Secondary | ICD-10-CM | POA: Insufficient documentation

## 2022-02-11 DIAGNOSIS — I129 Hypertensive chronic kidney disease with stage 1 through stage 4 chronic kidney disease, or unspecified chronic kidney disease: Secondary | ICD-10-CM | POA: Diagnosis not present

## 2022-02-11 DIAGNOSIS — Z79899 Other long term (current) drug therapy: Secondary | ICD-10-CM | POA: Diagnosis not present

## 2022-02-11 DIAGNOSIS — Z510 Encounter for antineoplastic radiation therapy: Secondary | ICD-10-CM | POA: Diagnosis not present

## 2022-02-11 DIAGNOSIS — N189 Chronic kidney disease, unspecified: Secondary | ICD-10-CM | POA: Diagnosis not present

## 2022-02-11 DIAGNOSIS — Z809 Family history of malignant neoplasm, unspecified: Secondary | ICD-10-CM | POA: Diagnosis not present

## 2022-02-11 DIAGNOSIS — E1122 Type 2 diabetes mellitus with diabetic chronic kidney disease: Secondary | ICD-10-CM | POA: Diagnosis not present

## 2022-02-11 DIAGNOSIS — Z191 Hormone sensitive malignancy status: Secondary | ICD-10-CM | POA: Diagnosis not present

## 2022-02-11 DIAGNOSIS — R3129 Other microscopic hematuria: Secondary | ICD-10-CM | POA: Diagnosis not present

## 2022-02-11 DIAGNOSIS — Z7984 Long term (current) use of oral hypoglycemic drugs: Secondary | ICD-10-CM | POA: Diagnosis not present

## 2022-02-11 DIAGNOSIS — D631 Anemia in chronic kidney disease: Secondary | ICD-10-CM | POA: Diagnosis not present

## 2022-02-11 LAB — RAD ONC ARIA SESSION SUMMARY
Course Elapsed Days: 7
Plan Fractions Treated to Date: 6
Plan Prescribed Dose Per Fraction: 2 Gy
Plan Total Fractions Prescribed: 40
Plan Total Prescribed Dose: 80 Gy
Reference Point Dosage Given to Date: 12 Gy
Reference Point Session Dosage Given: 2 Gy
Session Number: 6

## 2022-02-11 LAB — CBC
HCT: 32.5 % — ABNORMAL LOW (ref 39.0–52.0)
Hemoglobin: 10.9 g/dL — ABNORMAL LOW (ref 13.0–17.0)
MCH: 31.6 pg (ref 26.0–34.0)
MCHC: 33.5 g/dL (ref 30.0–36.0)
MCV: 94.2 fL (ref 80.0–100.0)
Platelets: 298 10*3/uL (ref 150–400)
RBC: 3.45 MIL/uL — ABNORMAL LOW (ref 4.22–5.81)
RDW: 12.1 % (ref 11.5–15.5)
WBC: 4.7 10*3/uL (ref 4.0–10.5)
nRBC: 0 % (ref 0.0–0.2)

## 2022-02-12 ENCOUNTER — Other Ambulatory Visit: Payer: Self-pay

## 2022-02-12 ENCOUNTER — Ambulatory Visit
Admission: RE | Admit: 2022-02-12 | Discharge: 2022-02-12 | Disposition: A | Discharge status: Home / Self Care | Payer: Medicare Other | Source: Ambulatory Visit | Attending: Radiation Oncology | Admitting: Radiation Oncology

## 2022-02-12 DIAGNOSIS — Z510 Encounter for antineoplastic radiation therapy: Secondary | ICD-10-CM | POA: Diagnosis not present

## 2022-02-12 DIAGNOSIS — R3129 Other microscopic hematuria: Secondary | ICD-10-CM | POA: Diagnosis not present

## 2022-02-12 DIAGNOSIS — E1122 Type 2 diabetes mellitus with diabetic chronic kidney disease: Secondary | ICD-10-CM | POA: Diagnosis not present

## 2022-02-12 DIAGNOSIS — I129 Hypertensive chronic kidney disease with stage 1 through stage 4 chronic kidney disease, or unspecified chronic kidney disease: Secondary | ICD-10-CM | POA: Diagnosis not present

## 2022-02-12 DIAGNOSIS — Z191 Hormone sensitive malignancy status: Secondary | ICD-10-CM | POA: Diagnosis not present

## 2022-02-12 DIAGNOSIS — C61 Malignant neoplasm of prostate: Secondary | ICD-10-CM | POA: Diagnosis not present

## 2022-02-12 DIAGNOSIS — Z809 Family history of malignant neoplasm, unspecified: Secondary | ICD-10-CM | POA: Diagnosis not present

## 2022-02-12 DIAGNOSIS — D631 Anemia in chronic kidney disease: Secondary | ICD-10-CM | POA: Diagnosis not present

## 2022-02-12 DIAGNOSIS — Z79899 Other long term (current) drug therapy: Secondary | ICD-10-CM | POA: Diagnosis not present

## 2022-02-12 DIAGNOSIS — Z7984 Long term (current) use of oral hypoglycemic drugs: Secondary | ICD-10-CM | POA: Diagnosis not present

## 2022-02-12 DIAGNOSIS — N189 Chronic kidney disease, unspecified: Secondary | ICD-10-CM | POA: Diagnosis not present

## 2022-02-12 DIAGNOSIS — E785 Hyperlipidemia, unspecified: Secondary | ICD-10-CM | POA: Diagnosis not present

## 2022-02-12 LAB — RAD ONC ARIA SESSION SUMMARY
Course Elapsed Days: 8
Plan Fractions Treated to Date: 7
Plan Prescribed Dose Per Fraction: 2 Gy
Plan Total Fractions Prescribed: 40
Plan Total Prescribed Dose: 80 Gy
Reference Point Dosage Given to Date: 14 Gy
Reference Point Session Dosage Given: 2 Gy
Session Number: 7

## 2022-02-15 ENCOUNTER — Other Ambulatory Visit: Payer: Self-pay

## 2022-02-15 ENCOUNTER — Ambulatory Visit
Admission: RE | Admit: 2022-02-15 | Discharge: 2022-02-15 | Disposition: A | Discharge status: Home / Self Care | Payer: Medicare Other | Source: Ambulatory Visit | Attending: Radiation Oncology | Admitting: Radiation Oncology

## 2022-02-15 DIAGNOSIS — E1122 Type 2 diabetes mellitus with diabetic chronic kidney disease: Secondary | ICD-10-CM | POA: Diagnosis not present

## 2022-02-15 DIAGNOSIS — C61 Malignant neoplasm of prostate: Secondary | ICD-10-CM | POA: Diagnosis not present

## 2022-02-15 DIAGNOSIS — Z191 Hormone sensitive malignancy status: Secondary | ICD-10-CM | POA: Diagnosis not present

## 2022-02-15 DIAGNOSIS — R3129 Other microscopic hematuria: Secondary | ICD-10-CM | POA: Diagnosis not present

## 2022-02-15 DIAGNOSIS — Z510 Encounter for antineoplastic radiation therapy: Secondary | ICD-10-CM | POA: Diagnosis not present

## 2022-02-15 DIAGNOSIS — Z79899 Other long term (current) drug therapy: Secondary | ICD-10-CM | POA: Diagnosis not present

## 2022-02-15 DIAGNOSIS — Z809 Family history of malignant neoplasm, unspecified: Secondary | ICD-10-CM | POA: Diagnosis not present

## 2022-02-15 DIAGNOSIS — N189 Chronic kidney disease, unspecified: Secondary | ICD-10-CM | POA: Diagnosis not present

## 2022-02-15 DIAGNOSIS — Z7984 Long term (current) use of oral hypoglycemic drugs: Secondary | ICD-10-CM | POA: Diagnosis not present

## 2022-02-15 DIAGNOSIS — D631 Anemia in chronic kidney disease: Secondary | ICD-10-CM | POA: Diagnosis not present

## 2022-02-15 DIAGNOSIS — E785 Hyperlipidemia, unspecified: Secondary | ICD-10-CM | POA: Diagnosis not present

## 2022-02-15 DIAGNOSIS — I129 Hypertensive chronic kidney disease with stage 1 through stage 4 chronic kidney disease, or unspecified chronic kidney disease: Secondary | ICD-10-CM | POA: Diagnosis not present

## 2022-02-15 LAB — RAD ONC ARIA SESSION SUMMARY
Course Elapsed Days: 11
Plan Fractions Treated to Date: 8
Plan Prescribed Dose Per Fraction: 2 Gy
Plan Total Fractions Prescribed: 40
Plan Total Prescribed Dose: 80 Gy
Reference Point Dosage Given to Date: 16 Gy
Reference Point Session Dosage Given: 2 Gy
Session Number: 8

## 2022-02-16 ENCOUNTER — Other Ambulatory Visit: Payer: Self-pay

## 2022-02-16 ENCOUNTER — Ambulatory Visit
Admission: RE | Admit: 2022-02-16 | Discharge: 2022-02-16 | Disposition: A | Discharge status: Home / Self Care | Payer: Medicare Other | Source: Ambulatory Visit | Attending: Radiation Oncology | Admitting: Radiation Oncology

## 2022-02-16 DIAGNOSIS — E785 Hyperlipidemia, unspecified: Secondary | ICD-10-CM | POA: Diagnosis not present

## 2022-02-16 DIAGNOSIS — Z7984 Long term (current) use of oral hypoglycemic drugs: Secondary | ICD-10-CM | POA: Diagnosis not present

## 2022-02-16 DIAGNOSIS — Z191 Hormone sensitive malignancy status: Secondary | ICD-10-CM | POA: Diagnosis not present

## 2022-02-16 DIAGNOSIS — R3129 Other microscopic hematuria: Secondary | ICD-10-CM | POA: Diagnosis not present

## 2022-02-16 DIAGNOSIS — Z79899 Other long term (current) drug therapy: Secondary | ICD-10-CM | POA: Diagnosis not present

## 2022-02-16 DIAGNOSIS — E1122 Type 2 diabetes mellitus with diabetic chronic kidney disease: Secondary | ICD-10-CM | POA: Diagnosis not present

## 2022-02-16 DIAGNOSIS — C61 Malignant neoplasm of prostate: Secondary | ICD-10-CM | POA: Diagnosis not present

## 2022-02-16 DIAGNOSIS — Z809 Family history of malignant neoplasm, unspecified: Secondary | ICD-10-CM | POA: Diagnosis not present

## 2022-02-16 DIAGNOSIS — Z510 Encounter for antineoplastic radiation therapy: Secondary | ICD-10-CM | POA: Diagnosis not present

## 2022-02-16 DIAGNOSIS — I129 Hypertensive chronic kidney disease with stage 1 through stage 4 chronic kidney disease, or unspecified chronic kidney disease: Secondary | ICD-10-CM | POA: Diagnosis not present

## 2022-02-16 DIAGNOSIS — D631 Anemia in chronic kidney disease: Secondary | ICD-10-CM | POA: Diagnosis not present

## 2022-02-16 DIAGNOSIS — N189 Chronic kidney disease, unspecified: Secondary | ICD-10-CM | POA: Diagnosis not present

## 2022-02-16 LAB — RAD ONC ARIA SESSION SUMMARY
Course Elapsed Days: 12
Plan Fractions Treated to Date: 9
Plan Prescribed Dose Per Fraction: 2 Gy
Plan Total Fractions Prescribed: 40
Plan Total Prescribed Dose: 80 Gy
Reference Point Dosage Given to Date: 18 Gy
Reference Point Session Dosage Given: 2 Gy
Session Number: 9

## 2022-02-17 ENCOUNTER — Other Ambulatory Visit: Payer: Self-pay

## 2022-02-17 ENCOUNTER — Ambulatory Visit
Admission: RE | Admit: 2022-02-17 | Discharge: 2022-02-17 | Disposition: A | Discharge status: Home / Self Care | Payer: Medicare Other | Source: Ambulatory Visit | Attending: Radiation Oncology | Admitting: Radiation Oncology

## 2022-02-17 DIAGNOSIS — Z809 Family history of malignant neoplasm, unspecified: Secondary | ICD-10-CM | POA: Diagnosis not present

## 2022-02-17 DIAGNOSIS — E785 Hyperlipidemia, unspecified: Secondary | ICD-10-CM | POA: Diagnosis not present

## 2022-02-17 DIAGNOSIS — C61 Malignant neoplasm of prostate: Secondary | ICD-10-CM | POA: Diagnosis not present

## 2022-02-17 DIAGNOSIS — D631 Anemia in chronic kidney disease: Secondary | ICD-10-CM | POA: Diagnosis not present

## 2022-02-17 DIAGNOSIS — Z191 Hormone sensitive malignancy status: Secondary | ICD-10-CM | POA: Diagnosis not present

## 2022-02-17 DIAGNOSIS — Z79899 Other long term (current) drug therapy: Secondary | ICD-10-CM | POA: Diagnosis not present

## 2022-02-17 DIAGNOSIS — E1122 Type 2 diabetes mellitus with diabetic chronic kidney disease: Secondary | ICD-10-CM | POA: Diagnosis not present

## 2022-02-17 DIAGNOSIS — Z510 Encounter for antineoplastic radiation therapy: Secondary | ICD-10-CM | POA: Diagnosis not present

## 2022-02-17 DIAGNOSIS — Z7984 Long term (current) use of oral hypoglycemic drugs: Secondary | ICD-10-CM | POA: Diagnosis not present

## 2022-02-17 DIAGNOSIS — I129 Hypertensive chronic kidney disease with stage 1 through stage 4 chronic kidney disease, or unspecified chronic kidney disease: Secondary | ICD-10-CM | POA: Diagnosis not present

## 2022-02-17 DIAGNOSIS — R3129 Other microscopic hematuria: Secondary | ICD-10-CM | POA: Diagnosis not present

## 2022-02-17 DIAGNOSIS — N189 Chronic kidney disease, unspecified: Secondary | ICD-10-CM | POA: Diagnosis not present

## 2022-02-17 LAB — RAD ONC ARIA SESSION SUMMARY
Course Elapsed Days: 13
Plan Fractions Treated to Date: 10
Plan Prescribed Dose Per Fraction: 2 Gy
Plan Total Fractions Prescribed: 40
Plan Total Prescribed Dose: 80 Gy
Reference Point Dosage Given to Date: 20 Gy
Reference Point Session Dosage Given: 2 Gy
Session Number: 10

## 2022-02-18 ENCOUNTER — Other Ambulatory Visit: Payer: Self-pay

## 2022-02-18 ENCOUNTER — Ambulatory Visit (INDEPENDENT_AMBULATORY_CARE_PROVIDER_SITE_OTHER): Payer: Medicare Other | Admitting: Family Medicine

## 2022-02-18 ENCOUNTER — Ambulatory Visit
Admission: RE | Admit: 2022-02-18 | Discharge: 2022-02-18 | Disposition: A | Discharge status: Home / Self Care | Payer: Medicare Other | Source: Ambulatory Visit | Attending: Radiation Oncology | Admitting: Radiation Oncology

## 2022-02-18 ENCOUNTER — Encounter: Payer: Self-pay | Admitting: Family Medicine

## 2022-02-18 VITALS — BP 158/84 | HR 98 | Temp 98.1°F | Resp 16 | Ht 64.0 in | Wt 177.7 lb

## 2022-02-18 DIAGNOSIS — E1169 Type 2 diabetes mellitus with other specified complication: Secondary | ICD-10-CM

## 2022-02-18 DIAGNOSIS — E785 Hyperlipidemia, unspecified: Secondary | ICD-10-CM | POA: Diagnosis not present

## 2022-02-18 DIAGNOSIS — R809 Proteinuria, unspecified: Secondary | ICD-10-CM

## 2022-02-18 DIAGNOSIS — E113412 Type 2 diabetes mellitus with severe nonproliferative diabetic retinopathy with macular edema, left eye: Secondary | ICD-10-CM

## 2022-02-18 DIAGNOSIS — E1122 Type 2 diabetes mellitus with diabetic chronic kidney disease: Secondary | ICD-10-CM

## 2022-02-18 DIAGNOSIS — E1129 Type 2 diabetes mellitus with other diabetic kidney complication: Secondary | ICD-10-CM | POA: Diagnosis not present

## 2022-02-18 DIAGNOSIS — I10 Essential (primary) hypertension: Secondary | ICD-10-CM | POA: Diagnosis not present

## 2022-02-18 DIAGNOSIS — C61 Malignant neoplasm of prostate: Secondary | ICD-10-CM | POA: Diagnosis not present

## 2022-02-18 DIAGNOSIS — Z5181 Encounter for therapeutic drug level monitoring: Secondary | ICD-10-CM

## 2022-02-18 DIAGNOSIS — Z809 Family history of malignant neoplasm, unspecified: Secondary | ICD-10-CM | POA: Diagnosis not present

## 2022-02-18 DIAGNOSIS — N2581 Secondary hyperparathyroidism of renal origin: Secondary | ICD-10-CM

## 2022-02-18 DIAGNOSIS — D631 Anemia in chronic kidney disease: Secondary | ICD-10-CM | POA: Diagnosis not present

## 2022-02-18 DIAGNOSIS — K219 Gastro-esophageal reflux disease without esophagitis: Secondary | ICD-10-CM

## 2022-02-18 DIAGNOSIS — N189 Chronic kidney disease, unspecified: Secondary | ICD-10-CM | POA: Diagnosis not present

## 2022-02-18 DIAGNOSIS — Z191 Hormone sensitive malignancy status: Secondary | ICD-10-CM | POA: Diagnosis not present

## 2022-02-18 DIAGNOSIS — Z79899 Other long term (current) drug therapy: Secondary | ICD-10-CM | POA: Diagnosis not present

## 2022-02-18 DIAGNOSIS — Z7984 Long term (current) use of oral hypoglycemic drugs: Secondary | ICD-10-CM | POA: Diagnosis not present

## 2022-02-18 DIAGNOSIS — I129 Hypertensive chronic kidney disease with stage 1 through stage 4 chronic kidney disease, or unspecified chronic kidney disease: Secondary | ICD-10-CM | POA: Diagnosis not present

## 2022-02-18 DIAGNOSIS — N183 Chronic kidney disease, stage 3 unspecified: Secondary | ICD-10-CM

## 2022-02-18 DIAGNOSIS — Z510 Encounter for antineoplastic radiation therapy: Secondary | ICD-10-CM | POA: Diagnosis not present

## 2022-02-18 DIAGNOSIS — R3129 Other microscopic hematuria: Secondary | ICD-10-CM | POA: Diagnosis not present

## 2022-02-18 LAB — RAD ONC ARIA SESSION SUMMARY
Course Elapsed Days: 14
Plan Fractions Treated to Date: 11
Plan Prescribed Dose Per Fraction: 2 Gy
Plan Total Fractions Prescribed: 40
Plan Total Prescribed Dose: 80 Gy
Reference Point Dosage Given to Date: 22 Gy
Reference Point Session Dosage Given: 2 Gy
Session Number: 11

## 2022-02-18 MED ORDER — METFORMIN HCL 500 MG PO TABS: 500.0000 mg | ORAL_TABLET | Freq: Every day | ORAL | 1 refills | Status: DC

## 2022-02-18 MED ORDER — HYDRALAZINE HCL 25 MG PO TABS: 25.0000 mg | ORAL_TABLET | Freq: Three times a day (TID) | ORAL | 2 refills | Status: DC

## 2022-02-18 NOTE — Progress Notes (Signed)
Name: Cameron Glover   MRN: [id number]    DOB: 01-08-1950   Date:02/18/2022       Progress Note  Chief Complaint  Patient presents with   Follow-up   Hypertension   Hyperlipidemia   Diabetes     Subjective:   Cameron Glover is a 72 y.o. male, presents to clinic for routine f/up  1. Prostate cancer ([place]) Upstaging to technically high risk based on PSA, high-volume Gleason 3+4 with PSA of 44  Doing RT   CKD - worsening renal function Previously he consulted nephrology in [place] Last office visit they adjusted his blood pressure medications -PDF from the media tab was printed and labs and plan were reviewed He just did labs with nephrology and follows up with them next week so we will request lab results prior to adding on any more labs today   DM -on Farxiga and nephrology decreased dose of metformin to 500 mg once daily renal dosing Lab Results  Component Value Date   HGBA1C 5.2 08/17/2021   Hypertension:  Has been poorly controlled on lisinopril-HCTZ and norvasc -he reports blood pressure is always higher when he is here and he continues to not have a BP cuff at home. Nephrology did increase his lisinopril HCTZ to maximum dose of 40-25 and amlodipine is 10 mg BP Readings from Last 3 Encounters:  02/18/22 (!) 178/86  12/23/21 (!) 150/94  11/19/21 (!) 150/97  Pt denies CP, SOB, exertional sx, LE edema, palpitation, Ha's, visual disturbances, lightheadedness, hypotension, syncope. Dietary efforts for BP? Doing everything   Hyperlipidemia:  labs done at last appt LDL well controlled and at goal and current tx Currently treated with lipitor 40 mg, pt reports good med compliance Last Lipids: Lab Results  Component Value Date   CHOL 83 08/17/2021   HDL 32 (L) 08/17/2021   LDLCALC 34 08/17/2021   TRIG 91 08/17/2021   CHOLHDL 2.6 08/17/2021   - Denies: Chest pain, shortness of breath, myalgias, claudication  Lab Results  Component Value Date   VITAMINB12 250  08/11/2020       Current Outpatient Medications:    amLODipine (NORVASC) 10 MG tablet, TAKE 1 TABLET BY MOUTH EVERY DAY, Disp: 90 tablet, Rfl: 1   atorvastatin (LIPITOR) 40 MG tablet, TAKE 1 TABLET BY MOUTH EVERY DAY, Disp: 90 tablet, Rfl: 3   FARXIGA 10 MG TABS tablet, Take 10 mg by mouth daily., Disp: , Rfl:    lisinopril-hydrochlorothiazide (ZESTORETIC) 20-12.5 MG tablet, Take 2 tablets by mouth daily., Disp: 180 tablet, Rfl: 1   metFORMIN (GLUCOPHAGE) 500 MG tablet, TAKE 1 TABLET BY MOUTH TWICE A DAY WITH FOOD, Disp: 180 tablet, Rfl: 0   Oyster Shell Calcium 500 MG TABS, Take 1 tablet by mouth 2 (two) times daily., Disp: , Rfl:    pantoprazole (PROTONIX) 40 MG tablet, TAKE 1 TABLET BY MOUTH EVERY DAY, Disp: 90 tablet, Rfl: 3  Patient Active Problem List   Diagnosis Date Noted   History of prostate cancer 08/17/2021   Grade 3 hypertensive retinopathy, bilateral 04/29/2021   Controlled type 2 diabetes mellitus with microalbuminuria, without long-term current use of insulin ([place]) 03/30/2021   Snores 11/17/2020   Pseudophakia of left eye 10/13/2020   Anemia 08/11/2020   Gastroesophageal reflux disease 08/11/2020   Vitamin B12 deficiency 08/11/2020   Vitamin D deficiency 08/11/2020   Elevated parathyroid hormone 08/11/2020   Secondary hyperparathyroidism ([place]) 08/11/2020   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   Severe nonproliferative diabetic retinopathy  of left eye, with macular edema, associated with type 2 diabetes mellitus ([address]) 05/14/2019   Severe nonproliferative diabetic retinopathy of right eye, with macular edema, associated with type 2 diabetes mellitus ([place]) 05/14/2019   Retinal hemorrhage of right eye 05/14/2019   Retinal hemorrhage of left eye 05/14/2019   Nuclear sclerotic cataract of right eye 05/14/2019   LVH (left ventricular hypertrophy) 03/23/2019   Hyperlipidemia associated with type 2 diabetes mellitus ([place]) 09/22/2018   CKD stage 3 due to type 2  diabetes mellitus ([place]) 09/22/2018   Essential hypertension 04/15/2018    Past Surgical History:  Procedure Laterality Date   COLONOSCOPY WITH PROPOFOL N/A 01/15/2019   Procedure: COLONOSCOPY WITH PROPOFOL;  Surgeon: Jonathon Bellows, MD;  Location: [facility] ENDOSCOPY;  Service: Gastroenterology;  Laterality: N/A;    Family History  Problem Relation Age of Onset   Kidney failure Sister    Cancer Sister    Diabetes Sister    Hypertension Sister    Hyperlipidemia Sister    Heart disease Sister    Kidney disease Sister    Cancer Brother    CAD Brother    Diabetes Brother    Hypertension Brother    Hyperlipidemia Brother    Heart disease Brother    Diabetes Other    Cancer Mother     Social History   Tobacco Use   Smoking status: Never    Passive exposure: Never   Smokeless tobacco: Never  Vaping Use   Vaping Use: Never used  Substance Use Topics   Alcohol use: Never   Drug use: Never     No Known Allergies  Health Maintenance  Topic Date Due   Diabetic kidney evaluation - Urine ACR  Never done   DTaP/Tdap/Td (1 - Tdap) Never done   HEMOGLOBIN A1C  02/17/2022   COVID-19 Vaccine (4 - 2023-24 season) 03/06/2022 (Originally 09/04/2021)   INFLUENZA VACCINE  04/04/2022 (Originally 08/04/2021)   Zoster Vaccines- Shingrix (1 of 2) 05/19/2022 (Originally 11/14/1971)   OPHTHALMOLOGY EXAM  04/30/2022   Medicare Annual Wellness (AWV)  05/27/2022   Diabetic kidney evaluation - eGFR measurement  08/18/2022   FOOT EXAM  08/18/2022   COLONOSCOPY (Pts 45-26yr Insurance coverage will need to be confirmed)  01/15/2024   Pneumonia Vaccine 70 Years old  Completed   Hepatitis C Screening  Completed   HPV VACCINES  Aged Out    Chart Review Today: I personally reviewed active problem list, medication list, allergies, family history, social history, health maintenance, notes from last encounter, lab results, imaging with the patient/caregiver today.   Review of Systems  Constitutional:  Negative.   HENT: Negative.    Eyes: Negative.   Respiratory: Negative.    Cardiovascular: Negative.   Gastrointestinal: Negative.   Endocrine: Negative.   Genitourinary: Negative.   Musculoskeletal: Negative.   Skin: Negative.   Allergic/Immunologic: Negative.   Neurological: Negative.   Hematological: Negative.   Psychiatric/Behavioral: Negative.    All other systems reviewed and are negative.    Objective:   Vitals:   02/18/22 0911 02/18/22 0953  BP: (!) 178/86 (!) 158/84  Pulse: 98   Resp: 16   Temp: 98.1 F (36.7 C)   TempSrc: Oral   SpO2: 99%   Weight: 177 lb 11.2 oz (80.6 kg)   Height: 5' 4"$  (1.626 m)     Body mass index is 30.5 kg/m.  Physical Exam Vitals and nursing note reviewed.  Constitutional:      General: He is not in  acute distress.    Appearance: Normal appearance. He is well-developed. He is not ill-appearing, toxic-appearing or diaphoretic.  HENT:     Head: Normocephalic and atraumatic.     Nose: Nose normal.  Eyes:     General:        Right eye: No discharge.        Left eye: No discharge.     Conjunctiva/sclera: Conjunctivae normal.  Neck:     Trachea: No tracheal deviation.  Cardiovascular:     Rate and Rhythm: Normal rate and regular rhythm.     Pulses: Normal pulses.     Heart sounds: Normal heart sounds. No murmur heard.    No friction rub. No gallop.  Pulmonary:     Effort: Pulmonary effort is normal. No respiratory distress.     Breath sounds: Normal breath sounds. No stridor. No wheezing, rhonchi or rales.  Musculoskeletal:        General: Normal range of motion.  Skin:    General: Skin is warm and dry.     Findings: No rash.  Neurological:     Mental Status: He is alert. Mental status is at baseline.     Motor: No abnormal muscle tone.     Coordination: Coordination normal.  Psychiatric:        Behavior: Behavior normal.         Assessment & Plan:   Problem List Items Addressed This Visit       Cardiovascular  and Mediastinum   Essential hypertension - Primary    Poorly controlled though there may be some aspect of whitecoat hypertension Meds were recently increased by nephrology but blood pressure still not controlled today on lisinopril 40 mg, hydrochlorothiazide 25 and amlodipine 10, add hydralazine 25 mg twice daily Encourage patient to get home blood pressure cuff/monitor Close follow-up to recheck -uncontrolled hypertension is contributing towards kidney disease      Relevant Medications   hydrALAZINE (APRESOLINE) 25 MG tablet     Digestive   Gastroesophageal reflux disease    GI symptoms currently well-controlled without need for any daily Pepcid or PPI meds        Endocrine   Hyperlipidemia associated with type 2 diabetes mellitus ([place])    Good compliance with statin, labs done in August showed cholesterol was well-controlled continue same med and dose      Relevant Medications   hydrALAZINE (APRESOLINE) 25 MG tablet   metFORMIN (GLUCOPHAGE) 500 MG tablet   CKD stage 3 due to type 2 diabetes mellitus ([facility])    Last eGFR per nephrology notes was 34 Nov 2023 BP meds and metformin adjusted He did labs this week for nephrology and f/up next week - we requested record of labs and then will order and fill in what else we need  Expect nephrology to do CMP, PTH, vit D, CBC, urine microalbumin/creat ratio  Renal dosing on metformin (reduced) Max ACEI and amlodipine, on farxiga, prior renal US      Relevant Medications   metFORMIN (GLUCOPHAGE) 500 MG tablet   Severe nonproliferative diabetic retinopathy of left eye, with macular edema, associated with type 2 diabetes mellitus ([place])    He follows closely with specialist      Relevant Medications   metFORMIN (GLUCOPHAGE) 500 MG tablet   Secondary hyperparathyroidism ([place])    Per nephrology will review labs when we receive them      Controlled type 2 diabetes mellitus with microalbuminuria, without long-term current use of insulin  ([place])  Lab Results  Component Value Date   HGBA1C 5.2 08/17/2021  A1c 6 months ago is well-controlled he is on Farxiga and metformin 500 mg once daily He is seeing nephrology we are requesting lab results and these will be abstracted to update the chart and care gaps which are done just need to verify        Relevant Medications   metFORMIN (GLUCOPHAGE) 500 MG tablet   Other Visit Diagnoses     Encounter for medication monitoring       labs orders pending results from nephrolog which we will abstract once I get and review   Prostate cancer [facility])       currently doing RT with Dr. Baruch Gouty        Return for 2 week in office BP/med recheck review labs.   [name], PA-C 02/18/22 9:40 AM

## 2022-02-18 NOTE — Assessment & Plan Note (Signed)
He follows closely with specialist

## 2022-02-18 NOTE — Assessment & Plan Note (Signed)
Last eGFR per nephrology notes was 34 Nov 2023 BP meds and metformin adjusted He did labs this week for nephrology and f/up next week - we requested record of labs and then will order and fill in what else we need  Expect nephrology to do CMP, PTH, vit D, CBC, urine microalbumin/creat ratio  Renal dosing on metformin (reduced) Max ACEI and amlodipine, on farxiga, prior renal US

## 2022-02-18 NOTE — Assessment & Plan Note (Signed)
Per nephrology will review labs when we receive them

## 2022-02-18 NOTE — Assessment & Plan Note (Signed)
Poorly controlled though there may be some aspect of whitecoat hypertension Meds were recently increased by nephrology but blood pressure still not controlled today on lisinopril 40 mg, hydrochlorothiazide 25 and amlodipine 10, add hydralazine 25 mg twice daily Encourage patient to get home blood pressure cuff/monitor Close follow-up to recheck -uncontrolled hypertension is contributing towards kidney disease

## 2022-02-18 NOTE — Assessment & Plan Note (Signed)
Lab Results  Component Value Date   HGBA1C 5.2 08/17/2021  A1c 6 months ago is well-controlled he is on Farxiga and metformin 500 mg once daily He is seeing nephrology we are requesting lab results and these will be abstracted to update the chart and care gaps which are done just need to verify

## 2022-02-18 NOTE — Assessment & Plan Note (Signed)
GI symptoms currently well-controlled without need for any daily Pepcid or PPI meds

## 2022-02-18 NOTE — Assessment & Plan Note (Signed)
Good compliance with statin, labs done in August showed cholesterol was well-controlled continue same med and dose

## 2022-02-19 ENCOUNTER — Other Ambulatory Visit: Payer: Self-pay

## 2022-02-19 ENCOUNTER — Ambulatory Visit
Admission: RE | Admit: 2022-02-19 | Discharge: 2022-02-19 | Disposition: A | Discharge status: Home / Self Care | Payer: Medicare Other | Source: Ambulatory Visit | Attending: Radiation Oncology | Admitting: Radiation Oncology

## 2022-02-19 DIAGNOSIS — R3129 Other microscopic hematuria: Secondary | ICD-10-CM | POA: Diagnosis not present

## 2022-02-19 DIAGNOSIS — Z79899 Other long term (current) drug therapy: Secondary | ICD-10-CM | POA: Diagnosis not present

## 2022-02-19 DIAGNOSIS — E1122 Type 2 diabetes mellitus with diabetic chronic kidney disease: Secondary | ICD-10-CM | POA: Diagnosis not present

## 2022-02-19 DIAGNOSIS — Z7984 Long term (current) use of oral hypoglycemic drugs: Secondary | ICD-10-CM | POA: Diagnosis not present

## 2022-02-19 DIAGNOSIS — C61 Malignant neoplasm of prostate: Secondary | ICD-10-CM | POA: Diagnosis not present

## 2022-02-19 DIAGNOSIS — I129 Hypertensive chronic kidney disease with stage 1 through stage 4 chronic kidney disease, or unspecified chronic kidney disease: Secondary | ICD-10-CM | POA: Diagnosis not present

## 2022-02-19 DIAGNOSIS — N189 Chronic kidney disease, unspecified: Secondary | ICD-10-CM | POA: Diagnosis not present

## 2022-02-19 DIAGNOSIS — Z191 Hormone sensitive malignancy status: Secondary | ICD-10-CM | POA: Diagnosis not present

## 2022-02-19 DIAGNOSIS — Z510 Encounter for antineoplastic radiation therapy: Secondary | ICD-10-CM | POA: Diagnosis not present

## 2022-02-19 DIAGNOSIS — Z809 Family history of malignant neoplasm, unspecified: Secondary | ICD-10-CM | POA: Diagnosis not present

## 2022-02-19 DIAGNOSIS — D631 Anemia in chronic kidney disease: Secondary | ICD-10-CM | POA: Diagnosis not present

## 2022-02-19 DIAGNOSIS — E785 Hyperlipidemia, unspecified: Secondary | ICD-10-CM | POA: Diagnosis not present

## 2022-02-19 LAB — RAD ONC ARIA SESSION SUMMARY
Course Elapsed Days: 15
Plan Fractions Treated to Date: 12
Plan Prescribed Dose Per Fraction: 2 Gy
Plan Total Fractions Prescribed: 40
Plan Total Prescribed Dose: 80 Gy
Reference Point Dosage Given to Date: 24 Gy
Reference Point Session Dosage Given: 2 Gy
Session Number: 12

## 2022-02-22 ENCOUNTER — Other Ambulatory Visit: Payer: Self-pay

## 2022-02-22 ENCOUNTER — Ambulatory Visit
Admission: RE | Admit: 2022-02-22 | Discharge: 2022-02-22 | Disposition: A | Discharge status: Home / Self Care | Payer: Medicare Other | Source: Ambulatory Visit | Attending: Radiation Oncology | Admitting: Radiation Oncology

## 2022-02-22 DIAGNOSIS — E1122 Type 2 diabetes mellitus with diabetic chronic kidney disease: Secondary | ICD-10-CM | POA: Diagnosis not present

## 2022-02-22 DIAGNOSIS — R3129 Other microscopic hematuria: Secondary | ICD-10-CM | POA: Diagnosis not present

## 2022-02-22 DIAGNOSIS — Z510 Encounter for antineoplastic radiation therapy: Secondary | ICD-10-CM | POA: Diagnosis not present

## 2022-02-22 DIAGNOSIS — I129 Hypertensive chronic kidney disease with stage 1 through stage 4 chronic kidney disease, or unspecified chronic kidney disease: Secondary | ICD-10-CM | POA: Diagnosis not present

## 2022-02-22 DIAGNOSIS — Z809 Family history of malignant neoplasm, unspecified: Secondary | ICD-10-CM | POA: Diagnosis not present

## 2022-02-22 DIAGNOSIS — Z191 Hormone sensitive malignancy status: Secondary | ICD-10-CM | POA: Diagnosis not present

## 2022-02-22 DIAGNOSIS — Z79899 Other long term (current) drug therapy: Secondary | ICD-10-CM | POA: Diagnosis not present

## 2022-02-22 DIAGNOSIS — C61 Malignant neoplasm of prostate: Secondary | ICD-10-CM | POA: Diagnosis not present

## 2022-02-22 DIAGNOSIS — Z7984 Long term (current) use of oral hypoglycemic drugs: Secondary | ICD-10-CM | POA: Diagnosis not present

## 2022-02-22 DIAGNOSIS — E785 Hyperlipidemia, unspecified: Secondary | ICD-10-CM | POA: Diagnosis not present

## 2022-02-22 DIAGNOSIS — D631 Anemia in chronic kidney disease: Secondary | ICD-10-CM | POA: Diagnosis not present

## 2022-02-22 DIAGNOSIS — N189 Chronic kidney disease, unspecified: Secondary | ICD-10-CM | POA: Diagnosis not present

## 2022-02-22 LAB — RAD ONC ARIA SESSION SUMMARY
Course Elapsed Days: 18
Plan Fractions Treated to Date: 13
Plan Prescribed Dose Per Fraction: 2 Gy
Plan Total Fractions Prescribed: 40
Plan Total Prescribed Dose: 80 Gy
Reference Point Dosage Given to Date: 26 Gy
Reference Point Session Dosage Given: 2 Gy
Session Number: 13

## 2022-02-23 ENCOUNTER — Ambulatory Visit
Admission: RE | Admit: 2022-02-23 | Discharge: 2022-02-23 | Disposition: A | Discharge status: Home / Self Care | Payer: Medicare Other | Source: Ambulatory Visit | Attending: Radiation Oncology | Admitting: Radiation Oncology

## 2022-02-23 ENCOUNTER — Other Ambulatory Visit: Payer: Self-pay

## 2022-02-23 DIAGNOSIS — R3129 Other microscopic hematuria: Secondary | ICD-10-CM | POA: Diagnosis not present

## 2022-02-23 DIAGNOSIS — E1122 Type 2 diabetes mellitus with diabetic chronic kidney disease: Secondary | ICD-10-CM | POA: Diagnosis not present

## 2022-02-23 DIAGNOSIS — Z79899 Other long term (current) drug therapy: Secondary | ICD-10-CM | POA: Diagnosis not present

## 2022-02-23 DIAGNOSIS — N3289 Other specified disorders of bladder: Secondary | ICD-10-CM | POA: Diagnosis not present

## 2022-02-23 DIAGNOSIS — Z7984 Long term (current) use of oral hypoglycemic drugs: Secondary | ICD-10-CM | POA: Diagnosis not present

## 2022-02-23 DIAGNOSIS — I129 Hypertensive chronic kidney disease with stage 1 through stage 4 chronic kidney disease, or unspecified chronic kidney disease: Secondary | ICD-10-CM | POA: Diagnosis not present

## 2022-02-23 DIAGNOSIS — E1129 Type 2 diabetes mellitus with other diabetic kidney complication: Secondary | ICD-10-CM | POA: Diagnosis not present

## 2022-02-23 DIAGNOSIS — D631 Anemia in chronic kidney disease: Secondary | ICD-10-CM | POA: Diagnosis not present

## 2022-02-23 DIAGNOSIS — N189 Chronic kidney disease, unspecified: Secondary | ICD-10-CM | POA: Diagnosis not present

## 2022-02-23 DIAGNOSIS — Z809 Family history of malignant neoplasm, unspecified: Secondary | ICD-10-CM | POA: Diagnosis not present

## 2022-02-23 DIAGNOSIS — C61 Malignant neoplasm of prostate: Secondary | ICD-10-CM | POA: Diagnosis not present

## 2022-02-23 DIAGNOSIS — Z191 Hormone sensitive malignancy status: Secondary | ICD-10-CM | POA: Diagnosis not present

## 2022-02-23 DIAGNOSIS — D649 Anemia, unspecified: Secondary | ICD-10-CM | POA: Diagnosis not present

## 2022-02-23 DIAGNOSIS — N183 Chronic kidney disease, stage 3 unspecified: Secondary | ICD-10-CM | POA: Diagnosis not present

## 2022-02-23 DIAGNOSIS — Z510 Encounter for antineoplastic radiation therapy: Secondary | ICD-10-CM | POA: Diagnosis not present

## 2022-02-23 DIAGNOSIS — E785 Hyperlipidemia, unspecified: Secondary | ICD-10-CM | POA: Diagnosis not present

## 2022-02-23 LAB — RAD ONC ARIA SESSION SUMMARY
Course Elapsed Days: 19
Plan Fractions Treated to Date: 14
Plan Prescribed Dose Per Fraction: 2 Gy
Plan Total Fractions Prescribed: 40
Plan Total Prescribed Dose: 80 Gy
Reference Point Dosage Given to Date: 28 Gy
Reference Point Session Dosage Given: 2 Gy
Session Number: 14

## 2022-02-24 ENCOUNTER — Ambulatory Visit
Admission: RE | Admit: 2022-02-24 | Discharge: 2022-02-24 | Disposition: A | Discharge status: Home / Self Care | Payer: Medicare Other | Source: Ambulatory Visit | Attending: Radiation Oncology | Admitting: Radiation Oncology

## 2022-02-24 ENCOUNTER — Other Ambulatory Visit: Payer: Self-pay

## 2022-02-24 DIAGNOSIS — Z510 Encounter for antineoplastic radiation therapy: Secondary | ICD-10-CM | POA: Diagnosis not present

## 2022-02-24 DIAGNOSIS — Z809 Family history of malignant neoplasm, unspecified: Secondary | ICD-10-CM | POA: Diagnosis not present

## 2022-02-24 DIAGNOSIS — I129 Hypertensive chronic kidney disease with stage 1 through stage 4 chronic kidney disease, or unspecified chronic kidney disease: Secondary | ICD-10-CM | POA: Diagnosis not present

## 2022-02-24 DIAGNOSIS — E1122 Type 2 diabetes mellitus with diabetic chronic kidney disease: Secondary | ICD-10-CM | POA: Diagnosis not present

## 2022-02-24 DIAGNOSIS — Z7984 Long term (current) use of oral hypoglycemic drugs: Secondary | ICD-10-CM | POA: Diagnosis not present

## 2022-02-24 DIAGNOSIS — C61 Malignant neoplasm of prostate: Secondary | ICD-10-CM | POA: Diagnosis not present

## 2022-02-24 DIAGNOSIS — Z191 Hormone sensitive malignancy status: Secondary | ICD-10-CM | POA: Diagnosis not present

## 2022-02-24 DIAGNOSIS — Z79899 Other long term (current) drug therapy: Secondary | ICD-10-CM | POA: Diagnosis not present

## 2022-02-24 DIAGNOSIS — D631 Anemia in chronic kidney disease: Secondary | ICD-10-CM | POA: Diagnosis not present

## 2022-02-24 DIAGNOSIS — E785 Hyperlipidemia, unspecified: Secondary | ICD-10-CM | POA: Diagnosis not present

## 2022-02-24 DIAGNOSIS — N189 Chronic kidney disease, unspecified: Secondary | ICD-10-CM | POA: Diagnosis not present

## 2022-02-24 DIAGNOSIS — R3129 Other microscopic hematuria: Secondary | ICD-10-CM | POA: Diagnosis not present

## 2022-02-24 LAB — RAD ONC ARIA SESSION SUMMARY
Course Elapsed Days: 20
Plan Fractions Treated to Date: 15
Plan Prescribed Dose Per Fraction: 2 Gy
Plan Total Fractions Prescribed: 40
Plan Total Prescribed Dose: 80 Gy
Reference Point Dosage Given to Date: 30 Gy
Reference Point Session Dosage Given: 2 Gy
Session Number: 15

## 2022-02-25 ENCOUNTER — Other Ambulatory Visit: Payer: Self-pay

## 2022-02-25 ENCOUNTER — Inpatient Hospital Stay: Payer: Medicare Other

## 2022-02-25 ENCOUNTER — Ambulatory Visit
Admission: RE | Admit: 2022-02-25 | Discharge: 2022-02-25 | Disposition: A | Discharge status: Home / Self Care | Payer: Medicare Other | Source: Ambulatory Visit | Attending: Radiation Oncology | Admitting: Radiation Oncology

## 2022-02-25 DIAGNOSIS — E1122 Type 2 diabetes mellitus with diabetic chronic kidney disease: Secondary | ICD-10-CM | POA: Diagnosis not present

## 2022-02-25 DIAGNOSIS — I129 Hypertensive chronic kidney disease with stage 1 through stage 4 chronic kidney disease, or unspecified chronic kidney disease: Secondary | ICD-10-CM | POA: Diagnosis not present

## 2022-02-25 DIAGNOSIS — C61 Malignant neoplasm of prostate: Secondary | ICD-10-CM | POA: Diagnosis not present

## 2022-02-25 DIAGNOSIS — E785 Hyperlipidemia, unspecified: Secondary | ICD-10-CM | POA: Diagnosis not present

## 2022-02-25 DIAGNOSIS — Z79899 Other long term (current) drug therapy: Secondary | ICD-10-CM | POA: Diagnosis not present

## 2022-02-25 DIAGNOSIS — Z7984 Long term (current) use of oral hypoglycemic drugs: Secondary | ICD-10-CM | POA: Diagnosis not present

## 2022-02-25 DIAGNOSIS — Z510 Encounter for antineoplastic radiation therapy: Secondary | ICD-10-CM | POA: Diagnosis not present

## 2022-02-25 DIAGNOSIS — Z809 Family history of malignant neoplasm, unspecified: Secondary | ICD-10-CM | POA: Diagnosis not present

## 2022-02-25 DIAGNOSIS — N189 Chronic kidney disease, unspecified: Secondary | ICD-10-CM | POA: Diagnosis not present

## 2022-02-25 DIAGNOSIS — R3129 Other microscopic hematuria: Secondary | ICD-10-CM | POA: Diagnosis not present

## 2022-02-25 DIAGNOSIS — D631 Anemia in chronic kidney disease: Secondary | ICD-10-CM | POA: Diagnosis not present

## 2022-02-25 DIAGNOSIS — Z191 Hormone sensitive malignancy status: Secondary | ICD-10-CM | POA: Diagnosis not present

## 2022-02-25 LAB — RAD ONC ARIA SESSION SUMMARY
Course Elapsed Days: 21
Plan Fractions Treated to Date: 16
Plan Prescribed Dose Per Fraction: 2 Gy
Plan Total Fractions Prescribed: 40
Plan Total Prescribed Dose: 80 Gy
Reference Point Dosage Given to Date: 32 Gy
Reference Point Session Dosage Given: 2 Gy
Session Number: 16

## 2022-02-26 ENCOUNTER — Ambulatory Visit
Admission: RE | Admit: 2022-02-26 | Discharge: 2022-02-26 | Disposition: A | Discharge status: Home / Self Care | Payer: Medicare Other | Source: Ambulatory Visit | Attending: Radiation Oncology | Admitting: Radiation Oncology

## 2022-02-26 ENCOUNTER — Other Ambulatory Visit: Payer: Self-pay

## 2022-02-26 DIAGNOSIS — Z191 Hormone sensitive malignancy status: Secondary | ICD-10-CM | POA: Diagnosis not present

## 2022-02-26 DIAGNOSIS — E785 Hyperlipidemia, unspecified: Secondary | ICD-10-CM | POA: Diagnosis not present

## 2022-02-26 DIAGNOSIS — Z809 Family history of malignant neoplasm, unspecified: Secondary | ICD-10-CM | POA: Diagnosis not present

## 2022-02-26 DIAGNOSIS — N189 Chronic kidney disease, unspecified: Secondary | ICD-10-CM | POA: Diagnosis not present

## 2022-02-26 DIAGNOSIS — R3129 Other microscopic hematuria: Secondary | ICD-10-CM | POA: Diagnosis not present

## 2022-02-26 DIAGNOSIS — D631 Anemia in chronic kidney disease: Secondary | ICD-10-CM | POA: Diagnosis not present

## 2022-02-26 DIAGNOSIS — Z510 Encounter for antineoplastic radiation therapy: Secondary | ICD-10-CM | POA: Diagnosis not present

## 2022-02-26 DIAGNOSIS — E1122 Type 2 diabetes mellitus with diabetic chronic kidney disease: Secondary | ICD-10-CM | POA: Diagnosis not present

## 2022-02-26 DIAGNOSIS — C61 Malignant neoplasm of prostate: Secondary | ICD-10-CM | POA: Diagnosis not present

## 2022-02-26 DIAGNOSIS — Z7984 Long term (current) use of oral hypoglycemic drugs: Secondary | ICD-10-CM | POA: Diagnosis not present

## 2022-02-26 DIAGNOSIS — Z79899 Other long term (current) drug therapy: Secondary | ICD-10-CM | POA: Diagnosis not present

## 2022-02-26 DIAGNOSIS — I129 Hypertensive chronic kidney disease with stage 1 through stage 4 chronic kidney disease, or unspecified chronic kidney disease: Secondary | ICD-10-CM | POA: Diagnosis not present

## 2022-02-26 LAB — RAD ONC ARIA SESSION SUMMARY
Course Elapsed Days: 22
Plan Fractions Treated to Date: 17
Plan Prescribed Dose Per Fraction: 2 Gy
Plan Total Fractions Prescribed: 40
Plan Total Prescribed Dose: 80 Gy
Reference Point Dosage Given to Date: 34 Gy
Reference Point Session Dosage Given: 2 Gy
Session Number: 17

## 2022-03-01 ENCOUNTER — Ambulatory Visit
Admission: RE | Admit: 2022-03-01 | Discharge: 2022-03-01 | Disposition: A | Discharge status: Home / Self Care | Payer: Medicare Other | Source: Ambulatory Visit | Attending: Radiation Oncology | Admitting: Radiation Oncology

## 2022-03-01 ENCOUNTER — Other Ambulatory Visit: Payer: Self-pay

## 2022-03-01 DIAGNOSIS — I129 Hypertensive chronic kidney disease with stage 1 through stage 4 chronic kidney disease, or unspecified chronic kidney disease: Secondary | ICD-10-CM | POA: Diagnosis not present

## 2022-03-01 DIAGNOSIS — Z510 Encounter for antineoplastic radiation therapy: Secondary | ICD-10-CM | POA: Diagnosis not present

## 2022-03-01 DIAGNOSIS — Z191 Hormone sensitive malignancy status: Secondary | ICD-10-CM | POA: Diagnosis not present

## 2022-03-01 DIAGNOSIS — E1122 Type 2 diabetes mellitus with diabetic chronic kidney disease: Secondary | ICD-10-CM | POA: Diagnosis not present

## 2022-03-01 DIAGNOSIS — R3129 Other microscopic hematuria: Secondary | ICD-10-CM | POA: Diagnosis not present

## 2022-03-01 DIAGNOSIS — Z809 Family history of malignant neoplasm, unspecified: Secondary | ICD-10-CM | POA: Diagnosis not present

## 2022-03-01 DIAGNOSIS — Z7984 Long term (current) use of oral hypoglycemic drugs: Secondary | ICD-10-CM | POA: Diagnosis not present

## 2022-03-01 DIAGNOSIS — N189 Chronic kidney disease, unspecified: Secondary | ICD-10-CM | POA: Diagnosis not present

## 2022-03-01 DIAGNOSIS — C61 Malignant neoplasm of prostate: Secondary | ICD-10-CM | POA: Diagnosis not present

## 2022-03-01 DIAGNOSIS — Z79899 Other long term (current) drug therapy: Secondary | ICD-10-CM | POA: Diagnosis not present

## 2022-03-01 DIAGNOSIS — D631 Anemia in chronic kidney disease: Secondary | ICD-10-CM | POA: Diagnosis not present

## 2022-03-01 DIAGNOSIS — E785 Hyperlipidemia, unspecified: Secondary | ICD-10-CM | POA: Diagnosis not present

## 2022-03-01 LAB — RAD ONC ARIA SESSION SUMMARY
Course Elapsed Days: 25
Plan Fractions Treated to Date: 18
Plan Prescribed Dose Per Fraction: 2 Gy
Plan Total Fractions Prescribed: 40
Plan Total Prescribed Dose: 80 Gy
Reference Point Dosage Given to Date: 36 Gy
Reference Point Session Dosage Given: 2 Gy
Session Number: 18

## 2022-03-02 ENCOUNTER — Other Ambulatory Visit: Payer: Self-pay

## 2022-03-02 ENCOUNTER — Ambulatory Visit
Admission: RE | Admit: 2022-03-02 | Discharge: 2022-03-02 | Disposition: A | Discharge status: Home / Self Care | Payer: Medicare Other | Source: Ambulatory Visit | Attending: Radiation Oncology | Admitting: Radiation Oncology

## 2022-03-02 DIAGNOSIS — C61 Malignant neoplasm of prostate: Secondary | ICD-10-CM | POA: Diagnosis not present

## 2022-03-02 DIAGNOSIS — E1122 Type 2 diabetes mellitus with diabetic chronic kidney disease: Secondary | ICD-10-CM | POA: Diagnosis not present

## 2022-03-02 DIAGNOSIS — Z191 Hormone sensitive malignancy status: Secondary | ICD-10-CM | POA: Diagnosis not present

## 2022-03-02 DIAGNOSIS — R3129 Other microscopic hematuria: Secondary | ICD-10-CM | POA: Diagnosis not present

## 2022-03-02 DIAGNOSIS — I129 Hypertensive chronic kidney disease with stage 1 through stage 4 chronic kidney disease, or unspecified chronic kidney disease: Secondary | ICD-10-CM | POA: Diagnosis not present

## 2022-03-02 DIAGNOSIS — Z809 Family history of malignant neoplasm, unspecified: Secondary | ICD-10-CM | POA: Diagnosis not present

## 2022-03-02 DIAGNOSIS — N189 Chronic kidney disease, unspecified: Secondary | ICD-10-CM | POA: Diagnosis not present

## 2022-03-02 DIAGNOSIS — Z79899 Other long term (current) drug therapy: Secondary | ICD-10-CM | POA: Diagnosis not present

## 2022-03-02 DIAGNOSIS — Z7984 Long term (current) use of oral hypoglycemic drugs: Secondary | ICD-10-CM | POA: Diagnosis not present

## 2022-03-02 DIAGNOSIS — E785 Hyperlipidemia, unspecified: Secondary | ICD-10-CM | POA: Diagnosis not present

## 2022-03-02 DIAGNOSIS — D631 Anemia in chronic kidney disease: Secondary | ICD-10-CM | POA: Diagnosis not present

## 2022-03-02 DIAGNOSIS — Z510 Encounter for antineoplastic radiation therapy: Secondary | ICD-10-CM | POA: Diagnosis not present

## 2022-03-02 LAB — RAD ONC ARIA SESSION SUMMARY
Course Elapsed Days: 26
Plan Fractions Treated to Date: 19
Plan Prescribed Dose Per Fraction: 2 Gy
Plan Total Fractions Prescribed: 40
Plan Total Prescribed Dose: 80 Gy
Reference Point Dosage Given to Date: 38 Gy
Reference Point Session Dosage Given: 2 Gy
Session Number: 19

## 2022-03-03 ENCOUNTER — Other Ambulatory Visit: Payer: Self-pay

## 2022-03-03 ENCOUNTER — Ambulatory Visit
Admission: RE | Admit: 2022-03-03 | Discharge: 2022-03-03 | Disposition: A | Discharge status: Home / Self Care | Payer: Medicare Other | Source: Ambulatory Visit | Attending: Radiation Oncology | Admitting: Radiation Oncology

## 2022-03-03 DIAGNOSIS — E785 Hyperlipidemia, unspecified: Secondary | ICD-10-CM | POA: Diagnosis not present

## 2022-03-03 DIAGNOSIS — E1122 Type 2 diabetes mellitus with diabetic chronic kidney disease: Secondary | ICD-10-CM | POA: Diagnosis not present

## 2022-03-03 DIAGNOSIS — Z191 Hormone sensitive malignancy status: Secondary | ICD-10-CM | POA: Diagnosis not present

## 2022-03-03 DIAGNOSIS — Z510 Encounter for antineoplastic radiation therapy: Secondary | ICD-10-CM | POA: Diagnosis not present

## 2022-03-03 DIAGNOSIS — D631 Anemia in chronic kidney disease: Secondary | ICD-10-CM | POA: Diagnosis not present

## 2022-03-03 DIAGNOSIS — Z7984 Long term (current) use of oral hypoglycemic drugs: Secondary | ICD-10-CM | POA: Diagnosis not present

## 2022-03-03 DIAGNOSIS — Z79899 Other long term (current) drug therapy: Secondary | ICD-10-CM | POA: Diagnosis not present

## 2022-03-03 DIAGNOSIS — Z809 Family history of malignant neoplasm, unspecified: Secondary | ICD-10-CM | POA: Diagnosis not present

## 2022-03-03 DIAGNOSIS — R3129 Other microscopic hematuria: Secondary | ICD-10-CM | POA: Diagnosis not present

## 2022-03-03 DIAGNOSIS — C61 Malignant neoplasm of prostate: Secondary | ICD-10-CM | POA: Diagnosis not present

## 2022-03-03 DIAGNOSIS — N189 Chronic kidney disease, unspecified: Secondary | ICD-10-CM | POA: Diagnosis not present

## 2022-03-03 DIAGNOSIS — I129 Hypertensive chronic kidney disease with stage 1 through stage 4 chronic kidney disease, or unspecified chronic kidney disease: Secondary | ICD-10-CM | POA: Diagnosis not present

## 2022-03-03 LAB — RAD ONC ARIA SESSION SUMMARY
Course Elapsed Days: 27
Plan Fractions Treated to Date: 20
Plan Prescribed Dose Per Fraction: 2 Gy
Plan Total Fractions Prescribed: 40
Plan Total Prescribed Dose: 80 Gy
Reference Point Dosage Given to Date: 40 Gy
Reference Point Session Dosage Given: 2 Gy
Session Number: 20

## 2022-03-04 ENCOUNTER — Other Ambulatory Visit: Payer: Self-pay

## 2022-03-04 ENCOUNTER — Ambulatory Visit (INDEPENDENT_AMBULATORY_CARE_PROVIDER_SITE_OTHER): Payer: Medicare Other | Admitting: Family Medicine

## 2022-03-04 ENCOUNTER — Encounter: Payer: Self-pay | Admitting: Family Medicine

## 2022-03-04 ENCOUNTER — Ambulatory Visit
Admission: RE | Admit: 2022-03-04 | Discharge: 2022-03-04 | Disposition: A | Discharge status: Home / Self Care | Payer: Medicare Other | Source: Ambulatory Visit | Attending: Radiation Oncology | Admitting: Radiation Oncology

## 2022-03-04 VITALS — BP 132/70 | HR 96 | Temp 98.1°F | Resp 16 | Ht 64.0 in | Wt 176.8 lb

## 2022-03-04 DIAGNOSIS — E785 Hyperlipidemia, unspecified: Secondary | ICD-10-CM

## 2022-03-04 DIAGNOSIS — N1832 Chronic kidney disease, stage 3b: Secondary | ICD-10-CM

## 2022-03-04 DIAGNOSIS — Z5181 Encounter for therapeutic drug level monitoring: Secondary | ICD-10-CM

## 2022-03-04 DIAGNOSIS — Z79899 Other long term (current) drug therapy: Secondary | ICD-10-CM | POA: Diagnosis not present

## 2022-03-04 DIAGNOSIS — Z510 Encounter for antineoplastic radiation therapy: Secondary | ICD-10-CM | POA: Diagnosis not present

## 2022-03-04 DIAGNOSIS — R3129 Other microscopic hematuria: Secondary | ICD-10-CM | POA: Diagnosis not present

## 2022-03-04 DIAGNOSIS — I10 Essential (primary) hypertension: Secondary | ICD-10-CM | POA: Diagnosis not present

## 2022-03-04 DIAGNOSIS — R809 Proteinuria, unspecified: Secondary | ICD-10-CM | POA: Diagnosis not present

## 2022-03-04 DIAGNOSIS — E1129 Type 2 diabetes mellitus with other diabetic kidney complication: Secondary | ICD-10-CM

## 2022-03-04 DIAGNOSIS — I129 Hypertensive chronic kidney disease with stage 1 through stage 4 chronic kidney disease, or unspecified chronic kidney disease: Secondary | ICD-10-CM | POA: Diagnosis not present

## 2022-03-04 DIAGNOSIS — N189 Chronic kidney disease, unspecified: Secondary | ICD-10-CM | POA: Diagnosis not present

## 2022-03-04 DIAGNOSIS — D631 Anemia in chronic kidney disease: Secondary | ICD-10-CM | POA: Diagnosis not present

## 2022-03-04 DIAGNOSIS — C61 Malignant neoplasm of prostate: Secondary | ICD-10-CM | POA: Diagnosis not present

## 2022-03-04 DIAGNOSIS — E1169 Type 2 diabetes mellitus with other specified complication: Secondary | ICD-10-CM | POA: Diagnosis not present

## 2022-03-04 DIAGNOSIS — Z809 Family history of malignant neoplasm, unspecified: Secondary | ICD-10-CM | POA: Diagnosis not present

## 2022-03-04 DIAGNOSIS — Z191 Hormone sensitive malignancy status: Secondary | ICD-10-CM | POA: Diagnosis not present

## 2022-03-04 DIAGNOSIS — E1122 Type 2 diabetes mellitus with diabetic chronic kidney disease: Secondary | ICD-10-CM | POA: Diagnosis not present

## 2022-03-04 DIAGNOSIS — N2581 Secondary hyperparathyroidism of renal origin: Secondary | ICD-10-CM | POA: Diagnosis not present

## 2022-03-04 DIAGNOSIS — Z7984 Long term (current) use of oral hypoglycemic drugs: Secondary | ICD-10-CM | POA: Diagnosis not present

## 2022-03-04 LAB — RAD ONC ARIA SESSION SUMMARY
Course Elapsed Days: 28
Plan Fractions Treated to Date: 21
Plan Prescribed Dose Per Fraction: 2 Gy
Plan Total Fractions Prescribed: 40
Plan Total Prescribed Dose: 80 Gy
Reference Point Dosage Given to Date: 42 Gy
Reference Point Session Dosage Given: 2 Gy
Session Number: 21

## 2022-03-04 NOTE — Progress Notes (Signed)
Patient ID: Cameron Glover, male    DOB: Jun 30, 1950, 72 y.o.   MRN: [id number]  PCP: [name], PA-C  Chief Complaint  Patient presents with   Follow-up   Hypertension    Subjective:   Cameron Glover is a 72 y.o. male, presents to clinic with CC of the following:  HPI   Recheck BP - uncontrolled HTN with worsening renal function  Bp meds changed by nephrology Pt reports good BP at other visits and increased here- possibly white coat htn, he has not gotten a home BP cuff  We added hydralazine 2 weeks ago and BP has improved- much better, he's tolerating meds w/o SE BP Readings from Last 3 Encounters:  03/04/22 132/70  02/18/22 (!) 158/84  12/23/21 (!) 150/94     HLD on lipitor 40 - he has tolerated this dose wo SE or concerns Lab Results  Component Value Date   CHOL 83 08/17/2021   HDL 32 (L) 08/17/2021   LDLCALC 34 08/17/2021   TRIG 91 08/17/2021   CHOLHDL 2.6 08/17/2021   A1C was well controlled, decreased GFR pt was advised to decrease metformin dose to 500 mg once daily Lab Results  Component Value Date   HGBA1C 5.2 08/17/2021   Reviewed last labs from 2 weeks ago - and still requesting f/up on last nephro labs    Chemistry      Component Value Date/Time   NA 140 08/17/2021 1423   K 4.5 08/17/2021 1423   CL 107 08/17/2021 1423   CO2 25 08/17/2021 1423   BUN 20 08/17/2021 1423   CREATININE 2.20 (H) 09/16/2021 1602   CREATININE 2.09 (H) 08/17/2021 1423      Component Value Date/Time   CALCIUM 7.9 (L) 08/17/2021 1423   ALKPHOS 77 09/02/2017 1738   AST 13 08/17/2021 1423   ALT 11 08/17/2021 1423   BILITOT 0.4 08/17/2021 1423       Patient Active Problem List   Diagnosis Date Noted   History of prostate cancer 08/17/2021   Grade 3 hypertensive retinopathy, bilateral 04/29/2021   Controlled type 2 diabetes mellitus with microalbuminuria, without long-term current use of insulin ([place]) 03/30/2021   Snores 11/17/2020   Pseudophakia of left eye  10/13/2020   Anemia 08/11/2020   Gastroesophageal reflux disease 08/11/2020   Vitamin B12 deficiency 08/11/2020   Vitamin D deficiency 08/11/2020   Elevated parathyroid hormone 08/11/2020   Secondary hyperparathyroidism ([place]) 08/11/2020   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   Severe nonproliferative diabetic retinopathy of left eye, with macular edema, associated with type 2 diabetes mellitus ([place]) 05/14/2019   Severe nonproliferative diabetic retinopathy of right eye, with macular edema, associated with type 2 diabetes mellitus ([place]) 05/14/2019   Retinal hemorrhage of right eye 05/14/2019   Retinal hemorrhage of left eye 05/14/2019   Nuclear sclerotic cataract of right eye 05/14/2019   LVH (left ventricular hypertrophy) 03/23/2019   Hyperlipidemia associated with type 2 diabetes mellitus ([place]) 09/22/2018   CKD stage 3 due to type 2 diabetes mellitus ([place]) 09/22/2018   Essential hypertension 04/15/2018      Current Outpatient Medications:    amLODipine (NORVASC) 10 MG tablet, TAKE 1 TABLET BY MOUTH EVERY DAY, Disp: 90 tablet, Rfl: 1   atorvastatin (LIPITOR) 40 MG tablet, TAKE 1 TABLET BY MOUTH EVERY DAY, Disp: 90 tablet, Rfl: 3   FARXIGA 10 MG TABS tablet, Take 10 mg by mouth daily., Disp: , Rfl:    hydrALAZINE (APRESOLINE) 25 MG tablet,  Take 1 tablet (25 mg total) by mouth 3 (three) times daily., Disp: 90 tablet, Rfl: 2   lisinopril-hydrochlorothiazide (ZESTORETIC) 20-12.5 MG tablet, Take 2 tablets by mouth daily., Disp: 180 tablet, Rfl: 1   metFORMIN (GLUCOPHAGE) 500 MG tablet, Take 1 tablet (500 mg total) by mouth daily with breakfast., Disp: 90 tablet, Rfl: 1   Oyster Shell Calcium 500 MG TABS, Take 1 tablet by mouth 2 (two) times daily., Disp: , Rfl:    pantoprazole (PROTONIX) 40 MG tablet, TAKE 1 TABLET BY MOUTH EVERY DAY, Disp: 90 tablet, Rfl: 3   No Known Allergies   Social History   Tobacco Use   Smoking status: Never    Passive exposure: Never   Smokeless  tobacco: Never  Vaping Use   Vaping Use: Never used  Substance Use Topics   Alcohol use: Never   Drug use: Never      Chart Review Today: I personally reviewed active problem list, medication list, allergies, family history, social history, health maintenance, notes from last encounter, lab results, imaging with the patient/caregiver today.   Review of Systems ROS neg other than specified in HPI     Objective:   Vitals:   03/04/22 0859 03/04/22 0918  BP: (!) 154/88 132/70  Pulse: 96   Resp: 16   Temp: 98.1 F (36.7 C)   TempSrc: Oral   SpO2: 100%   Weight: 176 lb 12.8 oz (80.2 kg)   Height: '5\' 4"'$  (1.626 m)     Body mass index is 30.35 kg/m.  Physical Exam Vitals and nursing note reviewed.  Constitutional:      General: He is not in acute distress.    Appearance: Normal appearance. He is normal weight. He is not ill-appearing, toxic-appearing or diaphoretic.  HENT:     Head: Normocephalic and atraumatic.     Right Ear: External ear normal.     Left Ear: External ear normal.     Nose: Nose normal.  Eyes:     General:        Right eye: No discharge.        Left eye: No discharge.     Conjunctiva/sclera: Conjunctivae normal.  Cardiovascular:     Rate and Rhythm: Normal rate and regular rhythm.     Heart sounds: Normal heart sounds.  Pulmonary:     Effort: Pulmonary effort is normal.     Breath sounds: Normal breath sounds.  Neurological:     Mental Status: He is alert. Mental status is at baseline.  Psychiatric:        Mood and Affect: Mood normal.        Behavior: Behavior normal.      Results for orders placed or performed in visit on 03/03/22  Rad Onc [facility] Session Summary  Result Value Ref Range   Course ID [email or web address]    Course Intent Curative    Course Start Date 01/25/2022 11:41 AM    Session Number 20    Course First Treatment Date 02/04/2022 11:25 AM    Course Last Treatment Date 03/03/2022 11:36 AM    Course Elapsed Days 27    Reference Point  ID Prostate DP    Reference Point Dosage Given to Date 40 Gy   Reference Point Session Dosage Given 2 Gy   Plan ID Prostate    Plan Name Prostate    Plan Fractions Treated to Date 24    Plan Total Fractions Prescribed 40    Plan Prescribed Dose Per  Fraction 2 Gy   Plan Total Prescribed Dose 80.000000 Gy   Plan Primary Reference Point Prostate DP        Assessment & Plan:   1. Essential hypertension Improved HTN control with addition of hydralazine Continue hydralazine 25 mg TID, amlodipine and other meds per nephrology Low salt encouraged - labs today - we requested labs 2 weeks ago but OV note was received without any lab results - COMPLETE METABOLIC PANEL WITH GFR  2. Controlled type 2 diabetes mellitus with microalbuminuria, without long-term current use of insulin ([place]) Will do labs today - see last OV note  Metformin has been decreased due to renal function, on 500 mg once daily - COMPLETE METABOLIC PANEL WITH GFR - Microalbumin / creatinine urine ratio - Hemoglobin A1c  3. Hyperlipidemia associated with type 2 diabetes mellitus ([place]) On lipitor 40 with good compliance and tolerance- LDL at goal 6 months ago, con't same meds and we'll recheck next aug Lab Results  Component Value Date   CHOL 83 08/17/2021   HDL 32 (L) 08/17/2021   LDLCALC 34 08/17/2021   TRIG 91 08/17/2021   CHOLHDL 2.6 08/17/2021    - COMPLETE METABOLIC PANEL WITH GFR  4. Encounter for medication monitoring  - COMPLETE METABOLIC PANEL WITH GFR - CBC with Differential/Platelet - Microalbumin / creatinine urine ratio - Hemoglobin A1c  5. Secondary hyperparathyroidism ([place]) Per nephrology - decreasing GFR, will review nephro labs when we get them instead of duplicating workup  6. Stage 3b chronic kidney disease ([place]) Unfortunately renal function decreasing, med doses have been adjusted per nephro - he is on farxiga - COMPLETE METABOLIC PANEL WITH GFR  Return in about 6 months (around  09/02/2022).     [name], PA-C 03/04/22 9:12 AM

## 2022-03-05 ENCOUNTER — Other Ambulatory Visit: Payer: Self-pay

## 2022-03-05 ENCOUNTER — Ambulatory Visit
Admission: RE | Admit: 2022-03-05 | Discharge: 2022-03-05 | Disposition: A | Discharge status: Home / Self Care | Payer: Medicare Other | Source: Ambulatory Visit | Attending: Radiation Oncology | Admitting: Radiation Oncology

## 2022-03-05 DIAGNOSIS — C61 Malignant neoplasm of prostate: Secondary | ICD-10-CM | POA: Diagnosis not present

## 2022-03-05 DIAGNOSIS — R3129 Other microscopic hematuria: Secondary | ICD-10-CM | POA: Insufficient documentation

## 2022-03-05 DIAGNOSIS — I129 Hypertensive chronic kidney disease with stage 1 through stage 4 chronic kidney disease, or unspecified chronic kidney disease: Secondary | ICD-10-CM | POA: Insufficient documentation

## 2022-03-05 DIAGNOSIS — E1122 Type 2 diabetes mellitus with diabetic chronic kidney disease: Secondary | ICD-10-CM | POA: Insufficient documentation

## 2022-03-05 DIAGNOSIS — Z79899 Other long term (current) drug therapy: Secondary | ICD-10-CM | POA: Insufficient documentation

## 2022-03-05 DIAGNOSIS — N189 Chronic kidney disease, unspecified: Secondary | ICD-10-CM | POA: Diagnosis not present

## 2022-03-05 DIAGNOSIS — Z809 Family history of malignant neoplasm, unspecified: Secondary | ICD-10-CM | POA: Diagnosis not present

## 2022-03-05 DIAGNOSIS — Z7984 Long term (current) use of oral hypoglycemic drugs: Secondary | ICD-10-CM | POA: Diagnosis not present

## 2022-03-05 DIAGNOSIS — Z191 Hormone sensitive malignancy status: Secondary | ICD-10-CM | POA: Diagnosis not present

## 2022-03-05 DIAGNOSIS — D631 Anemia in chronic kidney disease: Secondary | ICD-10-CM | POA: Insufficient documentation

## 2022-03-05 DIAGNOSIS — Z510 Encounter for antineoplastic radiation therapy: Secondary | ICD-10-CM | POA: Diagnosis not present

## 2022-03-05 DIAGNOSIS — E785 Hyperlipidemia, unspecified: Secondary | ICD-10-CM | POA: Diagnosis not present

## 2022-03-05 LAB — RAD ONC ARIA SESSION SUMMARY
Course Elapsed Days: 29
Plan Fractions Treated to Date: 22
Plan Prescribed Dose Per Fraction: 2 Gy
Plan Total Fractions Prescribed: 40
Plan Total Prescribed Dose: 80 Gy
Reference Point Dosage Given to Date: 44 Gy
Reference Point Session Dosage Given: 2 Gy
Session Number: 22

## 2022-03-05 LAB — COMPLETE METABOLIC PANEL WITH GFR
AG Ratio: 1.3 (calc) (ref 1.0–2.5)
ALT: 18 U/L (ref 9–46)
AST: 13 U/L (ref 10–35)
Albumin: 4.2 g/dL (ref 3.6–5.1)
Alkaline phosphatase (APISO): 96 U/L (ref 35–144)
BUN/Creatinine Ratio: 16 (calc) (ref 6–22)
BUN: 37 mg/dL — ABNORMAL HIGH (ref 7–25)
CO2: 22 mmol/L (ref 20–32)
Calcium: 9 mg/dL (ref 8.6–10.3)
Chloride: 100 mmol/L (ref 98–110)
Creat: 2.29 mg/dL — ABNORMAL HIGH (ref 0.70–1.35)
Globulin: 3.3 g/dL (calc) (ref 1.9–3.7)
Glucose, Bld: 343 mg/dL — ABNORMAL HIGH (ref 65–99)
Potassium: 5.2 mmol/L (ref 3.5–5.3)
Sodium: 133 mmol/L — ABNORMAL LOW (ref 135–146)
Total Bilirubin: 0.4 mg/dL (ref 0.2–1.2)
Total Protein: 7.5 g/dL (ref 6.1–8.1)
eGFR: 30 mL/min/{1.73_m2} — ABNORMAL LOW (ref >=60)

## 2022-03-05 LAB — HEMOGLOBIN A1C
Hgb A1c MFr Bld: 8.3 % of total Hgb — ABNORMAL HIGH (ref <5.7)
Mean Plasma Glucose: 192 mg/dL
eAG (mmol/L): 10.6 mmol/L

## 2022-03-05 LAB — CBC WITH DIFFERENTIAL/PLATELET
Absolute Monocytes: 256 cells/uL (ref 200–950)
Basophils Absolute: 38 cells/uL (ref 0–200)
Basophils Relative: 1.2 %
Eosinophils Absolute: 138 cells/uL (ref 15–500)
Eosinophils Relative: 4.3 %
HCT: 31.8 % — ABNORMAL LOW (ref 38.5–50.0)
Hemoglobin: 10.6 g/dL — ABNORMAL LOW (ref 13.2–17.1)
Lymphs Abs: 378 cells/uL — ABNORMAL LOW (ref 850–3900)
MCH: 31.5 pg (ref 27.0–33.0)
MCHC: 33.3 g/dL (ref 32.0–36.0)
MCV: 94.4 fL (ref 80.0–100.0)
MPV: 10.7 fL (ref 7.5–12.5)
Monocytes Relative: 8 %
Neutro Abs: 2390 cells/uL (ref 1500–7800)
Neutrophils Relative %: 74.7 %
Platelets: 243 10*3/uL (ref 140–400)
RBC: 3.37 10*6/uL — ABNORMAL LOW (ref 4.20–5.80)
RDW: 11.8 % (ref 11.0–15.0)
Total Lymphocyte: 11.8 %
WBC: 3.2 10*3/uL — ABNORMAL LOW (ref 3.8–10.8)

## 2022-03-05 LAB — MICROALBUMIN / CREATININE URINE RATIO
Creatinine, Urine: 47 mg/dL (ref 20–320)
Microalb Creat Ratio: 945 mcg/mg creat — ABNORMAL HIGH (ref <30)
Microalb, Ur: 44.4 mg/dL

## 2022-03-08 ENCOUNTER — Ambulatory Visit
Admission: RE | Admit: 2022-03-08 | Discharge: 2022-03-08 | Disposition: A | Discharge status: Home / Self Care | Payer: Medicare Other | Source: Ambulatory Visit | Attending: Radiation Oncology | Admitting: Radiation Oncology

## 2022-03-08 ENCOUNTER — Other Ambulatory Visit: Payer: Self-pay

## 2022-03-08 DIAGNOSIS — Z79899 Other long term (current) drug therapy: Secondary | ICD-10-CM | POA: Diagnosis not present

## 2022-03-08 DIAGNOSIS — Z510 Encounter for antineoplastic radiation therapy: Secondary | ICD-10-CM | POA: Diagnosis not present

## 2022-03-08 DIAGNOSIS — Z7984 Long term (current) use of oral hypoglycemic drugs: Secondary | ICD-10-CM | POA: Diagnosis not present

## 2022-03-08 DIAGNOSIS — N189 Chronic kidney disease, unspecified: Secondary | ICD-10-CM | POA: Diagnosis not present

## 2022-03-08 DIAGNOSIS — Z809 Family history of malignant neoplasm, unspecified: Secondary | ICD-10-CM | POA: Diagnosis not present

## 2022-03-08 DIAGNOSIS — I129 Hypertensive chronic kidney disease with stage 1 through stage 4 chronic kidney disease, or unspecified chronic kidney disease: Secondary | ICD-10-CM | POA: Diagnosis not present

## 2022-03-08 DIAGNOSIS — C61 Malignant neoplasm of prostate: Secondary | ICD-10-CM | POA: Diagnosis not present

## 2022-03-08 DIAGNOSIS — E1122 Type 2 diabetes mellitus with diabetic chronic kidney disease: Secondary | ICD-10-CM | POA: Diagnosis not present

## 2022-03-08 DIAGNOSIS — E785 Hyperlipidemia, unspecified: Secondary | ICD-10-CM | POA: Diagnosis not present

## 2022-03-08 DIAGNOSIS — R3129 Other microscopic hematuria: Secondary | ICD-10-CM | POA: Diagnosis not present

## 2022-03-08 DIAGNOSIS — Z191 Hormone sensitive malignancy status: Secondary | ICD-10-CM | POA: Diagnosis not present

## 2022-03-08 DIAGNOSIS — D631 Anemia in chronic kidney disease: Secondary | ICD-10-CM | POA: Diagnosis not present

## 2022-03-08 LAB — RAD ONC ARIA SESSION SUMMARY
Course Elapsed Days: 32
Plan Fractions Treated to Date: 23
Plan Prescribed Dose Per Fraction: 2 Gy
Plan Total Fractions Prescribed: 40
Plan Total Prescribed Dose: 80 Gy
Reference Point Dosage Given to Date: 46 Gy
Reference Point Session Dosage Given: 2 Gy
Session Number: 23

## 2022-03-09 ENCOUNTER — Other Ambulatory Visit: Payer: Self-pay

## 2022-03-09 ENCOUNTER — Ambulatory Visit
Admission: RE | Admit: 2022-03-09 | Discharge: 2022-03-09 | Disposition: A | Discharge status: Home / Self Care | Payer: Medicare Other | Source: Ambulatory Visit | Attending: Radiation Oncology | Admitting: Radiation Oncology

## 2022-03-09 DIAGNOSIS — Z79899 Other long term (current) drug therapy: Secondary | ICD-10-CM | POA: Diagnosis not present

## 2022-03-09 DIAGNOSIS — Z809 Family history of malignant neoplasm, unspecified: Secondary | ICD-10-CM | POA: Diagnosis not present

## 2022-03-09 DIAGNOSIS — I129 Hypertensive chronic kidney disease with stage 1 through stage 4 chronic kidney disease, or unspecified chronic kidney disease: Secondary | ICD-10-CM | POA: Diagnosis not present

## 2022-03-09 DIAGNOSIS — Z191 Hormone sensitive malignancy status: Secondary | ICD-10-CM | POA: Diagnosis not present

## 2022-03-09 DIAGNOSIS — E1122 Type 2 diabetes mellitus with diabetic chronic kidney disease: Secondary | ICD-10-CM | POA: Diagnosis not present

## 2022-03-09 DIAGNOSIS — D631 Anemia in chronic kidney disease: Secondary | ICD-10-CM | POA: Diagnosis not present

## 2022-03-09 DIAGNOSIS — R3129 Other microscopic hematuria: Secondary | ICD-10-CM | POA: Diagnosis not present

## 2022-03-09 DIAGNOSIS — N189 Chronic kidney disease, unspecified: Secondary | ICD-10-CM | POA: Diagnosis not present

## 2022-03-09 DIAGNOSIS — C61 Malignant neoplasm of prostate: Secondary | ICD-10-CM | POA: Diagnosis not present

## 2022-03-09 DIAGNOSIS — E785 Hyperlipidemia, unspecified: Secondary | ICD-10-CM | POA: Diagnosis not present

## 2022-03-09 DIAGNOSIS — Z7984 Long term (current) use of oral hypoglycemic drugs: Secondary | ICD-10-CM | POA: Diagnosis not present

## 2022-03-09 DIAGNOSIS — Z510 Encounter for antineoplastic radiation therapy: Secondary | ICD-10-CM | POA: Diagnosis not present

## 2022-03-09 LAB — RAD ONC ARIA SESSION SUMMARY
Course Elapsed Days: 33
Plan Fractions Treated to Date: 24
Plan Prescribed Dose Per Fraction: 2 Gy
Plan Total Fractions Prescribed: 40
Plan Total Prescribed Dose: 80 Gy
Reference Point Dosage Given to Date: 48 Gy
Reference Point Session Dosage Given: 2 Gy
Session Number: 24

## 2022-03-10 ENCOUNTER — Ambulatory Visit
Admission: RE | Admit: 2022-03-10 | Discharge: 2022-03-10 | Disposition: A | Discharge status: Home / Self Care | Payer: Medicare Other | Source: Ambulatory Visit | Attending: Radiation Oncology | Admitting: Radiation Oncology

## 2022-03-10 ENCOUNTER — Other Ambulatory Visit: Payer: Self-pay

## 2022-03-10 DIAGNOSIS — N189 Chronic kidney disease, unspecified: Secondary | ICD-10-CM | POA: Diagnosis not present

## 2022-03-10 DIAGNOSIS — Z79899 Other long term (current) drug therapy: Secondary | ICD-10-CM | POA: Diagnosis not present

## 2022-03-10 DIAGNOSIS — R3129 Other microscopic hematuria: Secondary | ICD-10-CM | POA: Diagnosis not present

## 2022-03-10 DIAGNOSIS — Z7984 Long term (current) use of oral hypoglycemic drugs: Secondary | ICD-10-CM | POA: Diagnosis not present

## 2022-03-10 DIAGNOSIS — Z510 Encounter for antineoplastic radiation therapy: Secondary | ICD-10-CM | POA: Diagnosis not present

## 2022-03-10 DIAGNOSIS — D631 Anemia in chronic kidney disease: Secondary | ICD-10-CM | POA: Diagnosis not present

## 2022-03-10 DIAGNOSIS — E785 Hyperlipidemia, unspecified: Secondary | ICD-10-CM | POA: Diagnosis not present

## 2022-03-10 DIAGNOSIS — Z191 Hormone sensitive malignancy status: Secondary | ICD-10-CM | POA: Diagnosis not present

## 2022-03-10 DIAGNOSIS — Z809 Family history of malignant neoplasm, unspecified: Secondary | ICD-10-CM | POA: Diagnosis not present

## 2022-03-10 DIAGNOSIS — E1122 Type 2 diabetes mellitus with diabetic chronic kidney disease: Secondary | ICD-10-CM | POA: Diagnosis not present

## 2022-03-10 DIAGNOSIS — I129 Hypertensive chronic kidney disease with stage 1 through stage 4 chronic kidney disease, or unspecified chronic kidney disease: Secondary | ICD-10-CM | POA: Diagnosis not present

## 2022-03-10 DIAGNOSIS — C61 Malignant neoplasm of prostate: Secondary | ICD-10-CM | POA: Diagnosis not present

## 2022-03-10 LAB — RAD ONC ARIA SESSION SUMMARY
Course Elapsed Days: 34
Plan Fractions Treated to Date: 25
Plan Prescribed Dose Per Fraction: 2 Gy
Plan Total Fractions Prescribed: 40
Plan Total Prescribed Dose: 80 Gy
Reference Point Dosage Given to Date: 50 Gy
Reference Point Session Dosage Given: 2 Gy
Session Number: 25

## 2022-03-11 ENCOUNTER — Inpatient Hospital Stay: Payer: Medicare Other

## 2022-03-11 ENCOUNTER — Other Ambulatory Visit: Payer: Self-pay

## 2022-03-11 ENCOUNTER — Ambulatory Visit
Admission: RE | Admit: 2022-03-11 | Discharge: 2022-03-11 | Disposition: A | Discharge status: Home / Self Care | Payer: Medicare Other | Source: Ambulatory Visit | Attending: Radiation Oncology | Admitting: Radiation Oncology

## 2022-03-11 DIAGNOSIS — N189 Chronic kidney disease, unspecified: Secondary | ICD-10-CM | POA: Diagnosis not present

## 2022-03-11 DIAGNOSIS — Z79899 Other long term (current) drug therapy: Secondary | ICD-10-CM | POA: Diagnosis not present

## 2022-03-11 DIAGNOSIS — E1122 Type 2 diabetes mellitus with diabetic chronic kidney disease: Secondary | ICD-10-CM | POA: Diagnosis not present

## 2022-03-11 DIAGNOSIS — E785 Hyperlipidemia, unspecified: Secondary | ICD-10-CM | POA: Diagnosis not present

## 2022-03-11 DIAGNOSIS — R3129 Other microscopic hematuria: Secondary | ICD-10-CM | POA: Diagnosis not present

## 2022-03-11 DIAGNOSIS — Z809 Family history of malignant neoplasm, unspecified: Secondary | ICD-10-CM | POA: Diagnosis not present

## 2022-03-11 DIAGNOSIS — C61 Malignant neoplasm of prostate: Secondary | ICD-10-CM | POA: Diagnosis not present

## 2022-03-11 DIAGNOSIS — Z7984 Long term (current) use of oral hypoglycemic drugs: Secondary | ICD-10-CM | POA: Diagnosis not present

## 2022-03-11 DIAGNOSIS — D631 Anemia in chronic kidney disease: Secondary | ICD-10-CM | POA: Diagnosis not present

## 2022-03-11 DIAGNOSIS — Z510 Encounter for antineoplastic radiation therapy: Secondary | ICD-10-CM | POA: Diagnosis not present

## 2022-03-11 DIAGNOSIS — I129 Hypertensive chronic kidney disease with stage 1 through stage 4 chronic kidney disease, or unspecified chronic kidney disease: Secondary | ICD-10-CM | POA: Diagnosis not present

## 2022-03-11 DIAGNOSIS — Z191 Hormone sensitive malignancy status: Secondary | ICD-10-CM | POA: Diagnosis not present

## 2022-03-11 LAB — RAD ONC ARIA SESSION SUMMARY
Course Elapsed Days: 35
Plan Fractions Treated to Date: 26
Plan Prescribed Dose Per Fraction: 2 Gy
Plan Total Fractions Prescribed: 40
Plan Total Prescribed Dose: 80 Gy
Reference Point Dosage Given to Date: 52 Gy
Reference Point Session Dosage Given: 2 Gy
Session Number: 26

## 2022-03-11 LAB — CBC
HCT: 30.2 % — ABNORMAL LOW (ref 39.0–52.0)
Hemoglobin: 10.3 g/dL — ABNORMAL LOW (ref 13.0–17.0)
MCH: 31.2 pg (ref 26.0–34.0)
MCHC: 34.1 g/dL (ref 30.0–36.0)
MCV: 91.5 fL (ref 80.0–100.0)
Platelets: 199 10*3/uL (ref 150–400)
RBC: 3.3 MIL/uL — ABNORMAL LOW (ref 4.22–5.81)
RDW: 12.5 % (ref 11.5–15.5)
WBC: 3.5 10*3/uL — ABNORMAL LOW (ref 4.0–10.5)
nRBC: 0 % (ref 0.0–0.2)

## 2022-03-12 ENCOUNTER — Other Ambulatory Visit: Payer: Self-pay

## 2022-03-12 ENCOUNTER — Ambulatory Visit
Admission: RE | Admit: 2022-03-12 | Discharge: 2022-03-12 | Disposition: A | Discharge status: Home / Self Care | Payer: Medicare Other | Source: Ambulatory Visit | Attending: Radiation Oncology | Admitting: Radiation Oncology

## 2022-03-12 DIAGNOSIS — Z79899 Other long term (current) drug therapy: Secondary | ICD-10-CM | POA: Diagnosis not present

## 2022-03-12 DIAGNOSIS — C61 Malignant neoplasm of prostate: Secondary | ICD-10-CM | POA: Diagnosis not present

## 2022-03-12 DIAGNOSIS — I129 Hypertensive chronic kidney disease with stage 1 through stage 4 chronic kidney disease, or unspecified chronic kidney disease: Secondary | ICD-10-CM | POA: Diagnosis not present

## 2022-03-12 DIAGNOSIS — R3129 Other microscopic hematuria: Secondary | ICD-10-CM | POA: Diagnosis not present

## 2022-03-12 DIAGNOSIS — Z510 Encounter for antineoplastic radiation therapy: Secondary | ICD-10-CM | POA: Diagnosis not present

## 2022-03-12 DIAGNOSIS — D631 Anemia in chronic kidney disease: Secondary | ICD-10-CM | POA: Diagnosis not present

## 2022-03-12 DIAGNOSIS — Z191 Hormone sensitive malignancy status: Secondary | ICD-10-CM | POA: Diagnosis not present

## 2022-03-12 DIAGNOSIS — E785 Hyperlipidemia, unspecified: Secondary | ICD-10-CM | POA: Diagnosis not present

## 2022-03-12 DIAGNOSIS — Z809 Family history of malignant neoplasm, unspecified: Secondary | ICD-10-CM | POA: Diagnosis not present

## 2022-03-12 DIAGNOSIS — N189 Chronic kidney disease, unspecified: Secondary | ICD-10-CM | POA: Diagnosis not present

## 2022-03-12 DIAGNOSIS — E1122 Type 2 diabetes mellitus with diabetic chronic kidney disease: Secondary | ICD-10-CM | POA: Diagnosis not present

## 2022-03-12 DIAGNOSIS — Z7984 Long term (current) use of oral hypoglycemic drugs: Secondary | ICD-10-CM | POA: Diagnosis not present

## 2022-03-12 LAB — HM DIABETES FOOT EXAM: HM Diabetic Foot Exam: NORMAL

## 2022-03-12 LAB — RAD ONC ARIA SESSION SUMMARY
Course Elapsed Days: 36
Plan Fractions Treated to Date: 27
Plan Prescribed Dose Per Fraction: 2 Gy
Plan Total Fractions Prescribed: 40
Plan Total Prescribed Dose: 80 Gy
Reference Point Dosage Given to Date: 54 Gy
Reference Point Session Dosage Given: 2 Gy
Session Number: 27

## 2022-03-12 LAB — HEMOGLOBIN A1C: Hemoglobin A1C: 7.8

## 2022-03-15 ENCOUNTER — Ambulatory Visit
Admission: RE | Admit: 2022-03-15 | Discharge: 2022-03-15 | Disposition: A | Discharge status: Home / Self Care | Payer: Medicare Other | Source: Ambulatory Visit | Attending: Radiation Oncology | Admitting: Radiation Oncology

## 2022-03-15 ENCOUNTER — Encounter: Payer: Self-pay | Admitting: Family Medicine

## 2022-03-15 ENCOUNTER — Other Ambulatory Visit: Payer: Self-pay

## 2022-03-15 DIAGNOSIS — Z510 Encounter for antineoplastic radiation therapy: Secondary | ICD-10-CM | POA: Diagnosis not present

## 2022-03-15 DIAGNOSIS — E1122 Type 2 diabetes mellitus with diabetic chronic kidney disease: Secondary | ICD-10-CM | POA: Diagnosis not present

## 2022-03-15 DIAGNOSIS — D631 Anemia in chronic kidney disease: Secondary | ICD-10-CM | POA: Diagnosis not present

## 2022-03-15 DIAGNOSIS — I129 Hypertensive chronic kidney disease with stage 1 through stage 4 chronic kidney disease, or unspecified chronic kidney disease: Secondary | ICD-10-CM | POA: Diagnosis not present

## 2022-03-15 DIAGNOSIS — C61 Malignant neoplasm of prostate: Secondary | ICD-10-CM | POA: Diagnosis not present

## 2022-03-15 DIAGNOSIS — Z7984 Long term (current) use of oral hypoglycemic drugs: Secondary | ICD-10-CM | POA: Diagnosis not present

## 2022-03-15 DIAGNOSIS — Z191 Hormone sensitive malignancy status: Secondary | ICD-10-CM | POA: Diagnosis not present

## 2022-03-15 DIAGNOSIS — Z809 Family history of malignant neoplasm, unspecified: Secondary | ICD-10-CM | POA: Diagnosis not present

## 2022-03-15 DIAGNOSIS — R3129 Other microscopic hematuria: Secondary | ICD-10-CM | POA: Diagnosis not present

## 2022-03-15 DIAGNOSIS — N189 Chronic kidney disease, unspecified: Secondary | ICD-10-CM | POA: Diagnosis not present

## 2022-03-15 DIAGNOSIS — E785 Hyperlipidemia, unspecified: Secondary | ICD-10-CM | POA: Diagnosis not present

## 2022-03-15 DIAGNOSIS — Z79899 Other long term (current) drug therapy: Secondary | ICD-10-CM | POA: Diagnosis not present

## 2022-03-15 LAB — RAD ONC ARIA SESSION SUMMARY
Course Elapsed Days: 39
Plan Fractions Treated to Date: 28
Plan Prescribed Dose Per Fraction: 2 Gy
Plan Total Fractions Prescribed: 40
Plan Total Prescribed Dose: 80 Gy
Reference Point Dosage Given to Date: 56 Gy
Reference Point Session Dosage Given: 2 Gy
Session Number: 28

## 2022-03-16 ENCOUNTER — Ambulatory Visit
Admission: RE | Admit: 2022-03-16 | Discharge: 2022-03-16 | Disposition: A | Discharge status: Home / Self Care | Payer: Medicare Other | Source: Ambulatory Visit | Attending: Radiation Oncology | Admitting: Radiation Oncology

## 2022-03-16 ENCOUNTER — Other Ambulatory Visit: Payer: Self-pay

## 2022-03-16 ENCOUNTER — Encounter: Payer: Self-pay | Admitting: Family Medicine

## 2022-03-16 ENCOUNTER — Ambulatory Visit (INDEPENDENT_AMBULATORY_CARE_PROVIDER_SITE_OTHER): Payer: Medicare Other | Admitting: Family Medicine

## 2022-03-16 VITALS — BP 134/76 | HR 92 | Temp 98.3°F | Resp 16 | Ht 64.0 in | Wt 178.2 lb

## 2022-03-16 DIAGNOSIS — Z6836 Body mass index (BMI) 36.0-36.9, adult: Secondary | ICD-10-CM | POA: Insufficient documentation

## 2022-03-16 DIAGNOSIS — Z6830 Body mass index (BMI) 30.0-30.9, adult: Secondary | ICD-10-CM | POA: Diagnosis not present

## 2022-03-16 DIAGNOSIS — Z79899 Other long term (current) drug therapy: Secondary | ICD-10-CM | POA: Diagnosis not present

## 2022-03-16 DIAGNOSIS — I129 Hypertensive chronic kidney disease with stage 1 through stage 4 chronic kidney disease, or unspecified chronic kidney disease: Secondary | ICD-10-CM | POA: Diagnosis not present

## 2022-03-16 DIAGNOSIS — Z809 Family history of malignant neoplasm, unspecified: Secondary | ICD-10-CM | POA: Diagnosis not present

## 2022-03-16 DIAGNOSIS — E1122 Type 2 diabetes mellitus with diabetic chronic kidney disease: Secondary | ICD-10-CM | POA: Diagnosis not present

## 2022-03-16 DIAGNOSIS — E669 Obesity, unspecified: Secondary | ICD-10-CM | POA: Insufficient documentation

## 2022-03-16 DIAGNOSIS — E785 Hyperlipidemia, unspecified: Secondary | ICD-10-CM | POA: Diagnosis not present

## 2022-03-16 DIAGNOSIS — Z7984 Long term (current) use of oral hypoglycemic drugs: Secondary | ICD-10-CM | POA: Diagnosis not present

## 2022-03-16 DIAGNOSIS — D631 Anemia in chronic kidney disease: Secondary | ICD-10-CM | POA: Diagnosis not present

## 2022-03-16 DIAGNOSIS — E1165 Type 2 diabetes mellitus with hyperglycemia: Secondary | ICD-10-CM | POA: Diagnosis not present

## 2022-03-16 DIAGNOSIS — N1832 Chronic kidney disease, stage 3b: Secondary | ICD-10-CM | POA: Diagnosis not present

## 2022-03-16 DIAGNOSIS — R3129 Other microscopic hematuria: Secondary | ICD-10-CM | POA: Diagnosis not present

## 2022-03-16 DIAGNOSIS — Z191 Hormone sensitive malignancy status: Secondary | ICD-10-CM | POA: Diagnosis not present

## 2022-03-16 DIAGNOSIS — C61 Malignant neoplasm of prostate: Secondary | ICD-10-CM | POA: Diagnosis not present

## 2022-03-16 DIAGNOSIS — Z510 Encounter for antineoplastic radiation therapy: Secondary | ICD-10-CM | POA: Diagnosis not present

## 2022-03-16 DIAGNOSIS — N189 Chronic kidney disease, unspecified: Secondary | ICD-10-CM | POA: Diagnosis not present

## 2022-03-16 LAB — RAD ONC ARIA SESSION SUMMARY
Course Elapsed Days: 40
Plan Fractions Treated to Date: 29
Plan Prescribed Dose Per Fraction: 2 Gy
Plan Total Fractions Prescribed: 40
Plan Total Prescribed Dose: 80 Gy
Reference Point Dosage Given to Date: 58 Gy
Reference Point Session Dosage Given: 2 Gy
Session Number: 29

## 2022-03-16 NOTE — Patient Instructions (Addendum)
  Keep checking blood sugars daily in the morning and working on your diet avoiding a lot of sweets, sugars or carbs.  Try to focus on lean proteins, vegetables, fruits are ok (no fruit juice). Continue metformin 500 mg once a day and farxiga  If your blood sugars in the morning are on average > 180 then please let me know so we can start a new medication to get your sugars controlled   Call your kidney specialist and see if they want to or are willing to see you sooner for recheck with your blood sugar now uncontrolled.

## 2022-03-16 NOTE — Progress Notes (Signed)
Patient ID: Cameron Glover, male    DOB: 1950-10-29, 72 y.o.   MRN: [id number]  PCP: [name], PA-C  Chief Complaint  Patient presents with   Results    Discuss recent labs    Subjective:   Cameron Glover is a 72 y.o. male, presents to clinic with CC of the following:  HPI  Here for lab f/up with recently increased A1C  He has meds and glucometer with him today He has been doing daily morning readings since we told him lab results  3/5 267 3/6 160 3/7 161 3/8 219 3/9 161 3/10 151 3/11 150  3/12 171   Doing radiation tx still for the next two weeks, he denies any Rx for steroids associated with RT or cancer care He drinks juice sometimes or sweet tea but not much Dinner salads, baked chicken Metformin dose reduced recently by nephrology, farxiga added Lab Results  Component Value Date   HGBA1C 8.3 (H) 03/04/2022   HGBA1C 5.2 08/17/2021   HGBA1C 5.5 02/12/2021   HGBA1C 6.1 (H) 08/11/2020   HGBA1C 6.2 (H) 02/11/2020   HGBA1C 6.4 (H) 07/23/2019   Many years ago he had high A1C and some acute illnesses and needed basal insulin for a short period of time and until recently was well controlled and managed only on metformin.  HTN well controlled today Reviewed labs with pt and printed copy given to him     Patient Active Problem List   Diagnosis Date Noted   History of prostate cancer 08/17/2021   Grade 3 hypertensive retinopathy, bilateral 04/29/2021   Controlled type 2 diabetes mellitus with microalbuminuria, without long-term current use of insulin ([place]) 03/30/2021   Snores 11/17/2020   Pseudophakia of left eye 10/13/2020   Anemia 08/11/2020   Gastroesophageal reflux disease 08/11/2020   Vitamin B12 deficiency 08/11/2020   Vitamin D deficiency 08/11/2020   Elevated parathyroid hormone 08/11/2020   Secondary hyperparathyroidism ([place]) 08/11/2020   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   Severe nonproliferative diabetic retinopathy of left eye,  with macular edema, associated with type 2 diabetes mellitus ([place]) 05/14/2019   Severe nonproliferative diabetic retinopathy of right eye, with macular edema, associated with type 2 diabetes mellitus ([place]) 05/14/2019   Retinal hemorrhage of right eye 05/14/2019   Retinal hemorrhage of left eye 05/14/2019   Nuclear sclerotic cataract of right eye 05/14/2019   LVH (left ventricular hypertrophy) 03/23/2019   Hyperlipidemia associated with type 2 diabetes mellitus ([place]) 09/22/2018   CKD stage 3 due to type 2 diabetes mellitus ([place]) 09/22/2018   Essential hypertension 04/15/2018      Current Outpatient Medications:    amLODipine (NORVASC) 10 MG tablet, TAKE 1 TABLET BY MOUTH EVERY DAY, Disp: 90 tablet, Rfl: 1   atorvastatin (LIPITOR) 40 MG tablet, TAKE 1 TABLET BY MOUTH EVERY DAY, Disp: 90 tablet, Rfl: 3   FARXIGA 10 MG TABS tablet, Take 10 mg by mouth daily., Disp: , Rfl:    hydrALAZINE (APRESOLINE) 25 MG tablet, Take 1 tablet (25 mg total) by mouth 3 (three) times daily., Disp: 90 tablet, Rfl: 2   lisinopril-hydrochlorothiazide (ZESTORETIC) 20-12.5 MG tablet, Take 2 tablets by mouth daily., Disp: 180 tablet, Rfl: 1   metFORMIN (GLUCOPHAGE) 500 MG tablet, Take 1 tablet (500 mg total) by mouth daily with breakfast., Disp: 90 tablet, Rfl: 1   Oyster Shell Calcium 500 MG TABS, Take 1 tablet by mouth 2 (two) times daily., Disp: , Rfl:    pantoprazole (PROTONIX) 40  MG tablet, TAKE 1 TABLET BY MOUTH EVERY DAY, Disp: 90 tablet, Rfl: 3   No Known Allergies   Social History   Tobacco Use   Smoking status: Never    Passive exposure: Never   Smokeless tobacco: Never  Vaping Use   Vaping Use: Never used  Substance Use Topics   Alcohol use: Never   Drug use: Never      Chart Review Today: I personally reviewed active problem list, medication list, allergies, family history, social history, health maintenance, notes from last encounter, lab results, imaging with the patient/caregiver  today.   Review of Systems  Constitutional: Negative.   HENT: Negative.    Eyes: Negative.   Respiratory: Negative.    Cardiovascular: Negative.   Gastrointestinal: Negative.   Endocrine: Negative.   Genitourinary: Negative.   Musculoskeletal: Negative.   Skin: Negative.   Allergic/Immunologic: Negative.   Neurological: Negative.   Hematological: Negative.   Psychiatric/Behavioral: Negative.    All other systems reviewed and are negative.      Objective:   Vitals:   03/16/22 0931 03/16/22 1006  BP: (!) 140/78 (!) 146/82  Pulse: 92   Resp: 16   Temp: 98.3 F (36.8 C)   TempSrc: Oral   SpO2: 97%   Weight: 178 lb 3.2 oz (80.8 kg)   Height: '5\' 4"'$  (1.626 m)     Body mass index is 30.59 kg/m.  Physical Exam Vitals and nursing note reviewed.  Constitutional:      Appearance: Normal appearance. He is well-developed.  HENT:     Head: Normocephalic and atraumatic.     Nose: Nose normal.  Eyes:     General:        Right eye: No discharge.        Left eye: No discharge.     Conjunctiva/sclera: Conjunctivae normal.  Neck:     Trachea: No tracheal deviation.  Cardiovascular:     Rate and Rhythm: Normal rate and regular rhythm.  Pulmonary:     Effort: Pulmonary effort is normal. No respiratory distress.     Breath sounds: No stridor.  Musculoskeletal:        General: Normal range of motion.  Skin:    General: Skin is warm and dry.     Findings: No rash.  Neurological:     Mental Status: He is alert.     Motor: No abnormal muscle tone.     Coordination: Coordination normal.  Psychiatric:        Behavior: Behavior normal.           Assessment & Plan:   Pt here to review abnormal labs recently obtained - most notably DM uncontrolled, when previously well controlled for years  1. Uncontrolled diabetes mellitus with hyperglycemia, without long-term current use of insulin ([place]) Plan to continue metformin 500 mg once daily and farxiga, work on Cab[employer], consult with DM/nutrition Continue daily morning/fasting glucose and f/up in 1 month with meter - sooner if CBG's over 200  - Referral to Nutrition and Diabetes Services  2. Stage 3b chronic kidney disease ([place]) Labs printed so pt could show to nephrology and see if they need sooner f/up UACR on last nephrology records was >1271 ---> 1152 --- > our most recent lab 945 eGFR 30, last with nephro 32 Continue lisinopril-hctz 40-25, norvasc 10, recently added hydralazine, on farxiga - Referral to Nutrition and Diabetes Services  3. Class 1 obesity with serious comorbidity and body mass index (BMI) of  30.0 to 30.9 in adult, unspecified obesity type  - Referral to Nutrition and Diabetes Services  HTN - improved, continue hydralazine 25 mg TID, norvasc 10, lisinopril-HCTZ 40-25 per nephro   Return for 1 month virtual with sugar meter uncontrolled DM.    [name], PA-C 03/16/22 10:18 AM

## 2022-03-17 ENCOUNTER — Other Ambulatory Visit: Payer: Self-pay

## 2022-03-17 ENCOUNTER — Ambulatory Visit
Admission: RE | Admit: 2022-03-17 | Discharge: 2022-03-17 | Disposition: A | Discharge status: Home / Self Care | Payer: Medicare Other | Source: Ambulatory Visit | Attending: Radiation Oncology | Admitting: Radiation Oncology

## 2022-03-17 DIAGNOSIS — N189 Chronic kidney disease, unspecified: Secondary | ICD-10-CM | POA: Diagnosis not present

## 2022-03-17 DIAGNOSIS — E785 Hyperlipidemia, unspecified: Secondary | ICD-10-CM | POA: Diagnosis not present

## 2022-03-17 DIAGNOSIS — I129 Hypertensive chronic kidney disease with stage 1 through stage 4 chronic kidney disease, or unspecified chronic kidney disease: Secondary | ICD-10-CM | POA: Diagnosis not present

## 2022-03-17 DIAGNOSIS — Z191 Hormone sensitive malignancy status: Secondary | ICD-10-CM | POA: Diagnosis not present

## 2022-03-17 DIAGNOSIS — Z7984 Long term (current) use of oral hypoglycemic drugs: Secondary | ICD-10-CM | POA: Diagnosis not present

## 2022-03-17 DIAGNOSIS — Z79899 Other long term (current) drug therapy: Secondary | ICD-10-CM | POA: Diagnosis not present

## 2022-03-17 DIAGNOSIS — E1122 Type 2 diabetes mellitus with diabetic chronic kidney disease: Secondary | ICD-10-CM | POA: Diagnosis not present

## 2022-03-17 DIAGNOSIS — D631 Anemia in chronic kidney disease: Secondary | ICD-10-CM | POA: Diagnosis not present

## 2022-03-17 DIAGNOSIS — C61 Malignant neoplasm of prostate: Secondary | ICD-10-CM | POA: Diagnosis not present

## 2022-03-17 DIAGNOSIS — Z809 Family history of malignant neoplasm, unspecified: Secondary | ICD-10-CM | POA: Diagnosis not present

## 2022-03-17 DIAGNOSIS — Z510 Encounter for antineoplastic radiation therapy: Secondary | ICD-10-CM | POA: Diagnosis not present

## 2022-03-17 DIAGNOSIS — R3129 Other microscopic hematuria: Secondary | ICD-10-CM | POA: Diagnosis not present

## 2022-03-17 LAB — RAD ONC ARIA SESSION SUMMARY
Course Elapsed Days: 41
Plan Fractions Treated to Date: 30
Plan Prescribed Dose Per Fraction: 2 Gy
Plan Total Fractions Prescribed: 40
Plan Total Prescribed Dose: 80 Gy
Reference Point Dosage Given to Date: 60 Gy
Reference Point Session Dosage Given: 2 Gy
Session Number: 30

## 2022-03-18 ENCOUNTER — Ambulatory Visit
Admission: RE | Admit: 2022-03-18 | Discharge: 2022-03-18 | Disposition: A | Discharge status: Home / Self Care | Payer: Medicare Other | Source: Ambulatory Visit | Attending: Radiation Oncology | Admitting: Radiation Oncology

## 2022-03-18 ENCOUNTER — Other Ambulatory Visit: Payer: Self-pay

## 2022-03-18 DIAGNOSIS — Z7984 Long term (current) use of oral hypoglycemic drugs: Secondary | ICD-10-CM | POA: Diagnosis not present

## 2022-03-18 DIAGNOSIS — N189 Chronic kidney disease, unspecified: Secondary | ICD-10-CM | POA: Diagnosis not present

## 2022-03-18 DIAGNOSIS — R3129 Other microscopic hematuria: Secondary | ICD-10-CM | POA: Diagnosis not present

## 2022-03-18 DIAGNOSIS — Z191 Hormone sensitive malignancy status: Secondary | ICD-10-CM | POA: Diagnosis not present

## 2022-03-18 DIAGNOSIS — Z809 Family history of malignant neoplasm, unspecified: Secondary | ICD-10-CM | POA: Diagnosis not present

## 2022-03-18 DIAGNOSIS — C61 Malignant neoplasm of prostate: Secondary | ICD-10-CM | POA: Diagnosis not present

## 2022-03-18 DIAGNOSIS — H43821 Vitreomacular adhesion, right eye: Secondary | ICD-10-CM | POA: Diagnosis not present

## 2022-03-18 DIAGNOSIS — I129 Hypertensive chronic kidney disease with stage 1 through stage 4 chronic kidney disease, or unspecified chronic kidney disease: Secondary | ICD-10-CM | POA: Diagnosis not present

## 2022-03-18 DIAGNOSIS — Z79899 Other long term (current) drug therapy: Secondary | ICD-10-CM | POA: Diagnosis not present

## 2022-03-18 DIAGNOSIS — Z510 Encounter for antineoplastic radiation therapy: Secondary | ICD-10-CM | POA: Diagnosis not present

## 2022-03-18 DIAGNOSIS — D631 Anemia in chronic kidney disease: Secondary | ICD-10-CM | POA: Diagnosis not present

## 2022-03-18 DIAGNOSIS — E785 Hyperlipidemia, unspecified: Secondary | ICD-10-CM | POA: Diagnosis not present

## 2022-03-18 DIAGNOSIS — H35033 Hypertensive retinopathy, bilateral: Secondary | ICD-10-CM | POA: Diagnosis not present

## 2022-03-18 DIAGNOSIS — E1122 Type 2 diabetes mellitus with diabetic chronic kidney disease: Secondary | ICD-10-CM | POA: Diagnosis not present

## 2022-03-18 DIAGNOSIS — E113413 Type 2 diabetes mellitus with severe nonproliferative diabetic retinopathy with macular edema, bilateral: Secondary | ICD-10-CM | POA: Diagnosis not present

## 2022-03-18 LAB — RAD ONC ARIA SESSION SUMMARY
Course Elapsed Days: 42
Plan Fractions Treated to Date: 31
Plan Prescribed Dose Per Fraction: 2 Gy
Plan Total Fractions Prescribed: 40
Plan Total Prescribed Dose: 80 Gy
Reference Point Dosage Given to Date: 62 Gy
Reference Point Session Dosage Given: 2 Gy
Session Number: 31

## 2022-03-19 ENCOUNTER — Other Ambulatory Visit: Payer: Self-pay

## 2022-03-19 ENCOUNTER — Ambulatory Visit
Admission: RE | Admit: 2022-03-19 | Discharge: 2022-03-19 | Disposition: A | Discharge status: Home / Self Care | Payer: Medicare Other | Source: Ambulatory Visit | Attending: Radiation Oncology | Admitting: Radiation Oncology

## 2022-03-19 DIAGNOSIS — Z510 Encounter for antineoplastic radiation therapy: Secondary | ICD-10-CM | POA: Diagnosis not present

## 2022-03-19 DIAGNOSIS — E1122 Type 2 diabetes mellitus with diabetic chronic kidney disease: Secondary | ICD-10-CM | POA: Diagnosis not present

## 2022-03-19 DIAGNOSIS — R3129 Other microscopic hematuria: Secondary | ICD-10-CM | POA: Diagnosis not present

## 2022-03-19 DIAGNOSIS — E785 Hyperlipidemia, unspecified: Secondary | ICD-10-CM | POA: Diagnosis not present

## 2022-03-19 DIAGNOSIS — C61 Malignant neoplasm of prostate: Secondary | ICD-10-CM | POA: Diagnosis not present

## 2022-03-19 DIAGNOSIS — Z191 Hormone sensitive malignancy status: Secondary | ICD-10-CM | POA: Diagnosis not present

## 2022-03-19 DIAGNOSIS — I129 Hypertensive chronic kidney disease with stage 1 through stage 4 chronic kidney disease, or unspecified chronic kidney disease: Secondary | ICD-10-CM | POA: Diagnosis not present

## 2022-03-19 DIAGNOSIS — Z79899 Other long term (current) drug therapy: Secondary | ICD-10-CM | POA: Diagnosis not present

## 2022-03-19 DIAGNOSIS — Z809 Family history of malignant neoplasm, unspecified: Secondary | ICD-10-CM | POA: Diagnosis not present

## 2022-03-19 DIAGNOSIS — N189 Chronic kidney disease, unspecified: Secondary | ICD-10-CM | POA: Diagnosis not present

## 2022-03-19 DIAGNOSIS — Z7984 Long term (current) use of oral hypoglycemic drugs: Secondary | ICD-10-CM | POA: Diagnosis not present

## 2022-03-19 DIAGNOSIS — D631 Anemia in chronic kidney disease: Secondary | ICD-10-CM | POA: Diagnosis not present

## 2022-03-19 LAB — RAD ONC ARIA SESSION SUMMARY
Course Elapsed Days: 43
Plan Fractions Treated to Date: 32
Plan Prescribed Dose Per Fraction: 2 Gy
Plan Total Fractions Prescribed: 40
Plan Total Prescribed Dose: 80 Gy
Reference Point Dosage Given to Date: 64 Gy
Reference Point Session Dosage Given: 2 Gy
Session Number: 32

## 2022-03-22 ENCOUNTER — Ambulatory Visit
Admission: RE | Admit: 2022-03-22 | Discharge: 2022-03-22 | Disposition: A | Discharge status: Home / Self Care | Payer: Medicare Other | Source: Ambulatory Visit | Attending: Radiation Oncology | Admitting: Radiation Oncology

## 2022-03-22 ENCOUNTER — Other Ambulatory Visit: Payer: Self-pay

## 2022-03-22 DIAGNOSIS — D631 Anemia in chronic kidney disease: Secondary | ICD-10-CM | POA: Diagnosis not present

## 2022-03-22 DIAGNOSIS — N189 Chronic kidney disease, unspecified: Secondary | ICD-10-CM | POA: Diagnosis not present

## 2022-03-22 DIAGNOSIS — Z510 Encounter for antineoplastic radiation therapy: Secondary | ICD-10-CM | POA: Diagnosis not present

## 2022-03-22 DIAGNOSIS — R3129 Other microscopic hematuria: Secondary | ICD-10-CM | POA: Diagnosis not present

## 2022-03-22 DIAGNOSIS — E785 Hyperlipidemia, unspecified: Secondary | ICD-10-CM | POA: Diagnosis not present

## 2022-03-22 DIAGNOSIS — E1122 Type 2 diabetes mellitus with diabetic chronic kidney disease: Secondary | ICD-10-CM | POA: Diagnosis not present

## 2022-03-22 DIAGNOSIS — Z79899 Other long term (current) drug therapy: Secondary | ICD-10-CM | POA: Diagnosis not present

## 2022-03-22 DIAGNOSIS — Z7984 Long term (current) use of oral hypoglycemic drugs: Secondary | ICD-10-CM | POA: Diagnosis not present

## 2022-03-22 DIAGNOSIS — I129 Hypertensive chronic kidney disease with stage 1 through stage 4 chronic kidney disease, or unspecified chronic kidney disease: Secondary | ICD-10-CM | POA: Diagnosis not present

## 2022-03-22 DIAGNOSIS — C61 Malignant neoplasm of prostate: Secondary | ICD-10-CM | POA: Diagnosis not present

## 2022-03-22 DIAGNOSIS — Z191 Hormone sensitive malignancy status: Secondary | ICD-10-CM | POA: Diagnosis not present

## 2022-03-22 DIAGNOSIS — Z809 Family history of malignant neoplasm, unspecified: Secondary | ICD-10-CM | POA: Diagnosis not present

## 2022-03-22 LAB — RAD ONC ARIA SESSION SUMMARY
Course Elapsed Days: 46
Plan Fractions Treated to Date: 33
Plan Prescribed Dose Per Fraction: 2 Gy
Plan Total Fractions Prescribed: 40
Plan Total Prescribed Dose: 80 Gy
Reference Point Dosage Given to Date: 66 Gy
Reference Point Session Dosage Given: 2 Gy
Session Number: 33

## 2022-03-23 ENCOUNTER — Other Ambulatory Visit: Payer: Self-pay

## 2022-03-23 ENCOUNTER — Ambulatory Visit
Admission: RE | Admit: 2022-03-23 | Discharge: 2022-03-23 | Disposition: A | Discharge status: Home / Self Care | Payer: Medicare Other | Source: Ambulatory Visit | Attending: Radiation Oncology | Admitting: Radiation Oncology

## 2022-03-23 DIAGNOSIS — E1122 Type 2 diabetes mellitus with diabetic chronic kidney disease: Secondary | ICD-10-CM | POA: Diagnosis not present

## 2022-03-23 DIAGNOSIS — C61 Malignant neoplasm of prostate: Secondary | ICD-10-CM | POA: Diagnosis not present

## 2022-03-23 DIAGNOSIS — Z510 Encounter for antineoplastic radiation therapy: Secondary | ICD-10-CM | POA: Diagnosis not present

## 2022-03-23 DIAGNOSIS — N189 Chronic kidney disease, unspecified: Secondary | ICD-10-CM | POA: Diagnosis not present

## 2022-03-23 DIAGNOSIS — Z79899 Other long term (current) drug therapy: Secondary | ICD-10-CM | POA: Diagnosis not present

## 2022-03-23 DIAGNOSIS — Z191 Hormone sensitive malignancy status: Secondary | ICD-10-CM | POA: Diagnosis not present

## 2022-03-23 DIAGNOSIS — Z7984 Long term (current) use of oral hypoglycemic drugs: Secondary | ICD-10-CM | POA: Diagnosis not present

## 2022-03-23 DIAGNOSIS — I129 Hypertensive chronic kidney disease with stage 1 through stage 4 chronic kidney disease, or unspecified chronic kidney disease: Secondary | ICD-10-CM | POA: Diagnosis not present

## 2022-03-23 DIAGNOSIS — D631 Anemia in chronic kidney disease: Secondary | ICD-10-CM | POA: Diagnosis not present

## 2022-03-23 DIAGNOSIS — Z809 Family history of malignant neoplasm, unspecified: Secondary | ICD-10-CM | POA: Diagnosis not present

## 2022-03-23 DIAGNOSIS — R3129 Other microscopic hematuria: Secondary | ICD-10-CM | POA: Diagnosis not present

## 2022-03-23 DIAGNOSIS — E785 Hyperlipidemia, unspecified: Secondary | ICD-10-CM | POA: Diagnosis not present

## 2022-03-23 LAB — RAD ONC ARIA SESSION SUMMARY
Course Elapsed Days: 47
Plan Fractions Treated to Date: 34
Plan Prescribed Dose Per Fraction: 2 Gy
Plan Total Fractions Prescribed: 40
Plan Total Prescribed Dose: 80 Gy
Reference Point Dosage Given to Date: 68 Gy
Reference Point Session Dosage Given: 2 Gy
Session Number: 34

## 2022-03-24 ENCOUNTER — Other Ambulatory Visit: Payer: Self-pay

## 2022-03-24 ENCOUNTER — Ambulatory Visit
Admission: RE | Admit: 2022-03-24 | Discharge: 2022-03-24 | Disposition: A | Discharge status: Home / Self Care | Payer: Medicare Other | Source: Ambulatory Visit | Attending: Radiation Oncology | Admitting: Radiation Oncology

## 2022-03-24 DIAGNOSIS — Z191 Hormone sensitive malignancy status: Secondary | ICD-10-CM | POA: Diagnosis not present

## 2022-03-24 DIAGNOSIS — Z809 Family history of malignant neoplasm, unspecified: Secondary | ICD-10-CM | POA: Diagnosis not present

## 2022-03-24 DIAGNOSIS — Z7984 Long term (current) use of oral hypoglycemic drugs: Secondary | ICD-10-CM | POA: Diagnosis not present

## 2022-03-24 DIAGNOSIS — Z79899 Other long term (current) drug therapy: Secondary | ICD-10-CM | POA: Diagnosis not present

## 2022-03-24 DIAGNOSIS — I129 Hypertensive chronic kidney disease with stage 1 through stage 4 chronic kidney disease, or unspecified chronic kidney disease: Secondary | ICD-10-CM | POA: Diagnosis not present

## 2022-03-24 DIAGNOSIS — Z510 Encounter for antineoplastic radiation therapy: Secondary | ICD-10-CM | POA: Diagnosis not present

## 2022-03-24 DIAGNOSIS — E785 Hyperlipidemia, unspecified: Secondary | ICD-10-CM | POA: Diagnosis not present

## 2022-03-24 DIAGNOSIS — C61 Malignant neoplasm of prostate: Secondary | ICD-10-CM | POA: Diagnosis not present

## 2022-03-24 DIAGNOSIS — E1122 Type 2 diabetes mellitus with diabetic chronic kidney disease: Secondary | ICD-10-CM | POA: Diagnosis not present

## 2022-03-24 DIAGNOSIS — D631 Anemia in chronic kidney disease: Secondary | ICD-10-CM | POA: Diagnosis not present

## 2022-03-24 DIAGNOSIS — R3129 Other microscopic hematuria: Secondary | ICD-10-CM | POA: Diagnosis not present

## 2022-03-24 DIAGNOSIS — N189 Chronic kidney disease, unspecified: Secondary | ICD-10-CM | POA: Diagnosis not present

## 2022-03-24 LAB — RAD ONC ARIA SESSION SUMMARY
Course Elapsed Days: 48
Plan Fractions Treated to Date: 35
Plan Prescribed Dose Per Fraction: 2 Gy
Plan Total Fractions Prescribed: 40
Plan Total Prescribed Dose: 80 Gy
Reference Point Dosage Given to Date: 70 Gy
Reference Point Session Dosage Given: 2 Gy
Session Number: 35

## 2022-03-25 ENCOUNTER — Other Ambulatory Visit: Payer: Self-pay

## 2022-03-25 ENCOUNTER — Inpatient Hospital Stay: Payer: Medicare Other

## 2022-03-25 ENCOUNTER — Ambulatory Visit
Admission: RE | Admit: 2022-03-25 | Discharge: 2022-03-25 | Disposition: A | Discharge status: Home / Self Care | Payer: Medicare Other | Source: Ambulatory Visit | Attending: Radiation Oncology | Admitting: Radiation Oncology

## 2022-03-25 DIAGNOSIS — I129 Hypertensive chronic kidney disease with stage 1 through stage 4 chronic kidney disease, or unspecified chronic kidney disease: Secondary | ICD-10-CM | POA: Diagnosis not present

## 2022-03-25 DIAGNOSIS — E785 Hyperlipidemia, unspecified: Secondary | ICD-10-CM | POA: Diagnosis not present

## 2022-03-25 DIAGNOSIS — C61 Malignant neoplasm of prostate: Secondary | ICD-10-CM | POA: Diagnosis not present

## 2022-03-25 DIAGNOSIS — N189 Chronic kidney disease, unspecified: Secondary | ICD-10-CM | POA: Diagnosis not present

## 2022-03-25 DIAGNOSIS — D631 Anemia in chronic kidney disease: Secondary | ICD-10-CM | POA: Diagnosis not present

## 2022-03-25 DIAGNOSIS — Z809 Family history of malignant neoplasm, unspecified: Secondary | ICD-10-CM | POA: Diagnosis not present

## 2022-03-25 DIAGNOSIS — E1122 Type 2 diabetes mellitus with diabetic chronic kidney disease: Secondary | ICD-10-CM | POA: Diagnosis not present

## 2022-03-25 DIAGNOSIS — Z191 Hormone sensitive malignancy status: Secondary | ICD-10-CM | POA: Diagnosis not present

## 2022-03-25 DIAGNOSIS — Z510 Encounter for antineoplastic radiation therapy: Secondary | ICD-10-CM | POA: Diagnosis not present

## 2022-03-25 DIAGNOSIS — R3129 Other microscopic hematuria: Secondary | ICD-10-CM | POA: Diagnosis not present

## 2022-03-25 DIAGNOSIS — Z7984 Long term (current) use of oral hypoglycemic drugs: Secondary | ICD-10-CM | POA: Diagnosis not present

## 2022-03-25 DIAGNOSIS — Z79899 Other long term (current) drug therapy: Secondary | ICD-10-CM | POA: Diagnosis not present

## 2022-03-25 LAB — RAD ONC ARIA SESSION SUMMARY
Course Elapsed Days: 49
Plan Fractions Treated to Date: 36
Plan Prescribed Dose Per Fraction: 2 Gy
Plan Total Fractions Prescribed: 40
Plan Total Prescribed Dose: 80 Gy
Reference Point Dosage Given to Date: 72 Gy
Reference Point Session Dosage Given: 2 Gy
Session Number: 36

## 2022-03-25 LAB — CBC
HCT: 28 % — ABNORMAL LOW (ref 39.0–52.0)
Hemoglobin: 9.3 g/dL — ABNORMAL LOW (ref 13.0–17.0)
MCH: 31.6 pg (ref 26.0–34.0)
MCHC: 33.2 g/dL (ref 30.0–36.0)
MCV: 95.2 fL (ref 80.0–100.0)
Platelets: 260 10*3/uL (ref 150–400)
RBC: 2.94 MIL/uL — ABNORMAL LOW (ref 4.22–5.81)
RDW: 13.2 % (ref 11.5–15.5)
WBC: 3.1 10*3/uL — ABNORMAL LOW (ref 4.0–10.5)
nRBC: 0 % (ref 0.0–0.2)

## 2022-03-26 ENCOUNTER — Ambulatory Visit
Admission: RE | Admit: 2022-03-26 | Discharge: 2022-03-26 | Disposition: A | Discharge status: Home / Self Care | Payer: Medicare Other | Source: Ambulatory Visit | Attending: Radiation Oncology | Admitting: Radiation Oncology

## 2022-03-26 ENCOUNTER — Other Ambulatory Visit: Payer: Self-pay

## 2022-03-26 DIAGNOSIS — Z510 Encounter for antineoplastic radiation therapy: Secondary | ICD-10-CM | POA: Diagnosis not present

## 2022-03-26 DIAGNOSIS — Z79899 Other long term (current) drug therapy: Secondary | ICD-10-CM | POA: Diagnosis not present

## 2022-03-26 DIAGNOSIS — N189 Chronic kidney disease, unspecified: Secondary | ICD-10-CM | POA: Diagnosis not present

## 2022-03-26 DIAGNOSIS — Z809 Family history of malignant neoplasm, unspecified: Secondary | ICD-10-CM | POA: Diagnosis not present

## 2022-03-26 DIAGNOSIS — E785 Hyperlipidemia, unspecified: Secondary | ICD-10-CM | POA: Diagnosis not present

## 2022-03-26 DIAGNOSIS — C61 Malignant neoplasm of prostate: Secondary | ICD-10-CM | POA: Diagnosis not present

## 2022-03-26 DIAGNOSIS — R3129 Other microscopic hematuria: Secondary | ICD-10-CM | POA: Diagnosis not present

## 2022-03-26 DIAGNOSIS — E1122 Type 2 diabetes mellitus with diabetic chronic kidney disease: Secondary | ICD-10-CM | POA: Diagnosis not present

## 2022-03-26 DIAGNOSIS — I129 Hypertensive chronic kidney disease with stage 1 through stage 4 chronic kidney disease, or unspecified chronic kidney disease: Secondary | ICD-10-CM | POA: Diagnosis not present

## 2022-03-26 DIAGNOSIS — D631 Anemia in chronic kidney disease: Secondary | ICD-10-CM | POA: Diagnosis not present

## 2022-03-26 DIAGNOSIS — Z191 Hormone sensitive malignancy status: Secondary | ICD-10-CM | POA: Diagnosis not present

## 2022-03-26 DIAGNOSIS — Z7984 Long term (current) use of oral hypoglycemic drugs: Secondary | ICD-10-CM | POA: Diagnosis not present

## 2022-03-26 LAB — RAD ONC ARIA SESSION SUMMARY
Course Elapsed Days: 50
Plan Fractions Treated to Date: 37
Plan Prescribed Dose Per Fraction: 2 Gy
Plan Total Fractions Prescribed: 40
Plan Total Prescribed Dose: 80 Gy
Reference Point Dosage Given to Date: 74 Gy
Reference Point Session Dosage Given: 2 Gy
Session Number: 37

## 2022-03-29 ENCOUNTER — Other Ambulatory Visit: Payer: Self-pay

## 2022-03-29 ENCOUNTER — Ambulatory Visit
Admission: RE | Admit: 2022-03-29 | Discharge: 2022-03-29 | Disposition: A | Discharge status: Home / Self Care | Payer: Medicare Other | Source: Ambulatory Visit | Attending: Radiation Oncology | Admitting: Radiation Oncology

## 2022-03-29 DIAGNOSIS — D631 Anemia in chronic kidney disease: Secondary | ICD-10-CM | POA: Diagnosis not present

## 2022-03-29 DIAGNOSIS — Z191 Hormone sensitive malignancy status: Secondary | ICD-10-CM | POA: Diagnosis not present

## 2022-03-29 DIAGNOSIS — E785 Hyperlipidemia, unspecified: Secondary | ICD-10-CM | POA: Diagnosis not present

## 2022-03-29 DIAGNOSIS — Z79899 Other long term (current) drug therapy: Secondary | ICD-10-CM | POA: Diagnosis not present

## 2022-03-29 DIAGNOSIS — R3129 Other microscopic hematuria: Secondary | ICD-10-CM | POA: Diagnosis not present

## 2022-03-29 DIAGNOSIS — Z7984 Long term (current) use of oral hypoglycemic drugs: Secondary | ICD-10-CM | POA: Diagnosis not present

## 2022-03-29 DIAGNOSIS — I129 Hypertensive chronic kidney disease with stage 1 through stage 4 chronic kidney disease, or unspecified chronic kidney disease: Secondary | ICD-10-CM | POA: Diagnosis not present

## 2022-03-29 DIAGNOSIS — Z510 Encounter for antineoplastic radiation therapy: Secondary | ICD-10-CM | POA: Diagnosis not present

## 2022-03-29 DIAGNOSIS — E1122 Type 2 diabetes mellitus with diabetic chronic kidney disease: Secondary | ICD-10-CM | POA: Diagnosis not present

## 2022-03-29 DIAGNOSIS — C61 Malignant neoplasm of prostate: Secondary | ICD-10-CM | POA: Diagnosis not present

## 2022-03-29 DIAGNOSIS — Z809 Family history of malignant neoplasm, unspecified: Secondary | ICD-10-CM | POA: Diagnosis not present

## 2022-03-29 DIAGNOSIS — N189 Chronic kidney disease, unspecified: Secondary | ICD-10-CM | POA: Diagnosis not present

## 2022-03-29 LAB — RAD ONC ARIA SESSION SUMMARY
Course Elapsed Days: 53
Plan Fractions Treated to Date: 38
Plan Prescribed Dose Per Fraction: 2 Gy
Plan Total Fractions Prescribed: 40
Plan Total Prescribed Dose: 80 Gy
Reference Point Dosage Given to Date: 76 Gy
Reference Point Session Dosage Given: 2 Gy
Session Number: 38

## 2022-03-30 ENCOUNTER — Other Ambulatory Visit: Payer: Self-pay

## 2022-03-30 ENCOUNTER — Ambulatory Visit
Admission: RE | Admit: 2022-03-30 | Discharge: 2022-03-30 | Disposition: A | Discharge status: Home / Self Care | Payer: Medicare Other | Source: Ambulatory Visit | Attending: Radiation Oncology | Admitting: Radiation Oncology

## 2022-03-30 DIAGNOSIS — E1122 Type 2 diabetes mellitus with diabetic chronic kidney disease: Secondary | ICD-10-CM | POA: Diagnosis not present

## 2022-03-30 DIAGNOSIS — Z7984 Long term (current) use of oral hypoglycemic drugs: Secondary | ICD-10-CM | POA: Diagnosis not present

## 2022-03-30 DIAGNOSIS — Z510 Encounter for antineoplastic radiation therapy: Secondary | ICD-10-CM | POA: Diagnosis not present

## 2022-03-30 DIAGNOSIS — Z809 Family history of malignant neoplasm, unspecified: Secondary | ICD-10-CM | POA: Diagnosis not present

## 2022-03-30 DIAGNOSIS — D631 Anemia in chronic kidney disease: Secondary | ICD-10-CM | POA: Diagnosis not present

## 2022-03-30 DIAGNOSIS — I129 Hypertensive chronic kidney disease with stage 1 through stage 4 chronic kidney disease, or unspecified chronic kidney disease: Secondary | ICD-10-CM | POA: Diagnosis not present

## 2022-03-30 DIAGNOSIS — E785 Hyperlipidemia, unspecified: Secondary | ICD-10-CM | POA: Diagnosis not present

## 2022-03-30 DIAGNOSIS — C61 Malignant neoplasm of prostate: Secondary | ICD-10-CM | POA: Diagnosis not present

## 2022-03-30 DIAGNOSIS — Z79899 Other long term (current) drug therapy: Secondary | ICD-10-CM | POA: Diagnosis not present

## 2022-03-30 DIAGNOSIS — Z191 Hormone sensitive malignancy status: Secondary | ICD-10-CM | POA: Diagnosis not present

## 2022-03-30 DIAGNOSIS — N189 Chronic kidney disease, unspecified: Secondary | ICD-10-CM | POA: Diagnosis not present

## 2022-03-30 DIAGNOSIS — R3129 Other microscopic hematuria: Secondary | ICD-10-CM | POA: Diagnosis not present

## 2022-03-30 LAB — RAD ONC ARIA SESSION SUMMARY
Course Elapsed Days: 54
Plan Fractions Treated to Date: 39
Plan Prescribed Dose Per Fraction: 2 Gy
Plan Total Fractions Prescribed: 40
Plan Total Prescribed Dose: 80 Gy
Reference Point Dosage Given to Date: 78 Gy
Reference Point Session Dosage Given: 2 Gy
Session Number: 39

## 2022-03-31 ENCOUNTER — Other Ambulatory Visit: Payer: Self-pay

## 2022-03-31 ENCOUNTER — Ambulatory Visit
Admission: RE | Admit: 2022-03-31 | Discharge: 2022-03-31 | Disposition: A | Discharge status: Home / Self Care | Payer: Medicare Other | Source: Ambulatory Visit | Attending: Radiation Oncology | Admitting: Radiation Oncology

## 2022-03-31 DIAGNOSIS — E1122 Type 2 diabetes mellitus with diabetic chronic kidney disease: Secondary | ICD-10-CM | POA: Diagnosis not present

## 2022-03-31 DIAGNOSIS — Z7984 Long term (current) use of oral hypoglycemic drugs: Secondary | ICD-10-CM | POA: Diagnosis not present

## 2022-03-31 DIAGNOSIS — Z510 Encounter for antineoplastic radiation therapy: Secondary | ICD-10-CM | POA: Diagnosis not present

## 2022-03-31 DIAGNOSIS — Z79899 Other long term (current) drug therapy: Secondary | ICD-10-CM | POA: Diagnosis not present

## 2022-03-31 DIAGNOSIS — N189 Chronic kidney disease, unspecified: Secondary | ICD-10-CM | POA: Diagnosis not present

## 2022-03-31 DIAGNOSIS — I129 Hypertensive chronic kidney disease with stage 1 through stage 4 chronic kidney disease, or unspecified chronic kidney disease: Secondary | ICD-10-CM | POA: Diagnosis not present

## 2022-03-31 DIAGNOSIS — Z809 Family history of malignant neoplasm, unspecified: Secondary | ICD-10-CM | POA: Diagnosis not present

## 2022-03-31 DIAGNOSIS — Z191 Hormone sensitive malignancy status: Secondary | ICD-10-CM | POA: Diagnosis not present

## 2022-03-31 DIAGNOSIS — D631 Anemia in chronic kidney disease: Secondary | ICD-10-CM | POA: Diagnosis not present

## 2022-03-31 DIAGNOSIS — C61 Malignant neoplasm of prostate: Secondary | ICD-10-CM | POA: Diagnosis not present

## 2022-03-31 DIAGNOSIS — E785 Hyperlipidemia, unspecified: Secondary | ICD-10-CM | POA: Diagnosis not present

## 2022-03-31 DIAGNOSIS — R3129 Other microscopic hematuria: Secondary | ICD-10-CM | POA: Diagnosis not present

## 2022-03-31 LAB — RAD ONC ARIA SESSION SUMMARY
Course Elapsed Days: 55
Plan Fractions Treated to Date: 40
Plan Prescribed Dose Per Fraction: 2 Gy
Plan Total Fractions Prescribed: 40
Plan Total Prescribed Dose: 80 Gy
Reference Point Dosage Given to Date: 80 Gy
Reference Point Session Dosage Given: 2 Gy
Session Number: 40

## 2022-04-09 ENCOUNTER — Other Ambulatory Visit: Payer: Self-pay | Admitting: Family Medicine

## 2022-04-09 DIAGNOSIS — R1013 Epigastric pain: Secondary | ICD-10-CM

## 2022-04-16 ENCOUNTER — Ambulatory Visit: Payer: Medicare Other | Admitting: Family Medicine

## 2022-04-20 ENCOUNTER — Encounter: Payer: Self-pay | Admitting: Family Medicine

## 2022-04-20 ENCOUNTER — Ambulatory Visit (INDEPENDENT_AMBULATORY_CARE_PROVIDER_SITE_OTHER): Payer: Medicare Other | Admitting: Family Medicine

## 2022-04-20 VITALS — BP 132/74 | HR 94 | Temp 98.3°F | Resp 16 | Ht 64.0 in | Wt 179.0 lb

## 2022-04-20 DIAGNOSIS — R350 Frequency of micturition: Secondary | ICD-10-CM

## 2022-04-20 DIAGNOSIS — R351 Nocturia: Secondary | ICD-10-CM | POA: Diagnosis not present

## 2022-04-20 DIAGNOSIS — E1165 Type 2 diabetes mellitus with hyperglycemia: Secondary | ICD-10-CM | POA: Diagnosis not present

## 2022-04-20 NOTE — Progress Notes (Signed)
Name: Cameron Glover   MRN: [id number]    DOB: [birth year]   Date:04/20/2022       Progress Note  Chief Complaint  Patient presents with   Follow-up   Diabetes    Pt states BS running in between 130s-170s     Subjective:   Cameron Glover is a 72 y.o. male, presents to clinic for f/up on blood sugars  F/up with uncontrolled diabetes Working on diet/lifestyle, more salads, protein avoiding fried foods Metformin 500 mg once daily  Added farxiga Brings in glucometer today most recent am readings CBG's 140's 60 and 90 d average is 150-158 Lab Results  Component Value Date   HGBA1C 7.8 03/12/2022   No bothersome urinary sx, no hypoglycemia    Current Outpatient Medications:    amLODipine (NORVASC) 10 MG tablet, TAKE 1 TABLET BY MOUTH EVERY DAY, Disp: 90 tablet, Rfl: 1   atorvastatin (LIPITOR) 40 MG tablet, TAKE 1 TABLET BY MOUTH EVERY DAY, Disp: 90 tablet, Rfl: 3   FARXIGA 10 MG TABS tablet, Take 10 mg by mouth daily., Disp: , Rfl:    hydrALAZINE (APRESOLINE) 25 MG tablet, TAKE 1 TABLET BY MOUTH THREE TIMES A DAY, Disp: 270 tablet, Rfl: 0   lisinopril-hydrochlorothiazide (ZESTORETIC) 20-12.5 MG tablet, Take 2 tablets by mouth daily., Disp: 180 tablet, Rfl: 1   metFORMIN (GLUCOPHAGE) 500 MG tablet, Take 1 tablet (500 mg total) by mouth daily with breakfast., Disp: 90 tablet, Rfl: 1   Oyster Shell Calcium 500 MG TABS, Take 1 tablet by mouth 2 (two) times daily., Disp: , Rfl:    pantoprazole (PROTONIX) 40 MG tablet, TAKE 1 TABLET BY MOUTH EVERY DAY, Disp: 90 tablet, Rfl: 3  Patient Active Problem List   Diagnosis Date Noted   Stage 3b chronic kidney disease 03/16/2022   Class 1 obesity with serious comorbidity and body mass index (BMI) of 30.0 to 30.9 in adult 03/16/2022   History of prostate cancer 08/17/2021   Grade 3 hypertensive retinopathy, bilateral 04/29/2021   Uncontrolled diabetes mellitus with hyperglycemia, without long-term current use of insulin 03/30/2021   Snores  11/17/2020   Pseudophakia of left eye 10/13/2020   Anemia 08/11/2020   Gastroesophageal reflux disease 08/11/2020   Vitamin B12 deficiency 08/11/2020   Vitamin D deficiency 08/11/2020   Elevated parathyroid hormone 08/11/2020   Secondary hyperparathyroidism 08/11/2020   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   Severe nonproliferative diabetic retinopathy of left eye, with macular edema, associated with type 2 diabetes mellitus 05/14/2019   Severe nonproliferative diabetic retinopathy of right eye, with macular edema, associated with type 2 diabetes mellitus 05/14/2019   Retinal hemorrhage of right eye 05/14/2019   Retinal hemorrhage of left eye 05/14/2019   Nuclear sclerotic cataract of right eye 05/14/2019   LVH (left ventricular hypertrophy) 03/23/2019   Hyperlipidemia associated with type 2 diabetes mellitus 09/22/2018   CKD stage 3 due to type 2 diabetes mellitus 09/22/2018   Essential hypertension 04/15/2018    Past Surgical History:  Procedure Laterality Date   COLONOSCOPY WITH PROPOFOL N/A 01/15/2019   Procedure: COLONOSCOPY WITH PROPOFOL;  Surgeon: Wyline Mood, MD;  Location: [facility] ENDOSCOPY;  Service: Gastroenterology;  Laterality: N/A;    Family History  Problem Relation Age of Onset   Kidney failure Sister    Cancer Sister    Diabetes Sister    Hypertension Sister    Hyperlipidemia Sister    Heart disease Sister    Kidney disease Sister    Cancer Brother  CAD Brother    Diabetes Brother    Hypertension Brother    Hyperlipidemia Brother    Heart disease Brother    Diabetes Other    Cancer Mother     Social History   Tobacco Use   Smoking status: Never    Passive exposure: Never   Smokeless tobacco: Never  Vaping Use   Vaping Use: Never used  Substance Use Topics   Alcohol use: Never   Drug use: Never     No Known Allergies  Health Maintenance  Topic Date Due   DTaP/Tdap/Td (1 - Tdap) Never done   Medicare Annual Wellness (AWV)   05/27/2022   COVID-19 Vaccine (4 - 2023-24 season) 05/06/2022 (Originally 09/04/2021)   Zoster Vaccines- Shingrix (1 of 2) 05/19/2022 (Originally 11/14/1969)   OPHTHALMOLOGY EXAM  04/30/2022   INFLUENZA VACCINE  08/05/2022   HEMOGLOBIN A1C  09/12/2022   Diabetic kidney evaluation - eGFR measurement  03/04/2023   Diabetic kidney evaluation - Urine ACR  03/04/2023   FOOT EXAM  03/12/2023   COLONOSCOPY (Pts 45-55yrs Insurance coverage will need to be confirmed)  01/15/2024   Pneumonia Vaccine 60+ Years old  Completed   Hepatitis C Screening  Completed   HPV VACCINES  Aged Out    Chart Review Today: I personally reviewed active problem list, medication list, allergies, family history, social history, health maintenance, notes from last encounter, lab results, imaging with the patient/caregiver today.   Review of Systems  Constitutional: Negative.   HENT: Negative.    Eyes: Negative.   Respiratory: Negative.    Cardiovascular: Negative.   Gastrointestinal: Negative.   Endocrine: Negative.   Genitourinary: Negative.   Musculoskeletal: Negative.   Skin: Negative.   Allergic/Immunologic: Negative.   Neurological: Negative.   Hematological: Negative.   Psychiatric/Behavioral: Negative.    All other systems reviewed and are negative.    Objective:   Vitals:   04/20/22 1039  BP: 132/74  Pulse: 94  Resp: 16  Temp: 98.3 F (36.8 C)  TempSrc: Oral  SpO2: 99%  Weight: 179 lb (81.2 kg)  Height: 5\' 4"  (1.626 m)    Body mass index is 30.73 kg/m.  Physical Exam Vitals and nursing note reviewed.  Constitutional:      General: He is not in acute distress.    Appearance: Normal appearance. He is well-developed. He is not ill-appearing, toxic-appearing or diaphoretic.  HENT:     Head: Normocephalic and atraumatic.     Nose: Nose normal.  Eyes:     General:        Right eye: No discharge.        Left eye: No discharge.     Conjunctiva/sclera: Conjunctivae normal.  Neck:      Trachea: No tracheal deviation.  Cardiovascular:     Rate and Rhythm: Normal rate and regular rhythm.  Pulmonary:     Effort: Pulmonary effort is normal. No respiratory distress.     Breath sounds: No stridor.  Musculoskeletal:        General: Normal range of motion.  Skin:    General: Skin is warm and dry.     Findings: No rash.  Neurological:     Mental Status: He is alert.     Motor: No abnormal muscle tone.     Coordination: Coordination normal.  Psychiatric:        Behavior: Behavior normal.         Assessment & Plan:   Problem List Items Addressed This  Visit       Endocrine   Uncontrolled diabetes mellitus with hyperglycemia, without long-term current use of insulin - Primary    Previously well controlled, but most recent labs A1C has increased Lab Results  Component Value Date   HGBA1C 7.8 03/12/2022  He is on max metformin per nephrology/renal function Farxiga added CBGs reviewed today, some improvement Continue meds and working on diet Will have him return for labs and f/up in June      Other Visit Diagnoses     Urinary frequency       likely some sx related to recent treatment/radiation prostate CA   Nocturia       2-3x nightly for the past few months - diet/lifestyle urinary freq handout given, if sx bothersome advised to f/up with urology        No follow-ups on file.   Danelle Berry, PA-C 04/20/22 10:57 AM

## 2022-04-20 NOTE — Patient Instructions (Signed)

## 2022-04-29 NOTE — Assessment & Plan Note (Signed)
Previously well controlled, but most recent labs A1C has increased Lab Results  Component Value Date   HGBA1C 7.8 03/12/2022   He is on max metformin per nephrology/renal function Farxiga added CBGs reviewed today, some improvement Continue meds and working on diet Will have him return for labs and f/up in June

## 2022-05-03 ENCOUNTER — Ambulatory Visit: Admission: RE | Admit: 2022-05-03 | Payer: Medicare Other | Source: Ambulatory Visit | Admitting: Radiation Oncology

## 2022-05-03 DIAGNOSIS — Z7984 Long term (current) use of oral hypoglycemic drugs: Secondary | ICD-10-CM | POA: Insufficient documentation

## 2022-05-03 DIAGNOSIS — Z510 Encounter for antineoplastic radiation therapy: Secondary | ICD-10-CM | POA: Insufficient documentation

## 2022-05-03 DIAGNOSIS — Z809 Family history of malignant neoplasm, unspecified: Secondary | ICD-10-CM | POA: Insufficient documentation

## 2022-05-03 DIAGNOSIS — R3129 Other microscopic hematuria: Secondary | ICD-10-CM | POA: Insufficient documentation

## 2022-05-03 DIAGNOSIS — C61 Malignant neoplasm of prostate: Secondary | ICD-10-CM | POA: Insufficient documentation

## 2022-05-03 DIAGNOSIS — Z79899 Other long term (current) drug therapy: Secondary | ICD-10-CM | POA: Insufficient documentation

## 2022-05-03 DIAGNOSIS — E785 Hyperlipidemia, unspecified: Secondary | ICD-10-CM | POA: Insufficient documentation

## 2022-05-03 DIAGNOSIS — E1122 Type 2 diabetes mellitus with diabetic chronic kidney disease: Secondary | ICD-10-CM | POA: Insufficient documentation

## 2022-05-03 DIAGNOSIS — I129 Hypertensive chronic kidney disease with stage 1 through stage 4 chronic kidney disease, or unspecified chronic kidney disease: Secondary | ICD-10-CM | POA: Insufficient documentation

## 2022-05-03 DIAGNOSIS — N189 Chronic kidney disease, unspecified: Secondary | ICD-10-CM | POA: Insufficient documentation

## 2022-05-03 DIAGNOSIS — D631 Anemia in chronic kidney disease: Secondary | ICD-10-CM | POA: Insufficient documentation

## 2022-05-19 ENCOUNTER — Ambulatory Visit: Payer: Medicare Other | Admitting: Skilled Nursing Facility1

## 2022-05-21 DIAGNOSIS — N183 Chronic kidney disease, stage 3 unspecified: Secondary | ICD-10-CM | POA: Diagnosis not present

## 2022-05-21 DIAGNOSIS — I129 Hypertensive chronic kidney disease with stage 1 through stage 4 chronic kidney disease, or unspecified chronic kidney disease: Secondary | ICD-10-CM | POA: Diagnosis not present

## 2022-05-21 DIAGNOSIS — E1122 Type 2 diabetes mellitus with diabetic chronic kidney disease: Secondary | ICD-10-CM | POA: Diagnosis not present

## 2022-05-21 DIAGNOSIS — N3289 Other specified disorders of bladder: Secondary | ICD-10-CM | POA: Diagnosis not present

## 2022-05-21 DIAGNOSIS — D649 Anemia, unspecified: Secondary | ICD-10-CM | POA: Diagnosis not present

## 2022-05-21 LAB — BASIC METABOLIC PANEL
BUN: 45 — AB (ref 4–21)
CO2: 22 (ref 13–22)
Chloride: 103 (ref 99–108)
Creatinine: 2.5 — AB (ref 0.6–1.3)
Glucose: 187
Potassium: 3.9 mEq/L (ref 3.5–5.1)
Sodium: 139 (ref 137–147)

## 2022-05-21 LAB — IRON,TIBC AND FERRITIN PANEL
Ferritin: 308
Iron: 91
UIBC: 143

## 2022-05-21 LAB — COMPREHENSIVE METABOLIC PANEL
Albumin: 4.3 (ref 3.5–5.0)
Calcium: 6.2 — AB (ref 8.7–10.7)
eGFR: 27

## 2022-05-21 LAB — CBC AND DIFFERENTIAL: Hemoglobin: 8.3 — AB (ref 13.5–17.5)

## 2022-05-23 ENCOUNTER — Other Ambulatory Visit: Payer: Self-pay | Admitting: Family Medicine

## 2022-05-23 DIAGNOSIS — E119 Type 2 diabetes mellitus without complications: Secondary | ICD-10-CM

## 2022-05-27 DIAGNOSIS — H43821 Vitreomacular adhesion, right eye: Secondary | ICD-10-CM | POA: Diagnosis not present

## 2022-05-27 DIAGNOSIS — E113413 Type 2 diabetes mellitus with severe nonproliferative diabetic retinopathy with macular edema, bilateral: Secondary | ICD-10-CM | POA: Diagnosis not present

## 2022-05-27 DIAGNOSIS — H35033 Hypertensive retinopathy, bilateral: Secondary | ICD-10-CM | POA: Diagnosis not present

## 2022-05-27 LAB — HM DIABETES EYE EXAM

## 2022-06-14 ENCOUNTER — Ambulatory Visit (HOSPITAL_COMMUNITY)
Admission: RE | Admit: 2022-06-14 | Discharge: 2022-06-14 | Disposition: A | Discharge status: Home / Self Care | Payer: Medicare Other | Source: Ambulatory Visit | Attending: Internal Medicine | Admitting: Internal Medicine

## 2022-06-14 VITALS — BP 146/90 | HR 114 | Temp 96.8°F | Resp 18

## 2022-06-14 DIAGNOSIS — N1832 Chronic kidney disease, stage 3b: Secondary | ICD-10-CM | POA: Diagnosis not present

## 2022-06-14 DIAGNOSIS — D631 Anemia in chronic kidney disease: Secondary | ICD-10-CM | POA: Insufficient documentation

## 2022-06-14 DIAGNOSIS — N184 Chronic kidney disease, stage 4 (severe): Secondary | ICD-10-CM | POA: Diagnosis not present

## 2022-06-14 LAB — POCT HEMOGLOBIN-HEMACUE: Hemoglobin: 8.8 g/dL — ABNORMAL LOW (ref 13.0–17.0)

## 2022-06-14 MED ORDER — EPOETIN ALFA-EPBX 10000 UNIT/ML IJ SOLN
INTRAMUSCULAR | Status: AC
  Filled 2022-06-14: qty 2

## 2022-06-14 MED ORDER — EPOETIN ALFA-EPBX 10000 UNIT/ML IJ SOLN
20000.0000 [IU] | INTRAMUSCULAR | Status: DC
  Administered 2022-06-14: 20000 [IU] via SUBCUTANEOUS

## 2022-06-17 ENCOUNTER — Ambulatory Visit (INDEPENDENT_AMBULATORY_CARE_PROVIDER_SITE_OTHER): Payer: Medicare Other

## 2022-06-17 VITALS — Ht 64.0 in | Wt 179.0 lb

## 2022-06-17 DIAGNOSIS — Z0000 Encounter for general adult medical examination without abnormal findings: Secondary | ICD-10-CM

## 2022-06-17 NOTE — Patient Instructions (Signed)
Cameron Glover , Thank you for taking time to come for your Medicare Wellness Visit. I appreciate your ongoing commitment to your health goals. Please review the following plan we discussed and let me know if I can assist you in the future.   These are the goals we discussed:  Goals      Increase physical activity     Recommend increasing physical activity to 150 minutes per week      Patient Stated     Patient would like to maintain physical activity and keep A1c < 6.5%        This is a list of the screening recommended for you and due dates:  Health Maintenance  Topic Date Due   DTaP/Tdap/Td vaccine (1 - Tdap) Never done   Zoster (Shingles) Vaccine (1 of 2) Never done   COVID-19 Vaccine (4 - 2023-24 season) 09/04/2021   Eye exam for diabetics  04/30/2022   Flu Shot  08/05/2022   Hemoglobin A1C  09/12/2022   Yearly kidney function blood test for diabetes  03/04/2023   Yearly kidney health urinalysis for diabetes  03/04/2023   Complete foot exam   03/12/2023   Medicare Annual Wellness Visit  06/17/2023   Colon Cancer Screening  01/15/2024   Pneumonia Vaccine  Completed   Hepatitis C Screening  Completed   HPV Vaccine  Aged Out    Advanced directives: no  Conditions/risks identified: none  Next appointment: Follow up in one year for your annual wellness visit. 06/23/2023 @2 :30pm telephone  Preventive Care 65 Years and Older, Male  Preventive care refers to lifestyle choices and visits with your health care provider that can promote health and wellness. What does preventive care include? A yearly physical exam. This is also called an annual well check. Dental exams once or twice a year. Routine eye exams. Ask your health care provider how often you should have your eyes checked. Personal lifestyle choices, including: Daily care of your teeth and gums. Regular physical activity. Eating a healthy diet. Avoiding tobacco and drug use. Limiting alcohol use. Practicing safe  sex. Taking low doses of aspirin every day. Taking vitamin and mineral supplements as recommended by your health care provider. What happens during an annual well check? The services and screenings done by your health care provider during your annual well check will depend on your age, overall health, lifestyle risk factors, and family history of disease. Counseling  Your health care provider may ask you questions about your: Alcohol use. Tobacco use. Drug use. Emotional well-being. Home and relationship well-being. Sexual activity. Eating habits. History of falls. Memory and ability to understand (cognition). Work and work Astronomer. Screening  You may have the following tests or measurements: Height, weight, and BMI. Blood pressure. Lipid and cholesterol levels. These may be checked every 5 years, or more frequently if you are over 85 years old. Skin check. Lung cancer screening. You may have this screening every year starting at age 33 if you have a 30-pack-year history of smoking and currently smoke or have quit within the past 15 years. Fecal occult blood test (FOBT) of the stool. You may have this test every year starting at age 66. Flexible sigmoidoscopy or colonoscopy. You may have a sigmoidoscopy every 5 years or a colonoscopy every 10 years starting at age 88. Prostate cancer screening. Recommendations will vary depending on your family history and other risks. Hepatitis C blood test. Hepatitis B blood test. Sexually transmitted disease (STD) testing. Diabetes screening. This  is done by checking your blood sugar (glucose) after you have not eaten for a while (fasting). You may have this done every 1-3 years. Abdominal aortic aneurysm (AAA) screening. You may need this if you are a current or former smoker. Osteoporosis. You may be screened starting at age 21 if you are at high risk. Talk with your health care provider about your test results, treatment options, and if  necessary, the need for more tests. Vaccines  Your health care provider may recommend certain vaccines, such as: Influenza vaccine. This is recommended every year. Tetanus, diphtheria, and acellular pertussis (Tdap, Td) vaccine. You may need a Td booster every 10 years. Zoster vaccine. You may need this after age 84. Pneumococcal 13-valent conjugate (PCV13) vaccine. One dose is recommended after age 71. Pneumococcal polysaccharide (PPSV23) vaccine. One dose is recommended after age 41. Talk to your health care provider about which screenings and vaccines you need and how often you need them. This information is not intended to replace advice given to you by your health care provider. Make sure you discuss any questions you have with your health care provider. Document Released: 01/17/2015 Document Revised: 09/10/2015 Document Reviewed: 10/22/2014 Elsevier Interactive Patient Education  2017 [employer] Prevention in the Home Falls can cause injuries. They can happen to people of all ages. There are many things you can do to make your home safe and to help prevent falls. What can I do on the outside of my home? Regularly fix the edges of walkways and driveways and fix any cracks. Remove anything that might make you trip as you walk through a door, such as a raised step or threshold. Trim any bushes or trees on the path to your home. Use bright outdoor lighting. Clear any walking paths of anything that might make someone trip, such as rocks or tools. Regularly check to see if handrails are loose or broken. Make sure that both sides of any steps have handrails. Any raised decks and porches should have guardrails on the edges. Have any leaves, snow, or ice cleared regularly. Use sand or salt on walking paths during winter. Clean up any spills in your garage right away. This includes oil or grease spills. What can I do in the bathroom? Use night lights. Install grab bars by the toilet  and in the tub and shower. Do not use towel bars as grab bars. Use non-skid mats or decals in the tub or shower. If you need to sit down in the shower, use a plastic, non-slip stool. Keep the floor dry. Clean up any water that spills on the floor as soon as it happens. Remove soap buildup in the tub or shower regularly. Attach bath mats securely with double-sided non-slip rug tape. Do not have throw rugs and other things on the floor that can make you trip. What can I do in the bedroom? Use night lights. Make sure that you have a light by your bed that is easy to reach. Do not use any sheets or blankets that are too big for your bed. They should not hang down onto the floor. Have a firm chair that has side arms. You can use this for support while you get dressed. Do not have throw rugs and other things on the floor that can make you trip. What can I do in the kitchen? Clean up any spills right away. Avoid walking on wet floors. Keep items that you use a lot in easy-to-reach places. If you  need to reach something above you, use a strong step stool that has a grab bar. Keep electrical cords out of the way. Do not use floor polish or wax that makes floors slippery. If you must use wax, use non-skid floor wax. Do not have throw rugs and other things on the floor that can make you trip. What can I do with my stairs? Do not leave any items on the stairs. Make sure that there are handrails on both sides of the stairs and use them. Fix handrails that are broken or loose. Make sure that handrails are as long as the stairways. Check any carpeting to make sure that it is firmly attached to the stairs. Fix any carpet that is loose or worn. Avoid having throw rugs at the top or bottom of the stairs. If you do have throw rugs, attach them to the floor with carpet tape. Make sure that you have a light switch at the top of the stairs and the bottom of the stairs. If you do not have them, ask someone to add  them for you. What else can I do to help prevent falls? Wear shoes that: Do not have high heels. Have rubber bottoms. Are comfortable and fit you well. Are closed at the toe. Do not wear sandals. If you use a stepladder: Make sure that it is fully opened. Do not climb a closed stepladder. Make sure that both sides of the stepladder are locked into place. Ask someone to hold it for you, if possible. Clearly mark and make sure that you can see: Any grab bars or handrails. First and last steps. Where the edge of each step is. Use tools that help you move around (mobility aids) if they are needed. These include: Canes. Walkers. Scooters. Crutches. Turn on the lights when you go into a dark area. Replace any light bulbs as soon as they burn out. Set up your furniture so you have a clear path. Avoid moving your furniture around. If any of your floors are uneven, fix them. If there are any pets around you, be aware of where they are. Review your medicines with your doctor. Some medicines can make you feel dizzy. This can increase your chance of falling. Ask your doctor what other things that you can do to help prevent falls. This information is not intended to replace advice given to you by your health care provider. Make sure you discuss any questions you have with your health care provider. Document Released: 10/17/2008 Document Revised: 05/29/2015 Document Reviewed: 01/25/2014 Elsevier Interactive Patient Education  2017 [employer].

## 2022-06-17 NOTE — Progress Notes (Signed)
I connected with  Cameron Glover on 06/17/22 by a audio enabled telemedicine application and verified that I am speaking with the correct person using two identifiers.  Patient Location: Home  Provider Location: Office/Clinic  I discussed the limitations of evaluation and management by telemedicine. The patient expressed understanding and agreed to proceed.  Subjective:   Cameron Glover is a 72 y.o. male who presents for Medicare Annual/Subsequent preventive examination.  Review of Systems     Cardiac Risk Factors include: advanced age (>14men, >58 women);diabetes mellitus;dyslipidemia;male gender;obesity (BMI >30kg/m2);sedentary lifestyle     Objective:    Today's Vitals   06/17/22 1440  Weight: 179 lb (81.2 kg)  Height: 5\' 4"  (1.626 m)   Body mass index is 30.73 kg/m.     06/17/2022    2:47 PM 12/23/2021    2:01 PM 05/26/2021    4:04 PM 04/08/2020    2:26 PM 04/05/2019    2:59 PM 01/15/2019    8:06 AM 09/02/2017   11:39 PM  Advanced Directives  Does Patient Have a Medical Advance Directive? No No No No No No No  Would patient like information on creating a medical advance directive?  No - Patient declined Yes (MAU/Ambulatory/Procedural Areas - Information given) Yes (MAU/Ambulatory/Procedural Areas - Information given) No - Patient declined No - Patient declined Yes (Inpatient - patient requests chaplain consult to create a medical advance directive)    Current Medications (verified) Outpatient Encounter Medications as of 06/17/2022  Medication Sig   amLODipine (NORVASC) 10 MG tablet TAKE 1 TABLET BY MOUTH EVERY DAY   atorvastatin (LIPITOR) 40 MG tablet TAKE 1 TABLET BY MOUTH EVERY DAY   FARXIGA 10 MG TABS tablet Take 10 mg by mouth daily.   hydrALAZINE (APRESOLINE) 25 MG tablet TAKE 1 TABLET BY MOUTH THREE TIMES A DAY   lisinopril-hydrochlorothiazide (ZESTORETIC) 20-12.5 MG tablet Take 2 tablets by mouth daily.   metFORMIN (GLUCOPHAGE) 500 MG tablet Take 1 tablet (500 mg  total) by mouth daily with breakfast.   Oyster Shell Calcium 500 MG TABS Take 1 tablet by mouth 2 (two) times daily.   pantoprazole (PROTONIX) 40 MG tablet TAKE 1 TABLET BY MOUTH EVERY DAY   No facility-administered encounter medications on file as of 06/17/2022.    Allergies (verified) Patient has no known allergies.   History: Past Medical History:  Diagnosis Date   Acute renal failure (ARF) (HCC)    AKI (acute kidney injury) (HCC) 09/02/2017   Anemia due to chronic kidney disease 04/15/2018   DM (diabetes mellitus) with complications (HCC) 09/02/2017   Elevated lipase 04/15/2018   Hyperglycemia without ketosis    Hyperlipidemia    Hypertension    Nuclear sclerotic cataract of left eye 05/14/2019   Past Surgical History:  Procedure Laterality Date   COLONOSCOPY WITH PROPOFOL N/A 01/15/2019   Procedure: COLONOSCOPY WITH PROPOFOL;  Surgeon: Wyline Mood, MD;  Location: [facility] ENDOSCOPY;  Service: Gastroenterology;  Laterality: N/A;   Family History  Problem Relation Age of Onset   Kidney failure Sister    Cancer Sister    Diabetes Sister    Hypertension Sister    Hyperlipidemia Sister    Heart disease Sister    Kidney disease Sister    Cancer Brother    CAD Brother    Diabetes Brother    Hypertension Brother    Hyperlipidemia Brother    Heart disease Brother    Diabetes Other    Cancer Mother    Social History   Socioeconomic  History   Marital status: Single    Spouse name: Not on file   Number of children: Not on file   Years of education: Not on file   Highest education level: High school graduate  Occupational History   Occupation: Retired    Comment: Haematologist  Tobacco Use   Smoking status: Never    Passive exposure: Never   Smokeless tobacco: Never  Vaping Use   Vaping Use: Never used  Substance and Sexual Activity   Alcohol use: Never   Drug use: Never   Sexual activity: Yes    Comment: couple male partners  Other Topics Concern   Not on file   Social History Narrative   Pt lives with his nephew   Social Determinants of Health   Financial Resource Strain: Low Risk  (06/17/2022)   Overall Financial Resource Strain (CARDIA)    Difficulty of Paying Living Expenses: Not very hard  Food Insecurity: No Food Insecurity (06/17/2022)   Hunger Vital Sign    Worried About Running Out of Food in the Last Year: Never true    Ran Out of Food in the Last Year: Never true  Transportation Needs: No Transportation Needs (06/17/2022)   PRAPARE - Administrator, Civil Service (Medical): No    Lack of Transportation (Non-Medical): No  Physical Activity: Inactive (06/17/2022)   Exercise Vital Sign    Days of Exercise per Week: 0 days    Minutes of Exercise per Session: 0 min  Stress: No Stress Concern Present (06/17/2022)   [employer] of Occupational Health - Occupational Stress Questionnaire    Feeling of Stress : Not at all  Social Connections: Socially Isolated (06/17/2022)   Social Connection and Isolation Panel [NHANES]    Frequency of Communication with Friends and Family: More than three times a week    Frequency of Social Gatherings with Friends and Family: More than three times a week    Attends Religious Services: Never    Database administrator or Organizations: No    Attends Engineer, structural: Never    Marital Status: Never married    Tobacco Counseling Counseling given: Not Answered  Clinical Intake:  Pre-visit preparation completed: Yes  Pain : No/denies pain   BMI - recorded: 30.73 Nutritional Status: BMI > 30  Obese Nutritional Risks: None Diabetes: Yes CBG done?: Yes (bs -140 this am) CBG resulted in Enter/ Edit results?: No Did pt. bring in CBG monitor from home?: No  How often do you need to have someone help you when you read instructions, pamphlets, or other written materials from your doctor or pharmacy?: 1 - Never  Diabetic? yes   Interpreter Needed?: No  Comments: lives  with nephew Information entered by :: B.Maxximus Gotay,LPN   Activities of Daily Living    06/17/2022    2:47 PM 04/20/2022   10:39 AM  In your present state of health, do you have any difficulty performing the following activities:  Hearing? 0 0  Vision? 0 0  Difficulty concentrating or making decisions? 0 0  Walking or climbing stairs? 0 0  Dressing or bathing? 0 0  Doing errands, shopping? 0 0  Preparing Food and eating ? N   Using the Toilet? N   In the past six months, have you accidently leaked urine? N   Do you have problems with loss of bowel control? N   Managing your Medications? N   Managing your Finances? N   Housekeeping  or managing your Housekeeping? N     Patient Care Team: Danelle Berry, PA-C as PCP - General (Family Medicine) [facility]  Indicate any recent Medical Services you may have received from other than [facility] providers in the past year (date may be approximate).     Assessment:   This is a routine wellness examination for Cameron Glover.  Hearing/Vision screen Hearing Screening - Comments:: Adequate hearing:ringing sometimes Vision Screening - Comments:: Adequate vision w/glasses [facility]  Dietary issues and exercise activities discussed: Current Exercise Habits: The patient does not participate in regular exercise at present, Exercise limited by: cardiac condition(s)   Goals Addressed             This Visit's Progress    Increase physical activity   Not on track    Recommend increasing physical activity to 150 minutes per week      Patient Stated   Not on track    Patient would like to maintain physical activity and keep A1c < 6.5%       Depression Screen    06/17/2022    2:45 PM 04/20/2022   10:39 AM 03/16/2022    9:31 AM 03/04/2022    8:59 AM 02/18/2022    9:10 AM 08/17/2021    1:54 PM 05/26/2021    4:04 PM  PHQ 2/9 Scores  PHQ - 2 Score 0 0 0 0 0 0 0  PHQ- 9 Score  0 0 0 0      Fall Risk    06/17/2022    2:44 PM  04/20/2022   10:38 AM 03/16/2022    9:31 AM 03/04/2022    8:59 AM 02/18/2022    9:10 AM  Fall Risk   Falls in the past year? 0 0 0 0 0  Number falls in past yr: 0 0 0 0 0  Injury with Fall? 0 0 0 0 0  Risk for fall due to : No Fall Risks No Fall Risks No Fall Risks No Fall Risks No Fall Risks  Follow up Education provided;Falls prevention discussed Falls prevention discussed;Education provided;Falls evaluation completed Falls prevention discussed;Education provided;Falls evaluation completed Falls prevention discussed;Education provided;Falls evaluation completed Falls prevention discussed;Education provided;Falls evaluation completed    FALL RISK PREVENTION PERTAINING TO THE HOME:  Any stairs in or around the home? Yes  If so, are there any without handrails? Yes  Home free of loose throw rugs in walkways, pet beds, electrical cords, etc? Yes  Adequate lighting in your home to reduce risk of falls? Yes   ASSISTIVE DEVICES UTILIZED TO PREVENT FALLS:  Life alert? No  Use of a cane, walker or w/c? No  Grab bars in the bathroom? No  Shower chair or bench in shower? No  Elevated toilet seat or a handicapped toilet? No   Cognitive Function:        06/17/2022    2:50 PM 04/08/2020    2:29 PM 04/05/2019    3:04 PM  6CIT Screen  What Year? 0 points 0 points 0 points  What month? 0 points 0 points 0 points  What time? 0 points 0 points 0 points  Count back from 20 0 points 0 points 0 points  Months in reverse 4 points 4 points 4 points  Repeat phrase 2 points 0 points 2 points  Total Score 6 points 4 points 6 points    Immunizations Immunization History  Administered Date(s) Administered   Fluad Quad(high Dose 65+) 02/12/2021   Influenza,inj,Quad PF,6+  Mos 10/04/2017   Influenza-Unspecified 10/04/2018   PFIZER(Purple Top)SARS-COV-2 Vaccination 03/04/2019, 03/28/2019, 01/15/2020   PNEUMOCOCCAL CONJUGATE-20 08/11/2020   Pneumococcal Conjugate-13 07/23/2019   Pneumococcal  Polysaccharide-23 09/03/2017    TDAP status: Up to date  Flu Vaccine status: Up to date  Pneumococcal vaccine status: Up to date  Covid-19 vaccine status: Completed vaccines  Qualifies for Shingles Vaccine? Yes   Zostavax completed No   Shingrix Completed?: No.    Education has been provided regarding the importance of this vaccine. Patient has been advised to call insurance company to determine out of pocket expense if they have not yet received this vaccine. Advised may also receive vaccine at local pharmacy or Health Dept. Verbalized acceptance and understanding.  Screening Tests Health Maintenance  Topic Date Due   DTaP/Tdap/Td (1 - Tdap) Never done   Zoster Vaccines- Shingrix (1 of 2) Never done   COVID-19 Vaccine (4 - 2023-24 season) 09/04/2021   OPHTHALMOLOGY EXAM  04/30/2022   INFLUENZA VACCINE  08/05/2022   HEMOGLOBIN A1C  09/12/2022   Diabetic kidney evaluation - eGFR measurement  03/04/2023   Diabetic kidney evaluation - Urine ACR  03/04/2023   FOOT EXAM  03/12/2023   Medicare Annual Wellness (AWV)  06/17/2023   Colonoscopy  01/15/2024   Pneumonia Vaccine 13+ Years old  Completed   Hepatitis C Screening  Completed   HPV VACCINES  Aged Out    Health Maintenance  Health Maintenance Due  Topic Date Due   DTaP/Tdap/Td (1 - Tdap) Never done   Zoster Vaccines- Shingrix (1 of 2) Never done   COVID-19 Vaccine (4 - 2023-24 season) 09/04/2021   OPHTHALMOLOGY EXAM  04/30/2022    Colorectal cancer screening: Type of screening: Colonoscopy. Completed yes. Repeat every 5 years  Lung Cancer Screening: (Low Dose CT Chest recommended if Age 48-80 years, 30 pack-year currently smoking OR have quit w/in 15years.) does not qualify.   Lung Cancer Screening Referral: no  Additional Screening:  Hepatitis C Screening: does not qualify; Completed yes  Vision Screening: Recommended annual ophthalmology exams for early detection of glaucoma and other disorders of the eye. Is  the patient up to date with their annual eye exam?  Yes  Who is the provider or what is the name of the office in which the patient attends annual eye exams? [facility] If pt is not established with a provider, would they like to be referred to a provider to establish care? No .   Dental Screening: Recommended annual dental exams for proper oral hygiene  Community Resource Referral / Chronic Care Management: CRR required this visit?  No   CCM required this visit?  No     Plan:     I have personally reviewed and noted the following in the patient's chart:   Medical and social history Use of alcohol, tobacco or illicit drugs  Current medications and supplements including opioid prescriptions. Patient is not currently taking opioid prescriptions. Functional ability and status Nutritional status Physical activity Advanced directives List of other physicians Hospitalizations, surgeries, and ER visits in previous 12 months Vitals Screenings to include cognitive, depression, and falls Referrals and appointments  In addition, I have reviewed and discussed with patient certain preventive protocols, quality metrics, and best practice recommendations. A written personalized care plan for preventive services as well as general preventive health recommendations were provided to patient.    Sue Lush, LPN   1/61/0960   Nurse Notes: The patient states he is doing well and  has no concerns or questions at this time.

## 2022-06-21 ENCOUNTER — Ambulatory Visit (INDEPENDENT_AMBULATORY_CARE_PROVIDER_SITE_OTHER): Payer: Medicare Other | Admitting: Family Medicine

## 2022-06-21 ENCOUNTER — Encounter: Payer: Self-pay | Admitting: Family Medicine

## 2022-06-21 VITALS — BP 130/70 | HR 99 | Temp 98.1°F | Resp 16 | Ht 64.0 in | Wt 181.5 lb

## 2022-06-21 DIAGNOSIS — E1165 Type 2 diabetes mellitus with hyperglycemia: Secondary | ICD-10-CM

## 2022-06-21 DIAGNOSIS — N183 Chronic kidney disease, stage 3 unspecified: Secondary | ICD-10-CM

## 2022-06-21 DIAGNOSIS — E1122 Type 2 diabetes mellitus with diabetic chronic kidney disease: Secondary | ICD-10-CM

## 2022-06-21 DIAGNOSIS — I10 Essential (primary) hypertension: Secondary | ICD-10-CM | POA: Diagnosis not present

## 2022-06-21 DIAGNOSIS — E1169 Type 2 diabetes mellitus with other specified complication: Secondary | ICD-10-CM | POA: Diagnosis not present

## 2022-06-21 DIAGNOSIS — Z5181 Encounter for therapeutic drug level monitoring: Secondary | ICD-10-CM

## 2022-06-21 DIAGNOSIS — N1832 Chronic kidney disease, stage 3b: Secondary | ICD-10-CM

## 2022-06-21 DIAGNOSIS — Z7984 Long term (current) use of oral hypoglycemic drugs: Secondary | ICD-10-CM | POA: Diagnosis not present

## 2022-06-21 DIAGNOSIS — E785 Hyperlipidemia, unspecified: Secondary | ICD-10-CM | POA: Diagnosis not present

## 2022-06-21 NOTE — Progress Notes (Unsigned)
Name: Cameron Glover   MRN: [id number]    DOB: 12-14-1950   Date:06/21/2022       Progress Note  Chief Complaint  Patient presents with   Follow-up   Diabetes     Subjective:   Cameron Glover is a 72 y.o. male, presents to clinic for routine f/up  Uncontrolled DM on farxiga 10 and metformin 500 mg, due for recheck on labs Metformin dose limited by renal function He sees nephrology q3 m just started injections - reviewed today with pt the nephrology OV notes from May 17th  Hypertension:  Currently managed on lisinopril-hydrochlorothiazide20-12.5 , amlodipine 10 mg and hydralazine 25 mg 3 times a day Pt reports good med compliance and denies any SE.   Blood pressure today is well controlled. BP Readings from Last 3 Encounters:  06/21/22 130/70  06/14/22 (!) 146/90  04/20/22 132/74   Pt denies CP, SOB, exertional sx, LE edema, palpitation, Ha's, visual disturbances, lightheadedness, hypotension, syncope. Dietary efforts for BP?  Low-salt and working on healthy diet for diabetes  Hyperlipidemia: Currently treated with Lipitor 40 mg, pt reports good med compliance Last Lipids: Due for lipids - Denies: Chest pain, shortness of breath, myalgias, claudication  Urinary symptoms have improved     Current Outpatient Medications:    amLODipine (NORVASC) 10 MG tablet, TAKE 1 TABLET BY MOUTH EVERY DAY, Disp: 90 tablet, Rfl: 1   atorvastatin (LIPITOR) 40 MG tablet, TAKE 1 TABLET BY MOUTH EVERY DAY, Disp: 90 tablet, Rfl: 3   FARXIGA 10 MG TABS tablet, Take 10 mg by mouth daily., Disp: , Rfl:    hydrALAZINE (APRESOLINE) 25 MG tablet, TAKE 1 TABLET BY MOUTH THREE TIMES A DAY, Disp: 270 tablet, Rfl: 0   lisinopril-hydrochlorothiazide (ZESTORETIC) 20-12.5 MG tablet, Take 2 tablets by mouth daily., Disp: 180 tablet, Rfl: 1   metFORMIN (GLUCOPHAGE) 500 MG tablet, Take 1 tablet (500 mg total) by mouth daily with breakfast., Disp: 90 tablet, Rfl: 1   Oyster Shell Calcium 500 MG TABS, Take 1  tablet by mouth 2 (two) times daily., Disp: , Rfl:    pantoprazole (PROTONIX) 40 MG tablet, TAKE 1 TABLET BY MOUTH EVERY DAY, Disp: 90 tablet, Rfl: 3  Patient Active Problem List   Diagnosis Date Noted   Stage 3b chronic kidney disease (HCC) 03/16/2022   Class 1 obesity with serious comorbidity and body mass index (BMI) of 30.0 to 30.9 in adult 03/16/2022   History of prostate cancer 08/17/2021   Grade 3 hypertensive retinopathy, bilateral 04/29/2021   Uncontrolled diabetes mellitus with hyperglycemia, without long-term current use of insulin (HCC) 03/30/2021   Snores 11/17/2020   Pseudophakia of left eye 10/13/2020   Anemia 08/11/2020   Gastroesophageal reflux disease 08/11/2020   Vitamin B12 deficiency 08/11/2020   Vitamin D deficiency 08/11/2020   Elevated parathyroid hormone 08/11/2020   Secondary hyperparathyroidism (HCC) 08/11/2020   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   Severe nonproliferative diabetic retinopathy of left eye, with macular edema, associated with type 2 diabetes mellitus (HCC) 05/14/2019   Severe nonproliferative diabetic retinopathy of right eye, with macular edema, associated with type 2 diabetes mellitus (HCC) 05/14/2019   Retinal hemorrhage of right eye 05/14/2019   Retinal hemorrhage of left eye 05/14/2019   Nuclear sclerotic cataract of right eye 05/14/2019   LVH (left ventricular hypertrophy) 03/23/2019   Hyperlipidemia associated with type 2 diabetes mellitus (HCC) 09/22/2018   CKD stage 3 due to type 2 diabetes mellitus (HCC) 09/22/2018   Essential hypertension  04/15/2018    Past Surgical History:  Procedure Laterality Date   COLONOSCOPY WITH PROPOFOL N/A 01/15/2019   Procedure: COLONOSCOPY WITH PROPOFOL;  Surgeon: Wyline Mood, MD;  Location: [facility] ENDOSCOPY;  Service: Gastroenterology;  Laterality: N/A;    Family History  Problem Relation Age of Onset   Kidney failure Sister    Cancer Sister    Diabetes Sister    Hypertension Sister     Hyperlipidemia Sister    Heart disease Sister    Kidney disease Sister    Cancer Brother    CAD Brother    Diabetes Brother    Hypertension Brother    Hyperlipidemia Brother    Heart disease Brother    Diabetes Other    Cancer Mother     Social History   Tobacco Use   Smoking status: Never    Passive exposure: Never   Smokeless tobacco: Never  Vaping Use   Vaping Use: Never used  Substance Use Topics   Alcohol use: Never   Drug use: Never     No Known Allergies  Health Maintenance  Topic Date Due   DTaP/Tdap/Td (1 - Tdap) Never done   OPHTHALMOLOGY EXAM  06/21/2022 (Originally 04/30/2022)   COVID-19 Vaccine (4 - 2023-24 season) 07/07/2022 (Originally 09/04/2021)   Zoster Vaccines- Shingrix (1 of 2) 09/21/2022 (Originally 11/14/1969)   INFLUENZA VACCINE  08/05/2022   HEMOGLOBIN A1C  09/12/2022   Diabetic kidney evaluation - eGFR measurement  03/04/2023   Diabetic kidney evaluation - Urine ACR  03/04/2023   FOOT EXAM  03/12/2023   Medicare Annual Wellness (AWV)  06/17/2023   Colonoscopy  01/15/2024   Pneumonia Vaccine 21+ Years old  Completed   Hepatitis C Screening  Completed   HPV VACCINES  Aged Out    Chart Review Today: I personally reviewed active problem list, medication list, allergies, family history, social history, health maintenance, notes from last encounter, lab results, imaging with the patient/caregiver today.   Review of Systems  Constitutional: Negative.   HENT: Negative.    Eyes: Negative.   Respiratory: Negative.    Cardiovascular: Negative.   Gastrointestinal: Negative.   Endocrine: Negative.   Genitourinary: Negative.   Musculoskeletal: Negative.   Skin: Negative.   Allergic/Immunologic: Negative.   Neurological: Negative.   Hematological: Negative.   Psychiatric/Behavioral: Negative.    All other systems reviewed and are negative.    Objective:   Vitals:   06/21/22 1004  BP: 130/70  Pulse: 99  Resp: 16  Temp: 98.1 F (36.7  C)  TempSrc: Oral  SpO2: 99%  Weight: 181 lb 8 oz (82.3 kg)  Height: 5\' 4"  (1.626 m)    Body mass index is 31.15 kg/m.  Physical Exam Vitals and nursing note reviewed.  Constitutional:      General: He is not in acute distress.    Appearance: Normal appearance. He is well-developed. He is not ill-appearing, toxic-appearing or diaphoretic.  HENT:     Head: Normocephalic and atraumatic.     Right Ear: External ear normal.     Left Ear: External ear normal.     Nose: Nose normal.  Eyes:     General: No scleral icterus.       Right eye: No discharge.        Left eye: No discharge.     Conjunctiva/sclera: Conjunctivae normal.  Neck:     Trachea: No tracheal deviation.  Cardiovascular:     Rate and Rhythm: Normal rate and regular rhythm.  Pulses: Normal pulses.     Heart sounds: Normal heart sounds. No murmur heard.    No friction rub. No gallop.  Pulmonary:     Effort: Pulmonary effort is normal. No respiratory distress.     Breath sounds: Normal breath sounds. No stridor. No wheezing, rhonchi or rales.  Abdominal:     General: Bowel sounds are normal.     Palpations: Abdomen is soft.  Musculoskeletal:     Right lower leg: No edema.     Left lower leg: No edema.  Skin:    General: Skin is warm and dry.     Coloration: Skin is not jaundiced or pale.     Findings: No rash.  Neurological:     Mental Status: He is alert. Mental status is at baseline.     Motor: No abnormal muscle tone.     Coordination: Coordination normal.     Gait: Gait normal.  Psychiatric:        Mood and Affect: Mood normal.        Behavior: Behavior normal.         Assessment & Plan:   Problem List Items Addressed This Visit       Cardiovascular and Mediastinum   Essential hypertension    Patient has history of very labile blood pressure currently it is well-controlled on lisinopril hydrochlorothiazide, hydralazine and amlodipine BP Readings from Last 3 Encounters:  06/21/22 130/70   06/14/22 (!) 146/90  04/20/22 132/74          Endocrine   Hyperlipidemia associated with type 2 diabetes mellitus (HCC)    Labs will be due in August so we will get them today since he is due for his A1c He has been compliant with atorvastatin 40 mg no side effects or concerns       Relevant Orders   Lipid panel (Completed)   CKD stage 3 due to type 2 diabetes mellitus (HCC)    Last estimated GFR per nephrology office visit notes was 93 He is improving his diabetic control, blood pressure is optimized, he is on Farxiga and ACE inhibitor, due for recheck of A1c today, just started injections with nephrology, will continue to follow nephrology office visits and review every 3 months      Uncontrolled diabetes mellitus with hyperglycemia, without long-term current use of insulin (HCC) - Primary    Previously well controlled, but most recent labs A1C has increased, here today for recheck of A1c      Lab Results  Component Value Date    HGBA1C 7.8 03/12/2022    He is on max metformin per nephrology/renal function -500 mg once a day Farxiga added, tolerating this well without any concerns or side effects       Relevant Orders   Hemoglobin A1c (Completed)   Lipid panel (Completed)   Microalbumin / creatinine urine ratio (Completed)   Other Visit Diagnoses     Encounter for medication monitoring       Relevant Orders   Hemoglobin A1c (Completed)   Lipid panel (Completed)   Microalbumin / creatinine urine ratio (Completed)        Return in about 3 months (around 09/21/2022) for dm f/up.   Danelle Berry, PA-C 06/21/22 10:25 AM

## 2022-06-22 ENCOUNTER — Other Ambulatory Visit: Payer: Self-pay | Admitting: Family Medicine

## 2022-06-22 DIAGNOSIS — I10 Essential (primary) hypertension: Secondary | ICD-10-CM

## 2022-06-22 DIAGNOSIS — E1129 Type 2 diabetes mellitus with other diabetic kidney complication: Secondary | ICD-10-CM

## 2022-06-22 LAB — HEMOGLOBIN A1C
Hgb A1c MFr Bld: 6.9 % of total Hgb — ABNORMAL HIGH (ref <5.7)
Mean Plasma Glucose: 151 mg/dL
eAG (mmol/L): 8.4 mmol/L

## 2022-06-22 LAB — MICROALBUMIN / CREATININE URINE RATIO
Creatinine, Urine: 68 mg/dL (ref 20–320)
Microalb Creat Ratio: 613 mg/g creat — ABNORMAL HIGH (ref <30)
Microalb, Ur: 41.7 mg/dL

## 2022-06-22 LAB — LIPID PANEL
Cholesterol: 100 mg/dL (ref <200)
HDL: 29 mg/dL — ABNORMAL LOW (ref >=40)
LDL Cholesterol (Calc): 44 mg/dL (calc)
Non-HDL Cholesterol (Calc): 71 mg/dL (calc) (ref <130)
Total CHOL/HDL Ratio: 3.4 (calc) (ref <5.0)
Triglycerides: 197 mg/dL — ABNORMAL HIGH (ref <150)

## 2022-06-22 MED ORDER — LISINOPRIL-HYDROCHLOROTHIAZIDE 20-12.5 MG PO TABS
2.0000 | ORAL_TABLET | Freq: Every day | ORAL | 1 refills | Status: DC
Start: 2022-06-22 — End: 2023-07-13

## 2022-06-22 MED ORDER — METFORMIN HCL 500 MG PO TABS
500.0000 mg | ORAL_TABLET | Freq: Every day | ORAL | 1 refills | Status: DC
Start: 2022-06-22 — End: 2023-02-07

## 2022-06-22 MED ORDER — AMLODIPINE BESYLATE 10 MG PO TABS: 10.0000 mg | ORAL_TABLET | Freq: Every day | ORAL | 1 refills | Status: DC

## 2022-06-22 MED ORDER — HYDRALAZINE HCL 25 MG PO TABS: 25.0000 mg | ORAL_TABLET | Freq: Three times a day (TID) | ORAL | 1 refills | Status: DC

## 2022-06-24 ENCOUNTER — Encounter: Payer: Self-pay | Admitting: Family Medicine

## 2022-06-24 NOTE — Assessment & Plan Note (Signed)
Previously well controlled, but most recent labs A1C has increased, here today for recheck of A1c      Lab Results  Component Value Date    HGBA1C 7.8 03/12/2022    He is on max metformin per nephrology/renal function -500 mg once a day Farxiga added, tolerating this well without any concerns or side effects

## 2022-06-24 NOTE — Assessment & Plan Note (Signed)
Labs will be due in August so we will get them today since he is due for his A1c He has been compliant with atorvastatin 40 mg no side effects or concerns

## 2022-06-24 NOTE — Assessment & Plan Note (Signed)
Patient has history of very labile blood pressure currently it is well-controlled on lisinopril hydrochlorothiazide, hydralazine and amlodipine BP Readings from Last 3 Encounters:  06/21/22 130/70  06/14/22 (!) 146/90  04/20/22 132/74

## 2022-06-24 NOTE — Assessment & Plan Note (Signed)
Last estimated GFR per nephrology office visit notes was 34 He is improving his diabetic control, blood pressure is optimized, he is on Farxiga and ACE inhibitor, due for recheck of A1c today, just started injections with nephrology, will continue to follow nephrology office visits and review every 3 months

## 2022-07-12 ENCOUNTER — Other Ambulatory Visit: Payer: Self-pay | Admitting: Family Medicine

## 2022-07-12 ENCOUNTER — Inpatient Hospital Stay (HOSPITAL_COMMUNITY): Admission: RE | Admit: 2022-07-12 | Payer: Medicare Other | Source: Ambulatory Visit

## 2022-07-12 DIAGNOSIS — E119 Type 2 diabetes mellitus without complications: Secondary | ICD-10-CM

## 2022-07-16 ENCOUNTER — Other Ambulatory Visit: Payer: Self-pay | Admitting: Urology

## 2022-07-16 DIAGNOSIS — C61 Malignant neoplasm of prostate: Secondary | ICD-10-CM

## 2022-07-19 ENCOUNTER — Encounter: Payer: Self-pay | Admitting: Urology

## 2022-07-19 ENCOUNTER — Other Ambulatory Visit: Payer: Medicare Other

## 2022-07-20 ENCOUNTER — Other Ambulatory Visit: Payer: Medicare Other

## 2022-07-20 DIAGNOSIS — C61 Malignant neoplasm of prostate: Secondary | ICD-10-CM

## 2022-07-21 ENCOUNTER — Ambulatory Visit: Payer: Medicare Other | Admitting: Urology

## 2022-07-21 ENCOUNTER — Encounter (HOSPITAL_COMMUNITY): Payer: Medicare Other

## 2022-07-21 VITALS — BP 133/75 | HR 108 | Ht 64.0 in | Wt 180.1 lb

## 2022-07-21 DIAGNOSIS — C61 Malignant neoplasm of prostate: Secondary | ICD-10-CM

## 2022-07-21 DIAGNOSIS — R232 Flushing: Secondary | ICD-10-CM | POA: Diagnosis not present

## 2022-07-21 LAB — PSA: Prostate Specific Ag, Serum: <0.1 ng/mL (ref 0.0–4.0)

## 2022-07-21 MED ORDER — LEUPROLIDE ACETATE (6 MONTH) 45 MG ~~LOC~~ KIT
45.0000 mg | PACK | Freq: Once | SUBCUTANEOUS | Status: AC
Start: 2022-07-21 — End: 2022-07-21
  Administered 2022-07-21: 45 mg via SUBCUTANEOUS

## 2022-07-21 NOTE — Progress Notes (Signed)
Marcelle Overlie Plume,acting as a scribe for Vanna Scotland, MD.,have documented all relevant documentation on the behalf of Vanna Scotland, MD,as directed by  Vanna Scotland, MD while in the presence of Vanna Scotland, MD.  07/21/2022 1:58 PM   Leighton Parody November 08, 1950 [id number]  Referring provider: Danelle Berry, PA-C [address] [place],  [place] [address]  Chief Complaint  Patient presents with   Prostate Cancer    Follow up    HPI: 72 year-old male with a personal history of prostate cancer who presents today for 6 month annual follow up.   He was initially diagnosed with prostate cancer in 2018. His PSA rose to 44 after he was lost to follow up. He also underwent microscopic hematuria evaluation. He had a PET scan for further staging that was denied. Ultimately, he underwent repeat biopsy here in the office that showed multiple cores of Gleason 2+3 and 3+4 involving 11 of 12 cores. His TRUS volume was 79 cc's. Ultimately, he never did have his PET scan. He had a bone scan that showed some moderate degenerative changes and a CT urogram that showed no obvious metastatic disease. Ultimately, he elected for external beam radiation and ADT. He received his ADT at the time of fiducial seed marker placement in January. He completed radiation with his last treatment on 03/31/2022.   His most recent PSA on 07/20/2022 was undetectable.  Today, he reports having hot flashes as a side effect from the ADT. His energy levels, appetite, and sexual function are okay. He reports experiencing mild urinary symptoms during radiation but notes that those have since resolved.   PMH: Past Medical History:  Diagnosis Date   Acute renal failure (ARF) (HCC)    AKI (acute kidney injury) (HCC) 09/02/2017   Anemia due to chronic kidney disease 04/15/2018   DM (diabetes mellitus) with complications (HCC) 09/02/2017   Elevated lipase 04/15/2018   Hyperglycemia without ketosis    Hyperlipidemia     Hypertension    Nuclear sclerotic cataract of left eye 05/14/2019    Surgical History: Past Surgical History:  Procedure Laterality Date   COLONOSCOPY WITH PROPOFOL N/A 01/15/2019   Procedure: COLONOSCOPY WITH PROPOFOL;  Surgeon: Wyline Mood, MD;  Location: [facility] ENDOSCOPY;  Service: Gastroenterology;  Laterality: N/A;    Home Medications:  Allergies as of 07/21/2022   No Known Allergies      Medication List        Accurate as of July 21, 2022  1:58 PM. If you have any questions, ask your nurse or doctor.          amLODipine 10 MG tablet Commonly known as: NORVASC Take 1 tablet (10 mg total) by mouth daily.   atorvastatin 40 MG tablet Commonly known as: LIPITOR TAKE 1 TABLET BY MOUTH EVERY DAY   Farxiga 10 MG Tabs tablet Generic drug: dapagliflozin propanediol Take 10 mg by mouth daily.   hydrALAZINE 25 MG tablet Commonly known as: APRESOLINE Take 1 tablet (25 mg total) by mouth 3 (three) times daily.   lisinopril-hydrochlorothiazide 20-12.5 MG tablet Commonly known as: ZESTORETIC Take 2 tablets by mouth daily.   metFORMIN 500 MG tablet Commonly known as: GLUCOPHAGE Take 1 tablet (500 mg total) by mouth daily with breakfast.   Oyster Shell Calcium 500 MG Tabs Take 1 tablet by mouth 2 (two) times daily.   pantoprazole 40 MG tablet Commonly known as: PROTONIX TAKE 1 TABLET BY MOUTH EVERY DAY        Family  History: Family History  Problem Relation Age of Onset   Kidney failure Sister    Cancer Sister    Diabetes Sister    Hypertension Sister    Hyperlipidemia Sister    Heart disease Sister    Kidney disease Sister    Cancer Brother    CAD Brother    Diabetes Brother    Hypertension Brother    Hyperlipidemia Brother    Heart disease Brother    Diabetes Other    Cancer Mother     Social History:  reports that he has never smoked. He has never been exposed to tobacco smoke. He has never used smokeless tobacco. He reports that he does not drink  alcohol and does not use drugs.   Physical Exam: BP 133/75   Pulse (!) 108   Ht 5\' 4"  (1.626 m)   Wt 180 lb 2 oz (81.7 kg)   BMI 30.92 kg/m   Constitutional:  Alert and oriented, No acute distress. HEENT: [place] AT, moist mucus membranes.  Trachea midline, no masses. Neurologic: Grossly intact, no focal deficits, moving all 4 extremities. Psychiatric: Normal mood and affect.  Assessment & Plan:    1. Prostate cancer - Due to his PSA being 44, he does fall into the high risk range based on this alone, although surgical pathology most consistent with an intermediate risk disease. - Favor continuing ADT ideally for 2 years - Eligard injection administered today, 6 mo depo  2. Hot flashes - Secondary to ADT - We will provide him a list of supplements in his AVS, including black cohosh, plant estrogens, etc.   Return in about 6 months (around 01/21/2023) for repeat PSA and Eligard injection.  I have reviewed the above documentation for accuracy and completeness, and I agree with the above.   Vanna Scotland, MD    [facility] [address] [place], [place] [address] [phone]

## 2022-07-21 NOTE — Progress Notes (Signed)
Eligard SubQ Injection   Due to Prostate Cancer patient is present today for a Eligard Injection.  Medication: Eligard 6 month Dose: 45 mg  Location: left arm Lot: [id number] Exp: 12/2023  Patient tolerated well, no complications were noted  Performed by: Shenay Torti H  Per Dr. Apolinar Junes patient is to continue therapy for 2 years-2026 . Patient's next follow up was scheduled for 01/18/2023. This appointment was scheduled using wheel and given to patient today along with reminder continue on Vitamin D 800-1000iu and Calcium 1000-1200mg  daily while on Androgen Deprivation Therapy.  PA approval dates:  12/30/21 - 12/31/22

## 2022-07-21 NOTE — Patient Instructions (Signed)
Black cohosh, L-theanine, St. John's wort, pollen extract, plant estrogens and Asian ginseng supplements for hot flashes

## 2022-07-22 DIAGNOSIS — E113412 Type 2 diabetes mellitus with severe nonproliferative diabetic retinopathy with macular edema, left eye: Secondary | ICD-10-CM | POA: Diagnosis not present

## 2022-07-22 DIAGNOSIS — H43821 Vitreomacular adhesion, right eye: Secondary | ICD-10-CM | POA: Diagnosis not present

## 2022-07-22 DIAGNOSIS — H35033 Hypertensive retinopathy, bilateral: Secondary | ICD-10-CM | POA: Diagnosis not present

## 2022-08-09 ENCOUNTER — Encounter (HOSPITAL_COMMUNITY): Payer: Medicare Other

## 2022-08-09 ENCOUNTER — Other Ambulatory Visit: Payer: Self-pay | Admitting: Family Medicine

## 2022-08-09 DIAGNOSIS — E1129 Type 2 diabetes mellitus with other diabetic kidney complication: Secondary | ICD-10-CM

## 2022-08-18 ENCOUNTER — Encounter (HOSPITAL_COMMUNITY): Payer: Medicare Other

## 2022-08-24 DIAGNOSIS — I129 Hypertensive chronic kidney disease with stage 1 through stage 4 chronic kidney disease, or unspecified chronic kidney disease: Secondary | ICD-10-CM | POA: Diagnosis not present

## 2022-08-24 DIAGNOSIS — E1122 Type 2 diabetes mellitus with diabetic chronic kidney disease: Secondary | ICD-10-CM | POA: Diagnosis not present

## 2022-08-24 DIAGNOSIS — N183 Chronic kidney disease, stage 3 unspecified: Secondary | ICD-10-CM | POA: Diagnosis not present

## 2022-08-24 DIAGNOSIS — D649 Anemia, unspecified: Secondary | ICD-10-CM | POA: Diagnosis not present

## 2022-08-24 DIAGNOSIS — N3289 Other specified disorders of bladder: Secondary | ICD-10-CM | POA: Diagnosis not present

## 2022-08-26 LAB — LAB REPORT - SCANNED: EGFR: 30

## 2022-09-02 ENCOUNTER — Ambulatory Visit: Payer: Medicare Other | Admitting: Family Medicine

## 2022-09-15 DIAGNOSIS — H35033 Hypertensive retinopathy, bilateral: Secondary | ICD-10-CM | POA: Diagnosis not present

## 2022-09-15 DIAGNOSIS — E113412 Type 2 diabetes mellitus with severe nonproliferative diabetic retinopathy with macular edema, left eye: Secondary | ICD-10-CM | POA: Diagnosis not present

## 2022-09-15 DIAGNOSIS — E113413 Type 2 diabetes mellitus with severe nonproliferative diabetic retinopathy with macular edema, bilateral: Secondary | ICD-10-CM | POA: Diagnosis not present

## 2022-09-15 DIAGNOSIS — H43821 Vitreomacular adhesion, right eye: Secondary | ICD-10-CM | POA: Diagnosis not present

## 2022-09-22 ENCOUNTER — Ambulatory Visit: Payer: Medicare Other | Admitting: Family Medicine

## 2022-09-29 ENCOUNTER — Ambulatory Visit: Payer: Medicare Other | Admitting: Family Medicine

## 2022-10-05 ENCOUNTER — Ambulatory Visit: Payer: Medicare Other | Admitting: Physician Assistant

## 2022-10-11 ENCOUNTER — Ambulatory Visit (INDEPENDENT_AMBULATORY_CARE_PROVIDER_SITE_OTHER): Payer: Medicare Other | Admitting: Physician Assistant

## 2022-10-11 ENCOUNTER — Encounter: Payer: Self-pay | Admitting: Physician Assistant

## 2022-10-11 VITALS — BP 138/72 | HR 96 | Temp 98.0°F | Resp 16 | Ht 64.0 in | Wt 176.0 lb

## 2022-10-11 DIAGNOSIS — Z23 Encounter for immunization: Secondary | ICD-10-CM

## 2022-10-11 DIAGNOSIS — E559 Vitamin D deficiency, unspecified: Secondary | ICD-10-CM | POA: Diagnosis not present

## 2022-10-11 DIAGNOSIS — E1122 Type 2 diabetes mellitus with diabetic chronic kidney disease: Secondary | ICD-10-CM

## 2022-10-11 DIAGNOSIS — E1165 Type 2 diabetes mellitus with hyperglycemia: Secondary | ICD-10-CM | POA: Diagnosis not present

## 2022-10-11 DIAGNOSIS — N2581 Secondary hyperparathyroidism of renal origin: Secondary | ICD-10-CM | POA: Diagnosis not present

## 2022-10-11 DIAGNOSIS — N183 Chronic kidney disease, stage 3 unspecified: Secondary | ICD-10-CM | POA: Diagnosis not present

## 2022-10-11 DIAGNOSIS — E1129 Type 2 diabetes mellitus with other diabetic kidney complication: Secondary | ICD-10-CM | POA: Diagnosis not present

## 2022-10-11 DIAGNOSIS — E1169 Type 2 diabetes mellitus with other specified complication: Secondary | ICD-10-CM | POA: Diagnosis not present

## 2022-10-11 DIAGNOSIS — I10 Essential (primary) hypertension: Secondary | ICD-10-CM

## 2022-10-11 DIAGNOSIS — R809 Proteinuria, unspecified: Secondary | ICD-10-CM

## 2022-10-11 DIAGNOSIS — E785 Hyperlipidemia, unspecified: Secondary | ICD-10-CM

## 2022-10-11 DIAGNOSIS — Z7984 Long term (current) use of oral hypoglycemic drugs: Secondary | ICD-10-CM

## 2022-10-11 MED ORDER — FARXIGA 10 MG PO TABS
10.0000 mg | ORAL_TABLET | Freq: Every day | ORAL | 1 refills | Status: DC
Start: 2022-10-11 — End: 2023-04-13

## 2022-10-11 NOTE — Assessment & Plan Note (Signed)
Chronic, historic condition Appears well-controlled on current medication regimen comprised of lisinopril-hydrochlorothiazide 40-25 mg p.o. daily, amlodipine 10 mg p.o. daily, hydralazine 25 mg p.o. 3 times daily He appears to be tolerating regimen well, continue current regimen Recommend monitoring blood pressure at home as able to ensure blood pressure is stable and within goal outside of the office Follow-up in 3 months or sooner if concerns arise

## 2022-10-11 NOTE — Patient Instructions (Signed)
Please make sure you are drinking at least 64 oz of water per day  This will help keep your kidneys healthy and can improve your dizziness We will keep you updated on the results of your labs and if the Marcelline Deist should be restarted.   Please let us know if you have further questions or concerns.

## 2022-10-11 NOTE — Assessment & Plan Note (Signed)
Chronic, historic condition Recheck lipid panel today-results to dictate further management For now continue atorvastatin 40 mg p.o. daily Follow-up in 6 months or sooner if concerns arise

## 2022-10-11 NOTE — Progress Notes (Signed)
Established Patient Office Visit  Name: Cameron Glover   MRN: [id number]    DOB: 08-Nov-1950   Date:10/11/2022  Today's Provider: Jacquelin Hawking, MHS, PA-C Introduced myself to the patient as a PA-C and provided education on APPs in clinical practice.         Subjective  Chief Complaint  Chief Complaint  Patient presents with   Diabetes    HPI    Diabetes, Type 2 - Last A1c 6.9 - Medications: He is taking Metformin 500 mg PO every day, he has been out of [place] for several weeks, he has called in for refill but reports it was not sent in  - Compliance: good  - Checking BG at home: Checking about 1-2 x per week, usually in 120-140 fasting AM  - Diet: He tries to be mindful of excess sugars, carbs  - Exercise: he tries to walk a few times week - Eye exam: UTD  - Foot exam: UTD - Microalbumin: utd  - Statin: on statin - PNA vaccine: completed  - Denies symptoms of hypoglycemia, polyuria, polydipsia, numbness extremities, foot ulcers/trauma  HYPERTENSION / HYPERLIPIDEMIA Satisfied with current treatment? yes Duration of hypertension: years BP monitoring frequency: not checking BP range:  BP medication side effects: no Past BP meds: amlodipine and lisinopril-HCTZ Duration of hyperlipidemia: years Cholesterol medication side effects: no Cholesterol supplements: none Past cholesterol medications: atorvastain (lipitor) Medication compliance: good compliance Aspirin: no Recent stressors: no Recurrent headaches: no Visual changes: no Palpitations: no Dyspnea: no Chest pain: no Lower extremity edema: no Dizzy/lightheaded: yes- when he stands up and walks outside, resolves on its own   He only drinks about 12 oz of water per day  Reviewed he needs to drink at least 64 oz of water per day      Patient Active Problem List   Diagnosis Date Noted   Stage 3b chronic kidney disease (HCC) 03/16/2022   Class 1 obesity with serious comorbidity and body mass index  (BMI) of 30.0 to 30.9 in adult 03/16/2022   History of prostate cancer 08/17/2021   Grade 3 hypertensive retinopathy, bilateral 04/29/2021   Uncontrolled diabetes mellitus with hyperglycemia, without long-term current use of insulin (HCC) 03/30/2021   Snores 11/17/2020   Pseudophakia of left eye 10/13/2020   Anemia 08/11/2020   Gastroesophageal reflux disease 08/11/2020   Vitamin B12 deficiency 08/11/2020   Vitamin D deficiency 08/11/2020   Elevated parathyroid hormone 08/11/2020   Secondary hyperparathyroidism (HCC) 08/11/2020   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   Severe nonproliferative diabetic retinopathy of left eye, with macular edema, associated with type 2 diabetes mellitus (HCC) 05/14/2019   Severe nonproliferative diabetic retinopathy of right eye, with macular edema, associated with type 2 diabetes mellitus (HCC) 05/14/2019   Retinal hemorrhage of right eye 05/14/2019   Retinal hemorrhage of left eye 05/14/2019   Nuclear sclerotic cataract of right eye 05/14/2019   LVH (left ventricular hypertrophy) 03/23/2019   Hyperlipidemia associated with type 2 diabetes mellitus (HCC) 09/22/2018   CKD stage 3 due to type 2 diabetes mellitus (HCC) 09/22/2018   Essential hypertension 04/15/2018   Controlled type 2 diabetes mellitus with microalbuminuria, without long-term current use of insulin (HCC) 09/02/2017    Past Surgical History:  Procedure Laterality Date   COLONOSCOPY WITH PROPOFOL N/A 01/15/2019   Procedure: COLONOSCOPY WITH PROPOFOL;  Surgeon: Wyline Mood, MD;  Location[facility] ENDOSCOPY;  Service: Gastroenterology;  Laterality: N/A;    Family History  Problem Relation Age of Onset   Kidney failure Sister    Cancer Sister    Diabetes Sister    Hypertension Sister    Hyperlipidemia Sister    Heart disease Sister    Kidney disease Sister    Cancer Brother    CAD Brother    Diabetes Brother    Hypertension Brother    Hyperlipidemia Brother    Heart disease  Brother    Diabetes Other    Cancer Mother     Social History   Tobacco Use   Smoking status: Never    Passive exposure: Never   Smokeless tobacco: Never  Substance Use Topics   Alcohol use: Never     Current Outpatient Medications:    amLODipine (NORVASC) 10 MG tablet, Take 1 tablet (10 mg total) by mouth daily., Disp: 90 tablet, Rfl: 1   atorvastatin (LIPITOR) 40 MG tablet, TAKE 1 TABLET BY MOUTH EVERY DAY, Disp: 90 tablet, Rfl: 3   hydrALAZINE (APRESOLINE) 25 MG tablet, Take 1 tablet (25 mg total) by mouth 3 (three) times daily., Disp: 270 tablet, Rfl: 1   lisinopril-hydrochlorothiazide (ZESTORETIC) 20-12.5 MG tablet, Take 2 tablets by mouth daily., Disp: 180 tablet, Rfl: 1   metFORMIN (GLUCOPHAGE) 500 MG tablet, Take 1 tablet (500 mg total) by mouth daily with breakfast., Disp: 90 tablet, Rfl: 1   Oyster Shell Calcium 500 MG TABS, Take 1 tablet by mouth 2 (two) times daily., Disp: , Rfl:    pantoprazole (PROTONIX) 40 MG tablet, TAKE 1 TABLET BY MOUTH EVERY DAY, Disp: 90 tablet, Rfl: 3   FARXIGA 10 MG TABS tablet, Take 1 tablet (10 mg total) by mouth daily., Disp: 90 tablet, Rfl: 1  No Known Allergies  I personally reviewed active problem list, medication list, allergies, health maintenance, notes from last encounter, lab results with the patient/caregiver today.   Review of Systems  Eyes:  Negative for blurred vision and double vision.  Respiratory:  Negative for shortness of breath and wheezing.   Cardiovascular:  Negative for chest pain, palpitations and leg swelling.  Musculoskeletal:  Negative for falls.  Neurological:  Positive for dizziness. Negative for tingling, tremors, loss of consciousness and headaches.      Objective  Vitals:   10/11/22 1042 10/11/22 1049  BP: 138/72   Pulse: (!) 113 96  Resp: 16   Temp: 98 F (36.7 C)   TempSrc: Oral   SpO2: 99%   Weight: 176 lb (79.8 kg)   Height: 5\' 4"  (1.626 m)     Body mass index is 30.21  kg/m.  Physical Exam Vitals reviewed.  Constitutional:      General: He is awake.     Appearance: Normal appearance.  HENT:     Head: Normocephalic and atraumatic.  Cardiovascular:     Rate and Rhythm: Normal rate and regular rhythm.     Pulses: Normal pulses.          Radial pulses are 2+ on the right side and 2+ on the left side.     Heart sounds: Normal heart sounds. No murmur heard.    No friction rub. No gallop.  Pulmonary:     Effort: Pulmonary effort is normal.     Breath sounds: Normal breath sounds. No decreased air movement. No decreased breath sounds, wheezing, rhonchi or rales.  Musculoskeletal:     Cervical back: Normal range of motion.     Right lower leg: No edema.     Left lower leg:  No edema.  Neurological:     Mental Status: He is alert.  Psychiatric:        Attention and Perception: Attention normal.        Mood and Affect: Mood and affect normal.        Speech: Speech normal.        Behavior: Behavior normal. Behavior is cooperative.      Recent Results (from the past 2160 hour(s))  PSA     Status: None   Collection Time: 07/20/22 11:21 AM  Result Value Ref Range   Prostate Specific Ag, Serum <0.1 0.0 - 4.0 ng/mL    Comment: **Verified by repeat analysis** Roche ECLIA methodology. According to the American Urological Association, Serum PSA should decrease and remain at undetectable levels after radical prostatectomy. The AUA defines biochemical recurrence as an initial PSA value 0.2 ng/mL or greater followed by a subsequent confirmatory PSA value 0.2 ng/mL or greater. Values obtained with different assay methods or kits cannot be used interchangeably. Results cannot be interpreted as absolute evidence of the presence or absence of malignant disease.   Lab report - scanned     Status: None   Collection Time: 08/26/22  9:44 AM  Result Value Ref Range   EGFR 30.0     Comment: Abstracted by HIM     PHQ2/9:    10/11/2022   10:40 AM 06/21/2022    10:04 AM 06/17/2022    2:45 PM 04/20/2022   10:39 AM 03/16/2022    9:31 AM  Depression screen PHQ 2/9  Decreased Interest 0 0 0 0 0  Down, Depressed, Hopeless 0 0 0 0 0  PHQ - 2 Score 0 0 0 0 0  Altered sleeping 0 0  0 0  Tired, decreased energy 0 0  0 0  Change in appetite 0 0  0 0  Feeling bad or failure about yourself  0 0  0 0  Trouble concentrating 0 0  0 0  Moving slowly or fidgety/restless 0 0  0 0  Suicidal thoughts 0 0  0 0  PHQ-9 Score 0 0  0 0  Difficult doing work/chores Not difficult at all Not difficult at all Not difficult at all Not difficult at all Not difficult at all      Fall Risk:    10/11/2022   10:40 AM 06/21/2022   10:04 AM 06/17/2022    2:44 PM 04/20/2022   10:38 AM 03/16/2022    9:31 AM  Fall Risk   Falls in the past year? 0 0 0 0 0  Number falls in past yr: 0 0 0 0 0  Injury with Fall? 0 0 0 0 0  Risk for fall due to : No Fall Risks No Fall Risks No Fall Risks No Fall Risks No Fall Risks  Follow up Falls prevention discussed;Education provided;Falls evaluation completed Education provided;Falls prevention discussed;Falls evaluation completed Education provided;Falls prevention discussed Falls prevention discussed;Education provided;Falls evaluation completed Falls prevention discussed;Education provided;Falls evaluation completed      Functional Status Survey: Is the patient deaf or have difficulty hearing?: No Does the patient have difficulty seeing, even when wearing glasses/contacts?: No Does the patient have difficulty concentrating, remembering, or making decisions?: No Does the patient have difficulty walking or climbing stairs?: No Does the patient have difficulty dressing or bathing?: No Does the patient have difficulty doing errands alone such as visiting a doctor's office or shopping?: No    Assessment & Plan  Problem List Items  Addressed This Visit       Cardiovascular and Mediastinum   Essential hypertension    Chronic, historic  condition Appears well-controlled on current medication regimen comprised of lisinopril-hydrochlorothiazide 40-25 mg p.o. daily, amlodipine 10 mg p.o. daily, hydralazine 25 mg p.o. 3 times daily He appears to be tolerating regimen well, continue current regimen Recommend monitoring blood pressure at home as able to ensure blood pressure is stable and within goal outside of the office Follow-up in 3 months or sooner if concerns arise      Relevant Orders   COMPLETE METABOLIC PANEL WITH GFR   CBC w/Diff/Platelet     Endocrine   Controlled type 2 diabetes mellitus with microalbuminuria, without long-term current use of insulin (HCC) - Primary    Chronic, historic condition Most recent A1c was 6.9 He is taking metformin 500 mg p.o. daily but he has been out of [place] for several weeks Refill of Farxiga sent today-will check CMP for kidney monitoring Recheck A1c today Results to dictate further management  Follow-up in 3 months or sooner if concerns arise      Relevant Medications   FARXIGA 10 MG TABS tablet   Other Relevant Orders   HgB A1c   Hyperlipidemia associated with type 2 diabetes mellitus (HCC)    Chronic, historic condition Recheck lipid panel today-results to dictate further management For now continue atorvastatin 40 mg p.o. daily Follow-up in 6 months or sooner if concerns arise      Relevant Medications   FARXIGA 10 MG TABS tablet   Other Relevant Orders   Lipid Profile   CKD stage 3 due to type 2 diabetes mellitus (HCC)    Chronic, ongoing Unable to view previous nephrology notes Will recheck CMP today He is on ACE inhibitor and was previously taking [place] but it was out for several weeks.  Refills of Farxiga sent today Continue collaboration with nephrology.  Will continue to try to optimize diabetes and blood pressure management to prevent further damage Follow-up in 3 months or sooner if concerns arise      Relevant Medications   FARXIGA 10 MG TABS  tablet   Secondary hyperparathyroidism (HCC)    Suspect this is chronic and ongoing Recheck PTH and calcium today-results to dictate further management       Relevant Orders   PTH, Intact and Calcium     Other   Vitamin D deficiency    Recheck labs today-results to dictate further management      Relevant Orders   Vitamin D (25 hydroxy)   Other Visit Diagnoses     Need for influenza vaccination       Relevant Orders   Flu Vaccine Trivalent High Dose (Fluad) (Completed)        Return in about 3 months (around 01/11/2023) for HTN, DM, HLD.   I, Anakaren Campion E Tallyn Holroyd, PA-C, have reviewed all documentation for this visit. The documentation on 10/11/22 for the exam, diagnosis, procedures, and orders are all accurate and complete.   Jacquelin Hawking, MHS, PA-C [facility]

## 2022-10-11 NOTE — Assessment & Plan Note (Addendum)
Chronic, ongoing Unable to view previous nephrology notes Will recheck CMP today He is on ACE inhibitor and was previously taking [place] but it was out for several weeks.  Refills of Farxiga sent today Continue collaboration with nephrology.  Will continue to try to optimize diabetes and blood pressure management to prevent further damage Follow-up in 3 months or sooner if concerns arise

## 2022-10-11 NOTE — Assessment & Plan Note (Addendum)
Chronic, historic condition Most recent A1c was 6.9 He is taking metformin 500 mg p.o. daily but he has been out of [place] for several weeks Refill of Farxiga sent today-will check CMP for kidney monitoring Recheck A1c today Results to dictate further management  Follow-up in 3 months or sooner if concerns arise

## 2022-10-11 NOTE — Assessment & Plan Note (Signed)
Suspect this is chronic and ongoing Recheck PTH and calcium today-results to dictate further management

## 2022-10-11 NOTE — Assessment & Plan Note (Signed)
Recheck labs today- results to dictate further management

## 2022-10-12 LAB — COMPLETE METABOLIC PANEL WITH GFR
AG Ratio: 1.7 (calc) (ref 1.0–2.5)
ALT: 11 U/L (ref 9–46)
AST: 12 U/L (ref 10–35)
Albumin: 4.5 g/dL (ref 3.6–5.1)
Alkaline phosphatase (APISO): 90 U/L (ref 35–144)
BUN/Creatinine Ratio: 24 (calc) — ABNORMAL HIGH (ref 6–22)
BUN: 48 mg/dL — ABNORMAL HIGH (ref 7–25)
CO2: 21 mmol/L (ref 20–32)
Calcium: 9.6 mg/dL (ref 8.6–10.3)
Chloride: 105 mmol/L (ref 98–110)
Creat: 2.04 mg/dL — ABNORMAL HIGH (ref 0.70–1.35)
Globulin: 2.7 g/dL (ref 1.9–3.7)
Glucose, Bld: 203 mg/dL — ABNORMAL HIGH (ref 65–99)
Potassium: 4.7 mmol/L (ref 3.5–5.3)
Sodium: 137 mmol/L (ref 135–146)
Total Bilirubin: 0.3 mg/dL (ref 0.2–1.2)
Total Protein: 7.2 g/dL (ref 6.1–8.1)
eGFR: 35 mL/min/{1.73_m2} — ABNORMAL LOW (ref >=60)

## 2022-10-12 LAB — HEMOGLOBIN A1C
Hgb A1c MFr Bld: 7.1 %{Hb} — ABNORMAL HIGH (ref <5.7)
Mean Plasma Glucose: 157 mg/dL
eAG (mmol/L): 8.7 mmol/L

## 2022-10-12 LAB — CBC WITH DIFFERENTIAL/PLATELET
Absolute Monocytes: 380 {cells}/uL (ref 200–950)
Basophils Absolute: 52 {cells}/uL (ref 0–200)
Basophils Relative: 1.3 %
Eosinophils Absolute: 200 {cells}/uL (ref 15–500)
Eosinophils Relative: 5 %
HCT: 28.1 % — ABNORMAL LOW (ref 38.5–50.0)
Hemoglobin: 9.2 g/dL — ABNORMAL LOW (ref 13.2–17.1)
Lymphs Abs: 560 {cells}/uL — ABNORMAL LOW (ref 850–3900)
MCH: 31.2 pg (ref 27.0–33.0)
MCHC: 32.7 g/dL (ref 32.0–36.0)
MCV: 95.3 fL (ref 80.0–100.0)
MPV: 10.4 fL (ref 7.5–12.5)
Monocytes Relative: 9.5 %
Neutro Abs: 2808 {cells}/uL (ref 1500–7800)
Neutrophils Relative %: 70.2 %
Platelets: 287 10*3/uL (ref 140–400)
RBC: 2.95 10*6/uL — ABNORMAL LOW (ref 4.20–5.80)
RDW: 12.3 % (ref 11.0–15.0)
Total Lymphocyte: 14 %
WBC: 4 10*3/uL (ref 3.8–10.8)

## 2022-10-12 LAB — LIPID PANEL
Cholesterol: 98 mg/dL (ref <200)
HDL: 35 mg/dL — ABNORMAL LOW (ref >=40)
LDL Cholesterol (Calc): 41 mg/dL
Non-HDL Cholesterol (Calc): 63 mg/dL (ref <130)
Total CHOL/HDL Ratio: 2.8 (calc) (ref <5.0)
Triglycerides: 137 mg/dL (ref <150)

## 2022-10-12 LAB — VITAMIN D 25 HYDROXY (VIT D DEFICIENCY, FRACTURES): Vit D, 25-Hydroxy: 33 ng/mL (ref 30–100)

## 2022-10-12 LAB — PTH, INTACT AND CALCIUM
Calcium: 9.6 mg/dL (ref 8.6–10.3)
PTH: 83 pg/mL — ABNORMAL HIGH (ref 16–77)

## 2022-10-13 NOTE — Progress Notes (Signed)
Your labs are back Your A1c is 7.1 which is above goal but very close.  It is recommended that people with diabetes keep their A1c less than 7.0.  I suspect this will improve once you restart your Marcelline Deist since we sent in fresh refills.  At this time I recommend taking your metformin as directed and the [place] once per day and we will recheck your levels in 3 months Your kidney function appears to be stable, still demonstrates chronic kidney disease.  Please make sure that you are staying well-hydrated and avoid holding your urine for prolonged periods, keeping your blood pressure and glucose and check Your electrolytes and liver function appear to be normal Your CBC does show anemia but this appears to be improved from your previous lab work.  This is very likely secondary to chronic kidney disease.  At this time I recommend we continue to monitor. Your cholesterol looks great Your vitamin D is in normal range but it still little low.  I would recommend that you supplement with 600 to 800 units of vitamin D per day to help improve this a little bit. Your parathyroid hormone was mildly elevated.  Your calcium was normal which is good.  At this time I do not recommend intervention but we should continue to monitor your parathyroid hormone levels routinely.  I would recommend checking them every 3 months when you follow-up for your diabetes management.  If you start having pain with urination, joint or bone pain please let us know.

## 2022-12-07 DIAGNOSIS — I129 Hypertensive chronic kidney disease with stage 1 through stage 4 chronic kidney disease, or unspecified chronic kidney disease: Secondary | ICD-10-CM | POA: Diagnosis not present

## 2022-12-07 DIAGNOSIS — D649 Anemia, unspecified: Secondary | ICD-10-CM | POA: Diagnosis not present

## 2022-12-07 DIAGNOSIS — N3289 Other specified disorders of bladder: Secondary | ICD-10-CM | POA: Diagnosis not present

## 2022-12-07 DIAGNOSIS — E1122 Type 2 diabetes mellitus with diabetic chronic kidney disease: Secondary | ICD-10-CM | POA: Diagnosis not present

## 2022-12-07 DIAGNOSIS — N183 Chronic kidney disease, stage 3 unspecified: Secondary | ICD-10-CM | POA: Diagnosis not present

## 2022-12-08 DIAGNOSIS — H43821 Vitreomacular adhesion, right eye: Secondary | ICD-10-CM | POA: Diagnosis not present

## 2022-12-08 DIAGNOSIS — E113312 Type 2 diabetes mellitus with moderate nonproliferative diabetic retinopathy with macular edema, left eye: Secondary | ICD-10-CM | POA: Diagnosis not present

## 2022-12-08 DIAGNOSIS — H35033 Hypertensive retinopathy, bilateral: Secondary | ICD-10-CM | POA: Diagnosis not present

## 2023-01-07 ENCOUNTER — Telehealth: Payer: Self-pay

## 2023-01-07 NOTE — Telephone Encounter (Signed)
 Eligard PA Approval:  Dates: 01/07/23 - 01/07/24  Auth# [id number]

## 2023-01-11 ENCOUNTER — Other Ambulatory Visit: Payer: Medicare Other

## 2023-01-13 ENCOUNTER — Ambulatory Visit (INDEPENDENT_AMBULATORY_CARE_PROVIDER_SITE_OTHER): Payer: Medicare Other | Admitting: Nurse Practitioner

## 2023-01-13 ENCOUNTER — Encounter: Payer: Self-pay | Admitting: Nurse Practitioner

## 2023-01-13 VITALS — BP 140/80 | HR 103 | Temp 97.8°F | Resp 18 | Ht 64.0 in | Wt 184.5 lb

## 2023-01-13 DIAGNOSIS — E1129 Type 2 diabetes mellitus with other diabetic kidney complication: Secondary | ICD-10-CM

## 2023-01-13 DIAGNOSIS — C61 Malignant neoplasm of prostate: Secondary | ICD-10-CM | POA: Insufficient documentation

## 2023-01-13 DIAGNOSIS — K219 Gastro-esophageal reflux disease without esophagitis: Secondary | ICD-10-CM

## 2023-01-13 DIAGNOSIS — Z8546 Personal history of malignant neoplasm of prostate: Secondary | ICD-10-CM

## 2023-01-13 DIAGNOSIS — E1122 Type 2 diabetes mellitus with diabetic chronic kidney disease: Secondary | ICD-10-CM

## 2023-01-13 DIAGNOSIS — E66811 Obesity, class 1: Secondary | ICD-10-CM

## 2023-01-13 DIAGNOSIS — E113412 Type 2 diabetes mellitus with severe nonproliferative diabetic retinopathy with macular edema, left eye: Secondary | ICD-10-CM

## 2023-01-13 DIAGNOSIS — I10 Essential (primary) hypertension: Secondary | ICD-10-CM

## 2023-01-13 DIAGNOSIS — N2581 Secondary hyperparathyroidism of renal origin: Secondary | ICD-10-CM | POA: Diagnosis not present

## 2023-01-13 DIAGNOSIS — N184 Chronic kidney disease, stage 4 (severe): Secondary | ICD-10-CM | POA: Diagnosis not present

## 2023-01-13 DIAGNOSIS — D631 Anemia in chronic kidney disease: Secondary | ICD-10-CM

## 2023-01-13 DIAGNOSIS — E113411 Type 2 diabetes mellitus with severe nonproliferative diabetic retinopathy with macular edema, right eye: Secondary | ICD-10-CM | POA: Diagnosis not present

## 2023-01-13 DIAGNOSIS — Z6830 Body mass index (BMI) 30.0-30.9, adult: Secondary | ICD-10-CM

## 2023-01-13 DIAGNOSIS — E1169 Type 2 diabetes mellitus with other specified complication: Secondary | ICD-10-CM

## 2023-01-13 LAB — POCT GLYCOSYLATED HEMOGLOBIN (HGB A1C): Hemoglobin A1C: 6.6 % — AB (ref 4.0–5.6)

## 2023-01-13 NOTE — Progress Notes (Signed)
 BP (!) 140/80   Pulse (!) 103   Temp 97.8 F (36.6 C)   Resp 18   Ht 5' 4 (1.626 m)   Wt 184 lb 8 oz (83.7 kg)   SpO2 99%   BMI 31.67 kg/m    Subjective:    Patient ID: Cameron Glover, male    DOB: 09/08/1950, 73 y.o.   MRN: [id number]  HPI: Cameron Glover is a 73 y.o. male  Chief Complaint  Patient presents with   Medical Management of Chronic Issues    Discussed the use of AI scribe software for clinical note transcription with the patient, who gave verbal consent to proceed.  History of Present Illness   The patient, with a history of hypertension, GERD, type two diabetes with microalbuminuria, chronic kidney disease, diabetic retinopathy of both eyes, hyperparathyroidism, hyperlipidemia, obesity, and prostate cancer, presents for a routine follow-up. He reports good adherence to his medication regimen, which includes amlodipine , atorvastatin , Farxiga , Adralazine, lisinopril -hydrochlorothiazide , metformin , and pantoprazole . He denies needing any refills at this time.  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 His most recent A1c was 6.6, which he is pleased with. He occasionally checks his blood sugar at home, which has been running well. He reports his blood pressure was slightly elevated today, which he attributes to not having breakfast yet and missing his morning dose of blood pressure medicine.  He has been followed by urology for prostate cancer. His last PTH was elevated, but his calcium  was normal. He is aware that his PTH will be rechecked today.       10/11/2022   10:40 AM 06/21/2022   10:04 AM 06/17/2022    2:45 PM  Depression screen PHQ 2/9  Decreased Interest 0 0 0  Down, Depressed, Hopeless 0 0 0  PHQ - 2 Score 0 0 0  Altered sleeping 0 0   Tired, decreased energy 0 0   Change in appetite 0 0   Feeling bad or failure about yourself  0 0   Trouble concentrating 0 0   Moving slowly or fidgety/restless 0 0   Suicidal  thoughts 0 0   PHQ-9 Score 0 0   Difficult doing work/chores Not difficult at all Not difficult at all Not difficult at all    Relevant past medical, surgical, family and social history reviewed and updated as indicated. Interim medical history since our last visit reviewed. Allergies and medications reviewed and updated.  Review of Systems  Constitutional: Negative for fever or weight change.  Respiratory: Negative for cough and shortness of breath.   Cardiovascular: Negative for chest pain or palpitations.  Gastrointestinal: Negative for abdominal pain, no bowel changes.  Musculoskeletal: Negative for gait problem or joint swelling.  Skin: Negative for rash.  Neurological: Negative for dizziness or headache.  No other specific complaints in a complete review of systems (except as listed in HPI above).      Objective:    BP (!) 140/80   Pulse (!) 103   Temp 97.8 F (36.6 C)   Resp 18   Ht 5' 4 (1.626 m)   Wt 184 lb 8 oz (83.7 kg)   SpO2 99%   BMI 31.67 kg/m   BP Readings from Last 3 Encounters:  01/13/23 (!) 140/80  10/11/22 138/72  07/21/22 133/75     Wt Readings from Last 3 Encounters:  01/13/23 184 lb 8 oz (83.7 kg)  10/11/22 176 lb (79.8 kg)  07/21/22 180  lb 2 oz (81.7 kg)    Physical Exam  Constitutional: Patient appears well-developed and well-nourished. Obese  No distress.  HEENT: head atraumatic, normocephalic, pupils equal and reactive to light, neck supple Cardiovascular: Normal rate, regular rhythm and normal heart sounds.  No murmur heard. No BLE edema. Pulmonary/Chest: Effort normal and breath sounds normal. No respiratory distress. Abdominal: Soft.  There is no tenderness. Psychiatric: Patient has a normal mood and affect. behavior is normal. Judgment and thought content normal.  Results for orders placed or performed in visit on 01/13/23  POCT HgB A1C   Collection Time: 01/13/23 10:47 AM  Result Value Ref Range   Hemoglobin A1C 6.6 (A) 4.0 - 5.6  %   HbA1c POC (<> result, manual entry)     HbA1c, POC (prediabetic range)     HbA1c, POC (controlled diabetic range)     Last metabolic panel Lab Results  Component Value Date   GLUCOSE 203 (H) 10/11/2022   NA 137 10/11/2022   K 4.7 10/11/2022   CL 105 10/11/2022   CO2 21 10/11/2022   BUN 48 (H) 10/11/2022   CREATININE 2.04 (H) 10/11/2022   EGFR 35 (L) 10/11/2022   CALCIUM  9.6 10/11/2022   CALCIUM  9.6 10/11/2022   PHOS 5.7 (H) 09/02/2017   PROT 7.2 10/11/2022   ALBUMIN 4.3 05/21/2022   BILITOT 0.3 10/11/2022   ALKPHOS 77 09/02/2017   AST 12 10/11/2022   ALT 11 10/11/2022   ANIONGAP 6 09/04/2017   Last lipids Lab Results  Component Value Date   CHOL 98 10/11/2022   HDL 35 (L) 10/11/2022   LDLCALC 41 10/11/2022   TRIG 137 10/11/2022   CHOLHDL 2.8 10/11/2022   Last hemoglobin A1c Lab Results  Component Value Date   HGBA1C 6.6 (A) 01/13/2023        Latest Ref Rng & Units 10/11/2022   11:40 AM 06/14/2022   11:13 AM 05/21/2022   12:00 AM  CBC  WBC 3.8 - 10.8 Thousand/uL 4.0     Hemoglobin 13.2 - 17.1 g/dL 9.2  8.8  8.3      Hematocrit 38.5 - 50.0 % 28.1     Platelets 140 - 400 Thousand/uL 287        This result is from an external source.       Assessment & Plan:   Problem List Items Addressed This Visit       Cardiovascular and Mediastinum   Essential hypertension - Primary     Digestive   Gastroesophageal reflux disease     Endocrine   Controlled type 2 diabetes mellitus with microalbuminuria, without long-term current use of insulin  (HCC)   Relevant Orders   POCT HgB A1C (Completed)   Hyperlipidemia associated with type 2 diabetes mellitus (HCC)   CKD stage 3 due to type 2 diabetes mellitus (HCC)   Severe nonproliferative diabetic retinopathy of left eye, with macular edema, associated with type 2 diabetes mellitus (HCC)   Severe nonproliferative diabetic retinopathy of right eye, with macular edema, associated with type 2 diabetes mellitus (HCC)    Secondary hyperparathyroidism (HCC)   Relevant Orders   PTH, intact and calcium      Genitourinary   Prostate cancer (HCC)     Other   Anemia due to stage 4 chronic kidney disease (HCC)   History of prostate cancer   Class 1 obesity with serious comorbidity and body mass index (BMI) of 30.0 to 30.9 in adult     Assessment and Plan  Type 2 Diabetes Mellitus with Microalbuminuria Well controlled with A1C of 6.6. Patient is on Metformin  500mg  daily and Farxiga  10mg  daily. -Continue current regimen.  Hypertension Elevated blood pressure today (140/84). Patient is on Amlodipine  10mg  daily, Hydralazine  25mg  TID, and Lisinopril -Hydrochlorothiazide  20-12.5mg  daily. Patient reported not taking his blood pressure medication this morning. -Continue current regimen.  Hyperparathyroidism Last PTH was elevated at 83 on 10/11/2022, but calcium  was normal. -Check PTH today.  Hyperlipidemia Patient is on Atorvastatin  40mg  daily. -Continue current regimen.  GERD Patient is on Pantoprazole  40mg  daily. -Continue current regimen.  Chronic Kidney Disease, Stage 4 History of anemia due to CKD. -No changes to current management discussed.  Prostate Cancer Patient is followed by Urology. -No changes to current management discussed.  Obesity Current BMI is 31.67. -work on lifestyle modification  Diabetic Retinopathy Both eyes affected. -continue diabetic medication  Follow-up Lab work to be done today to check PTH. Results will be discussed with patient tomorrow.        Follow up plan: Return in about 3 months (around 04/13/2023) for follow up with Leisa .

## 2023-01-14 LAB — PTH, INTACT AND CALCIUM
Calcium: 7 mg/dL — ABNORMAL LOW (ref 8.6–10.3)
PTH: 77 pg/mL (ref 16–77)

## 2023-01-17 ENCOUNTER — Other Ambulatory Visit: Payer: Medicare Other

## 2023-01-17 DIAGNOSIS — C61 Malignant neoplasm of prostate: Secondary | ICD-10-CM

## 2023-01-18 ENCOUNTER — Ambulatory Visit: Payer: Medicare Other | Admitting: Urology

## 2023-01-18 VITALS — BP 138/89 | HR 103 | Ht 70.0 in | Wt 185.0 lb

## 2023-01-18 DIAGNOSIS — C61 Malignant neoplasm of prostate: Secondary | ICD-10-CM

## 2023-01-18 LAB — PSA: Prostate Specific Ag, Serum: <0.1 ng/mL (ref 0.0–4.0)

## 2023-01-18 MED ORDER — LEUPROLIDE ACETATE (6 MONTH) 45 MG ~~LOC~~ KIT
45.0000 mg | PACK | Freq: Once | SUBCUTANEOUS | Status: AC
Start: 2023-01-18 — End: 2023-01-18
  Administered 2023-01-18: 45 mg via SUBCUTANEOUS

## 2023-01-18 NOTE — Progress Notes (Signed)
 I,Amy L Pierron,acting as a scribe for Rosina Riis, MD.,have documented all relevant documentation on the behalf of Rosina Riis, MD,as directed by  Rosina Riis, MD while in the presence of Rosina Riis, MD.  01/18/2023 4:48 PM   Cameron Glover Jan 27, 1950 [id number]  Referring provider: Leavy Mole, PA-C [address] [place],  [place] [address]  Chief Complaint  Patient presents with   Prostate Cancer    HPI: 73 year-old male with a personal history of prostate cancer returns today for a six month follow-up with PSA.  He was initially diagnosed with prostate cancer in 2018. His PSA rose to 44 after he was lost to follow up. He also underwent microscopic hematuria evaluation. He had a PET scan for further staging that was denied. 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 His TRUS volume was 79 cc's. Ultimately, he never did have his PET scan. He had a bone scan that showed some moderate degenerative changes and a CT urogram that showed no obvious metastatic disease. Ultimately, he elected for external beam radiation and ADT. He received his ADT at the time of fiducial seed marker placement in January. He completed radiation with his last treatment on 03/31/2022.   His most recent PSA from 01/17/2023 remains undetectable.  He is doing well overall. He still has occasional hot flashes and sometimes wakes up sweating at night.   PMH: Past Medical History:  Diagnosis Date   Acute renal failure (ARF) (HCC)    AKI (acute kidney injury) (HCC) 09/02/2017   Anemia due to chronic kidney disease 04/15/2018   DM (diabetes mellitus) with complications (HCC) 09/02/2017   Elevated lipase 04/15/2018   Hyperglycemia without ketosis    Hyperlipidemia    Hypertension    Nuclear sclerotic cataract of left eye 05/14/2019    Surgical History: Past Surgical History:  Procedure Laterality  Date   COLONOSCOPY WITH PROPOFOL  N/A 01/15/2019   Procedure: COLONOSCOPY WITH PROPOFOL ;  Surgeon: Therisa Bi, MD;  Location: [facility] END[facility];  Service: Gastroenterology;  Laterality: N/A;    Home Medications:  Allergies as of 01/18/2023   No Known Allergies      Medication List        Accurate as of January 18, 2023  4:48 PM. If you have any questions, ask your nurse or doctor.          amLODipine  10 MG tablet Commonly known as: NORVASC  Take 1 tablet (10 mg total) by mouth daily.   atorvastatin  40 MG tablet Commonly known as: LIPITOR TAKE 1 TABLET BY MOUTH EVERY DAY   Farxiga  10 MG Tabs tablet Generic drug: dapagliflozin propanediol  Take 1 tablet (10 mg total) by mouth daily.   hydrALAZINE  25 MG tablet Commonly known as: APRESOLINE  Take 1 tablet (25 mg total) by mouth 3 (three) times daily.   lisinopril -hydrochlorothiazide  20-12.5 MG tablet Commonly known as: ZESTORETIC  Take 2 tablets by mouth daily.   metFORMIN  500 MG tablet Commonly known as: GLUCOPHAGE  Take 1 tablet (500 mg total) by mouth daily with breakfast.   Oyster Shell Calcium  500 MG Tabs Take 1 tablet by mouth 2 (two) times daily.   pantoprazole  40 MG tablet Commonly known as: PROTONIX  TAKE 1 TABLET BY MOUTH EVERY DAY        Family History: Family History  Problem Relation Age of Onset   Kidney failure Sister    Cancer Sister    Diabetes  Sister    Hypertension Sister    Hyperlipidemia Sister    Heart disease Sister    Kidney disease Sister    Cancer Brother    CAD Brother    Diabetes Brother    Hypertension Brother    Hyperlipidemia Brother    Heart disease Brother    Diabetes Other    Cancer Mother     Social History:  reports that he has never smoked. He has never been exposed to tobacco smoke. He has never used smokeless tobacco. He reports that he does not drink alcohol and does not use drugs.   Physical Exam: BP 138/89   Pulse (!) 103   Ht 5' 10 (1.778 m)   Wt 185 lb  (83.9 kg)   BMI 26.54 kg/m   Constitutional:  Alert and oriented, No acute distress. HEENT: [place] AT, moist mucus membranes.  Trachea midline, no masses. Neurologic: Grossly intact, no focal deficits, moving all 4 extremities. Psychiatric: Normal mood and affect.   Assessment & Plan:    1. Prostate cancer  - Technically high risk based on PSA. However currently his PSA is undetectable which is reassuring.  - Continue ADT and Eligard  given today. Plan to continue to at least the two year mark.  - Encouraged taking a calcium  and Vitamin D  supplement.  Return in about 6 months (around 07/18/2023) for PSA and Eligard .  I have reviewed the above documentation for accuracy and completeness, and I agree with the above.   Rosina Riis, MD   [facility] [address] [place], [place] [address] [phone]

## 2023-01-18 NOTE — Progress Notes (Signed)
 Eligard  SubQ Injection   Due to Prostate Cancer patient is present today for a Eligard  Injection.  Medication: Eligard  6 month Dose: 45 mg  Location: right arm Lot: [id number] Exp: 06/2024  Patient tolerated well, no complications were noted  Performed by: Jessina Marse H RMA  Per Dr. Penne patient is to continue therapy until 2026 . Patient's next follow up was scheduled for 07/12/2023. This appointment was scheduled using wheel and given to patient today along with reminder continue on Vitamin D  800-1000iu and Calcium  1000-1200mg  daily while on Androgen Deprivation Therapy.  PA approval dates:  01/07/23-01/07/24

## 2023-02-05 ENCOUNTER — Other Ambulatory Visit: Payer: Self-pay | Admitting: Family Medicine

## 2023-02-05 DIAGNOSIS — E1129 Type 2 diabetes mellitus with other diabetic kidney complication: Secondary | ICD-10-CM

## 2023-02-07 NOTE — Telephone Encounter (Signed)
Requested Prescriptions  Pending Prescriptions Disp Refills   metFORMIN (GLUCOPHAGE) 500 MG tablet [Pharmacy Med Name: METFORMIN HCL 500 MG TABLET] 90 tablet 0    Sig: TAKE 1 TABLET BY MOUTH EVERY DAY WITH BREAKFAST     Endocrinology:  Diabetes - Biguanides Failed - 02/07/2023  9:14 AM      Failed - Cr in normal range and within 360 days    Creat  Date Value Ref Range Status  10/11/2022 2.04 (H) 0.70 - 1.35 mg/dL Final   Creatinine, Urine  Date Value Ref Range Status  06/21/2022 68 20 - 320 mg/dL Final         Failed - eGFR in normal range and within 360 days    GFR, Est African American  Date Value Ref Range Status  02/11/2020 58 (L) > OR = 60 mL/min/1.60m2 Final   GFR, Est Non African American  Date Value Ref Range Status  02/11/2020 50 (L) > OR = 60 mL/min/1.42m2 Final   eGFR  Date Value Ref Range Status  10/11/2022 35 (L) > OR = 60 mL/min/1.60m2 Final         Failed - B12 Level in normal range and within 720 days    Vitamin B-12  Date Value Ref Range Status  08/11/2020 250 200 - 1,100 pg/mL Final    Comment:    . Please Note: Although the reference range for vitamin B12 is [phone] pg/mL, it has been reported that between 5 and 10% of patients with values between 200 and 400 pg/mL may experience neuropsychiatric and hematologic abnormalities due to occult B12 deficiency; less than 1% of patients with values above 400 pg/mL will have symptoms. .          Passed - HBA1C is between 0 and 7.9 and within 180 days    Hemoglobin A1C  Date Value Ref Range Status  01/13/2023 6.6 (A) 4.0 - 5.6 % Final  03/12/2022 7.8  Final    Comment:    House call   Hgb A1c MFr Bld  Date Value Ref Range Status  10/11/2022 7.1 (H) <5.7 % of total Hgb Final    Comment:    For someone without known diabetes, a hemoglobin A1c value of 6.5% or greater indicates that they may have  diabetes and this should be confirmed with a follow-up  test. . For someone with known diabetes, a  value <7% indicates  that their diabetes is well controlled and a value  greater than or equal to 7% indicates suboptimal  control. A1c targets should be individualized based on  duration of diabetes, age, comorbid conditions, and  other considerations. . Currently, no consensus exists regarding use of hemoglobin A1c for diagnosis of diabetes for children. Verna Czech - Valid encounter within last 6 months    Recent Outpatient Visits           3 weeks ago Essential hypertension   [facility] Della Goo F, FNP   3 months ago Controlled type 2 diabetes mellitus with microalbuminuria, without long-term current use of insulin [facility])   [facility] Mecum, Erin E, PA-C   7 months ago Uncontrolled diabetes mellitus with hyperglycemia, without long-term current use of insulin [facility])   [facility] Danelle Berry, PA-C   9 months ago Uncontrolled diabetes mellitus with hyperglycemia, without long-term current use of insulin [facility])   [place] [facility]  Danelle Berry, PA-C   10 months ago Uncontrolled diabetes mellitus with hyperglycemia, without long-term current use of insulin [facility])   [place] [facility] Danelle Berry, PA-C       Future Appointments             In 2 months Danelle Berry, PA-C [facility]   In 5 months Vanna Scotland, MD [facility] [place]            Passed - CBC within normal limits and completed in the last 12 months    WBC  Date Value Ref Range Status  10/11/2022 4.0 3.8 - 10.8 Thousand/uL Final   RBC  Date Value Ref Range Status  10/11/2022 2.95 (L) 4.20 - 5.80 Million/uL Final   Hemoglobin  Date Value Ref Range Status  10/11/2022 9.2 (L) 13.2 - 17.1 g/dL Final   HCT  Date Value Ref Range Status  10/11/2022 28.1 (L) 38.5 - 50.0 % Final   MCHC  Date Value Ref Range Status  10/11/2022  32.7 32.0 - 36.0 g/dL Final    Comment:    For adults, a slight decrease in the calculated MCHC value (in the range of 30 to 32 g/dL) is most likely not clinically significant; however, it should be interpreted with caution in correlation with other red cell parameters and the patient's clinical condition.    [facility]  Date Value Ref Range Status  10/11/2022 31.2 27.0 - 33.0 pg Final   MCV  Date Value Ref Range Status  10/11/2022 95.3 80.0 - 100.0 fL Final   No results found for: "PLTCOUNTKUC", "LABPLAT", "POCPLA" RDW  Date Value Ref Range Status  10/11/2022 12.3 11.0 - 15.0 % Final

## 2023-02-09 ENCOUNTER — Other Ambulatory Visit: Payer: Self-pay | Admitting: Family Medicine

## 2023-02-09 DIAGNOSIS — I10 Essential (primary) hypertension: Secondary | ICD-10-CM

## 2023-02-11 NOTE — Telephone Encounter (Signed)
 Requested medications are due for refill today.  yes  Requested medications are on the active medications list.  yes  Last refill. 06/22/2022 #270 1 rf  Future visit scheduled.   yes  Notes to clinic.  Missing labs.     Requested Prescriptions  Pending Prescriptions Disp Refills   hydrALAZINE  (APRESOLINE ) 25 MG tablet [Pharmacy Med Name: HYDRALAZINE  25 MG TABLET] 270 tablet 1    Sig: TAKE 1 TABLET BY MOUTH THREE TIMES A DAY     Cardiovascular:  Vasodilators Failed - 02/11/2023  8:09 AM      Failed - HCT in normal range and within 360 days    HCT  Date Value Ref Range Status  10/11/2022 28.1 (L) 38.5 - 50.0 % Final         Failed - HGB in normal range and within 360 days    Hemoglobin  Date Value Ref Range Status  10/11/2022 9.2 (L) 13.2 - 17.1 g/dL Final         Failed - RBC in normal range and within 360 days    RBC  Date Value Ref Range Status  10/11/2022 2.95 (L) 4.20 - 5.80 Million/uL Final         Failed - ANA Screen, Ifa, Serum in normal range and within 360 days    No results found for: ANA, ANATITER, LABANTI       Passed - WBC in normal range and within 360 days    WBC  Date Value Ref Range Status  10/11/2022 4.0 3.8 - 10.8 Thousand/uL Final         Passed - PLT in normal range and within 360 days    Platelets  Date Value Ref Range Status  10/11/2022 287 140 - 400 Thousand/uL Final         Passed - Last BP in normal range    BP Readings from Last 1 Encounters:  01/18/23 138/89         Passed - Valid encounter within last 12 months    Recent Outpatient Visits           4 weeks ago Essential hypertension   [facility] Gareth Mliss FALCON, FNP   4 months ago Controlled type 2 diabetes mellitus with microalbuminuria, without long-term current use of insulin  (HCC)   [facility] Mecum, Erin E, PA-C   7 months ago Uncontrolled diabetes mellitus with hyperglycemia, without long-term current use of  insulin  [facility])   [place] Leavy Mole, PA-C   9 months ago Uncontrolled diabetes mellitus with hyperglycemia, without long-term current use of insulin  [facility])   [facility] [facility] Leavy Mole, PA-C   11 months ago Uncontrolled diabetes mellitus with hyperglycemia, without long-term current use of insulin  [facility])   [facility] Leavy Mole, PA-C       Future Appointments             In 2 months Leavy Mole, PA-C [facility]   In 5 months Penne Knee, MD [facility] 

## 2023-03-13 ENCOUNTER — Other Ambulatory Visit: Payer: Self-pay | Admitting: Family Medicine

## 2023-03-13 DIAGNOSIS — I10 Essential (primary) hypertension: Secondary | ICD-10-CM

## 2023-03-31 DIAGNOSIS — N183 Chronic kidney disease, stage 3 unspecified: Secondary | ICD-10-CM | POA: Diagnosis not present

## 2023-03-31 DIAGNOSIS — N3289 Other specified disorders of bladder: Secondary | ICD-10-CM | POA: Diagnosis not present

## 2023-03-31 DIAGNOSIS — E1129 Type 2 diabetes mellitus with other diabetic kidney complication: Secondary | ICD-10-CM | POA: Diagnosis not present

## 2023-03-31 DIAGNOSIS — I129 Hypertensive chronic kidney disease with stage 1 through stage 4 chronic kidney disease, or unspecified chronic kidney disease: Secondary | ICD-10-CM | POA: Diagnosis not present

## 2023-03-31 DIAGNOSIS — E1122 Type 2 diabetes mellitus with diabetic chronic kidney disease: Secondary | ICD-10-CM | POA: Diagnosis not present

## 2023-03-31 DIAGNOSIS — D649 Anemia, unspecified: Secondary | ICD-10-CM | POA: Diagnosis not present

## 2023-04-01 DIAGNOSIS — H43821 Vitreomacular adhesion, right eye: Secondary | ICD-10-CM | POA: Diagnosis not present

## 2023-04-01 DIAGNOSIS — H35033 Hypertensive retinopathy, bilateral: Secondary | ICD-10-CM | POA: Diagnosis not present

## 2023-04-01 DIAGNOSIS — E113312 Type 2 diabetes mellitus with moderate nonproliferative diabetic retinopathy with macular edema, left eye: Secondary | ICD-10-CM | POA: Diagnosis not present

## 2023-04-01 LAB — LAB REPORT - SCANNED: EGFR: 35

## 2023-04-01 LAB — HM DIABETES EYE EXAM

## 2023-04-13 ENCOUNTER — Ambulatory Visit: Payer: Self-pay | Admitting: Family Medicine

## 2023-04-14 ENCOUNTER — Encounter: Payer: Self-pay | Admitting: Family Medicine

## 2023-04-14 ENCOUNTER — Ambulatory Visit (INDEPENDENT_AMBULATORY_CARE_PROVIDER_SITE_OTHER): Admitting: Family Medicine

## 2023-04-14 VITALS — BP 142/78 | HR 71 | Resp 16 | Ht 70.0 in | Wt 181.0 lb

## 2023-04-14 DIAGNOSIS — E1129 Type 2 diabetes mellitus with other diabetic kidney complication: Secondary | ICD-10-CM

## 2023-04-14 DIAGNOSIS — E1122 Type 2 diabetes mellitus with diabetic chronic kidney disease: Secondary | ICD-10-CM

## 2023-04-14 DIAGNOSIS — R809 Proteinuria, unspecified: Secondary | ICD-10-CM

## 2023-04-14 DIAGNOSIS — N183 Chronic kidney disease, stage 3 unspecified: Secondary | ICD-10-CM | POA: Diagnosis not present

## 2023-04-14 DIAGNOSIS — E1169 Type 2 diabetes mellitus with other specified complication: Secondary | ICD-10-CM | POA: Diagnosis not present

## 2023-04-14 DIAGNOSIS — R1013 Epigastric pain: Secondary | ICD-10-CM | POA: Diagnosis not present

## 2023-04-14 DIAGNOSIS — N184 Chronic kidney disease, stage 4 (severe): Secondary | ICD-10-CM | POA: Diagnosis not present

## 2023-04-14 DIAGNOSIS — I10 Essential (primary) hypertension: Secondary | ICD-10-CM

## 2023-04-14 DIAGNOSIS — E785 Hyperlipidemia, unspecified: Secondary | ICD-10-CM

## 2023-04-14 DIAGNOSIS — D631 Anemia in chronic kidney disease: Secondary | ICD-10-CM | POA: Diagnosis not present

## 2023-04-14 DIAGNOSIS — Z7984 Long term (current) use of oral hypoglycemic drugs: Secondary | ICD-10-CM | POA: Diagnosis not present

## 2023-04-14 DIAGNOSIS — E119 Type 2 diabetes mellitus without complications: Secondary | ICD-10-CM

## 2023-04-14 DIAGNOSIS — Z23 Encounter for immunization: Secondary | ICD-10-CM

## 2023-04-14 LAB — POCT GLYCOSYLATED HEMOGLOBIN (HGB A1C): Hemoglobin A1C: 5.9 % — AB (ref 4.0–5.6)

## 2023-04-14 MED ORDER — FARXIGA 10 MG PO TABS
10.0000 mg | ORAL_TABLET | Freq: Every day | ORAL | 1 refills | Status: DC
Start: 2023-04-14 — End: 2023-11-07

## 2023-04-14 MED ORDER — ZOSTER VAC RECOMB ADJUVANTED 50 MCG/0.5ML IM SUSR
0.5000 mL | Freq: Once | INTRAMUSCULAR | 1 refills | Status: AC
Start: 2023-04-14 — End: 2023-04-14

## 2023-04-14 MED ORDER — ATORVASTATIN CALCIUM 40 MG PO TABS
40.0000 mg | ORAL_TABLET | Freq: Every day | ORAL | 3 refills | Status: AC
Start: 2023-04-14 — End: ?

## 2023-04-14 MED ORDER — PANTOPRAZOLE SODIUM 40 MG PO TBEC: 40.0000 mg | DELAYED_RELEASE_TABLET | Freq: Every day | ORAL | 3 refills | Status: DC

## 2023-04-14 NOTE — Assessment & Plan Note (Signed)
 Last A1c at goal DM foot exam done today He cannot afford farxiga - will consult pharmacist POC A1c toay was at goal, though morning sugars were a little higher ~ 140's Limited metformin dose due to renal function Could possible add GLP-1 if needed

## 2023-04-14 NOTE — Assessment & Plan Note (Signed)
 Per nephrology Records will be reviewed when recieved

## 2023-04-14 NOTE — Patient Instructions (Addendum)
 You should get a call from the pharmacist and community care team to help follow up about farxiga or possibly changing to jardiance if cost is an issue.    Lab Results  Component Value Date   HGBA1C 6.6 (A) 01/13/2023   HGBA1C 7.1 (H) 10/11/2022   HGBA1C 6.9 (H) 06/21/2022   HGBA1C 7.8 03/12/2022   HGBA1C 8.3 (H) 03/04/2022   Lab Results  Component Value Date   EGFR 35 (L) 10/11/2022   EGFR 30.0 08/26/2022   EGFR 27 05/21/2022   EGFR 30 (L) 03/04/2022   EGFR 34 (L) 08/17/2021

## 2023-04-14 NOTE — Assessment & Plan Note (Signed)
 Well controlled and stable on current statin No change to lipitor/dose

## 2023-04-14 NOTE — Progress Notes (Signed)
 Name: Cameron Glover   MRN: [id number]    DOB: 04-Sep-1950   Date:04/14/2023       Progress Note  Chief Complaint  Patient presents with   Medical Management of Chronic Issues     Subjective:   Burle Kwan is a 73 y.o. male, presents to clinic for routine follow up on chronic conditions  DM:   Pt managing DM with farxiga but out of meds due to cost for the past couple months, on metformin 500 mg limited dose per renal function/nephrologist Blood sugars at home 140's this am  Denies: Polyuria, polydipsia, vision changes, neuropathy, hypoglycemia Recent pertinent labs: Lab Results  Component Value Date   HGBA1C 6.6 (A) 01/13/2023   HGBA1C 7.1 (H) 10/11/2022   HGBA1C 6.9 (H) 06/21/2022   Lab Results  Component Value Date   MICROALBUR 41.7 06/21/2022   LDLCALC 41 10/11/2022   CREATININE 2.04 (H) 10/11/2022   Standard of care and health maintenance: Urine Microalbumin:  done Foot exam:  done today  DM eye exam:  done ACEI/ARB:  yes Statin:  yes  HTN increased hydralazine 50 mg TID, amlodipine, lisinopril hydrochlorothiazide - some meds managed by nephrology Labs and notes    Hyperlipidemia: Currently treated with lipitor 40, pt reports good med compliance Last Lipids: Lab Results  Component Value Date   CHOL 98 10/11/2022   HDL 35 (L) 10/11/2022   LDLCALC 41 10/11/2022   TRIG 137 10/11/2022   CHOLHDL 2.8 10/11/2022   - Denies: Chest pain, shortness of breath, myalgias, claudication     Current Outpatient Medications:    amLODipine (NORVASC) 10 MG tablet, TAKE 1 TABLET BY MOUTH EVERY DAY, Disp: 90 tablet, Rfl: 1   atorvastatin (LIPITOR) 40 MG tablet, TAKE 1 TABLET BY MOUTH EVERY DAY, Disp: 90 tablet, Rfl: 3   FARXIGA 10 MG TABS tablet, Take 1 tablet (10 mg total) by mouth daily., Disp: 90 tablet, Rfl: 1   hydrALAZINE (APRESOLINE) 25 MG tablet, TAKE 1 TABLET BY MOUTH THREE TIMES A DAY, Disp: 270 tablet, Rfl: 0   lisinopril-hydrochlorothiazide (ZESTORETIC)  20-12.5 MG tablet, Take 2 tablets by mouth daily., Disp: 180 tablet, Rfl: 1   metFORMIN (GLUCOPHAGE) 500 MG tablet, TAKE 1 TABLET BY MOUTH EVERY DAY WITH BREAKFAST, Disp: 90 tablet, Rfl: 0   Oyster Shell Calcium 500 MG TABS, Take 1 tablet by mouth 2 (two) times daily., Disp: , Rfl:    pantoprazole (PROTONIX) 40 MG tablet, TAKE 1 TABLET BY MOUTH EVERY DAY, Disp: 90 tablet, Rfl: 3  Patient Active Problem List   Diagnosis Date Noted   Prostate cancer (HCC) 01/13/2023   Stage 3b chronic kidney disease (HCC) 03/16/2022   Class 1 obesity with serious comorbidity and body mass index (BMI) of 30.0 to 30.9 in adult 03/16/2022   History of prostate cancer 08/17/2021   Grade 3 hypertensive retinopathy, bilateral 04/29/2021   Uncontrolled diabetes mellitus with hyperglycemia, without long-term current use of insulin (HCC) 03/30/2021   Snores 11/17/2020   Pseudophakia of left eye 10/13/2020   Anemia 08/11/2020   Gastroesophageal reflux disease 08/11/2020   Vitamin B12 deficiency 08/11/2020   Vitamin D deficiency 08/11/2020   Elevated parathyroid hormone 08/11/2020   Secondary hyperparathyroidism (HCC) 08/11/2020   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   Severe nonproliferative diabetic retinopathy of left eye, with macular edema, associated with type 2 diabetes mellitus (HCC) 05/14/2019   Severe nonproliferative diabetic retinopathy of right eye, with macular edema, associated with type 2 diabetes mellitus (HCC)  05/14/2019   Retinal hemorrhage of right eye 05/14/2019   Retinal hemorrhage of left eye 05/14/2019   Nuclear sclerotic cataract of right eye 05/14/2019   LVH (left ventricular hypertrophy) 03/23/2019   Hyperlipidemia associated with type 2 diabetes mellitus (HCC) 09/22/2018   CKD stage 3 due to type 2 diabetes mellitus (HCC) 09/22/2018   Essential hypertension 04/15/2018   Anemia due to stage 4 chronic kidney disease (HCC) 04/15/2018   Controlled type 2 diabetes mellitus with  microalbuminuria, without long-term current use of insulin (HCC) 09/02/2017    Past Surgical History:  Procedure Laterality Date   COLONOSCOPY WITH PROPOFOL N/A 01/15/2019   Procedure: COLONOSCOPY WITH PROPOFOL;  Surgeon: Wyline Mood, MD;  [facility];  Service: Gastroenterology;  Laterality: N/A;    Family History  Problem Relation Age of Onset   Kidney failure Sister    Cancer Sister    Diabetes Sister    Hypertension Sister    Hyperlipidemia Sister    Heart disease Sister    Kidney disease Sister    Cancer Brother    CAD Brother    Diabetes Brother    Hypertension Brother    Hyperlipidemia Brother    Heart disease Brother    Diabetes Other    Cancer Mother     Social History   Tobacco Use   Smoking status: Never    Passive exposure: Never   Smokeless tobacco: Never  Vaping Use   Vaping status: Never Used  Substance Use Topics   Alcohol use: Never   Drug use: Never     No Known Allergies  Health Maintenance  Topic Date Due   COVID-19 Vaccine (4 - 2024-25 season) 09/05/2022   FOOT EXAM  03/12/2023   Zoster Vaccines- Shingrix (1 of 2) 07/14/2023 (Originally 11/14/1969)   DTaP/Tdap/Td (1 - Tdap) 10/11/2023 (Originally 11/14/1969)   OPHTHALMOLOGY EXAM  05/27/2023   Medicare Annual Wellness (AWV)  06/17/2023   Diabetic kidney evaluation - Urine ACR  06/21/2023   HEMOGLOBIN A1C  07/13/2023   INFLUENZA VACCINE  08/05/2023   Diabetic kidney evaluation - eGFR measurement  10/11/2023   Colonoscopy  01/15/2024   Pneumonia Vaccine 27+ Years old  Completed   Hepatitis C Screening  Completed   HPV VACCINES  Aged Out   Meningococcal B Vaccine  Aged Out    Chart Review Today: I personally reviewed active problem list, medication list, allergies, family history, social history, health maintenance, notes from last encounter, lab results, imaging with the patient/caregiver today.    Review of Systems  Constitutional: Negative.   HENT: Negative.     Eyes: Negative.   Respiratory: Negative.    Cardiovascular: Negative.   Gastrointestinal: Negative.   Endocrine: Negative.   Genitourinary: Negative.   Musculoskeletal: Negative.   Skin: Negative.   Allergic/Immunologic: Negative.   Neurological: Negative.   Hematological: Negative.   Psychiatric/Behavioral: Negative.    All other systems reviewed and are negative.    Objective:   Vitals:   04/14/23 1047  BP: (!) 142/78  Pulse: 71  Resp: 16  SpO2: 99%  Weight: 181 lb (82.1 kg)  Height: 5\' 10"  (1.778 m)    Body mass index is 25.97 kg/m.  Physical Exam Vitals and nursing note reviewed.  Constitutional:      Appearance: Normal appearance. He is well-developed.  HENT:     Head: Normocephalic and atraumatic.     Nose: Nose normal.  Eyes:     General:  Right eye: No discharge.        Left eye: No discharge.     Conjunctiva/sclera: Conjunctivae normal.  Neck:     Trachea: No tracheal deviation.  Cardiovascular:     Rate and Rhythm: Normal rate and regular rhythm.  Pulmonary:     Effort: Pulmonary effort is normal. No respiratory distress.     Breath sounds: No stridor.  Musculoskeletal:     Right lower leg: No edema.     Left lower leg: No edema.  Skin:    General: Skin is warm and dry.     Findings: No rash.  Neurological:     Mental Status: He is alert.     Motor: No abnormal muscle tone.     Coordination: Coordination normal.  Psychiatric:        Behavior: Behavior normal.     Diabetic Foot Exam - Simple   Simple Foot Form Diabetic Foot exam was performed with the following findings: Yes 04/14/2023 11:12 AM  Visual Inspection No deformities, no ulcerations, no other skin breakdown bilaterally: Yes Sensation Testing Intact to touch and monofilament testing bilaterally: Yes Pulse Check Posterior Tibialis and Dorsalis pulse intact bilaterally: Yes Comments Some nails longer/thick/broken     Last labs reviewed   Results for orders placed  or performed in visit on 01/17/23  PSA   Collection Time: 01/17/23  1:24 PM  Result Value Ref Range   Prostate Specific Ag, Serum <0.1 0.0 - 4.0 ng/mL      Assessment & Plan:   Essential hypertension Assessment & Plan: BP still a little above goal Nephrology changed med doses, hydralazine increased to 50 mg TID Still on norvasc, lisinopril-HCTZ  Will review nephro records and labs when received, he did f/up OV and labs about a week ago BP Readings from Last 3 Encounters:  04/14/23 (!) 142/78  01/18/23 138/89  01/13/23 (!) 140/80      Controlled type 2 diabetes mellitus with microalbuminuria, without long-term current use of insulin (HCC) Assessment & Plan: Last A1c at goal DM foot exam done today He cannot afford farxiga - will consult pharmacist POC A1c toay was at goal, though morning sugars were a little higher ~ 140's Limited metformin dose due to renal function Could possible add GLP-1 if needed  Orders: -     Farxiga; Take 1 tablet (10 mg total) by mouth daily.  Dispense: 90 tablet; Refill: 1 -     HM Diabetes Foot Exam -     POCT glycosylated hemoglobin (Hb A1C)  CKD stage 3 due to type 2 diabetes mellitus [facility]) Assessment & Plan: Per nephrology Records will be reviewed when recieved  Orders: -     Farxiga; Take 1 tablet (10 mg total) by mouth daily.  Dispense: 90 tablet; Refill: 1 -     AMB Referral VBCI Care Management  Epigastric pain -     Pantoprazole Sodium; Take 1 tablet (40 mg total) by mouth daily.  Dispense: 90 tablet; Refill: 3  Anemia due to stage 4 chronic kidney disease (HCC) Assessment & Plan: Stable - managed by nephro  Orders: -     AMB Referral VBCI Care Management  Hyperlipidemia associated with type 2 diabetes mellitus (HCC) Assessment & Plan: Well controlled and stable on current statin No change to lipitor/dose  Orders: -     AMB Referral VBCI Care Management  Controlled type 2 diabetes mellitus without complication,  without long-term current use of insulin (HCC) -  Atorvastatin Calcium; Take 1 tablet (40 mg total) by mouth daily.  Dispense: 90 tablet; Refill: 3 -     AMB Referral VBCI Care Management  Need for shingles vaccine -     Zoster Vac Recomb Adjuvanted; Inject 0.5 mLs into the muscle once for 1 dose. And repeat once more in 2-6 months  Dispense: 0.5 mL; Refill: 1     Return in about 3 months (around 07/14/2023) for DM, CKD med changes due to cost.   Danelle Berry, PA-C 04/14/23 11:00 AM

## 2023-04-14 NOTE — Assessment & Plan Note (Signed)
 BP still a little above goal Nephrology changed med doses, hydralazine increased to 50 mg TID Still on norvasc, lisinopril-HCTZ  Will review nephro records and labs when received, he did f/up OV and labs about a week ago BP Readings from Last 3 Encounters:  04/14/23 (!) 142/78  01/18/23 138/89  01/13/23 (!) 140/80

## 2023-04-14 NOTE — Assessment & Plan Note (Signed)
 Stable - managed by nephro

## 2023-04-18 ENCOUNTER — Other Ambulatory Visit: Payer: Self-pay | Admitting: Pharmacist

## 2023-04-18 DIAGNOSIS — I10 Essential (primary) hypertension: Secondary | ICD-10-CM

## 2023-04-18 DIAGNOSIS — R809 Proteinuria, unspecified: Secondary | ICD-10-CM

## 2023-04-18 DIAGNOSIS — N1832 Chronic kidney disease, stage 3b: Secondary | ICD-10-CM

## 2023-04-18 NOTE — Patient Instructions (Signed)
 Goals Addressed             This Visit's Progress    Pharmacy Goals       Please watch the mail for an envelope from [facility] containing the patient assistance program application. Please complete this application and bring to office to have it faxed back to Attention: Floyd Hutchinson at Fax # [phone] along with a copy of your Medicare Part D prescription card and a copy of your proof of income document.  If you need to call Lindaann Requena, you can reach her at [phone].  If you need to reach out to patient assistance programs regarding refills or to find out the status of your application, you can do so by calling:  [employer] at [phone]  Arthur Lash, PharmD, [facility] [phone]

## 2023-04-18 NOTE — Progress Notes (Signed)
 04/18/2023 Name: Jashun Puertas MRN: [id number] DOB: [birth year]  Chief Complaint  Patient presents with   Medication Assistance    Nafis Farnan is a 73 y.o. year old male who presented for a telephone visit.   They were referred to the pharmacist by their PCP for assistance in managing medication access.    Subjective:  Care Team: Primary Care Provider: Danelle Berry, Cordelia Poche ; Next Scheduled Visit: 07/14/2023 Urologist: Vanna Scotland, MD; Next Scheduled Visit: 07/12/2023 Nephrologist: Tyler Pita, MD   Medication Access/Adherence  Current Pharmacy:  [phone] [place] [place] - [address] AT [address] [place] [address] Phone: [phone] Fax: [phone]   Patient reports affordability concerns with their medications: Yes  Patient reports access/transportation concerns to their pharmacy: No  Patient reports adherence concerns with their medications:  No    Reports has been unable to afford cost of his Marcelline Deist since beginning of calendar year due to cost through his [employer] plan  Hypertension:  Current medications:  - amlodipine 10 mg daily - hydralazine 25 mg - 2 tablets (50 mg) three times daily  Reports increased to current dose by Nephrologist ~2 weeks ago - lisinopril-HCTZ 20-12.5 mg - 2 tablets daily  Patient does not have an automated, upper arm home BP cuff; currently unable to check home BP  Patient denies hypotensive s/sx including dizziness, lightheadedness.   Diabetes:  Current medications:  - metformin 500 mg daily - Farxiga 10 mg daily - note patient not currently taking due to cost  Reports monitors blood sugar ~twice weekly at home with recent readings ranging 120-140s  Statin therapy: atorvastatin 40 mg daily  Current medication access support: none   Objective:  Lab Results  Component Value Date   HGBA1C 5.9 (A) 04/14/2023    Lab Results   Component Value Date   CREATININE 2.04 (H) 10/11/2022   BUN 48 (H) 10/11/2022   NA 137 10/11/2022   K 4.7 10/11/2022   CL 105 10/11/2022   CO2 21 10/11/2022    Lab Results  Component Value Date   CHOL 98 10/11/2022   HDL 35 (L) 10/11/2022   LDLCALC 41 10/11/2022   TRIG 137 10/11/2022   CHOLHDL 2.8 10/11/2022   BP Readings from Last 3 Encounters:  04/14/23 (!) 142/78  01/18/23 138/89  01/13/23 (!) 140/80   Pulse Readings from Last 3 Encounters:  04/14/23 71  01/18/23 (!) 103  01/13/23 (!) 103    Medications Reviewed Today     Reviewed by Manuela Neptune, RPH-CPP (Pharmacist) on 04/18/23 at [phone]  Med List Status: <None>   Medication Order Taking? Sig Documenting Provider Last Dose Status Informant  amLODipine (NORVASC) 10 MG tablet [id number] Yes TAKE 1 TABLET BY MOUTH EVERY DAY Danelle Berry, PA-C Taking Active   atorvastatin (LIPITOR) 40 MG tablet [id number] Yes Take 1 tablet (40 mg total) by mouth daily. Danelle Berry, PA-C Taking Active   FARXIGA 10 MG TABS tablet [id number]  Take 1 tablet (10 mg total) by mouth daily. Danelle Berry, PA-C  Active   hydrALAZINE (APRESOLINE) 25 MG tablet [id number] Yes TAKE 1 TABLET BY MOUTH THREE TIMES A DAY  Patient taking differently: Take 50 mg by mouth 3 (three) times daily.   Danelle Berry, PA-C Taking Active   lisinopril-hydrochlorothiazide (ZESTORETIC) 20-12.5 MG tablet [id number] Yes Take 2 tablets by mouth daily. Danelle Berry, PA-C Taking Active   metFORMIN (GLUCOPHAGE) 500 MG tablet [id number] Yes  TAKE 1 TABLET BY MOUTH EVERY DAY WITH BREAKFAST Tapia, Leisa, PA-C Taking Active   Oyster Shell Calcium 500 MG TABS [id number]  Take 1 tablet by mouth 2 (two) times daily. [provider]  Active   pantoprazole (PROTONIX) 40 MG tablet [id number] Yes Take 1 tablet (40 mg total) by mouth daily. Adeline Hone, PA-C Taking Active               Assessment/Plan:   Comprehensive medication review performed; medication list updated  in electronic medical record  Based on reported income, patient does not meet criteria for Extra Help subsidy through Social Security  Meets financial criteria for [place] patient assistance program through [employer]. Will collaborate with provider, CPhT, and patient to pursue assistance.   Outreach to [facility] on behalf of patient today to provide manufacturer 30 day trial card for patient to receive free 1 month supply of Farxiga from pharmacy   Hypertension: - Reviewed long term cardiovascular and renal outcomes of uncontrolled blood pressure - Advise patient to follow up with his insurance plan to obtain an upper arm blood pressure monitor through his over the counter benefit - Recommend to monitor home blood pressure, keep log of results and have this record to review at upcoming medical appointments. Patient to contact provider office sooner if needed for readings outside of established parameters or symptoms   Diabetes: - Currently controlled - Reviewed long term cardiovascular and renal outcomes of uncontrolled blood sugar - Recommend to check glucose, keep log of results and have this record to review at upcoming medical appointments. Patient to contact provider office sooner if needed for readings outside of established parameters or symptoms    Follow Up Plan: Clinical Pharmacist will follow up with patient by telephone again next month  Arthur Lash, PharmD, [facility] [phone]

## 2023-04-21 ENCOUNTER — Encounter: Payer: Self-pay | Admitting: Urology

## 2023-04-27 ENCOUNTER — Telehealth: Payer: Self-pay

## 2023-04-27 NOTE — Telephone Encounter (Signed)
 Following up on PAP [employer] (Farxiga ) gave pt a call to see if he has received application, spoke with pt explain he received application had question on income and household size pt was able to get answer and will mail application back sometime this week, will follow up in a few days.

## 2023-04-29 ENCOUNTER — Ambulatory Visit (HOSPITAL_COMMUNITY)
Admission: RE | Admit: 2023-04-29 | Discharge: 2023-04-29 | Disposition: A | Discharge status: Home / Self Care | Source: Ambulatory Visit | Attending: Internal Medicine | Admitting: Internal Medicine

## 2023-04-29 VITALS — BP 163/84 | HR 103 | Temp 96.5°F | Resp 16

## 2023-04-29 DIAGNOSIS — N1832 Chronic kidney disease, stage 3b: Secondary | ICD-10-CM | POA: Insufficient documentation

## 2023-04-29 DIAGNOSIS — N184 Chronic kidney disease, stage 4 (severe): Secondary | ICD-10-CM | POA: Insufficient documentation

## 2023-04-29 DIAGNOSIS — D631 Anemia in chronic kidney disease: Secondary | ICD-10-CM | POA: Diagnosis not present

## 2023-04-29 LAB — POCT HEMOGLOBIN-HEMACUE: Hemoglobin: 8.2 g/dL — ABNORMAL LOW (ref 13.0–17.0)

## 2023-04-29 MED ORDER — EPOETIN ALFA 20000 UNIT/ML IJ SOLN
20000.0000 [IU] | INTRAMUSCULAR | Status: DC
  Administered 2023-04-29: 20000 [IU] via SUBCUTANEOUS

## 2023-04-29 MED ORDER — EPOETIN ALFA 20000 UNIT/ML IJ SOLN
INTRAMUSCULAR | Status: AC
  Filled 2023-04-29: qty 1

## 2023-04-29 NOTE — Telephone Encounter (Signed)
 Received pt portion application on [employer], waiting on provider portion to be fax to company.

## 2023-05-03 NOTE — Telephone Encounter (Signed)
 Received provider portion of PAP today, will be fax application along pt portion to [employer] today and will follow up in a few days

## 2023-05-08 ENCOUNTER — Other Ambulatory Visit: Payer: Self-pay | Admitting: Family Medicine

## 2023-05-08 DIAGNOSIS — E1129 Type 2 diabetes mellitus with other diabetic kidney complication: Secondary | ICD-10-CM

## 2023-05-10 NOTE — Telephone Encounter (Signed)
 Requested Prescriptions  Pending Prescriptions Disp Refills   metFORMIN  (GLUCOPHAGE ) 500 MG tablet [Pharmacy Med Name: METFORMIN  HCL 500 MG TABLET] 90 tablet 1    Sig: TAKE 1 TABLET BY MOUTH EVERY DAY WITH BREAKFAST     Endocrinology:  Diabetes - Biguanides Failed - 05/10/2023 12:08 PM      Failed - Cr in normal range and within 360 days    Creat  Date Value Ref Range Status  10/11/2022 2.04 (H) 0.70 - 1.35 mg/dL Final   Creatinine, Urine  Date Value Ref Range Status  06/21/2022 68 20 - 320 mg/dL Final         Failed - eGFR in normal range and within 360 days    GFR, Est African American  Date Value Ref Range Status  02/11/2020 58 (L) > OR = 60 mL/min/1.8m2 Final   GFR, Est Non African American  Date Value Ref Range Status  02/11/2020 50 (L) > OR = 60 mL/min/1.31m2 Final   eGFR  Date Value Ref Range Status  10/11/2022 35 (L) > OR = 60 mL/min/1.37m2 Final         Failed - B12 Level in normal range and within 720 days    Vitamin B-12  Date Value Ref Range Status  08/11/2020 250 200 - 1,100 pg/mL Final    Comment:    . Please Note: Although the reference range for vitamin B12 is [phone] pg/mL, it has been reported that between 5 and 10% of patients with values between 200 and 400 pg/mL may experience neuropsychiatric and hematologic abnormalities due to occult B12 deficiency; less than 1% of patients with values above 400 pg/mL will have symptoms. .          Passed - HBA1C is between 0 and 7.9 and within 180 days    Hemoglobin A1C  Date Value Ref Range Status  04/14/2023 5.9 (A) 4.0 - 5.6 % Final  03/12/2022 7.8  Final    Comment:    House call   Hgb A1c MFr Bld  Date Value Ref Range Status  10/11/2022 7.1 (H) <5.7 % of total Hgb Final    Comment:    For someone without known diabetes, a hemoglobin A1c value of 6.5% or greater indicates that they may have  diabetes and this should be confirmed with a follow-up  test. . For someone with known diabetes, a  value <7% indicates  that their diabetes is well controlled and a value  greater than or equal to 7% indicates suboptimal  control. A1c targets should be individualized based on  duration of diabetes, age, comorbid conditions, and  other considerations. . Currently, no consensus exists regarding use of hemoglobin A1c for diagnosis of diabetes for children. Temple Feeler - Valid encounter within last 6 months    Recent Outpatient Visits           3 weeks ago Essential hypertension   [facility] Adeline Hone, PA-C       Future Appointments             In 2 months McGowan, Nyra Bellis [facility] [place]            Passed - CBC within normal limits and completed in the last 12 months    WBC  Date Value Ref Range Status  10/11/2022 4.0 3.8 - 10.8 Thousand/uL Final   RBC  Date Value Ref Range Status  10/11/2022 2.95 (L) 4.20 - 5.80 Million/uL Final   Hemoglobin  Date Value Ref Range Status  04/29/2023 8.2 (L) 13.0 - 17.0 g/dL Final   HCT  Date Value Ref Range Status  10/11/2022 28.1 (L) 38.5 - 50.0 % Final   MCHC  Date Value Ref Range Status  10/11/2022 32.7 32.0 - 36.0 g/dL Final    Comment:    For adults, a slight decrease in the calculated MCHC value (in the range of 30 to 32 g/dL) is most likely not clinically significant; however, it should be interpreted with caution in correlation with other red cell parameters and the patient's clinical condition.    [facility]  Date Value Ref Range Status  10/11/2022 31.2 27.0 - 33.0 pg Final   MCV  Date Value Ref Range Status  10/11/2022 95.3 80.0 - 100.0 fL Final   No results found for: "PLTCOUNTKUC", "LABPLAT", "POCPLA" RDW  Date Value Ref Range Status  10/11/2022 12.3 11.0 - 15.0 % Final

## 2023-05-17 NOTE — Telephone Encounter (Signed)
 Following up on pt's pap status [employer] (farxiga ) spoke with representative explain pt has been APPROVED FOR 2025 and have mail out a refill to pt address,will be receiving it on Wednesday 05/18/2023 pt is aware and will be on the look for the mail, pt ask if he does not received ,ask pt to call back and we can follow up with company.

## 2023-05-18 ENCOUNTER — Other Ambulatory Visit: Payer: Self-pay | Admitting: Pharmacist

## 2023-05-18 DIAGNOSIS — I10 Essential (primary) hypertension: Secondary | ICD-10-CM

## 2023-05-18 DIAGNOSIS — N1832 Chronic kidney disease, stage 3b: Secondary | ICD-10-CM

## 2023-05-18 DIAGNOSIS — E1129 Type 2 diabetes mellitus with other diabetic kidney complication: Secondary | ICD-10-CM

## 2023-05-18 NOTE — Patient Instructions (Signed)
 Goals Addressed             This Visit's Progress    Pharmacy Goals       If you need to reach out to patient assistance programs regarding refills or to find out the status of your application, you can do so by calling:  [employer] at [phone]  Check your blood pressure twice weekly, and any time you have concerning symptoms like headache, chest pain, dizziness, shortness of breath, or vision changes.   Our goal is less than 130/80.  To appropriately check your blood pressure, make sure you do the following:  1) Avoid caffeine, exercise, or tobacco products for 30 minutes before checking. Empty your bladder. 2) Sit with your back supported in a flat-backed chair. Rest your arm on something flat (arm of the chair, table, etc). 3) Sit still with your feet flat on the floor, resting, for at least 5 minutes.  4) Check your blood pressure. Take 1-2 readings.  5) Write down these readings and bring with you to any provider appointments.  Bring your home blood pressure machine with you to a provider's office for accuracy comparison at least once a year.   Make sure you take your blood pressure medications before you come to any office visit, even if you were asked to fast for labs.   Arthur Lash, PharmD, [facility] [phone]

## 2023-05-18 NOTE — Progress Notes (Signed)
 05/18/2023 Name: Cameron Glover MRN: [id number] DOB: [birth year]  Chief Complaint  Patient presents with   Medication Management   Medication Assistance    Cameron Glover is a 73 y.o. year old male who presented for a telephone visit.   They were referred to the pharmacist by their PCP for assistance in managing medication access.    Conversation limited today as patient reports that he is not currently home   Subjective:   Care Team: Primary Care Provider: Adeline Hone, PA-C ; Next Scheduled Visit: 07/14/2023 Urologist: Cleta Daisy; Next Scheduled Visit: 07/12/2023 Nephrologist: Baron Border, MD   Medication Access/Adherence  Current Pharmacy:  [phone] [place], [place] - [address] AT [facility] OF [address] [place] [address] Phone: [phone] Fax: [phone]   Patient reports affordability concerns with their medications: No Patient reports access/transportation concerns to their pharmacy: No  Patient reports adherence concerns with their medications:  No       Diabetes:   Current medications:  - metformin  500 mg daily - Farxiga  10 mg daily - restarted yesterday when received from assistance program   Reports monitors blood sugar ~twice weekly at home, but unable to review readings today as is not currently home   Statin therapy: atorvastatin  40 mg daily   Current medication access support: enrolled in patient assistance for Farxiga  from [employer] through 01/04/2024  Reports received a 90 day supply in the mail from program yesterday  Hypertension:   Current medications:  - amlodipine  10 mg daily - hydralazine  25 mg - 2 tablets (50 mg) three times daily - lisinopril -HCTZ 20-12.5 mg - 2 tablets daily   Patient does not have an automated, upper arm home BP cuff; currently unable to check home BP, but plans to obtain home monitor through OTC benefit    Objective:  Lab Results   Component Value Date   HGBA1C 5.9 (A) 04/14/2023    Lab Results  Component Value Date   CREATININE 2.04 (H) 10/11/2022   BUN 48 (H) 10/11/2022   NA 137 10/11/2022   K 4.7 10/11/2022   CL 105 10/11/2022   CO2 21 10/11/2022    Lab Results  Component Value Date   CHOL 98 10/11/2022   HDL 35 (L) 10/11/2022   LDLCALC 41 10/11/2022   TRIG 137 10/11/2022   CHOLHDL 2.8 10/11/2022   BP Readings from Last 3 Encounters:  04/29/23 (!) 163/84  04/14/23 (!) 142/78  01/18/23 138/89   Pulse Readings from Last 3 Encounters:  04/29/23 (!) 103  04/14/23 71  01/18/23 (!) 103     Current Outpatient Medications on File Prior to Visit  Medication Sig Dispense Refill   amLODipine  (NORVASC ) 10 MG tablet TAKE 1 TABLET BY MOUTH EVERY DAY 90 tablet 1   atorvastatin  (LIPITOR) 40 MG tablet Take 1 tablet (40 mg total) by mouth daily. 90 tablet 3   FARXIGA  10 MG TABS tablet Take 1 tablet (10 mg total) by mouth daily. 90 tablet 1   hydrALAZINE  (APRESOLINE ) 25 MG tablet TAKE 1 TABLET BY MOUTH THREE TIMES A DAY (Patient taking differently: Take 50 mg by mouth 3 (three) times daily.) 270 tablet 0   lisinopril -hydrochlorothiazide  (ZESTORETIC ) 20-12.5 MG tablet Take 2 tablets by mouth daily. 180 tablet 1   metFORMIN  (GLUCOPHAGE ) 500 MG tablet TAKE 1 TABLET BY MOUTH EVERY DAY WITH BREAKFAST 90 tablet 1   Oyster Shell Calcium  500 MG TABS Take 1 tablet by mouth  2 (two) times daily.     pantoprazole  (PROTONIX ) 40 MG tablet Take 1 tablet (40 mg total) by mouth daily. 90 tablet 3   No current facility-administered medications on file prior to visit.       Assessment/Plan:   Diabetes: - Currently controlled - Reviewed long term cardiovascular and renal outcomes of uncontrolled blood sugar - Recommend to check glucose, keep log of results and have this record to review at upcoming medical appointments. Patient to contact provider office sooner if needed for readings outside of established parameters or  symptoms  - Patient to follow up with [employer] patient assistance program as needed for refills of Farxiga   Hypertension: - Have reviewed long term cardiovascular and renal outcomes of uncontrolled blood pressure - Advise patient to follow up with his insurance plan to obtain an upper arm blood pressure monitor through his over the counter benefit - Review home blood pressure monitoring technique - Recommend to monitor home blood pressure, keep log of results and have this record to review at upcoming medical appointments. Patient to contact provider office sooner if needed for readings outside of established parameters or symptoms  Follow Up Plan: Clinical Pharmacist will follow up with patient by telephone on 06/08/2023 at 10:00 AM to review home blood pressure monitoring results   Arthur Lash, PharmD, [facility] [phone]

## 2023-05-27 ENCOUNTER — Ambulatory Visit (HOSPITAL_COMMUNITY)
Admission: RE | Admit: 2023-05-27 | Discharge: 2023-05-27 | Disposition: A | Discharge status: Home / Self Care | Source: Ambulatory Visit | Attending: Internal Medicine | Admitting: Internal Medicine

## 2023-05-27 VITALS — BP 172/92 | HR 108 | Temp 97.8°F | Resp 17

## 2023-05-27 DIAGNOSIS — N1832 Chronic kidney disease, stage 3b: Secondary | ICD-10-CM | POA: Diagnosis not present

## 2023-05-27 DIAGNOSIS — D631 Anemia in chronic kidney disease: Secondary | ICD-10-CM | POA: Diagnosis not present

## 2023-05-27 DIAGNOSIS — N184 Chronic kidney disease, stage 4 (severe): Secondary | ICD-10-CM | POA: Insufficient documentation

## 2023-05-27 LAB — FERRITIN: Ferritin: 77 ng/mL (ref 24–336)

## 2023-05-27 LAB — IRON AND TIBC
Iron: 75 ug/dL (ref 45–182)
Saturation Ratios: 23 % (ref 17.9–39.5)
TIBC: 322 ug/dL (ref 250–450)
UIBC: 247 ug/dL

## 2023-05-27 LAB — POCT HEMOGLOBIN-HEMACUE: Hemoglobin: 9.3 g/dL — ABNORMAL LOW (ref 13.0–17.0)

## 2023-05-27 MED ORDER — EPOETIN ALFA-EPBX 10000 UNIT/ML IJ SOLN
20000.0000 [IU] | INTRAMUSCULAR | Status: DC
  Administered 2023-05-27: 20000 [IU] via SUBCUTANEOUS

## 2023-05-27 MED ORDER — EPOETIN ALFA-EPBX 10000 UNIT/ML IJ SOLN
INTRAMUSCULAR | Status: AC
  Filled 2023-05-27: qty 2

## 2023-06-08 ENCOUNTER — Other Ambulatory Visit: Payer: Self-pay | Admitting: Pharmacist

## 2023-06-08 DIAGNOSIS — I10 Essential (primary) hypertension: Secondary | ICD-10-CM

## 2023-06-08 NOTE — Progress Notes (Signed)
 06/08/2023 Name: Cameron Glover MRN: [id number] DOB: 1950/07/17  Chief Complaint  Patient presents with   Medication Assistance   Medication Management    Cameron Glover is a 73 y.o. year old male who presented for a telephone visit.   They were referred to the pharmacist by their PCP for assistance in managing medication access.     Subjective:   Care Team: Primary Care Provider: Adeline Hone, PA-C ; Next Scheduled Visit: 07/14/2023 Urologist: Cleta Daisy; Next Scheduled Visit: 07/12/2023 Nephrologist: Baron Border, MD   Medication Access/Adherence  [phone] [place], [place] - [address] AT [address] [place] [address] Phone: [phone] Fax: [phone]   Patient reports affordability concerns with their medications: No Patient reports access/transportation concerns to their pharmacy: No  Patient reports adherence concerns with their medications:  No    Uses weekly pillbox, but reports takes hydralazine  directly from pill bottle   Hypertension:   Current medications:  - amlodipine  10 mg daily - hydralazine  50 mg - 1 tablets three times daily - lisinopril -HCTZ 20-12.5 mg - 2 tablets daily   Denies having yet obtained an automated, upper arm home BP cuff; currently unable to check home BP, but plans to obtain home monitor through OTC benefit  Note BP reading from latest clinic appointment on 5/23 was elevated, but denies having had the chance to rest prior to reading (states had just walked in from parking lot)  Admits to sometimes adding salt to his food, but reports has cut back  Objective:  Lab Results  Component Value Date   HGBA1C 5.9 (A) 04/14/2023    Lab Results  Component Value Date   CREATININE 2.04 (H) 10/11/2022   BUN 48 (H) 10/11/2022   NA 137 10/11/2022   K 4.7 10/11/2022   CL 105 10/11/2022   CO2 21 10/11/2022    Lab Results   Component Value Date   CHOL 98 10/11/2022   HDL 35 (L) 10/11/2022   LDLCALC 41 10/11/2022   TRIG 137 10/11/2022   CHOLHDL 2.8 10/11/2022   BP Readings from Last 3 Encounters:  05/27/23 (!) 172/92  04/29/23 (!) 163/84  04/14/23 (!) 142/78   Pulse Readings from Last 3 Encounters:  05/27/23 (!) 108  04/29/23 (!) 103  04/14/23 71     Medications Reviewed Today     Reviewed by Ardis Becton, RPH-CPP (Pharmacist) on 06/08/23 at 1020  Med List Status: <None>   Medication Order Taking? Sig Documenting Provider Last Dose Status Informant  amLODipine  (NORVASC ) 10 MG tablet [id number] Yes TAKE 1 TABLET BY MOUTH EVERY DAY Tapia, Leisa, PA-C Taking Active   atorvastatin  (LIPITOR) 40 MG tablet [id number] Yes Take 1 tablet (40 mg total) by mouth daily. Tapia, Leisa, PA-C Taking Active   famotidine (PEPCID) 20 MG tablet [id number] Yes Take 20 mg by mouth at bedtime. [provider] Taking Active   FARXIGA  10 MG TABS tablet [id number]  Take 1 tablet (10 mg total) by mouth daily. Tapia, Leisa, PA-C  Active   hydrALAZINE  (APRESOLINE ) 25 MG tablet [id number] Yes TAKE 1 TABLET BY MOUTH THREE TIMES A DAY  Patient taking differently: Take 50 mg by mouth 3 (three) times daily.   Tapia, Leisa, PA-C Taking Active   lisinopril -hydrochlorothiazide  (ZESTORETIC ) 20-12.5 MG tablet [id number] Yes Take 2 tablets by mouth daily. Tapia, Leisa, PA-C Taking Active   metFORMIN  (GLUCOPHAGE ) 500 MG tablet [id number] Yes TAKE 1  TABLET BY MOUTH EVERY DAY WITH BREAKFAST Tapia, Leisa, PA-C Taking Active   Oyster Shell Calcium  500 MG TABS [id number]  Take 1 tablet by mouth 2 (two) times daily. [provider]  Active   pantoprazole  (PROTONIX ) 40 MG tablet [id number] Yes Take 1 tablet (40 mg total) by mouth daily. Tapia, Leisa, PA-C Taking Active               Assessment/Plan:   Comprehensive medication review performed; medication list updated in electronic medical record  Discuss importance of  medication adherence. Recommend patient obtain additional pillbox to allow him to also include hydralazine  in pillbox. Discuss may also consider using daily phone alarms to aid with adherence    Hypertension: - Have reviewed long term cardiovascular and renal outcomes of uncontrolled blood pressure - Advise patient to follow up with his insurance plan to obtain an upper arm blood pressure monitor through his over the counter benefit - Review home blood pressure monitoring technique - Encourage patient to limit salt/sodium intake to aid with blood pressure control - Recommend to start to monitor home blood pressure, keep log of results and have this record to review at upcoming medical appointments. Patient to contact provider office sooner if needed for readings outside of established parameters or symptoms   Follow Up Plan: Clinical Pharmacist will follow up with patient by telephone on 06/22/2023 9:00 AM  to review home blood pressure monitoring results   Arthur Lash, PharmD, [facility] [phone]

## 2023-06-08 NOTE — Patient Instructions (Signed)
 Goals Addressed             This Visit's Progress    Pharmacy Goals       If you need to reach out to patient assistance programs regarding refills or to find out the status of your application, you can do so by calling:  [employer] at [phone]  Check your blood pressure twice weekly, and any time you have concerning symptoms like headache, chest pain, dizziness, shortness of breath, or vision changes.   Our goal is less than 130/80.  To appropriately check your blood pressure, make sure you do the following:  1) Avoid caffeine, exercise, or tobacco products for 30 minutes before checking. Empty your bladder. 2) Sit with your back supported in a flat-backed chair. Rest your arm on something flat (arm of the chair, table, etc). 3) Sit still with your feet flat on the floor, resting, for at least 5 minutes.  4) Check your blood pressure. Take 1-2 readings.  5) Write down these readings and bring with you to any provider appointments.  Bring your home blood pressure machine with you to a provider's office for accuracy comparison at least once a year.   Make sure you take your blood pressure medications before you come to any office visit, even if you were asked to fast for labs.   Arthur Lash, PharmD, [facility] [phone]

## 2023-06-22 ENCOUNTER — Other Ambulatory Visit: Payer: Self-pay | Admitting: Pharmacist

## 2023-06-22 ENCOUNTER — Telehealth: Payer: Self-pay | Admitting: Pharmacy Technician

## 2023-06-22 DIAGNOSIS — I10 Essential (primary) hypertension: Secondary | ICD-10-CM

## 2023-06-22 NOTE — Progress Notes (Signed)
 06/22/2023 Name: Cameron Glover MRN: [id number] DOB: 1950-03-22  Chief Complaint  Patient presents with   Medication Management   Medication Adherence    Cameron Glover is a 73 y.o. year old male who presented for a telephone visit.   They were referred to the pharmacist by their PCP for assistance in managing medication access.      Subjective:   Care Team: Primary Care Provider: Adeline Hone, PA-C ; Next Scheduled Visit: 07/14/2023 Urologist: Cleta Daisy; Next Scheduled Visit: 07/12/2023 Nephrologist: Baron Border, MD   Medication Access/Adherence  [phone] [place], [place] - [address] AT [facility] [address] [place] [address] Phone: [phone] Fax: [phone]   Uses weekly pillbox. Reports obtained an additional pillbox to be able to include his hydralazine  doses, but has not yet started using; taking hydralazine  directly from pill bottle   Hypertension:   Current medications:  - amlodipine  10 mg daily - hydralazine  50 mg - 1 tablets three times daily - lisinopril -HCTZ 20-12.5 mg - 2 tablets daily   Reports obtained an automated, upper arm home BP cuff and started monitoring, but has not checked in a few days and does not recall previous results    Admits to sometimes adding salt to his food, but reports has cut back   Reports drinks ~2 mugs of coffee each morning   Objective:  Lab Results  Component Value Date   CREATININE 2.04 (H) 10/11/2022   BUN 48 (H) 10/11/2022   NA 137 10/11/2022   K 4.7 10/11/2022   CL 105 10/11/2022   CO2 21 10/11/2022    Lab Results  Component Value Date   CHOL 98 10/11/2022   HDL 35 (L) 10/11/2022   LDLCALC 41 10/11/2022   TRIG 137 10/11/2022   CHOLHDL 2.8 10/11/2022   BP Readings from Last 3 Encounters:  05/27/23 (!) 172/92  04/29/23 (!) 163/84  04/14/23 (!) 142/78   Pulse Readings from Last 3 Encounters:  05/27/23  (!) 108  04/29/23 (!) 103  04/14/23 71     Medications Reviewed Today     Reviewed by Ardis Becton, RPH-CPP (Pharmacist) on 06/22/23 at 0912  Med List Status: <None>   Medication Order Taking? Sig Documenting Provider Last Dose Status Informant  amLODipine  (NORVASC ) 10 MG tablet [id number] Yes TAKE 1 TABLET BY MOUTH EVERY DAY Tapia, Leisa, PA-C  Active   atorvastatin  (LIPITOR) 40 MG tablet [id number]  Take 1 tablet (40 mg total) by mouth daily. Tapia, Leisa, PA-C  Active   famotidine (PEPCID) 20 MG tablet [id number]  Take 20 mg by mouth at bedtime. [provider]  Active   FARXIGA  10 MG TABS tablet [id number]  Take 1 tablet (10 mg total) by mouth daily. Tapia, Leisa, PA-C  Active   hydrALAZINE  (APRESOLINE ) 25 MG tablet [id number] Yes TAKE 1 TABLET BY MOUTH THREE TIMES A DAY Tapia, Leisa, PA-C  Active   lisinopril -hydrochlorothiazide  (ZESTORETIC ) 20-12.5 MG tablet [id number] Yes Take 2 tablets by mouth daily. Tapia, Leisa, PA-C  Active   metFORMIN  (GLUCOPHAGE ) 500 MG tablet [id number]  TAKE 1 TABLET BY MOUTH EVERY DAY WITH BREAKFAST Tapia, Leisa, PA-C  Active   Oyster Shell Calcium  500 MG TABS [id number]  Take 1 tablet by mouth 2 (two) times daily. [provider]  Active   pantoprazole  (PROTONIX ) 40 MG tablet [id number]  Take 1 tablet (40 mg total) by mouth daily. Tapia, Leisa, PA-C  Active  Assessment/Plan:   Comprehensive medication review performed; medication list updated in electronic medical record   Discuss importance of medication adherence. Recommend use the additional pillbox to include hydralazine  in pillbox. Have discussed may also consider using daily phone alarms to aid with adherence     Hypertension: - Have reviewed long term cardiovascular and renal outcomes of uncontrolled blood pressure - Review home blood pressure monitoring technique - Have encouraged patient to limit salt/sodium intake to aid with blood pressure control -  Recommend to monitor home blood pressure and start to keep log of results and have this record to review at upcoming medical appointments. Patient to contact provider office sooner if needed for readings outside of established parameters or symptoms   Follow Up Plan: Clinical Pharmacist will follow up with patient by telephone on 06/29/2023 at 8:00 AM   to review home blood pressure monitoring results   Arthur Lash, PharmD, [facility] [phone]

## 2023-06-22 NOTE — Telephone Encounter (Signed)
 Auth Submission: NO AUTH NEEDED Site of care: Site of care: [facility] Payer: [employer] MEDICARE  Medication & CPT/J Code(s) submitted: RETACRIT ; [id number] Diagnosis Code: N18.4 Route of submission (phone, fax, portal):  Phone # Fax # Auth type: Buy/Bill HB Units/visits requested: [id number] 3X A WEEK Reference number: [id number] Approval from: 06/22/23 to 01/04/24

## 2023-06-22 NOTE — Patient Instructions (Signed)
 Goals Addressed             This Visit's Progress    Pharmacy Goals       If you need to reach out to patient assistance programs regarding refills or to find out the status of your application, you can do so by calling:  [employer] at [phone]  Check your blood pressure twice weekly, and any time you have concerning symptoms like headache, chest pain, dizziness, shortness of breath, or vision changes.   Our goal is less than 130/80.  To appropriately check your blood pressure, make sure you do the following:  1) Avoid caffeine, exercise, or tobacco products for 30 minutes before checking. Empty your bladder. 2) Sit with your back supported in a flat-backed chair. Rest your arm on something flat (arm of the chair, table, etc). 3) Sit still with your feet flat on the floor, resting, for at least 5 minutes.  4) Check your blood pressure. Take 1-2 readings.  5) Write down these readings and bring with you to any provider appointments.  Bring your home blood pressure machine with you to a provider's office for accuracy comparison at least once a year.   Make sure you take your blood pressure medications before you come to any office visit, even if you were asked to fast for labs.   Arthur Lash, PharmD, [facility] [phone]

## 2023-06-24 ENCOUNTER — Ambulatory Visit (HOSPITAL_COMMUNITY)
Admission: RE | Admit: 2023-06-24 | Discharge: 2023-06-24 | Disposition: A | Discharge status: Home / Self Care | Source: Ambulatory Visit | Attending: Internal Medicine | Admitting: Internal Medicine

## 2023-06-24 VITALS — BP 152/83 | HR 109 | Temp 98.1°F | Resp 20

## 2023-06-24 DIAGNOSIS — N184 Chronic kidney disease, stage 4 (severe): Secondary | ICD-10-CM | POA: Diagnosis not present

## 2023-06-24 DIAGNOSIS — D631 Anemia in chronic kidney disease: Secondary | ICD-10-CM | POA: Insufficient documentation

## 2023-06-24 DIAGNOSIS — N1832 Chronic kidney disease, stage 3b: Secondary | ICD-10-CM | POA: Insufficient documentation

## 2023-06-24 LAB — RENAL FUNCTION PANEL
Albumin: 4 g/dL (ref 3.5–5.0)
Anion gap: 11 (ref 5–15)
BUN: 45 mg/dL — ABNORMAL HIGH (ref 8–23)
CO2: 20 mmol/L — ABNORMAL LOW (ref 22–32)
Calcium: 8.7 mg/dL — ABNORMAL LOW (ref 8.9–10.3)
Chloride: 106 mmol/L (ref 98–111)
Creatinine, Ser: 2.88 mg/dL — ABNORMAL HIGH (ref 0.61–1.24)
GFR, Estimated: 23 mL/min — ABNORMAL LOW (ref >60)
Glucose, Bld: 220 mg/dL — ABNORMAL HIGH (ref 70–99)
Phosphorus: 4.6 mg/dL (ref 2.5–4.6)
Potassium: 4.1 mmol/L (ref 3.5–5.1)
Sodium: 137 mmol/L (ref 135–145)

## 2023-06-24 LAB — IRON AND TIBC
Iron: 55 ug/dL (ref 45–182)
Saturation Ratios: 17 % — ABNORMAL LOW (ref 17.9–39.5)
TIBC: 318 ug/dL (ref 250–450)
UIBC: 263 ug/dL

## 2023-06-24 LAB — FERRITIN: Ferritin: 74 ng/mL (ref 24–336)

## 2023-06-24 LAB — POCT HEMOGLOBIN-HEMACUE: Hemoglobin: 9.5 g/dL — ABNORMAL LOW (ref 13.0–17.0)

## 2023-06-24 MED ORDER — EPOETIN ALFA-EPBX 10000 UNIT/ML IJ SOLN
20000.0000 [IU] | INTRAMUSCULAR | Status: DC
  Administered 2023-06-24: 20000 [IU] via SUBCUTANEOUS

## 2023-06-24 MED ORDER — EPOETIN ALFA-EPBX 10000 UNIT/ML IJ SOLN
INTRAMUSCULAR | Status: AC
Start: 2023-06-24 — End: 2023-06-24
  Filled 2023-06-24: qty 2

## 2023-06-25 LAB — PTH, INTACT AND CALCIUM
Calcium, Total (PTH): 9.1 mg/dL (ref 8.6–10.2)
PTH: 78 pg/mL — ABNORMAL HIGH (ref 15–65)

## 2023-06-29 ENCOUNTER — Other Ambulatory Visit: Payer: Self-pay | Admitting: Pharmacist

## 2023-06-29 DIAGNOSIS — I10 Essential (primary) hypertension: Secondary | ICD-10-CM

## 2023-06-29 NOTE — Progress Notes (Signed)
 06/29/2023 Name: Cameron Glover MRN: [id number] DOB: [birth year]  Chief Complaint  Patient presents with   Medication Management   Medication Adherence    Cameron Glover is a 73 y.o. year old male who presented for a telephone visit.   They were referred to the pharmacist by their PCP for assistance in managing medication access.      Subjective:   Care Team: Primary Care Provider: Leavy Mole, PA-C ; Next Scheduled Visit: 07/14/2023 Urologist: Helon Clotilda DELENA DEVONNA; Next Scheduled Visit: 07/12/2023 Nephrologist: Norine Manuelita DELENA, MD; Next Scheduled Visit: 10/04/2023  Medication Access/Adherence  [facility]  [id number] [place], [place] - [address] AT [facility] [address] [place] [address] Phone: [phone] Fax: [phone]   Patient reports affordability concerns with their medications: No  Patient reports access/transportation concerns to their pharmacy: No  Patient reports adherence concerns with their medications:  No    Using weekly pillbox. Denies missed doses  Hypertension:   Current medications:  - amlodipine  10 mg daily - hydralazine  50 mg - 1 tablets three times daily - lisinopril -HCTZ 20-12.5 mg - 2 tablets daily   Reports obtained an automated, upper arm home BP cuff and started monitoring, but has not checked in a few days and does not recall previous results    Yesterday: 164/85, HR 90 (attributes to eating salty food) 6/23: 144/85, HR 88 6/22: 151/91, HR 95  Admits to sometimes adding salt to his food, but reports has cut back   Reports drinks ~2 mugs of coffee each morning  Denies any signs of swelling  Objective:  Lab Results  Component Value Date   CREATININE 2.88 (H) 06/24/2023   BUN 45 (H) 06/24/2023   NA 137 06/24/2023   K 4.1 06/24/2023   CL 106 06/24/2023   CO2 20 (L) 06/24/2023    Lab Results  Component Value Date   CHOL 98 10/11/2022   HDL 35 (L)  10/11/2022   LDLCALC 41 10/11/2022   TRIG 137 10/11/2022   CHOLHDL 2.8 10/11/2022   BP Readings from Last 3 Encounters:  06/24/23 (!) 152/83  05/27/23 (!) 172/92  04/29/23 (!) 163/84   Pulse Readings from Last 3 Encounters:  06/24/23 (!) 109  05/27/23 (!) 108  04/29/23 (!) 103    Medications Reviewed Today     Reviewed by Alana Sharyle DELENA, RPH-CPP (Pharmacist) on 06/29/23 at [phone]  Med List Status: <None>   Medication Order Taking? Sig Documenting Provider Last Dose Status Informant  amLODipine  (NORVASC ) 10 MG tablet [id number] Yes TAKE 1 TABLET BY MOUTH EVERY DAY Tapia, Leisa, PA-C  Active   atorvastatin  (LIPITOR) 40 MG tablet [id number]  Take 1 tablet (40 mg total) by mouth daily. Tapia, Leisa, PA-C  Active   famotidine (PEPCID) 20 MG tablet [id number]  Take 20 mg by mouth at bedtime. [provider]  Active   FARXIGA  10 MG TABS tablet [id number]  Take 1 tablet (10 mg total) by mouth daily. Tapia, Leisa, PA-C  Active   hydrALAZINE  (APRESOLINE ) 25 MG tablet [id number] Yes TAKE 1 TABLET BY MOUTH THREE TIMES A DAY  Patient taking differently: Take 50 mg by mouth 3 (three) times daily.   Tapia, Leisa, PA-C  Active   lisinopril -hydrochlorothiazide  (ZESTORETIC ) 20-12.5 MG tablet [id number] Yes Take 2 tablets by mouth daily. Tapia, Leisa, PA-C  Active   metFORMIN  (GLUCOPHAGE ) 500 MG tablet [id number]  TAKE 1 TABLET BY MOUTH EVERY DAY WITH BREAKFAST Tapia, Leisa, PA-C  Active   Oyster Shell Calcium  500 MG TABS [id number]  Take 1 tablet by mouth 2 (two) times daily. [provider]  Active   pantoprazole  (PROTONIX ) 40 MG tablet [id number]  Take 1 tablet (40 mg total) by mouth daily. Leavy Mole, PA-C  Active               Assessment/Plan:   Encourage patient to continue to use weekly pillbox   Hypertension: - Have reviewed long term cardiovascular and renal outcomes of uncontrolled blood pressure - Review home blood pressure monitoring technique - Encouraged  patient to limit salt/sodium intake to aid with blood pressure control - Send message to collaborate with PCP regarding patient's recent home BP readings and HTN medication management - Recommend to monitor home blood pressure and start to keep log of results and have this record to review at upcoming medical appointments. Patient to contact provider office sooner if needed for readings outside of established parameters or symptoms   Follow Up Plan: Clinical Pharmacist will follow up with patient by telephone on 08/24/2023 at 9:00 AM    Sharyle Sia, PharmD, [facility] [phone]

## 2023-07-01 NOTE — Patient Instructions (Signed)
 Goals Addressed             This Visit's Progress    Pharmacy Goals       If you need to reach out to patient assistance programs regarding refills or to find out the status of your application, you can do so by calling:  [employer] at [phone]  Check your blood pressure twice weekly, and any time you have concerning symptoms like headache, chest pain, dizziness, shortness of breath, or vision changes.   Our goal is less than 130/80.  To appropriately check your blood pressure, make sure you do the following:  1) Avoid caffeine, exercise, or tobacco products for 30 minutes before checking. Empty your bladder. 2) Sit with your back supported in a flat-backed chair. Rest your arm on something flat (arm of the chair, table, etc). 3) Sit still with your feet flat on the floor, resting, for at least 5 minutes.  4) Check your blood pressure. Take 1-2 readings.  5) Write down these readings and bring with you to any provider appointments.  Bring your home blood pressure machine with you to a provider's office for accuracy comparison at least once a year.   Make sure you take your blood pressure medications before you come to any office visit, even if you were asked to fast for labs.   Arthur Lash, PharmD, [facility] [phone]

## 2023-07-06 DIAGNOSIS — E113312 Type 2 diabetes mellitus with moderate nonproliferative diabetic retinopathy with macular edema, left eye: Secondary | ICD-10-CM | POA: Diagnosis not present

## 2023-07-06 DIAGNOSIS — H43821 Vitreomacular adhesion, right eye: Secondary | ICD-10-CM | POA: Diagnosis not present

## 2023-07-06 DIAGNOSIS — H35033 Hypertensive retinopathy, bilateral: Secondary | ICD-10-CM | POA: Diagnosis not present

## 2023-07-06 LAB — HM DIABETES EYE EXAM

## 2023-07-06 MED ORDER — HYDRALAZINE HCL 50 MG PO TABS: 75.0000 mg | ORAL_TABLET | Freq: Three times a day (TID) | ORAL | 0 refills | Status: AC

## 2023-07-06 NOTE — Addendum Note (Signed)
 Addended by: Jillian Warth on: 07/06/2023 05:51 PM   Modules accepted: Orders

## 2023-07-07 ENCOUNTER — Encounter (HOSPITAL_COMMUNITY)

## 2023-07-07 ENCOUNTER — Other Ambulatory Visit: Payer: Self-pay

## 2023-07-07 ENCOUNTER — Telehealth: Payer: Self-pay

## 2023-07-07 DIAGNOSIS — C61 Malignant neoplasm of prostate: Secondary | ICD-10-CM

## 2023-07-07 NOTE — Progress Notes (Signed)
   07/07/2023  Patient ID: Cameron Glover, male   DOB: 10/08/1950, 73 y.o.   MRN: [id number]  Subjective/Objective Patient outreach to follow-up on control of hypertension  Hypertension -Current medications:  amlodipine  10mg  daily, lisinopril /hydrochlorothiazide  40/25mg  daily, hydralazine  50mg  TID -Last OV BP 142/78 on 04/14/2023 -Patient has been monitoring home BP and recently endorsed the following readings during telephone visit with clinical pharmacist: Yesterday: 164/85, HR 90 (attributes to eating salty food) 6/23: 144/85, HR 88 6/22: 151/91, HR 95 -PCP prefers to increase hydralazine  at this time versus starting a new medication to improve BP  Assessment/Plan  Hypertension -Uncontrolled -Contacted patient to inform him to start taking 1 & one-half tablet of hydralazine  TID to help with BP control- patient endorsed understanding -New prescription was sent to pharmacy by PCP, so patient can refill when he runs low on current supply -Advised patient to continue to monitor home BP readings and bring these into upcoming f/u with PCP on 7/10 -Continue amlodipine  10mg  daily and lisinopril /hydrochlorothiazide  40/25mg  daily  Follow-up:  Scheduled with Cameron Glover 8/20  Cameron Glover, PharmD, DPLA

## 2023-07-08 LAB — PSA: Prostate Specific Ag, Serum: <0.1 ng/mL (ref 0.0–4.0)

## 2023-07-12 ENCOUNTER — Ambulatory Visit: Admitting: Urology

## 2023-07-12 ENCOUNTER — Encounter: Payer: Self-pay | Admitting: Urology

## 2023-07-12 ENCOUNTER — Ambulatory Visit: Payer: Self-pay | Admitting: Urology

## 2023-07-12 VITALS — BP 130/82 | HR 74 | Ht 60.0 in | Wt 181.0 lb

## 2023-07-12 DIAGNOSIS — R319 Hematuria, unspecified: Secondary | ICD-10-CM | POA: Diagnosis not present

## 2023-07-12 DIAGNOSIS — C61 Malignant neoplasm of prostate: Secondary | ICD-10-CM | POA: Diagnosis not present

## 2023-07-12 DIAGNOSIS — Z87898 Personal history of other specified conditions: Secondary | ICD-10-CM | POA: Diagnosis not present

## 2023-07-12 LAB — URINALYSIS, COMPLETE
Bilirubin, UA: NEGATIVE
Ketones, UA: NEGATIVE
Leukocytes,UA: NEGATIVE
Nitrite, UA: NEGATIVE
RBC, UA: NEGATIVE
Specific Gravity, UA: 1.01 (ref 1.005–1.030)
Urobilinogen, Ur: 0.2 mg/dL (ref 0.2–1.0)
pH, UA: 6 (ref 5.0–7.5)

## 2023-07-12 LAB — MICROSCOPIC EXAMINATION

## 2023-07-12 MED ORDER — LEUPROLIDE ACETATE (6 MONTH) 45 MG ~~LOC~~ KIT
45.0000 mg | PACK | Freq: Once | SUBCUTANEOUS | Status: AC
  Administered 2023-07-12: 45 mg via SUBCUTANEOUS

## 2023-07-12 NOTE — Progress Notes (Signed)
 07/12/2023 1:55 PM   Cameron Glover 1950/05/29 [id number]  Referring provider: Leavy Mole, PA-C [address] [place],  [place] [address]  Urological history: 1. High risk prostate cancer - PSA (07/2023) <0.1 - bx (2023) iPSA 44, multiple cores of Gleason 3+3 and 3+4 involving 11 of 12 cores. His TRUS volume was 79 cc's.   - bone scan (2023) no mets - gold seed placement (2024)  - ADT x 2 years (started 01/2022)   2. High risk hematuria - non smoker - CTU (2023) NED - cysto (2023) NED  3. BPH with LU TS - cysto (2023) enlarged prostate bilobar coaptation  Chief Complaint  Patient presents with   Prostate Cancer   HPI: Cameron Glover is a 73 y.o. man who presents today for Eligard  injection.    Previous records reviewed.   He reports a good appetite, no weight loss and no bone pain.  He is still having hot flashes but they are not as frequent and not as intense.  Patient denies any modifying or aggravating factors.  Patient denies any recent UTI's, gross hematuria, dysuria or suprapubic/flank pain.  Patient denies any fevers, chills, nausea or vomiting.    UA unremarkable   PMH: Past Medical History:  Diagnosis Date   Acute renal failure (ARF) (HCC)    AKI (acute kidney injury) (HCC) 09/02/2017   Anemia due to chronic kidney disease 04/15/2018   DM (diabetes mellitus) with complications (HCC) 09/02/2017   Elevated lipase 04/15/2018   Hyperglycemia without ketosis    Hyperlipidemia    Hypertension    Nuclear sclerotic cataract of left eye 05/14/2019    Surgical History: Past Surgical History:  Procedure Laterality Date   COLONOSCOPY WITH PROPOFOL  N/A 01/15/2019   Procedure: COLONOSCOPY WITH PROPOFOL ;  Surgeon: Therisa Bi, MD;  Location: [facility] ENDOSCOPY;  Service: Gastroenterology;  Laterality: N/A;    Home Medications:  Allergies as of 07/12/2023   No Known Allergies      Medication List        Accurate as of July 12, 2023  1:55 PM. If you  have any questions, ask your nurse or doctor.          amLODipine  10 MG tablet Commonly known as: NORVASC  TAKE 1 TABLET BY MOUTH EVERY DAY   atorvastatin  40 MG tablet Commonly known as: LIPITOR Take 1 tablet (40 mg total) by mouth daily.   famotidine 20 MG tablet Commonly known as: PEPCID Take 20 mg by mouth at bedtime.   Farxiga  10 MG Tabs tablet Generic drug: dapagliflozin propanediol  Take 1 tablet (10 mg total) by mouth daily.   hydrALAZINE  50 MG tablet Commonly known as: APRESOLINE  Take 1.5 tablets (75 mg total) by mouth 3 (three) times daily.   lisinopril -hydrochlorothiazide  20-12.5 MG tablet Commonly known as: ZESTORETIC  Take 2 tablets by mouth daily.   metFORMIN  500 MG tablet Commonly known as: GLUCOPHAGE  TAKE 1 TABLET BY MOUTH EVERY DAY WITH BREAKFAST   Oyster Shell Calcium  500 MG Tabs Take 1 tablet by mouth 2 (two) times daily.   pantoprazole  40 MG tablet Commonly known as: PROTONIX  Take 1 tablet (40 mg total) by mouth daily.        Allergies: No Known Allergies  Family History: Family History  Problem Relation Age of Onset   Kidney failure Sister    Cancer Sister    Diabetes Sister    Hypertension Sister    Hyperlipidemia Sister    Heart disease Sister    Kidney  disease Sister    Cancer Brother    CAD Brother    Diabetes Brother    Hypertension Brother    Hyperlipidemia Brother    Heart disease Brother    Diabetes Other    Cancer Mother     Social History: See HPI for pertinent social history  ROS: Pertinent ROS in HPI  Physical Exam: BP 130/82   Pulse 74   Ht 5' (1.524 m)   Wt 181 lb (82.1 kg)   BMI 35.35 kg/m   Constitutional:  Well nourished. Alert and oriented, No acute distress. HEENT: [place] AT, moist mucus membranes.  Trachea midline Cardiovascular: No clubbing, cyanosis, or edema. Respiratory: Normal respiratory effort, no increased work of breathing. Neurologic: Grossly intact, no focal deficits, moving all 4  extremities. Psychiatric: Normal mood and affect.  Laboratory Data: See EPIC and HPI  I have reviewed the labs.   Pertinent Imaging: N/A  Assessment & Plan:    1. Prostate cancer - PSA undetectable - Discussed with patient injection site reactions could include transient burning and stinging, pain, bruising and redness -Discussed with patient that common side effects include hot flashes, sweats, fatigue, weakness, muscle pain, dizziness, clamminess, testicular shrinkage, decreased erections and enlargement of breasts -Discussed the side effect of thinning of the bones that may lead to a fracture and it is important that he start calcium  supplementation with vitamin D  -Discussed the increased risks of heart attack, irregular heartbeat, sudden death due to heart problems and stroke -Discussed that elevated blood sugar and increased risk developing diabetes may occur   2. High risk hematuria - work up 2023 - negative for kidney and bladder cancer - UA negative for micro heme  - no reports of gross heme   Return in about 6 months (around 01/12/2024) for PSA, Eligard  .  These notes generated with voice recognition software. I apologize for typographical errors.  CLOTILDA HELON RIGGERS  [facility] [address]  [address] [place], [place] [address] [phone]

## 2023-07-12 NOTE — Progress Notes (Signed)
 Eligard  SubQ Injection   Due to Prostate Cancer patient is present today for a Eligard  Injection.  Medication: Eligard  6 month Dose: 45 mg  Location: left  Lot: [id number] Exp: 09/2023  Patient tolerated well, no complications were noted  Performed by: Harlene Franks CMA  Per Dr. Ramsey patient is to continue therapy for 6 months . Patient's next follow up was scheduled for 01/04/2024. This appointment was scheduled using wheel and given to patient today along with reminder continue on Vitamin D  800-1000iu and Calcium  1000-1200mg  daily while on Androgen Deprivation Therapy.  PA approval dates:  01/07/23-01/07/24

## 2023-07-14 ENCOUNTER — Ambulatory Visit (INDEPENDENT_AMBULATORY_CARE_PROVIDER_SITE_OTHER): Admitting: Family Medicine

## 2023-07-14 ENCOUNTER — Encounter: Payer: Self-pay | Admitting: Family Medicine

## 2023-07-14 VITALS — BP 138/68 | HR 91 | Resp 16 | Ht 60.0 in | Wt 189.0 lb

## 2023-07-14 DIAGNOSIS — D631 Anemia in chronic kidney disease: Secondary | ICD-10-CM | POA: Diagnosis not present

## 2023-07-14 DIAGNOSIS — R809 Proteinuria, unspecified: Secondary | ICD-10-CM

## 2023-07-14 DIAGNOSIS — I10 Essential (primary) hypertension: Secondary | ICD-10-CM

## 2023-07-14 DIAGNOSIS — E1129 Type 2 diabetes mellitus with other diabetic kidney complication: Secondary | ICD-10-CM

## 2023-07-14 DIAGNOSIS — N184 Chronic kidney disease, stage 4 (severe): Secondary | ICD-10-CM | POA: Diagnosis not present

## 2023-07-14 DIAGNOSIS — K219 Gastro-esophageal reflux disease without esophagitis: Secondary | ICD-10-CM | POA: Diagnosis not present

## 2023-07-14 DIAGNOSIS — N183 Chronic kidney disease, stage 3 unspecified: Secondary | ICD-10-CM

## 2023-07-14 DIAGNOSIS — E1169 Type 2 diabetes mellitus with other specified complication: Secondary | ICD-10-CM | POA: Diagnosis not present

## 2023-07-14 DIAGNOSIS — E785 Hyperlipidemia, unspecified: Secondary | ICD-10-CM | POA: Diagnosis not present

## 2023-07-14 DIAGNOSIS — Z6836 Body mass index (BMI) 36.0-36.9, adult: Secondary | ICD-10-CM

## 2023-07-14 DIAGNOSIS — E66812 Obesity, class 2: Secondary | ICD-10-CM

## 2023-07-14 MED ORDER — LISINOPRIL-HYDROCHLOROTHIAZIDE 20-12.5 MG PO TABS: 2.0000 | ORAL_TABLET | Freq: Every day | ORAL | 1 refills | Status: DC

## 2023-07-14 MED ORDER — AMLODIPINE BESYLATE 10 MG PO TABS: 10.0000 mg | ORAL_TABLET | Freq: Every day | ORAL | 1 refills | Status: DC

## 2023-07-14 NOTE — Progress Notes (Signed)
 Name: Cameron Glover   MRN: [id number]    DOB: [birth year]   Date:07/14/2023       Progress Note  Chief Complaint  Patient presents with   Medical Management of Chronic Issues    3 month follow-up   Diabetes   Hyperlipidemia   Chronic Kidney Disease     Subjective:   Cameron Glover is a 73 y.o. male, presents to clinic for routine follow up on chronic conditions  HTN and CKD meds adjusted by nephrology, c pharmacy and myself   Lab Results  Component Value Date   HGBA1C 5.9 (A) 04/14/2023  Morning sugars 120's   Lab Results  Component Value Date   CHOL 98 10/11/2022   HDL 35 (L) 10/11/2022   LDLCALC 41 10/11/2022   TRIG 137 10/11/2022   CHOLHDL 2.8 10/11/2022   Discussed the use of AI scribe software for clinical note transcription with the patient, who gave verbal consent to proceed.  History of Present Illness Cameron Glover is a 73 year old male with hypertension who presents for follow-up.  Blood pressure variability - Fluctuating blood pressure despite recent medication adjustments - Hydralazine  dose increased to 75 mg three times daily; difficulty splitting pills and has not yet obtained a pill splitter - Recent blood pressure reading of 138/68 mmHg - Continues amlodipine  and lisinopril -hydrochlorothiazide  as part of regimen  Glycemic control - Metformin  500 mg daily and Farxiga  for diabetes management - Blood glucose levels around 120 mg/dL - No new symptoms or concerns related to diabetes  Chronic kidney disease monitoring - Chronic kidney disease monitored by nephrologist every three months - Last nephrology visit in September - Does not receive lab results directly  Lipid management - Cholesterol levels last checked in October of previous year - No issues with cholesterol medication  Gastrointestinal symptoms - Takes pantoprazole  daily for stomach issues - No indigestion, nausea, or abdominal pain      Current Outpatient Medications:     amLODipine  (NORVASC ) 10 MG tablet, TAKE 1 TABLET BY MOUTH EVERY DAY, Disp: 90 tablet, Rfl: 1   atorvastatin  (LIPITOR) 40 MG tablet, Take 1 tablet (40 mg total) by mouth daily., Disp: 90 tablet, Rfl: 3   famotidine (PEPCID) 20 MG tablet, Take 20 mg by mouth at bedtime., Disp: , Rfl:    FARXIGA  10 MG TABS tablet, Take 1 tablet (10 mg total) by mouth daily., Disp: 90 tablet, Rfl: 1   hydrALAZINE  (APRESOLINE ) 50 MG tablet, Take 1.5 tablets (75 mg total) by mouth 3 (three) times daily., Disp: 405 tablet, Rfl: 0   lisinopril -hydrochlorothiazide  (ZESTORETIC ) 20-12.5 MG tablet, Take 2 tablets by mouth daily., Disp: 180 tablet, Rfl: 1   metFORMIN  (GLUCOPHAGE ) 500 MG tablet, TAKE 1 TABLET BY MOUTH EVERY DAY WITH BREAKFAST, Disp: 90 tablet, Rfl: 1   Oyster Shell Calcium  500 MG TABS, Take 1 tablet by mouth 2 (two) times daily., Disp: , Rfl:    pantoprazole  (PROTONIX ) 40 MG tablet, Take 1 tablet (40 mg total) by mouth daily., Disp: 90 tablet, Rfl: 3  Patient Active Problem List   Diagnosis Date Noted   Prostate cancer (HCC) 01/13/2023   Stage 3b chronic kidney disease (HCC) 03/16/2022   Class 1 obesity with serious comorbidity and body mass index (BMI) of 30.0 to 30.9 in adult 03/16/2022   History of prostate cancer 08/17/2021   Grade 3 hypertensive retinopathy, bilateral 04/29/2021   Uncontrolled diabetes mellitus with hyperglycemia, without long-term current use of insulin  (HCC) 03/30/2021   Snores 11/17/2020  Pseudophakia of left eye 10/13/2020   Anemia 08/11/2020   Gastroesophageal reflux disease 08/11/2020   Vitamin B12 deficiency 08/11/2020   Vitamin D  deficiency 08/11/2020   Elevated parathyroid hormone 08/11/2020   Secondary hyperparathyroidism (HCC) 08/11/2020   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   Severe nonproliferative diabetic retinopathy of left eye, with macular edema, associated with type 2 diabetes mellitus (HCC) 05/14/2019   Severe nonproliferative diabetic retinopathy  of right eye, with macular edema, associated with type 2 diabetes mellitus (HCC) 05/14/2019   Retinal hemorrhage of right eye 05/14/2019   Retinal hemorrhage of left eye 05/14/2019   Nuclear sclerotic cataract of right eye 05/14/2019   LVH (left ventricular hypertrophy) 03/23/2019   Hyperlipidemia associated with type 2 diabetes mellitus (HCC) 09/22/2018   CKD stage 3 due to type 2 diabetes mellitus (HCC) 09/22/2018   Essential hypertension 04/15/2018   Anemia due to stage 4 chronic kidney disease (HCC) 04/15/2018   Controlled type 2 diabetes mellitus with microalbuminuria, without long-term current use of insulin  (HCC) 09/02/2017    Past Surgical History:  Procedure Laterality Date   COLONOSCOPY WITH PROPOFOL  N/A 01/15/2019   Procedure: COLONOSCOPY WITH PROPOFOL ;  Surgeon: Therisa Bi, MD;  Location: [facility] ENDOSCOPY;  Service: Gastroenterology;  Laterality: N/A;    Family History  Problem Relation Age of Onset   Kidney failure Sister    Cancer Sister    Diabetes Sister    Hypertension Sister    Hyperlipidemia Sister    Heart disease Sister    Kidney disease Sister    Cancer Brother    CAD Brother    Diabetes Brother    Hypertension Brother    Hyperlipidemia Brother    Heart disease Brother    Diabetes Other    Cancer Mother     Social History   Tobacco Use   Smoking status: Never    Passive exposure: Never   Smokeless tobacco: Never  Vaping Use   Vaping status: Never Used  Substance Use Topics   Alcohol use: Never   Drug use: Never     No Known Allergies  Health Maintenance  Topic Date Due   Medicare Annual Wellness (AWV)  06/17/2023   Diabetic kidney evaluation - Urine ACR  06/21/2023   OPHTHALMOLOGY EXAM  07/14/2023 (Originally 05/27/2023)   Zoster Vaccines- Shingrix (1 of 2) 07/14/2023 (Originally 11/14/1969)   COVID-19 Vaccine (4 - 2024-25 season) 07/29/2023 (Originally 09/05/2022)   DTaP/Tdap/Td (1 - Tdap) 10/11/2023 (Originally 11[birth year]14/1969)   INFLUENZA  VACCINE  08/05/2023   HEMOGLOBIN A1C  10/14/2023   Colonoscopy  01/15/2024   FOOT EXAM  04/13/2024   Diabetic kidney evaluation - eGFR measurement  06/23/2024   Pneumococcal Vaccine: 50+ Years  Completed   Hepatitis C Screening  Completed   Hepatitis B Vaccines  Aged Out   HPV VACCINES  Aged Out   Meningococcal B Vaccine  Aged Out    Chart Review Today: I personally reviewed active problem list, medication list, allergies, family history, social history, health maintenance, notes from last encounter, lab results, imaging with the patient/caregiver today.   Review of Systems  Constitutional: Negative.   HENT: Negative.    Eyes: Negative.   Respiratory: Negative.    Cardiovascular: Negative.   Gastrointestinal: Negative.   Endocrine: Negative.   Genitourinary: Negative.   Musculoskeletal: Negative.   Skin: Negative.   Allergic/Immunologic: Negative.   Neurological: Negative.   Hematological: Negative.   Psychiatric/Behavioral: Negative.    All other systems reviewed and are  negative.    Objective:   Vitals:   07/14/23 1017  BP: 138/68  Pulse: 91  Resp: 16  SpO2: 98%  Weight: 189 lb (85.7 kg)  Height: 5' (1.524 m)    Body mass index is 36.91 kg/m.  Physical Exam Vitals and nursing note reviewed.  Constitutional:      General: He is not in acute distress.    Appearance: Normal appearance. He is well-developed. He is not ill-appearing, toxic-appearing or diaphoretic.  HENT:     Head: Normocephalic and atraumatic.     Right Ear: External ear normal.     Left Ear: External ear normal.     Nose: Nose normal.  Eyes:     General: No scleral icterus.       Right eye: No discharge.        Left eye: No discharge.     Conjunctiva/sclera: Conjunctivae normal.  Neck:     Trachea: No tracheal deviation.  Cardiovascular:     Rate and Rhythm: Normal rate.  Pulmonary:     Effort: Pulmonary effort is normal. No respiratory distress.     Breath sounds: No stridor.   Skin:    General: Skin is warm and dry.     Findings: No rash.  Neurological:     Mental Status: He is alert.     Motor: No abnormal muscle tone.     Coordination: Coordination normal.     Gait: Gait normal.  Psychiatric:        Mood and Affect: Mood normal.        Behavior: Behavior normal.      Functional Status Survey:   Results for orders placed or performed in visit on 07/12/23  Microscopic Examination   Collection Time: 07/12/23  1:34 PM   Urine  Result Value Ref Range   WBC, UA 0-5 0 - 5 /hpf   RBC, Urine 0-2 0 - 2 /hpf   Epithelial Cells (non renal) 0-10 0 - 10 /hpf   Casts Present (A) None seen /lpf   Cast Type Hyaline casts N/A   Mucus, UA Present (A) Not Estab.   Bacteria, UA Few None seen/Few  Urinalysis, Complete   Collection Time: 07/12/23  1:34 PM  Result Value Ref Range   Specific Gravity, UA 1.010 1.005 - 1.030   pH, UA 6.0 5.0 - 7.5   Color, UA Yellow Yellow   Appearance Ur Clear Clear   Leukocytes,UA Negative Negative   Protein,UA 1+ (A) Negative/Trace   Glucose, UA 3+ (A) Negative   Ketones, UA Negative Negative   RBC, UA Negative Negative   Bilirubin, UA Negative Negative   Urobilinogen, Ur 0.2 0.2 - 1.0 mg/dL   Nitrite, UA Negative Negative   Microscopic Examination See below:       Assessment & Plan:   Controlled type 2 diabetes mellitus with microalbuminuria, without long-term current use of insulin  (HCC) Assessment & Plan: Diabetes is well controlled with an A1c at target levels. He is on a low dose of metformin  due to kidney function and is also taking Farxiga , which provides renal protection and aids in diabetes management. Blood sugar levels are stable around 120 mg/dL. Farxiga  was obtained for a year without cost due to assistance from the pharmacist. - Continue metformin  500 mg daily and Farxiga  10 mg daily. - Recheck A1c and blood sugar levels in three months. Lab Results  Component Value Date   HGBA1C 5.9 (A) 04/14/2023    Additional gaps and  kidney care we will f/up on in 3 months after he sees nephrology in 31m    Essential hypertension Assessment & Plan: Blood pressure is generally better controlled with the increased dose of hydralazine . Current regimen includes amlodipine , lisinopril -hydrochlorothiazide , and hydralazine . Blood pressure today was 138/68 mmHg, which is within acceptable range. He is at risk for stroke if blood pressure remains high. The nephrologist recommended increasing hydralazine  to 1.5 tablets, but he finds it difficult to split the pill accurately. - Continue current antihypertensive regimen: amlodipine , lisinopril -hydrochlorothiazide , and hydralazine  50 mg 1.5 tablets TID. - Obtain a pill splitter to facilitate accurate dosing of hydralazine . - Monitor blood pressure regularly. BP Readings from Last 3 Encounters:  07/14/23 138/68  07/12/23 130/82  06/24/23 (!) 152/83   Continue working with pharmacist and nephrologist on monitoring and controlling BP Today much better and near goal  Orders: -     amLODIPine  Besylate; Take 1 tablet (10 mg total) by mouth daily.  Dispense: 90 tablet; Refill: 1 -     Lisinopril -hydroCHLOROthiazide ; Take 2 tablets by mouth daily.  Dispense: 180 tablet; Refill: 1  CKD (chronic kidney disease) stage 4, GFR 15-29 ml/min (HCC) Assessment & Plan: Chronic Kidney Disease Stage 3 He is under the care of a nephrologist and is taking Farxiga  for renal protection. There is a delay in receiving lab results from the nephrologist. - Continue current management with Farxiga . - Request records from nephrologist after the September visit to review lab results.   Anemia due to stage 4 chronic kidney disease [facility] Assessment & Plan: Per nephrology   Hyperlipidemia associated with type 2 diabetes mellitus [facility]) Assessment & Plan: Doing well on statin  Cholesterol levels were last checked in October of the previous year. He is on atorvastatin  for lipid  management. Cholesterol recheck is planned in three months. - Recheck cholesterol levels in three months. Lab Results  Component Value Date   CHOL 98 10/11/2022   HDL 35 (L) 10/11/2022   LDLCALC 41 10/11/2022   TRIG 137 10/11/2022   CHOLHDL 2.8 10/11/2022  Continue atorvastatin  current dose Recheck labs in 3 months    Gastroesophageal reflux disease without esophagitis Assessment & Plan: He is currently taking pantoprazole  for GERD symptoms, which are well controlled. The goal is to determine if he can manage symptoms without continuous use of pantoprazole . If symptoms recur or worsen, a referral to a GI specialist may be necessary. - Attempt to discontinue pantoprazole  and monitor for recurrence of symptoms. - Continue famotidine as needed for symptom control. - If symptoms recur or worsen, consider referral to a GI specialist.   Class 2 severe obesity with serious comorbidity and body mass index (BMI) of 36.0 to 36.9 in adult, unspecified obesity type [facility]) Assessment & Plan: Wt Readings from Last 5 Encounters:  07/14/23 189 lb (85.7 kg)  07/12/23 181 lb (82.1 kg)  04/14/23 181 lb (82.1 kg)  01/18/23 185 lb (83.9 kg)  01/13/23 184 lb 8 oz (83.7 kg)   BMI Readings from Last 5 Encounters:  07/14/23 36.91 kg/m  07/12/23 35.35 kg/m  04/14/23 25.97 kg/m  01/18/23 26.54 kg/m  01/13/23 31.67 kg/m   Weight increased some Multiple associated comorbidities including HTN, DM, HLD, CKD stage 4, LVH Diet and lifestyle efforts along with good medication compliance encouraged       Assessment & Plan   Prostate Cancer Prostate cancer - per urology and rad onc  Follow-up He is scheduled for follow-up in three months to reassess blood pressure, diabetes,  cholesterol, and kidney function. Lab tests will be performed at the next visit. - Schedule follow-up appointment in three months. - Perform necessary lab tests at the next visit, including cholesterol and blood sugar  levels.  Recording duration: 12 minutes    Return in about 3 months (around 10/14/2023) for A1c lipids DM HTN HLD f/up  .   Michelene Cower, PA-C 07/14/23 10:45 AM

## 2023-07-14 NOTE — Assessment & Plan Note (Signed)
 Diabetes is well controlled with an A1c at target levels. He is on a low dose of metformin  due to kidney function and is also taking Farxiga , which provides renal protection and aids in diabetes management. Blood sugar levels are stable around 120 mg/dL. Farxiga  was obtained for a year without cost due to assistance from the pharmacist. - Continue metformin  500 mg daily and Farxiga  10 mg daily. - Recheck A1c and blood sugar levels in three months. Lab Results  Component Value Date   HGBA1C 5.9 (A) 04/14/2023   Additional gaps and kidney care we will f/up on in 3 months after he sees nephrology in 70m

## 2023-07-14 NOTE — Assessment & Plan Note (Signed)
 Blood pressure is generally better controlled with the increased dose of hydralazine . Current regimen includes amlodipine , lisinopril -hydrochlorothiazide , and hydralazine . Blood pressure today was 138/68 mmHg, which is within acceptable range. He is at risk for stroke if blood pressure remains high. The nephrologist recommended increasing hydralazine  to 1.5 tablets, but he finds it difficult to split the pill accurately. - Continue current antihypertensive regimen: amlodipine , lisinopril -hydrochlorothiazide , and hydralazine  50 mg 1.5 tablets TID. - Obtain a pill splitter to facilitate accurate dosing of hydralazine . - Monitor blood pressure regularly. BP Readings from Last 3 Encounters:  07/14/23 138/68  07/12/23 130/82  06/24/23 (!) 152/83   Continue working with pharmacist and nephrologist on monitoring and controlling BP Today much better and near goal

## 2023-07-14 NOTE — Patient Instructions (Signed)
 GERD reflux -  See I you are able to wean off the pantoprazole , and you can continue to use pepcid/famotidine  If you cannot stop pantoprazole  and you're symptoms always return or worsen I need to know this at your next appointment so I can get you to consult with GI.

## 2023-07-14 NOTE — Assessment & Plan Note (Signed)
 Wt Readings from Last 5 Encounters:  07/14/23 189 lb (85.7 kg)  07/12/23 181 lb (82.1 kg)  04/14/23 181 lb (82.1 kg)  01/18/23 185 lb (83.9 kg)  01/13/23 184 lb 8 oz (83.7 kg)   BMI Readings from Last 5 Encounters:  07/14/23 36.91 kg/m  07/12/23 35.35 kg/m  04/14/23 25.97 kg/m  01/18/23 26.54 kg/m  01/13/23 31.67 kg/m   Weight increased some Multiple associated comorbidities including HTN, DM, HLD, CKD stage 4, LVH Diet and lifestyle efforts along with good medication compliance encouraged

## 2023-07-14 NOTE — Assessment & Plan Note (Signed)
 Chronic Kidney Disease Stage 3 He is under the care of a nephrologist and is taking Farxiga  for renal protection. There is a delay in receiving lab results from the nephrologist. - Continue current management with Farxiga . - Request records from nephrologist after the September visit to review lab results.

## 2023-07-14 NOTE — Assessment & Plan Note (Signed)
 Doing well on statin  Cholesterol levels were last checked in October of the previous year. He is on atorvastatin  for lipid management. Cholesterol recheck is planned in three months. - Recheck cholesterol levels in three months. Lab Results  Component Value Date   CHOL 98 10/11/2022   HDL 35 (L) 10/11/2022   LDLCALC 41 10/11/2022   TRIG 137 10/11/2022   CHOLHDL 2.8 10/11/2022  Continue atorvastatin  current dose Recheck labs in 3 months

## 2023-07-14 NOTE — Assessment & Plan Note (Signed)
 He is currently taking pantoprazole  for GERD symptoms, which are well controlled. The goal is to determine if he can manage symptoms without continuous use of pantoprazole . If symptoms recur or worsen, a referral to a GI specialist may be necessary. - Attempt to discontinue pantoprazole  and monitor for recurrence of symptoms. - Continue famotidine as needed for symptom control. - If symptoms recur or worsen, consider referral to a GI specialist.

## 2023-07-14 NOTE — Assessment & Plan Note (Signed)
 Per nephrology

## 2023-07-22 ENCOUNTER — Ambulatory Visit (HOSPITAL_COMMUNITY)
Admission: RE | Admit: 2023-07-22 | Discharge: 2023-07-22 | Disposition: A | Discharge status: Home / Self Care | Source: Ambulatory Visit | Attending: Internal Medicine | Admitting: Internal Medicine

## 2023-07-22 VITALS — BP 154/87 | HR 112 | Temp 97.2°F | Resp 16

## 2023-07-22 DIAGNOSIS — N184 Chronic kidney disease, stage 4 (severe): Secondary | ICD-10-CM | POA: Insufficient documentation

## 2023-07-22 DIAGNOSIS — D631 Anemia in chronic kidney disease: Secondary | ICD-10-CM | POA: Insufficient documentation

## 2023-07-22 DIAGNOSIS — N1832 Chronic kidney disease, stage 3b: Secondary | ICD-10-CM | POA: Insufficient documentation

## 2023-07-22 LAB — IRON AND TIBC
Iron: 54 ug/dL (ref 45–182)
Saturation Ratios: 17 % — ABNORMAL LOW (ref 17.9–39.5)
TIBC: 325 ug/dL (ref 250–450)
UIBC: 271 ug/dL

## 2023-07-22 LAB — FERRITIN: Ferritin: 66 ng/mL (ref 24–336)

## 2023-07-22 LAB — POCT HEMOGLOBIN-HEMACUE: Hemoglobin: 10.2 g/dL — ABNORMAL LOW (ref 13.0–17.0)

## 2023-07-22 MED ORDER — EPOETIN ALFA-EPBX 10000 UNIT/ML IJ SOLN
20000.0000 [IU] | INTRAMUSCULAR | Status: DC
  Administered 2023-07-22: 20000 [IU] via SUBCUTANEOUS

## 2023-07-22 MED ORDER — EPOETIN ALFA-EPBX 10000 UNIT/ML IJ SOLN
INTRAMUSCULAR | Status: AC
  Filled 2023-07-22: qty 2

## 2023-08-03 ENCOUNTER — Telehealth (HOSPITAL_COMMUNITY): Payer: Self-pay

## 2023-08-03 NOTE — Telephone Encounter (Signed)
 Auth Submission: NO AUTH NEEDED Site of care: Site of care: [facility] Payer: [employer] Medicare Medication & CPT/J Code(s) submitted: Feraheme (ferumoxytol) [id number] Diagnosis Code: D63.1 Route of submission (phone, fax, portal):  Phone # Fax # Auth type: Buy/Bill HB Units/visits requested: 510mg  x 1 dose Reference number:  Approval from: 08/03/23 to 12/04/23

## 2023-08-05 ENCOUNTER — Ambulatory Visit

## 2023-08-05 VITALS — BP 154/87 | Ht 64.0 in | Wt 181.0 lb

## 2023-08-05 DIAGNOSIS — R809 Proteinuria, unspecified: Secondary | ICD-10-CM | POA: Diagnosis not present

## 2023-08-05 DIAGNOSIS — Z2821 Immunization not carried out because of patient refusal: Secondary | ICD-10-CM | POA: Diagnosis not present

## 2023-08-05 DIAGNOSIS — Z0000 Encounter for general adult medical examination without abnormal findings: Secondary | ICD-10-CM

## 2023-08-05 DIAGNOSIS — Z0001 Encounter for general adult medical examination with abnormal findings: Secondary | ICD-10-CM | POA: Diagnosis not present

## 2023-08-05 DIAGNOSIS — E1129 Type 2 diabetes mellitus with other diabetic kidney complication: Secondary | ICD-10-CM

## 2023-08-05 NOTE — Progress Notes (Signed)
 Because this visit was a virtual/telehealth visit,  certain criteria was not obtained, such a blood pressure, CBG if applicable, and timed get up and go. Any medications not marked as taking were not mentioned during the medication reconciliation part of the visit. Any vitals not documented were not able to be obtained due to this being a telehealth visit or patient was unable to self-report a recent blood pressure reading due to a lack of equipment at home via telehealth. Vitals that have been documented are verbally provided by the patient.   This visit was performed by a medical professional under my direct supervision. I was immediately available for consultation/collaboration. I have reviewed and agree with the Annual Wellness Visit documentation.  Subjective:   Cameron Glover is a 73 y.o. who presents for a Medicare Wellness preventive visit.  As a reminder, Annual Wellness Visits don't include a physical exam, and some assessments may be limited, especially if this visit is performed virtually. We may recommend an in-person follow-up visit with your provider if needed.  Visit Complete: Virtual I connected with  Cameron Glover on 08/05/23 by a audio enabled telemedicine application and verified that I am speaking with the correct person using two identifiers.  Patient Location: Home  Provider Location: Home Office  I discussed the limitations of evaluation and management by telemedicine. The patient expressed understanding and agreed to proceed.  Vital Signs: Because this visit was a virtual/telehealth visit, some criteria may be missing or patient reported. Any vitals not documented were not able to be obtained and vitals that have been documented are patient reported.  VideoDeclined- This patient declined Librarian, academic. Therefore the visit was completed with audio only.  Persons Participating in Visit: Patient.  AWV Questionnaire: No: Patient Medicare  AWV questionnaire was not completed prior to this visit.  Cardiac Risk Factors include: advanced age (>49men, >51 women);male gender;diabetes mellitus;obesity (BMI >30kg/m2);hypertension;dyslipidemia     Objective:    Today's Vitals   08/05/23 0900  BP: (!) 154/87  Weight: 181 lb (82.1 kg)  Height: 5' 4 (1.626 m)   Body mass index is 31.07 kg/m.     08/05/2023    9:04 AM 06/17/2022    2:47 PM 12/23/2021    2:01 PM 05/26/2021    4:04 PM 04/08/2020    2:26 PM 04/05/2019    2:59 PM 01/15/2019    8:06 AM  Advanced Directives  Does Patient Have a Medical Advance Directive? No No No No No No No  Would patient like information on creating a medical advance directive? No - Patient declined  No - Patient declined Yes (MAU/Ambulatory/Procedural Areas - Information given) Yes (MAU/Ambulatory/Procedural Areas - Information given) No - Patient declined No - Patient declined    Current Medications (verified) Outpatient Encounter Medications as of 08/05/2023  Medication Sig   amLODipine  (NORVASC ) 10 MG tablet Take 1 tablet (10 mg total) by mouth daily.   atorvastatin  (LIPITOR) 40 MG tablet Take 1 tablet (40 mg total) by mouth daily.   famotidine (PEPCID) 20 MG tablet Take 20 mg by mouth at bedtime.   FARXIGA  10 MG TABS tablet Take 1 tablet (10 mg total) by mouth daily.   hydrALAZINE  (APRESOLINE ) 50 MG tablet Take 1.5 tablets (75 mg total) by mouth 3 (three) times daily.   lisinopril -hydrochlorothiazide  (ZESTORETIC ) 20-12.5 MG tablet Take 2 tablets by mouth daily.   metFORMIN  (GLUCOPHAGE ) 500 MG tablet TAKE 1 TABLET BY MOUTH EVERY DAY WITH BREAKFAST   Oyster Shell Calcium   500 MG TABS Take 1 tablet by mouth 2 (two) times daily.   pantoprazole  (PROTONIX ) 40 MG tablet Take 1 tablet (40 mg total) by mouth daily.   No facility-administered encounter medications on file as of 08/05/2023.    Allergies (verified) Patient has no known allergies.   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   Elevated lipase 04/15/2018   Hyperglycemia without ketosis    Hyperlipidemia    Hypertension    Nuclear sclerotic cataract of left eye 05/14/2019   Past Surgical History:  Procedure Laterality Date   COLONOSCOPY WITH PROPOFOL  N/A 01/15/2019   Procedure: COLONOSCOPY WITH PROPOFOL ;  Surgeon: Therisa Bi, MD;  Location: [facility] ENDOSCOPY;  Service: Gastroenterology;  Laterality: N/A;   Family History  Problem Relation Age of Onset   Kidney failure Sister    Cancer Sister    Diabetes Sister    Hypertension Sister    Hyperlipidemia Sister    Heart disease Sister    Kidney disease Sister    Cancer Brother    CAD Brother    Diabetes Brother    Hypertension Brother    Hyperlipidemia Brother    Heart disease Brother    Diabetes Other    Cancer Mother    Social History   Socioeconomic History   Marital status: Single    Spouse name: Not on file   Number of children: Not on file   Years of education: Not on file   Highest education level: High school graduate  Occupational History   Occupation: Retired    Comment: Haematologist  Tobacco Use   Smoking status: Never    Passive exposure: Never   Smokeless tobacco: Never  Vaping Use   Vaping status: Never Used  Substance and Sexual Activity   Alcohol use: Never   Drug use: Never   Sexual activity: Yes    Comment: couple male partners  Other Topics Concern   Not on file  Social History Narrative   Pt lives with his nephew   Social Drivers of Corporate investment banker Strain: Low Risk  (08/05/2023)   Overall Financial Resource Strain (CARDIA)    Difficulty of Paying Living Expenses: Not very hard  Food Insecurity: No Food Insecurity (08/05/2023)   Hunger Vital Sign    Worried About Running Out of Food in the Last Year: Never true    Ran Out of  Food in the Last Year: Never true  Transportation Needs: No Transportation Needs (08/05/2023)   PRAPARE - Administrator, Civil Service (Medical): No    Lack of Transportation (Non-Medical): No  Physical Activity: Insufficiently Active (08/05/2023)   Exercise Vital Sign    Days of Exercise per Week: 7 days    Minutes of Exercise per Session: 10 min  Stress: No Stress Concern Present (08/05/2023)   [employer] of Occupational Health - Occupational Stress Questionnaire    Feeling of Stress: Not at all  Social Connections: Socially Isolated (08/05/2023)   Social Connection and Isolation Panel    Frequency of Communication with Friends and Family: More than three times a week    Frequency of Social Gatherings with Friends and Family: More than three times a week    Attends Religious Services: Never    Production manager of [employer]  or Organizations: No    Attends Banker Meetings: Never    Marital Status: Divorced    Tobacco Counseling Counseling given: Not Answered    Clinical Intake:  Pre-visit preparation completed: Yes  Pain : No/denies pain     BMI - recorded: 31.07 Nutritional Status: BMI > 30  Obese Nutritional Risks: None Diabetes: Yes CBG done?: No Did pt. bring in CBG monitor from home?: No  Lab Results  Component Value Date   HGBA1C 5.9 (A) 04/14/2023   HGBA1C 6.6 (A) 01/13/2023   HGBA1C 7.1 (H) 10/11/2022     How often do you need to have someone help you when you read instructions, pamphlets, or other written materials from your doctor or pharmacy?: 1 - Never What is the last grade level you completed in school?: HS  Interpreter Needed?: No  Information entered by :: Minie Roadcap,CMA   Activities of Daily Living     08/05/2023    9:03 AM 10/11/2022   10:40 AM  In your present state of health, do you have any difficulty performing the following activities:  Hearing? 0 0  Vision? 0 0  Difficulty concentrating or making decisions?  0 0  Walking or climbing stairs? 0 0  Dressing or bathing? 0 0  Doing errands, shopping? 0 0  Preparing Food and eating ? N   Using the Toilet? N   In the past six months, have you accidently leaked urine? N   Do you have problems with loss of bowel control? N   Managing your Medications? N   Managing your Finances? N   Housekeeping or managing your Housekeeping? N     Patient Care Team: Tapia, Leisa, PA-C as PCP - General (Family Medicine) [facility] Delles, Sharyle LABOR, RPH-CPP as Pharmacist  I have updated your Care Teams any recent Medical Services you may have received from other providers in the past year.     Assessment:   This is a routine wellness examination for Pratyush.  Hearing/Vision screen Hearing Screening - Comments:: No difficulties Vision Screening - Comments:: Patient has glasses    Goals Addressed             This Visit's Progress    Increase physical activity   On track    Recommend increasing physical activity to 150 minutes per week        Depression Screen     08/05/2023    9:04 AM 10/11/2022   10:40 AM 06/21/2022   10:04 AM 06/17/2022    2:45 PM 04/20/2022   10:39 AM 03/16/2022    9:31 AM 03/04/2022    8:59 AM  PHQ 2/9 Scores  PHQ - 2 Score 0 0 0 0 0 0 0  PHQ- 9 Score 0 0 0  0 0 0    Fall Risk     08/05/2023    9:04 AM 01/13/2023   10:45 AM 10/11/2022   10:40 AM 06/21/2022   10:04 AM 06/17/2022    2:44 PM  Fall Risk   Falls in the past year? 0 0 0 0 0  Number falls in past yr: 0 0 0 0 0  Injury with Fall? 0 0 0 0 0  Risk for fall due to : No Fall Risks  No Fall Risks No Fall Risks No Fall Risks  Follow up Falls evaluation completed Falls evaluation completed Falls prevention discussed;Education provided;Falls evaluation completed Education provided;Falls prevention discussed;Falls evaluation completed Education provided;Falls prevention discussed  MEDICARE RISK AT HOME:  Medicare Risk at Home Any stairs in or  around the home?: Yes If so, are there any without handrails?: No Home free of loose throw rugs in walkways, pet beds, electrical cords, etc?: Yes Adequate lighting in your home to reduce risk of falls?: Yes Life alert?: No Use of a cane, walker or w/c?: No Grab bars in the bathroom?: No Shower chair or bench in shower?: No Elevated toilet seat or a handicapped toilet?: No  TIMED UP AND GO:  Was the test performed?  No  Cognitive Function: 6CIT completed        08/05/2023    9:02 AM 06/17/2022    2:50 PM 04/08/2020    2:29 PM 04/05/2019    3:04 PM  6CIT Screen  What Year? 0 points 0 points 0 points 0 points  What month? 0 points 0 points 0 points 0 points  What time? 0 points 0 points 0 points 0 points  Count back from 20 0 points 0 points 0 points 0 points  Months in reverse 0 points 4 points 4 points 4 points  Repeat phrase 0 points 2 points 0 points 2 points  Total Score 0 points 6 points 4 points 6 points    Immunizations Immunization History  Administered Date(s) Administered   Fluad Quad(high Dose 65+) 02/12/2021   Fluad Trivalent(High Dose 65+) 10/11/2022   Influenza,inj,Quad PF,6+ Mos 10/04/2017   Influenza-Unspecified 10/04/2018   PFIZER(Purple Top)SARS-COV-2 Vaccination 03/04/2019, 03/28/2019, 01/15/2020   PNEUMOCOCCAL CONJUGATE-20 08/11/2020   Pneumococcal Conjugate-13 07/23/2019   Pneumococcal Polysaccharide-23 09/03/2017    Screening Tests Health Maintenance  Topic Date Due   Zoster Vaccines- Shingrix (1 of 2) Never done   COVID-19 Vaccine (4 - 2024-25 season) 09/05/2022   Diabetic kidney evaluation - Urine ACR  06/21/2023   INFLUENZA VACCINE  08/05/2023   DTaP/Tdap/Td (1 - Tdap) 10/11/2023 (Originally 11/14/1969)   HEMOGLOBIN A1C  10/14/2023   Colonoscopy  01/15/2024   FOOT EXAM  04/13/2024   Diabetic kidney evaluation - eGFR measurement  06/23/2024   OPHTHALMOLOGY EXAM  07/05/2024   Medicare Annual Wellness (AWV)  08/04/2024   Pneumococcal Vaccine:  50+ Years  Completed   Hepatitis C Screening  Completed   Hepatitis B Vaccines  Aged Out   HPV VACCINES  Aged Out   Meningococcal B Vaccine  Aged Out    Health Maintenance  Health Maintenance Due  Topic Date Due   Zoster Vaccines- Shingrix (1 of 2) Never done   COVID-19 Vaccine (4 - 2024-25 season) 09/05/2022   Diabetic kidney evaluation - Urine ACR  06/21/2023   INFLUENZA VACCINE  08/05/2023   Health Maintenance Items Addressed: Labs Ordered: diabetic kidney exam ordered . Patient declined vaccinations  Additional Screening:  Vision Screening: Recommended annual ophthalmology exams for early detection of glaucoma and other disorders of the eye. Would you like a referral to an eye doctor? No    Dental Screening: Recommended annual dental exams for proper oral hygiene  Community Resource Referral / Chronic Care Management: CRR required this visit?  No   CCM required this visit?  No   Plan:    I have personally reviewed and noted the following in the patient's chart:   Medical and social history Use of alcohol, tobacco or illicit drugs  Current medications and supplements including opioid prescriptions. Patient is not currently taking opioid prescriptions. Functional ability and status Nutritional status Physical activity Advanced directives List of other physicians Hospitalizations, surgeries, and ER  visits in previous 12 months Vitals Screenings to include cognitive, depression, and falls Referrals and appointments  In addition, I have reviewed and discussed with patient certain preventive protocols, quality metrics, and best practice recommendations. A written personalized care plan for preventive services as well as general preventive health recommendations were provided to patient.   Lyle MARLA Right, [place]   08/05/2023   After Visit Summary: (MyChart) Due to this being a telephonic visit, the after visit summary with patients personalized plan was offered to  patient via MyChart   Notes: Nothing significant to report at this time.

## 2023-08-05 NOTE — Patient Instructions (Signed)
 Cameron Glover , Thank you for taking time out of your busy schedule to complete your Annual Wellness Visit with me. I enjoyed our conversation and look forward to speaking with you again next year. I, as well as your care team,  appreciate your ongoing commitment to your health goals. Please review the following plan we discussed and let me know if I can assist you in the future. Your Game plan/ To Do List    Referrals: If you haven't heard from the office you've been referred to, please reach out to them at the phone provided.   Follow up Visits: We will see or speak with you next year for your Next Medicare AWV with our clinical staff Have you seen your provider in the last 6 months (3 months if uncontrolled diabetes)? Yes  Clinician Recommendations:  Aim for 30 minutes of exercise or brisk walking, 6-8 glasses of water, and 5 servings of fruits and vegetables each day.       This is a list of the screenings recommended for you:  Health Maintenance  Topic Date Due   Zoster (Shingles) Vaccine (1 of 2) Never done   COVID-19 Vaccine (4 - 2024-25 season) 09/05/2022   Medicare Annual Wellness Visit  06/17/2023   Yearly kidney health urinalysis for diabetes  06/21/2023   Flu Shot  08/05/2023   DTaP/Tdap/Td vaccine (1 - Tdap) 10/11/2023*   Hemoglobin A1C  10/14/2023   Colon Cancer Screening  01/15/2024   Complete foot exam   04/13/2024   Yearly kidney function blood test for diabetes  06/23/2024   Eye exam for diabetics  07/05/2024   Pneumococcal Vaccine for age over 67  Completed   Hepatitis C Screening  Completed   Hepatitis B Vaccine  Aged Out   HPV Vaccine  Aged Out   Meningitis B Vaccine  Aged Out  *Topic was postponed. The date shown is not the original due date.    Advanced directives: (Declined) Advance directive discussed with you today. Even though you declined this today, please call our office should you change your mind, and we can give you the proper paperwork for you to  fill out. Advance Care Planning is important because it:  [x]  Makes sure you receive the medical care that is consistent with your values, goals, and preferences  [x]  It provides guidance to your family and loved ones and reduces their decisional burden about whether or not they are making the right decisions based on your wishes.  Follow the link provided in your after visit summary or read over the paperwork we have mailed to you to help you started getting your Advance Directives in place. If you need assistance in completing these, please reach out to us  so that we can help you!  See attachments for Preventive Care and Fall Prevention Tips.

## 2023-08-18 ENCOUNTER — Other Ambulatory Visit (HOSPITAL_COMMUNITY): Payer: Self-pay | Admitting: *Deleted

## 2023-08-19 ENCOUNTER — Encounter (HOSPITAL_COMMUNITY)
Admission: RE | Admit: 2023-08-19 | Discharge: 2023-08-19 | Disposition: A | Discharge status: Home / Self Care | Source: Ambulatory Visit | Attending: Internal Medicine | Admitting: Internal Medicine

## 2023-08-19 VITALS — BP 153/84 | Temp 97.4°F | Resp 16

## 2023-08-19 DIAGNOSIS — N184 Chronic kidney disease, stage 4 (severe): Secondary | ICD-10-CM | POA: Insufficient documentation

## 2023-08-19 DIAGNOSIS — N1832 Chronic kidney disease, stage 3b: Secondary | ICD-10-CM

## 2023-08-19 DIAGNOSIS — D631 Anemia in chronic kidney disease: Secondary | ICD-10-CM | POA: Insufficient documentation

## 2023-08-19 LAB — FERRITIN: Ferritin: 72 ng/mL (ref 24–336)

## 2023-08-19 LAB — IRON AND TIBC
Iron: 72 ug/dL (ref 45–182)
Saturation Ratios: 24 % (ref 17.9–39.5)
TIBC: 302 ug/dL (ref 250–450)
UIBC: 230 ug/dL

## 2023-08-19 LAB — RENAL FUNCTION PANEL
Albumin: 3.9 g/dL (ref 3.5–5.0)
Anion gap: 10 (ref 5–15)
BUN: 34 mg/dL — ABNORMAL HIGH (ref 8–23)
CO2: 19 mmol/L — ABNORMAL LOW (ref 22–32)
Calcium: 8.8 mg/dL — ABNORMAL LOW (ref 8.9–10.3)
Chloride: 105 mmol/L (ref 98–111)
Creatinine, Ser: 2.5 mg/dL — ABNORMAL HIGH (ref 0.61–1.24)
GFR, Estimated: 27 mL/min — ABNORMAL LOW (ref >60)
Glucose, Bld: 154 mg/dL — ABNORMAL HIGH (ref 70–99)
Phosphorus: 3.7 mg/dL (ref 2.5–4.6)
Potassium: 3.9 mmol/L (ref 3.5–5.1)
Sodium: 134 mmol/L — ABNORMAL LOW (ref 135–145)

## 2023-08-19 LAB — POCT HEMOGLOBIN-HEMACUE: Hemoglobin: 9.1 g/dL — ABNORMAL LOW (ref 13.0–17.0)

## 2023-08-19 MED ORDER — EPOETIN ALFA-EPBX 10000 UNIT/ML IJ SOLN
INTRAMUSCULAR | Status: AC
  Filled 2023-08-19: qty 2

## 2023-08-19 MED ORDER — EPOETIN ALFA-EPBX 10000 UNIT/ML IJ SOLN
20000.0000 [IU] | INTRAMUSCULAR | Status: DC
  Administered 2023-08-19: 20000 [IU] via SUBCUTANEOUS

## 2023-08-21 LAB — PTH, INTACT AND CALCIUM
Calcium, Total (PTH): 8.7 mg/dL (ref 8.6–10.2)
PTH: 51 pg/mL (ref 15–65)

## 2023-08-24 ENCOUNTER — Other Ambulatory Visit: Admitting: Pharmacist

## 2023-08-26 NOTE — Progress Notes (Signed)
 S:     Chief Complaint  Patient presents with   Medication Management    Hypertension and diabetes    Reason for visit: ?  Cameron Glover is a 73 y.o. male with a history of diabetes (type 2), who presents today for an initial diabetes and hypertension pharmacotherapy visit.? Pertinent PMH also includes HTN, HLD, T2DM, CKD stage 4, and anemia.   Known DM Complications: nephropathy and retinopathy   Care Team: Primary Care Provider: Leavy Mole, PA-C  At last visit, BP was elevated at 138/68 mmHg. Patient had been previously instructed to take hydralazine  50 mg 1.5 tablets TID, however, he reported difficulty will splitting the tablet.    Today, patient reports he has been able to split hydralazine  tablets without issue.  Current diabetes medications include: metformin  500 mg daily, Farxiga  10 mg daily Previous diabetes medications include: glipizide  Current hypertension medications include: lisinopril /hydrochlorothiazide  20/12.5 mg 2 tablets daily, amlodipine  10 mg daily, hydralazine  75 mg TID Current hyperlipidemia medications include: atorvastatin  40 mg daily  Patient reports adherence to taking all medications as prescribed.   Have you been experiencing any side effects to the medications prescribed? no Do you have any problems obtaining medications due to transportation or finances? No - receives Farxiga  through PAP Insurance coverage: [employer]  Patient denies hypoglycemic events.  Reported home fasting blood sugars: not checking at home  Patient denies neuropathy (nerve pain). Patient denies visual changes.  Patient reported dietary habits: Eats 2-3 meals/day Breakfast: cereal, eggs and bacon Lunch: chicken and beans Dinner: ham sandwich Snacks: cookie, ice cream Drinks: water, soda (infrequently)  Patient-reported exercise habits: walks 10-15 minutes a day at the park  Hypertension: Patient has a validated, automated, upper arm home BP  cuff Current blood pressure readings readings: just received BP cuff - not checking yet  Patient denies hypotensive s/sx including dizziness, lightheadedness.  Patient denies hypertensive symptoms including headache, chest pain, shortness of breath  GERD: At previous visit with PCP, patient was instructed to switch from pantoprazole  to famotidine . Patient has since run out of famotidine  therapy, however, reports that GERD symptoms remained stable with the switch.   DM Prevention:  Statin: Taking; high intensity.?  History of chronic kidney disease? yes History of albuminuria? yes, last UACR on 06/21/22 = 613 mg/g Last eye exam: 07/06/23; Retinopathy Present Lab Results  Component Value Date   HMDIABEYEEXA Retinopathy (A) 07/06/2023   Last foot exam: 04/14/2023 Tobacco Use:   Tobacco Use: Low Risk  (08/05/2023)   Patient History    Smoking Tobacco Use: Never    Smokeless Tobacco Use: Never    Passive Exposure: Never   O:   Vitals:  Wt Readings from Last 3 Encounters:  08/05/23 181 lb (82.1 kg)  07/14/23 189 lb (85.7 kg)  07/12/23 181 lb (82.1 kg)   BP Readings from Last 3 Encounters:  08/29/23 123/78  08/19/23 (!) 153/84  08/05/23 (!) 154/87   Pulse Readings from Last 3 Encounters:  08/29/23 93  07/22/23 (!) 112  07/14/23 91     Labs:?  Lab Results  Component Value Date   HGBA1C 5.9 (A) 04/14/2023   HGBA1C 6.6 (A) 01/13/2023   HGBA1C 7.1 (H) 10/11/2022   GLUCOSE 154 (H) 08/19/2023   MICRALBCREAT 613 (H) 06/21/2022   MICRALBCREAT 945 (H) 03/04/2022   CREATININE 2.50 (H) 08/19/2023   CREATININE 2.88 (H) 06/24/2023   CREATININE 2.04 (H) 10/11/2022    Lab Results  Component Value Date   CHOL 98  10/11/2022   LDLCALC 41 10/11/2022   LDLCALC 44 06/21/2022   LDLCALC 34 08/17/2021   HDL 35 (L) 10/11/2022   TRIG 137 10/11/2022   TRIG 197 (H) 06/21/2022   TRIG 91 08/17/2021   ALT 11 10/11/2022   ALT 18 03/04/2022   AST 12 10/11/2022   AST 13 03/04/2022       Chemistry      Component Value Date/Time   NA 134 (L) 08/19/2023 0949   NA 139 05/21/2022 0000   K 3.9 08/19/2023 0949   CL 105 08/19/2023 0949   CO2 19 (L) 08/19/2023 0949   BUN 34 (H) 08/19/2023 0949   BUN 45 (A) 05/21/2022 0000   CREATININE 2.50 (H) 08/19/2023 0949   CREATININE 2.04 (H) 10/11/2022 1140   GLU 187 05/21/2022 0000      Component Value Date/Time   CALCIUM  8.8 (L) 08/19/2023 0949   CALCIUM  8.7 08/19/2023 0949   ALKPHOS 77 09/02/2017 1738   AST 12 10/11/2022 1140   ALT 11 10/11/2022 1140   BILITOT 0.3 10/11/2022 1140       The ASCVD Risk score (Arnett DK, et al., 2019) failed to calculate for the following reasons:   The valid total cholesterol range is 130 to 320 mg/dL  Lab Results  Component Value Date   MICRALBCREAT 613 (H) 06/21/2022   MICRALBCREAT 945 (H) 03/04/2022    A/P: Diabetes currently controlled with a most recent A1c of 5.9% on 04/14/23. Patient is able to verbalize appropriate hypoglycemia management plan. Medication adherence appears appropriate. Given current CKD dx with most recent eGFR of 27, will discontinued metformin  therapy as not recommended with eGFR <30.  -Continued SGLT2-I Farxiga  (dapagliflozin) 10 mg daily -Discontinued metformin .  -Extensively discussed pathophysiology of diabetes, recommended lifestyle interventions, dietary effects on blood sugar control.  -Counseled on s/sx of and management of hypoglycemia.  -Counseled to begin monitoring BG at home 2-3x per week  ASCVD risk - primary prevention in patient with diabetes. Last LDL is 41 mg/dL, at goal of <29 mg/dL.indicated.  -Continued atorvastatin  40 mg daily.   Hypertension longstanding currently controlled. Blood pressure goal of <130/80 mmHg. Medication adherence appropriate.  -Continued amlodipine  10 mg daily. -Continued lisinopril /hydrochlorothiazide  25/12.5 mg 2 tablets daily. -Continued hydralazine  50 mg 1.5 tablets TID. -Begin monitoring BP at home 2-3x per week.  Counseled on appropriate monitoring techniques.   GERD: Symptoms controlled on monotherapy famotidine  per patient report, however, he has ran out of this therapy. Will discontinue pantoprazole  at this time. -Discontinued pantoprazole  -Restart famotidine  20 mg daily  Written patient instructions provided. Patient verbalized understanding of treatment plan.  Total time in face to face counseling 30 minutes.     Follow-up:  Pharmacist on 10/04/23 PCP clinic visit on 10/17/23  Peyton CHARLENA Ferries, PharmD Clinical Pharmacist [facility] [phone]

## 2023-08-29 ENCOUNTER — Ambulatory Visit (INDEPENDENT_AMBULATORY_CARE_PROVIDER_SITE_OTHER)

## 2023-08-29 VITALS — BP 123/78 | HR 93

## 2023-08-29 DIAGNOSIS — R809 Proteinuria, unspecified: Secondary | ICD-10-CM

## 2023-08-29 DIAGNOSIS — K219 Gastro-esophageal reflux disease without esophagitis: Secondary | ICD-10-CM | POA: Diagnosis not present

## 2023-08-29 DIAGNOSIS — N184 Chronic kidney disease, stage 4 (severe): Secondary | ICD-10-CM

## 2023-08-29 DIAGNOSIS — Z7984 Long term (current) use of oral hypoglycemic drugs: Secondary | ICD-10-CM | POA: Diagnosis not present

## 2023-08-29 DIAGNOSIS — E1129 Type 2 diabetes mellitus with other diabetic kidney complication: Secondary | ICD-10-CM | POA: Diagnosis not present

## 2023-08-29 DIAGNOSIS — I10 Essential (primary) hypertension: Secondary | ICD-10-CM

## 2023-08-29 MED ORDER — FAMOTIDINE 20 MG PO TABS: 20.0000 mg | ORAL_TABLET | Freq: Every day | ORAL | 1 refills | Status: DC

## 2023-08-29 NOTE — Patient Instructions (Signed)
 Thank you for coming to see us  today.   Stop metformin  Stop pantoprazole  Restart famotidine  20 mg daily   Blood pressure today is well-controlled.  Limiting salt and caffeine, as well as exercising as able for at least 30 minutes for 5 days out of the week, can also help you lower your blood pressure.  Take your blood pressure at home if you are able. Please write down these numbers and bring them to your visits.  Mister Krahenbuhl

## 2023-08-29 NOTE — Progress Notes (Signed)
 I have reviewed this encounter including the documentation in this note and/or discussed this patient with the provider, Peyton Ferries,  RPH, I am certifying that I agree with the content of this note.  Michelene Cower, PA-C [facility] 08/29/23  4:36 PM

## 2023-09-01 ENCOUNTER — Other Ambulatory Visit (HOSPITAL_COMMUNITY): Payer: Self-pay

## 2023-09-02 ENCOUNTER — Ambulatory Visit (HOSPITAL_COMMUNITY)
Admission: RE | Admit: 2023-09-02 | Discharge: 2023-09-02 | Disposition: A | Discharge status: Home / Self Care | Source: Ambulatory Visit | Attending: Internal Medicine | Admitting: Internal Medicine

## 2023-09-02 DIAGNOSIS — D631 Anemia in chronic kidney disease: Secondary | ICD-10-CM | POA: Diagnosis not present

## 2023-09-02 DIAGNOSIS — N189 Chronic kidney disease, unspecified: Secondary | ICD-10-CM | POA: Diagnosis not present

## 2023-09-02 MED ORDER — SODIUM CHLORIDE 0.9 % IV SOLN
510.0000 mg | Freq: Once | INTRAVENOUS | Status: AC
  Administered 2023-09-02: 510 mg via INTRAVENOUS
  Filled 2023-09-02: qty 510

## 2023-09-13 DIAGNOSIS — D631 Anemia in chronic kidney disease: Secondary | ICD-10-CM | POA: Insufficient documentation

## 2023-09-16 ENCOUNTER — Encounter (HOSPITAL_COMMUNITY)
Admission: RE | Admit: 2023-09-16 | Discharge: 2023-09-16 | Disposition: A | Discharge status: Home / Self Care | Source: Ambulatory Visit | Attending: Internal Medicine | Admitting: Internal Medicine

## 2023-09-16 VITALS — BP 141/75 | HR 63 | Temp 98.0°F | Resp 16

## 2023-09-16 DIAGNOSIS — N183 Chronic kidney disease, stage 3 unspecified: Secondary | ICD-10-CM | POA: Diagnosis not present

## 2023-09-16 DIAGNOSIS — D631 Anemia in chronic kidney disease: Secondary | ICD-10-CM | POA: Diagnosis not present

## 2023-09-16 LAB — IRON AND TIBC
Iron: 75 ug/dL (ref 45–182)
Saturation Ratios: 30 % (ref 17.9–39.5)
TIBC: 248 ug/dL — ABNORMAL LOW (ref 250–450)
UIBC: 173 ug/dL

## 2023-09-16 LAB — POCT HEMOGLOBIN-HEMACUE: Hemoglobin: 8.8 g/dL — ABNORMAL LOW (ref 13.0–17.0)

## 2023-09-16 LAB — FERRITIN: Ferritin: 280 ng/mL (ref 24–336)

## 2023-09-16 MED ORDER — EPOETIN ALFA-EPBX 20000 UNIT/ML IJ SOLN
INTRAMUSCULAR | Status: AC
  Filled 2023-09-16: qty 1

## 2023-09-16 MED ORDER — EPOETIN ALFA-EPBX 20000 UNIT/ML IJ SOLN
20000.0000 [IU] | Freq: Once | INTRAMUSCULAR | Status: AC
  Administered 2023-09-16: 20000 [IU] via SUBCUTANEOUS

## 2023-10-04 ENCOUNTER — Ambulatory Visit

## 2023-10-05 ENCOUNTER — Telehealth: Payer: Self-pay

## 2023-10-05 NOTE — Telephone Encounter (Signed)
 Gave pt a call to let him know he will be receiving a PAP AZ&ME(Farxiga ),spoke with pt he is aware,faxed provider portion today.

## 2023-10-06 NOTE — Telephone Encounter (Signed)
 Received provider portion back today on farxiga 

## 2023-10-10 ENCOUNTER — Ambulatory Visit (INDEPENDENT_AMBULATORY_CARE_PROVIDER_SITE_OTHER)

## 2023-10-10 VITALS — BP 132/80 | HR 97

## 2023-10-10 DIAGNOSIS — I10 Essential (primary) hypertension: Secondary | ICD-10-CM

## 2023-10-10 DIAGNOSIS — N184 Chronic kidney disease, stage 4 (severe): Secondary | ICD-10-CM

## 2023-10-10 MED ORDER — OLMESARTAN MEDOXOMIL-HCTZ 40-25 MG PO TABS: 1.0000 | ORAL_TABLET | Freq: Every day | ORAL | 1 refills | Status: DC

## 2023-10-10 NOTE — Patient Instructions (Signed)
 Thanks for visiting with me today!  STOP lisinopril /hydrochlorothiazide   Start olmesartan/hydrochlorothiazide  40/25 mg ONE tablet daily

## 2023-10-10 NOTE — Progress Notes (Signed)
 S:     Chief Complaint  Patient presents with   Medication Management    Reason for visit: ?  Cameron Glover is a 73 y.o. male with a history of diabetes (type 2), who presents today for an initial diabetes and hypertension pharmacotherapy visit.? Pertinent PMH also includes HTN, HLD, T2DM, CKD stage 4, and anemia.   Known DM Complications: nephropathy and retinopathy   Care Team: Primary Care Provider: Leavy Mole, PA-C  At last visit, BP was elevated at 138/68 mmHg. Patient had been previously instructed to take hydralazine  50 mg 1.5 tablets TID, however, he reported difficulty will splitting the tablet.    Today, patient reports he has been able to split hydralazine  tablets without issue.  Current diabetes medications include: Farxiga  10 mg daily Previous diabetes medications include: glipizide , metformin  (CKD) Current hypertension medications include: lisinopril /hydrochlorothiazide  20/12.5 mg 2 tablets daily, amlodipine  10 mg daily, hydralazine  75 mg TID Current hyperlipidemia medications include: atorvastatin  40 mg daily  Current medication access support: Farxiga  via AZ[employer]Me  Patient reports adherence to taking all medications as prescribed.   Have you been experiencing any side effects to the medications prescribed? no Do you have any problems obtaining medications due to transportation or finances? No - receives Farxiga  through PAP Insurance coverage: [employer]  Patient denies hypoglycemic events.  Reported home fasting blood sugars: 99-140  Patient denies neuropathy (nerve pain). Patient denies visual changes.  Patient reported dietary habits: Eats 2-3 meals/day  Breakfast: cereal, eggs and bacon Lunch: chicken and beans Dinner: ham sandwich Snacks: cookie, ice cream Drinks: water, soda (infrequently)  Patient-reported exercise habits: walks 10-15 minutes a day at the park  Hypertension: Patient has a validated, automated, upper arm home BP  cuff Current blood pressure readings readings: average 146/86  Patient reports some hypotensive s/sx including dizziness, lightheadedness with positional changes  Patient denies hypertensive symptoms including chest pain, shortness of breath Patient reports headaches infrequently  DM Prevention:  Statin: Taking; high intensity.?  History of chronic kidney disease? yes History of albuminuria? yes, last UACR on 06/21/22 = 613 mg/g Last eye exam: 07/06/23; Retinopathy Present Lab Results  Component Value Date   HMDIABEYEEXA Retinopathy (A) 07/06/2023   Last foot exam: 04/14/2023 Tobacco Use:   Tobacco Use: Low Risk  (08/05/2023)   Patient History    Smoking Tobacco Use: Never    Smokeless Tobacco Use: Never    Passive Exposure: Never   O:   Vitals:  Wt Readings from Last 3 Encounters:  08/05/23 181 lb (82.1 kg)  07/14/23 189 lb (85.7 kg)  07/12/23 181 lb (82.1 kg)   BP Readings from Last 3 Encounters:  10/10/23 132/80  09/16/23 (!) 141/75  09/02/23 (!) 137/93   Pulse Readings from Last 3 Encounters:  10/10/23 97  09/16/23 63  09/02/23 86     Labs:?  Lab Results  Component Value Date   HGBA1C 5.9 (A) 04/14/2023   HGBA1C 6.6 (A) 01/13/2023   HGBA1C 7.1 (H) 10/11/2022   GLUCOSE 154 (H) 08/19/2023   MICRALBCREAT 613 (H) 06/21/2022   MICRALBCREAT 945 (H) 03/04/2022   CREATININE 2.50 (H) 08/19/2023   CREATININE 2.88 (H) 06/24/2023   CREATININE 2.04 (H) 10/11/2022    Lab Results  Component Value Date   CHOL 98 10/11/2022   LDLCALC 41 10/11/2022   LDLCALC 44 06/21/2022   LDLCALC 34 08/17/2021   HDL 35 (L) 10/11/2022   TRIG 137 10/11/2022   TRIG 197 (H) 06/21/2022   TRIG 91  08/17/2021   ALT 11 10/11/2022   ALT 18 03/04/2022   AST 12 10/11/2022   AST 13 03/04/2022      Chemistry      Component Value Date/Time   NA 134 (L) 08/19/2023 0949   NA 139 05/21/2022 0000   K 3.9 08/19/2023 0949   CL 105 08/19/2023 0949   CO2 19 (L) 08/19/2023 0949   BUN 34 (H)  08/19/2023 0949   BUN 45 (A) 05/21/2022 0000   CREATININE 2.50 (H) 08/19/2023 0949   CREATININE 2.04 (H) 10/11/2022 1140   GLU 187 05/21/2022 0000      Component Value Date/Time   CALCIUM  8.8 (L) 08/19/2023 0949   CALCIUM  8.7 08/19/2023 0949   ALKPHOS 77 09/02/2017 1738   AST 12 10/11/2022 1140   ALT 11 10/11/2022 1140   BILITOT 0.3 10/11/2022 1140       The ASCVD Risk score (Arnett DK, et al., 2019) failed to calculate for the following reasons:   The valid total cholesterol range is 130 to 320 mg/dL  Lab Results  Component Value Date   MICRALBCREAT 613 (H) 06/21/2022   MICRALBCREAT 945 (H) 03/04/2022    A/P: Hypertension longstanding currently close to goal, but elevated at home with an average of 146/86. Blood pressure goal of <130/80 mmHg. Medication adherence appropriate. Patient is interested in reducing pill burden. Given CKD dx, could be beneficial to switch to from a thiazide-type to a thiazide-like diuretic. Will switch to a RAAS agent that comes as a combination product with amlodipine  to adjust to at a future visit. Will make small adjustments given concern for confusion with multiple changes at once. Will discuss possible switching from hydrochlorothiazide  to chlorthalidone at follow up.  -Continued amlodipine  10 mg daily. -Discontinued lisinopril /hydrochlorothiazide  25/12.5 mg 2 tablets daily. -Started olmesartan/hydrochlorothiazide  40/25 mg daily  -Continued hydralazine  50 mg 1.5 tablets TID. -Continue monitoring BP at home 2-3x per week.   Diabetes currently controlled with a most recent A1c of 5.9% on 04/14/23. Patient is able to verbalize appropriate hypoglycemia management plan. Medication adherence appears appropriate.  -Continued SGLT2-I Farxiga  (dapagliflozin) 10 mg daily -Extensively discussed pathophysiology of diabetes, recommended lifestyle interventions, dietary effects on blood sugar control.  -Counseled on s/sx of and management of hypoglycemia.   -Counseled to continue monitoring BG at home 2-3x per week  ASCVD risk - primary prevention in patient with diabetes. Last LDL is 41 mg/dL, at goal of <29 mg/dL.indicated.  -Continued atorvastatin  40 mg daily.   Written patient instructions provided. Patient verbalized understanding of treatment plan.  Total time in face to face counseling 20 minutes.     Follow-up:  Pharmacist on 11/07/23  Peyton CHARLENA Ferries, PharmD Clinical Pharmacist [facility] [phone]

## 2023-10-12 DIAGNOSIS — E113312 Type 2 diabetes mellitus with moderate nonproliferative diabetic retinopathy with macular edema, left eye: Secondary | ICD-10-CM | POA: Diagnosis not present

## 2023-10-12 DIAGNOSIS — H43821 Vitreomacular adhesion, right eye: Secondary | ICD-10-CM | POA: Diagnosis not present

## 2023-10-12 DIAGNOSIS — H35033 Hypertensive retinopathy, bilateral: Secondary | ICD-10-CM | POA: Diagnosis not present

## 2023-10-14 ENCOUNTER — Encounter: Payer: Self-pay | Admitting: Urology

## 2023-10-14 ENCOUNTER — Ambulatory Visit (HOSPITAL_COMMUNITY)
Admission: RE | Admit: 2023-10-14 | Discharge: 2023-10-14 | Disposition: A | Discharge status: Home / Self Care | Source: Ambulatory Visit | Attending: Internal Medicine | Admitting: Internal Medicine

## 2023-10-14 VITALS — BP 148/87 | HR 102 | Temp 97.2°F | Resp 16

## 2023-10-14 DIAGNOSIS — D631 Anemia in chronic kidney disease: Secondary | ICD-10-CM | POA: Insufficient documentation

## 2023-10-14 DIAGNOSIS — N183 Chronic kidney disease, stage 3 unspecified: Secondary | ICD-10-CM | POA: Insufficient documentation

## 2023-10-14 LAB — IRON AND TIBC
Iron: 67 ug/dL (ref 45–182)
Saturation Ratios: 27 % (ref 17.9–39.5)
TIBC: 248 ug/dL — ABNORMAL LOW (ref 250–450)
UIBC: 181 ug/dL

## 2023-10-14 LAB — RENAL FUNCTION PANEL
Albumin: 4 g/dL (ref 3.5–5.0)
Anion gap: 17 — ABNORMAL HIGH (ref 5–15)
BUN: 21 mg/dL (ref 8–23)
CO2: 20 mmol/L — ABNORMAL LOW (ref 22–32)
Calcium: 6.1 mg/dL — CL (ref 8.9–10.3)
Chloride: 102 mmol/L (ref 98–111)
Creatinine, Ser: 2.08 mg/dL — ABNORMAL HIGH (ref 0.61–1.24)
GFR, Estimated: 34 mL/min — ABNORMAL LOW (ref >60)
Glucose, Bld: 138 mg/dL — ABNORMAL HIGH (ref 70–99)
Phosphorus: 3.8 mg/dL (ref 2.5–4.6)
Potassium: 3.7 mmol/L (ref 3.5–5.1)
Sodium: 139 mmol/L (ref 135–145)

## 2023-10-14 LAB — FERRITIN: Ferritin: 210 ng/mL (ref 24–336)

## 2023-10-14 LAB — POCT HEMOGLOBIN-HEMACUE: Hemoglobin: 9.8 g/dL — ABNORMAL LOW (ref 13.0–17.0)

## 2023-10-14 MED ORDER — EPOETIN ALFA-EPBX 40000 UNIT/ML IJ SOLN
INTRAMUSCULAR | Status: AC
  Filled 2023-10-14: qty 1

## 2023-10-14 MED ORDER — EPOETIN ALFA-EPBX 40000 UNIT/ML IJ SOLN
40000.0000 [IU] | Freq: Once | INTRAMUSCULAR | Status: AC
  Administered 2023-10-14: 40000 [IU] via SUBCUTANEOUS

## 2023-10-17 ENCOUNTER — Ambulatory Visit: Admitting: Family Medicine

## 2023-10-17 ENCOUNTER — Telehealth: Payer: Self-pay

## 2023-10-17 LAB — PTH, INTACT AND CALCIUM
Calcium, Total (PTH): 6.2 mg/dL — CL (ref 8.6–10.2)
PTH: 48 pg/mL (ref 15–65)

## 2023-10-17 NOTE — Telephone Encounter (Signed)
 Critical lab pt has since been discharged from hospital and has to notify PCP   Calcium  total 6.2

## 2023-10-17 NOTE — Telephone Encounter (Signed)
 He had one that same day?

## 2023-10-19 ENCOUNTER — Other Ambulatory Visit: Payer: Self-pay

## 2023-10-20 NOTE — Telephone Encounter (Signed)
 Pt notified, will get labs today

## 2023-10-21 ENCOUNTER — Ambulatory Visit: Payer: Self-pay | Admitting: Family Medicine

## 2023-10-21 LAB — PTH, INTACT AND CALCIUM
Calcium: 7.4 mg/dL — ABNORMAL LOW (ref 8.6–10.3)
PTH: 60 pg/mL (ref 16–77)

## 2023-10-24 DIAGNOSIS — D649 Anemia, unspecified: Secondary | ICD-10-CM | POA: Diagnosis not present

## 2023-10-24 DIAGNOSIS — N3289 Other specified disorders of bladder: Secondary | ICD-10-CM | POA: Diagnosis not present

## 2023-10-24 DIAGNOSIS — E1129 Type 2 diabetes mellitus with other diabetic kidney complication: Secondary | ICD-10-CM | POA: Diagnosis not present

## 2023-10-24 DIAGNOSIS — N183 Chronic kidney disease, stage 3 unspecified: Secondary | ICD-10-CM | POA: Diagnosis not present

## 2023-10-24 DIAGNOSIS — E1122 Type 2 diabetes mellitus with diabetic chronic kidney disease: Secondary | ICD-10-CM | POA: Diagnosis not present

## 2023-10-24 DIAGNOSIS — I129 Hypertensive chronic kidney disease with stage 1 through stage 4 chronic kidney disease, or unspecified chronic kidney disease: Secondary | ICD-10-CM | POA: Diagnosis not present

## 2023-11-02 ENCOUNTER — Other Ambulatory Visit: Payer: Self-pay | Admitting: Family Medicine

## 2023-11-02 DIAGNOSIS — I10 Essential (primary) hypertension: Secondary | ICD-10-CM

## 2023-11-02 DIAGNOSIS — N184 Chronic kidney disease, stage 4 (severe): Secondary | ICD-10-CM

## 2023-11-04 ENCOUNTER — Other Ambulatory Visit: Payer: Self-pay | Admitting: Family Medicine

## 2023-11-04 DIAGNOSIS — E1129 Type 2 diabetes mellitus with other diabetic kidney complication: Secondary | ICD-10-CM

## 2023-11-05 NOTE — Telephone Encounter (Signed)
 Discontinued on 08/29/23 by provider, will refuse this request.  Requested Prescriptions  Pending Prescriptions Disp Refills   metFORMIN  (GLUCOPHAGE ) 500 MG tablet [Pharmacy Med Name: METFORMIN  HCL 500 MG TABLET] 90 tablet 1    Sig: TAKE 1 TABLET BY MOUTH EVERY DAY WITH BREAKFAST     Endocrinology:  Diabetes - Biguanides Failed - 11/05/2023  8:37 AM      Failed - Cr in normal range and within 360 days    Creat  Date Value Ref Range Status  10/11/2022 2.04 (H) 0.70 - 1.35 mg/dL Final   Creatinine, Ser  Date Value Ref Range Status  10/14/2023 2.08 (H) 0.61 - 1.24 mg/dL Final   Creatinine, Urine  Date Value Ref Range Status  06/21/2022 68 20 - 320 mg/dL Final         Failed - HBA1C is between 0 and 7.9 and within 180 days    Hemoglobin A1C  Date Value Ref Range Status  04/14/2023 5.9 (A) 4.0 - 5.6 % Final  03/12/2022 7.8  Final    Comment:    House call   Hgb A1c MFr Bld  Date Value Ref Range Status  10/11/2022 7.1 (H) <5.7 % of total Hgb Final    Comment:    For someone without known diabetes, a hemoglobin A1c value of 6.5% or greater indicates that they may have  diabetes and this should be confirmed with a follow-up  test. . For someone with known diabetes, a value <7% indicates  that their diabetes is well controlled and a value  greater than or equal to 7% indicates suboptimal  control. A1c targets should be individualized based on  duration of diabetes, age, comorbid conditions, and  other considerations. . Currently, no consensus exists regarding use of hemoglobin A1c for diagnosis of diabetes for children. .          Failed - eGFR in normal range and within 360 days    GFR, Est African American  Date Value Ref Range Status  02/11/2020 58 (L) > OR = 60 mL/min/1.27m2 Final   GFR, Est Non African American  Date Value Ref Range Status  02/11/2020 50 (L) > OR = 60 mL/min/1.71m2 Final   GFR, Estimated  Date Value Ref Range Status  10/14/2023 34 (L) >60  mL/min Final    Comment:    (NOTE) Calculated using the CKD-EPI Creatinine Equation (2021)    eGFR  Date Value Ref Range Status  10/11/2022 35 (L) > OR = 60 mL/min/1.37m2 Final         Failed - B12 Level in normal range and within 720 days    Vitamin B-12  Date Value Ref Range Status  08/11/2020 250 200 - 1,100 pg/mL Final    Comment:    . Please Note: Although the reference range for vitamin B12 is [id number] pg/mL, it has been reported that between 5 and 10% of patients with values between 200 and 400 pg/mL may experience neuropsychiatric and hematologic abnormalities due to occult B12 deficiency; less than 1% of patients with values above 400 pg/mL will have symptoms. .          Failed - CBC within normal limits and completed in the last 12 months    WBC  Date Value Ref Range Status  10/11/2022 4.0 3.8 - 10.8 Thousand/uL Final   RBC  Date Value Ref Range Status  10/11/2022 2.95 (L) 4.20 - 5.80 Million/uL Final   Hemoglobin  Date Value Ref Range Status  10/14/2023 9.8 (L) 13.0 - 17.0 g/dL Final   HCT  Date Value Ref Range Status  10/11/2022 28.1 (L) 38.5 - 50.0 % Final   MCHC  Date Value Ref Range Status  10/11/2022 32.7 32.0 - 36.0 g/dL Final    Comment:    For adults, a slight decrease in the calculated MCHC value (in the range of 30 to 32 g/dL) is most likely not clinically significant; however, it should be interpreted with caution in correlation with other red cell parameters and the patient's clinical condition.    MCH  Date Value Ref Range Status  10/11/2022 31.2 27.0 - 33.0 pg Final   MCV  Date Value Ref Range Status  10/11/2022 95.3 80.0 - 100.0 fL Final   No results found for: PLTCOUNTKUC, LABPLAT, POCPLA RDW  Date Value Ref Range Status  10/11/2022 12.3 11.0 - 15.0 % Final         Passed - Valid encounter within last 6 months    Recent Outpatient Visits           2 months ago Essential hypertension   [facility] Peyton BRAVO, [facility]   3 months ago Controlled type 2 diabetes mellitus with microalbuminuria, without long-term current use of insulin  [facility])   [facility] Leavy Mole, PA-C   6 months ago Essential hypertension   [facility] Leavy Mole, PA-C       Future Appointments             In 1 month McGowan, Clotilda DELENA RIGGERS [facility] [place]

## 2023-11-07 ENCOUNTER — Ambulatory Visit (INDEPENDENT_AMBULATORY_CARE_PROVIDER_SITE_OTHER)

## 2023-11-07 ENCOUNTER — Ambulatory Visit: Admitting: Internal Medicine

## 2023-11-07 ENCOUNTER — Other Ambulatory Visit: Payer: Self-pay

## 2023-11-07 ENCOUNTER — Encounter: Payer: Self-pay | Admitting: Internal Medicine

## 2023-11-07 VITALS — BP 138/84 | Temp 98.1°F | Resp 16 | Ht 60.0 in | Wt 181.9 lb

## 2023-11-07 DIAGNOSIS — E1122 Type 2 diabetes mellitus with diabetic chronic kidney disease: Secondary | ICD-10-CM | POA: Diagnosis not present

## 2023-11-07 DIAGNOSIS — I10 Essential (primary) hypertension: Secondary | ICD-10-CM | POA: Diagnosis not present

## 2023-11-07 DIAGNOSIS — N184 Chronic kidney disease, stage 4 (severe): Secondary | ICD-10-CM

## 2023-11-07 DIAGNOSIS — Z23 Encounter for immunization: Secondary | ICD-10-CM | POA: Diagnosis not present

## 2023-11-07 DIAGNOSIS — R809 Proteinuria, unspecified: Secondary | ICD-10-CM

## 2023-11-07 DIAGNOSIS — E1129 Type 2 diabetes mellitus with other diabetic kidney complication: Secondary | ICD-10-CM | POA: Diagnosis not present

## 2023-11-07 DIAGNOSIS — N183 Chronic kidney disease, stage 3 unspecified: Secondary | ICD-10-CM

## 2023-11-07 DIAGNOSIS — E1169 Type 2 diabetes mellitus with other specified complication: Secondary | ICD-10-CM

## 2023-11-07 DIAGNOSIS — Z7984 Long term (current) use of oral hypoglycemic drugs: Secondary | ICD-10-CM

## 2023-11-07 DIAGNOSIS — E785 Hyperlipidemia, unspecified: Secondary | ICD-10-CM

## 2023-11-07 DIAGNOSIS — Z8546 Personal history of malignant neoplasm of prostate: Secondary | ICD-10-CM

## 2023-11-07 DIAGNOSIS — D631 Anemia in chronic kidney disease: Secondary | ICD-10-CM

## 2023-11-07 MED ORDER — AMLODIPINE-OLMESARTAN 10-40 MG PO TABS: 1.0000 | ORAL_TABLET | Freq: Every day | ORAL | 3 refills | Status: AC

## 2023-11-07 MED ORDER — CHLORTHALIDONE 25 MG PO TABS: 25.0000 mg | ORAL_TABLET | Freq: Every day | ORAL | 1 refills | Status: AC

## 2023-11-07 MED ORDER — FARXIGA 10 MG PO TABS: 10.0000 mg | ORAL_TABLET | Freq: Every day | ORAL | 1 refills | Status: AC

## 2023-11-07 NOTE — Progress Notes (Signed)
 Established Patient Office Visit  Subjective   Patient ID: Cameron Glover, male    DOB: Dec 28, 1950  Age: 73 y.o. MRN: [id number]  Chief Complaint  Patient presents with   Medical Management of Chronic Issues    HPI  Patient is here for follow up on chronic medical conditions. This is my first time meeting him.   Discussed the use of AI scribe software for clinical note transcription with the patient, who gave verbal consent to proceed.  History of Present Illness Cameron Glover is a 73 year old male with hypertension and chronic kidney disease who presents for medication review and management.  He is currently taking olmesartan with hydrochlorothiazide  40/25 mg for hypertension, switched from a previous medication last week. He is also on two other unspecified antihypertensive medications. His blood pressure today is 138/84 mmHg.  He is taking Farxiga  for kidney function, which he believes is also for diabetes management. He recently consulted a nephrologist and is on calcium  supplements, unable to view these notes.   He has a history of prostate cancer and is under regular urological follow-up, with the last check-up earlier this summer showing stability. He is scheduled for follow-ups every six months.  He takes calcium  supplements due to low calcium  levels noted in recent labs. He also takes a cholesterol medication, Pepcid  as needed for stomach issues, and vitamins.  He has received the pneumonia and COVID vaccines and typically receives the flu vaccine annually, although he has not yet received it this year.   Hypertension: -Medications: Olmesartan-hydrochlorothiazide  40-25 mg, Amlodipine  10 mg, Hydralazine  75 mg TID - working with the pharmacist to figure out best medications for him -Patient is compliant with above medications and reports no side effects. -Denies any SOB, CP, vision changes, LE edema or symptoms of hypotension  HLD: -Medications: Lipitor 40 mg -Patient is  compliant with above medications and reports no side effects.  -Last lipid panel: Lipid Panel     Component Value Date/Time   CHOL 98 10/11/2022 1140   TRIG 137 10/11/2022 1140   HDL 35 (L) 10/11/2022 1140   CHOLHDL 2.8 10/11/2022 1140   LDLCALC 41 10/11/2022 1140   Diabetes, Type 2: -Last A1c 5.9% 4/25 -Medications: Farxiga  10 mg -Patient is compliant with the above medications and reports no side effects.  -Eye exam: UTD -Foot exam: UTD -Microalbumin: Due -Statin: yes -PNA vaccine: UTD -Denies symptoms of hypoglycemia, polyuria, polydipsia, numbness extremities, foot ulcers/trauma.   GERD: -Currently on Pepcid  20 mg  Hypocalcemia/CKD3B: -Calcium  on labs in October 6.1, PTH normal.  -Creatinine 2.08, GFR 34 -Currently on Farxiga  10 mg. -Was seen by Nephrology, obtaining records  History of Prostate Cancer:  -Following with Urology -PSA 7/25 <0.1  Health Maintenance: -Blood work due  Patient Active Problem List   Diagnosis Date Noted   Anemia due to stage 3 chronic kidney disease treated with erythropoietin (HCC) 09/13/2023   Prostate cancer (HCC) 01/13/2023   Stage 3b chronic kidney disease (HCC) 03/16/2022   Class 2 severe obesity with serious comorbidity and body mass index (BMI) of 36.0 to 36.9 in adult 03/16/2022   History of prostate cancer 08/17/2021   Grade 3 hypertensive retinopathy, bilateral 04/29/2021   Uncontrolled diabetes mellitus with hyperglycemia, without long-term current use of insulin  (HCC) 03/30/2021   Snores 11/17/2020   Pseudophakia of left eye 10/13/2020   Anemia 08/11/2020   Gastroesophageal reflux disease 08/11/2020   Vitamin B12 deficiency 08/11/2020   Vitamin D  deficiency 08/11/2020   Elevated parathyroid  hormone 08/11/2020   Secondary hyperparathyroidism 08/11/2020   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   Severe nonproliferative diabetic retinopathy of left eye, with macular edema, associated with type 2 diabetes mellitus  (HCC) 05/14/2019   Severe nonproliferative diabetic retinopathy of right eye, with macular edema, associated with type 2 diabetes mellitus (HCC) 05/14/2019   Retinal hemorrhage of right eye 05/14/2019   Retinal hemorrhage of left eye 05/14/2019   Nuclear sclerotic cataract of right eye 05/14/2019   LVH (left ventricular hypertrophy) 03/23/2019   Hyperlipidemia associated with type 2 diabetes mellitus (HCC) 09/22/2018   CKD stage 3 due to type 2 diabetes mellitus (HCC) 09/22/2018   Essential hypertension 04/15/2018   Anemia due to stage 4 chronic kidney disease (HCC) 04/15/2018   Controlled type 2 diabetes mellitus with microalbuminuria, without long-term current use of insulin  (HCC) 09/02/2017   Past Medical History:  Diagnosis Date   Acute renal failure (ARF)    AKI (acute kidney injury) 09/02/2017   Anemia due to chronic kidney disease 04/15/2018   DM (diabetes mellitus) with complications (HCC) 09/02/2017   Elevated lipase 04/15/2018   Hyperglycemia without ketosis    Hyperlipidemia    Hypertension    Nuclear sclerotic cataract of left eye 05/14/2019   Past Surgical History:  Procedure Laterality Date   COLONOSCOPY WITH PROPOFOL  N/A 01/15/2019   Procedure: COLONOSCOPY WITH PROPOFOL ;  Surgeon: Therisa Bi, MD;  Location: [facility] ENDOSCOPY;  Service: Gastroenterology;  Laterality: N/A;   Social History   Tobacco Use   Smoking status: Never    Passive exposure: Never   Smokeless tobacco: Never  Vaping Use   Vaping status: Never Used  Substance Use Topics   Alcohol use: Never   Drug use: Never   Social History   Socioeconomic History   Marital status: Single    Spouse name: Not on file   Number of children: Not on file   Years of education: Not on file   Highest education level: High school graduate  Occupational History   Occupation: Retired    Comment: Haematologist  Tobacco Use   Smoking status: Never    Passive exposure: Never   Smokeless tobacco: Never  Vaping Use    Vaping status: Never Used  Substance and Sexual Activity   Alcohol use: Never   Drug use: Never   Sexual activity: Yes    Comment: couple male partners  Other Topics Concern   Not on file  Social History Narrative   Pt lives with his nephew   Social Drivers of Corporate Investment Banker Strain: Low Risk  (08/05/2023)   Overall Financial Resource Strain (CARDIA)    Difficulty of Paying Living Expenses: Not very hard  Food Insecurity: No Food Insecurity (08/05/2023)   Hunger Vital Sign    Worried About Running Out of Food in the Last Year: Never true    Ran Out of Food in the Last Year: Never true  Transportation Needs: No Transportation Needs (08/05/2023)   PRAPARE - Administrator, Civil Service (Medical): No    Lack of Transportation (Non-Medical): No  Physical Activity: Insufficiently Active (08/05/2023)   Exercise Vital Sign    Days of Exercise per Week: 7 days    Minutes of Exercise per Session: 10 min  Stress: No Stress Concern Present (08/05/2023)   [employer] of Occupational Health - Occupational Stress Questionnaire    Feeling of Stress: Not at all  Social Connections: Socially Isolated (08/05/2023)   Social  Connection and Isolation Panel    Frequency of Communication with Friends and Family: More than three times a week    Frequency of Social Gatherings with Friends and Family: More than three times a week    Attends Religious Services: Never    Database Administrator or Organizations: No    Attends Banker Meetings: Never    Marital Status: Divorced  Catering Manager Violence: Not At Risk (08/05/2023)   Humiliation, Afraid, Rape, and Kick questionnaire    Fear of Current or Ex-Partner: No    Emotionally Abused: No    Physically Abused: No    Sexually Abused: No   Family Status  Relation Name Status   Sister  (Not Specified)   Brother  (Not Specified)   Other  (Not Specified)   Mother  Deceased   Father  Deceased   MGM  Deceased   MGF   Deceased   PGM  Deceased   PGF  Deceased  No partnership data on file   Family History  Problem Relation Age of Onset   Kidney failure Sister    Cancer Sister    Diabetes Sister    Hypertension Sister    Hyperlipidemia Sister    Heart disease Sister    Kidney disease Sister    Cancer Brother    CAD Brother    Diabetes Brother    Hypertension Brother    Hyperlipidemia Brother    Heart disease Brother    Diabetes Other    Cancer Mother    No Known Allergies    Review of Systems  All other systems reviewed and are negative.     Objective:     BP 138/84 (Cuff Size: Large)   Temp 98.1 F (36.7 C) (Oral)   Resp 16   Ht 5' (1.524 m)   Wt 181 lb 14.4 oz (82.5 kg)   SpO2 98%   BMI 35.52 kg/m  BP Readings from Last 3 Encounters:  11/07/23 138/84  10/14/23 (!) 148/87  10/10/23 132/80   Wt Readings from Last 3 Encounters:  11/07/23 181 lb 14.4 oz (82.5 kg)  08/05/23 181 lb (82.1 kg)  07/14/23 189 lb (85.7 kg)      Physical Exam Constitutional:      Appearance: Normal appearance.  HENT:     Head: Normocephalic and atraumatic.  Eyes:     Conjunctiva/sclera: Conjunctivae normal.  Cardiovascular:     Rate and Rhythm: Normal rate and regular rhythm.  Pulmonary:     Effort: Pulmonary effort is normal.     Breath sounds: Normal breath sounds.  Skin:    General: Skin is warm and dry.  Neurological:     General: No focal deficit present.     Mental Status: He is alert. Mental status is at baseline.  Psychiatric:        Mood and Affect: Mood normal.        Behavior: Behavior normal.      No results found for any visits on 11/07/23.  Last CBC Lab Results  Component Value Date   WBC 4.0 10/11/2022   HGB 9.8 (L) 10/14/2023   HCT 28.1 (L) 10/11/2022   MCV 95.3 10/11/2022   MCH 31.2 10/11/2022   RDW 12.3 10/11/2022   PLT 287 10/11/2022   Last metabolic panel Lab Results  Component Value Date   GLUCOSE 138 (H) 10/14/2023   NA 139 10/14/2023   K  3.7 10/14/2023   CL 102 10/14/2023  CO2 20 (L) 10/14/2023   BUN 21 10/14/2023   CREATININE 2.08 (H) 10/14/2023   GFRNONAA 34 (L) 10/14/2023   CALCIUM  7.4 (L) 10/20/2023   PHOS 3.8 10/14/2023   PROT 7.2 10/11/2022   ALBUMIN 4.0 10/14/2023   BILITOT 0.3 10/11/2022   ALKPHOS 77 09/02/2017   AST 12 10/11/2022   ALT 11 10/11/2022   ANIONGAP 17 (H) 10/14/2023   Last lipids Lab Results  Component Value Date   CHOL 98 10/11/2022   HDL 35 (L) 10/11/2022   LDLCALC 41 10/11/2022   TRIG 137 10/11/2022   CHOLHDL 2.8 10/11/2022   Last hemoglobin A1c Lab Results  Component Value Date   HGBA1C 5.9 (A) 04/14/2023   Last thyroid functions Lab Results  Component Value Date   TSH 1.62 02/12/2019   Last vitamin D  Lab Results  Component Value Date   VD25OH 33 10/11/2022   Last vitamin B12 and Folate Lab Results  Component Value Date   VITAMINB12 250 08/11/2020   FOLATE 9.3 03/25/2020      The ASCVD Risk score (Arnett DK, et al., 2019) failed to calculate for the following reasons:   The valid total cholesterol range is 130 to 320 mg/dL    Assessment & Plan:   Assessment & Plan Essential hypertension Blood pressure better at 138/84 mmHg. - Switch to olmesartan and amlodipine  combo pill 10-40 mg and switch hydrochlorothiazide  to chlorthalidone 25 mg due to CKD.  - Continue Hydralazine  75 mg TID.  Chronic kidney disease Managed with Farxiga . Nephrology consultation earlier this month, will reach out for records.  - Continue Farxiga  as prescribed. - Recheck labs.   Type 2 diabetes mellitus A1c was 5.9% in May, indicating good control. - Ordered A1c test. - Ordered urine test.  Hyperlipidemia Cholesterol levels require monitoring. - Ordered blood work to check cholesterol levels. - Continue statin.   Hypocalcemia Calcium  levels improved per nephrology consultation. - Continue current calcium  supplementation. - Recheck levels.   History of prostate cancer Under  surveillance with urology. Last checkup showed stable condition. - Continue regular follow-up with urology.  General Health Maintenance Pneumonia and COVID vaccines up to date. Flu vaccine due. - Administered flu vaccine.  - HgB A1c - Urine Microalbumin w/creat. ratio - FARXIGA  10 MG TABS tablet; Take 1 tablet (10 mg total) by mouth daily.  Dispense: 90 tablet; Refill: 1 - Lipid Profile - CBC w/Diff/Platelet - Comprehensive Metabolic Panel (CMET) - Flu vaccine HIGH DOSE PF(Fluzone Trivalent)  Return in about 3 months (around 02/07/2024).    Sharyle Fischer, DO

## 2023-11-07 NOTE — Patient Instructions (Addendum)
 Thanks for meeting with me today!  1. Stop amlodipine  2. Stop olmesartan/hydrochlorothiazide  3. Start amlodipine /olmesartan 10/40 mg once daily 4. Start chlorthalidone 25 mg once daily 5. Continue hydralazine  50 mg 1.5 tablets three times daily

## 2023-11-07 NOTE — Progress Notes (Signed)
 S:    Reason for visit: ?  Cameron Glover is a 73 y.o. male with a history of diabetes (type 2), who presents today for an initial diabetes and hypertension pharmacotherapy visit.? Pertinent PMH also includes HTN, HLD, T2DM, CKD stage 4, and anemia.   Known DM Complications: nephropathy and retinopathy   Care Team: Primary Care Provider: Leavy Mole, PA-C  At last visit, BP was close to goal at 132/8 mmHg. Patient reported a desire to decrease pill burden at that time. Lisinopril /hydrochlorothiazide  was discontinued and olmesartan/hydrochlorothiazide  40/25 mg daily was initiated.   Today, he reports doing well with olmesartan/hydrochlorothiazide .   Current diabetes medications include: Farxiga  10 mg daily Previous diabetes medications include: glipizide , metformin  (CKD) Current hypertension medications include: olmesartan/hydrochlorothiazide  40/25 mg daily, amlodipine  10 mg daily, hydralazine  75 mg TID Current hyperlipidemia medications include: atorvastatin  40 mg daily  Current medication access support: Farxiga  via [employer]  Patient reports adherence to taking all medications as prescribed.   Have you been experiencing any side effects to the medications prescribed? no Do you have any problems obtaining medications due to transportation or finances? No  Insurance coverage: [employer]  Patient denies hypoglycemic events.  Patient denies neuropathy (nerve pain). Patient denies visual changes.  Patient reported dietary habits: Eats 2-3 meals/day  Breakfast: cereal, eggs and bacon Lunch: chicken and beans Dinner: ham sandwich Snacks: cookie, ice cream Drinks: water, soda (infrequently)  Patient-reported exercise habits: walks 10-15 minutes a day at the park  Hypertension: Patient has a validated, automated, upper arm home BP cuff Current blood pressure readings readings: average 146/86  Patient denies hypotensive s/sx including dizziness, lightheadedness   Patient denies hypertensive symptoms including chest pain, shortness of breath, vision changes  DM Prevention:  Statin: Taking; high intensity.?  History of chronic kidney disease? yes History of albuminuria? yes Last eye exam: 07/06/23; Retinopathy Present Lab Results  Component Value Date   HMDIABEYEEXA Retinopathy (A) 07/06/2023   Last foot exam: 04/14/2023 Tobacco Use:   Tobacco Use: Low Risk  (08/05/2023)   Patient History    Smoking Tobacco Use: Never    Smokeless Tobacco Use: Never    Passive Exposure: Never   O:   Vitals:  Wt Readings from Last 3 Encounters:  08/05/23 181 lb (82.1 kg)  07/14/23 189 lb (85.7 kg)  07/12/23 181 lb (82.1 kg)   BP Readings from Last 3 Encounters:  10/14/23 (!) 148/87  10/10/23 132/80  09/16/23 (!) 141/75   Pulse Readings from Last 3 Encounters:  10/14/23 (!) 102  10/10/23 97  09/16/23 63     Labs:?  Lab Results  Component Value Date   HGBA1C 5.9 (A) 04/14/2023   HGBA1C 6.6 (A) 01/13/2023   HGBA1C 7.1 (H) 10/11/2022   GLUCOSE 138 (H) 10/14/2023   MICRALBCREAT 613 (H) 06/21/2022   MICRALBCREAT 945 (H) 03/04/2022   CREATININE 2.08 (H) 10/14/2023   CREATININE 2.50 (H) 08/19/2023   CREATININE 2.88 (H) 06/24/2023    Lab Results  Component Value Date   CHOL 98 10/11/2022   LDLCALC 41 10/11/2022   LDLCALC 44 06/21/2022   LDLCALC 34 08/17/2021   HDL 35 (L) 10/11/2022   TRIG 137 10/11/2022   TRIG 197 (H) 06/21/2022   TRIG 91 08/17/2021   ALT 11 10/11/2022   ALT 18 03/04/2022   AST 12 10/11/2022   AST 13 03/04/2022      Chemistry      Component Value Date/Time   NA 139 10/14/2023 1027  NA 139 05/21/2022 0000   K 3.7 10/14/2023 1027   CL 102 10/14/2023 1027   CO2 20 (L) 10/14/2023 1027   BUN 21 10/14/2023 1027   BUN 45 (A) 05/21/2022 0000   CREATININE 2.08 (H) 10/14/2023 1027   CREATININE 2.04 (H) 10/11/2022 1140   GLU 187 05/21/2022 0000      Component Value Date/Time   CALCIUM  7.4 (L) 10/20/2023 1334    CALCIUM  6.2 (LL) 10/14/2023 1027   ALKPHOS 77 09/02/2017 1738   AST 12 10/11/2022 1140   ALT 11 10/11/2022 1140   BILITOT 0.3 10/11/2022 1140       The ASCVD Risk score (Arnett DK, et al., 2019) failed to calculate for the following reasons:   The valid total cholesterol range is 130 to 320 mg/dL  Lab Results  Component Value Date   MICRALBCREAT 613 (H) 06/21/2022   MICRALBCREAT 945 (H) 03/04/2022    A/P: Hypertension longstanding currently close to goal. Blood pressure goal of <130/80 mmHg. Medication adherence appropriate. Given CKD dx, could be beneficial to switch to from a thiazide-type to a thiazide-like diuretic. Will combine RAAS and CCB at this time to limit pill burden. -Discontinued amlodipine  10 mg daily. -Discontinued olmesartan/hydrochlorothiazide  40/25 mg daily  -Started amlodipine /olmesartan 10/40 mg daily -Started chlorthalidone 25 mg daily -Continued hydralazine  50 mg 1.5 tablets TID. -Continue monitoring BP at home 2-3x per week.   Diabetes currently controlled with a most recent A1c of 5.9% on 04/14/23. Patient is able to verbalize appropriate hypoglycemia management plan. Medication adherence appears appropriate.  -Continued SGLT2-I Farxiga  (dapagliflozin) 10 mg daily -Extensively discussed pathophysiology of diabetes, recommended lifestyle interventions, dietary effects on blood sugar control.  -Counseled on s/sx of and management of hypoglycemia.  -Counseled to continue monitoring BG at home 2-3x per week  ASCVD risk - primary prevention in patient with diabetes. Last LDL is 41 mg/dL, at goal of <29 mg/dL.indicated.  -Continued atorvastatin  40 mg daily.   Written patient instructions provided. Patient verbalized understanding of treatment plan.  Total time in face to face counseling 20 minutes.     Follow-up:  Pharmacist on 12/12/23 PCP on 02/07/23  Peyton CHARLENA Ferries, PharmD Clinical Pharmacist [facility] [phone]

## 2023-11-08 ENCOUNTER — Ambulatory Visit: Payer: Self-pay | Admitting: Internal Medicine

## 2023-11-08 NOTE — Telephone Encounter (Signed)
 Patient and provider portion faxed to PAP team.  Valinda Fedie E. Marsh, PharmD, CPP Clinical Pharmacist [facility] [phone]

## 2023-11-09 ENCOUNTER — Other Ambulatory Visit (HOSPITAL_COMMUNITY): Payer: Self-pay

## 2023-11-09 LAB — CBC WITH DIFFERENTIAL/PLATELET
Absolute Lymphocytes: 902 {cells}/uL (ref 850–3900)
Absolute Monocytes: 396 {cells}/uL (ref 200–950)
Basophils Absolute: 60 {cells}/uL (ref 0–200)
Basophils Relative: 1.3 %
Eosinophils Absolute: 129 {cells}/uL (ref 15–500)
Eosinophils Relative: 2.8 %
HCT: 30.3 % — ABNORMAL LOW (ref 38.5–50.0)
Hemoglobin: 9.9 g/dL — ABNORMAL LOW (ref 13.2–17.1)
MCH: 31.3 pg (ref 27.0–33.0)
MCHC: 32.7 g/dL (ref 32.0–36.0)
MCV: 95.9 fL (ref 80.0–100.0)
MPV: 10.9 fL (ref 7.5–12.5)
Monocytes Relative: 8.6 %
Neutro Abs: 3114 {cells}/uL (ref 1500–7800)
Neutrophils Relative %: 67.7 %
Platelets: 314 Thousand/uL (ref 140–400)
RBC: 3.16 Million/uL — ABNORMAL LOW (ref 4.20–5.80)
RDW: 12.8 % (ref 11.0–15.0)
Total Lymphocyte: 19.6 %
WBC: 4.6 Thousand/uL (ref 3.8–10.8)

## 2023-11-09 LAB — COMPREHENSIVE METABOLIC PANEL WITH GFR
AG Ratio: 1.9 (calc) (ref 1.0–2.5)
ALT: 11 U/L (ref 9–46)
AST: 15 U/L (ref 10–35)
Albumin: 4.4 g/dL (ref 3.6–5.1)
Alkaline phosphatase (APISO): 76 U/L (ref 35–144)
BUN/Creatinine Ratio: 15 (calc) (ref 6–22)
BUN: 30 mg/dL — ABNORMAL HIGH (ref 7–25)
CO2: 21 mmol/L (ref 20–32)
Calcium: 7.2 mg/dL — ABNORMAL LOW (ref 8.6–10.3)
Chloride: 104 mmol/L (ref 98–110)
Creat: 1.94 mg/dL — ABNORMAL HIGH (ref 0.70–1.28)
Globulin: 2.3 g/dL (ref 1.9–3.7)
Glucose, Bld: 176 mg/dL — ABNORMAL HIGH (ref 65–99)
Potassium: 3.8 mmol/L (ref 3.5–5.3)
Sodium: 139 mmol/L (ref 135–146)
Total Bilirubin: 0.3 mg/dL (ref 0.2–1.2)
Total Protein: 6.7 g/dL (ref 6.1–8.1)
eGFR: 37 mL/min/1.73m2 — ABNORMAL LOW (ref >=60)

## 2023-11-09 LAB — MICROALBUMIN / CREATININE URINE RATIO
Creatinine, Urine: 56 mg/dL (ref 20–320)
Microalb Creat Ratio: 602 mg/g{creat} — ABNORMAL HIGH (ref <30)
Microalb, Ur: 33.7 mg/dL

## 2023-11-09 LAB — LIPID PANEL
Cholesterol: 86 mg/dL (ref <200)
HDL: 30 mg/dL — ABNORMAL LOW (ref >=40)
LDL Cholesterol (Calc): 31 mg/dL
Non-HDL Cholesterol (Calc): 56 mg/dL (ref <130)
Total CHOL/HDL Ratio: 2.9 (calc) (ref <5.0)
Triglycerides: 174 mg/dL — ABNORMAL HIGH (ref <150)

## 2023-11-09 LAB — HEMOGLOBIN A1C
Hgb A1c MFr Bld: 5.7 % — ABNORMAL HIGH (ref <5.7)
Mean Plasma Glucose: 117 mg/dL
eAG (mmol/L): 6.5 mmol/L

## 2023-11-09 NOTE — Telephone Encounter (Signed)
 Received provider portion [employer] Farxiga  faxed to [employer] along pt portion.

## 2023-11-11 ENCOUNTER — Ambulatory Visit (HOSPITAL_COMMUNITY)
Admission: RE | Admit: 2023-11-11 | Discharge: 2023-11-11 | Disposition: A | Discharge status: Home / Self Care | Source: Ambulatory Visit | Attending: Internal Medicine | Admitting: Internal Medicine

## 2023-11-11 VITALS — BP 140/81 | HR 100 | Temp 97.8°F | Resp 18

## 2023-11-11 DIAGNOSIS — D631 Anemia in chronic kidney disease: Secondary | ICD-10-CM | POA: Diagnosis not present

## 2023-11-11 DIAGNOSIS — N183 Chronic kidney disease, stage 3 unspecified: Secondary | ICD-10-CM | POA: Diagnosis present

## 2023-11-11 LAB — IRON AND TIBC
Iron: 74 ug/dL (ref 45–182)
Saturation Ratios: 30 % (ref 17.9–39.5)
TIBC: 251 ug/dL (ref 250–450)
UIBC: 177 ug/dL

## 2023-11-11 LAB — FERRITIN: Ferritin: 168 ng/mL (ref 24–336)

## 2023-11-11 LAB — POCT HEMOGLOBIN-HEMACUE: Hemoglobin: 9.8 g/dL — ABNORMAL LOW (ref 13.0–17.0)

## 2023-11-11 MED ORDER — EPOETIN ALFA-EPBX 40000 UNIT/ML IJ SOLN
40000.0000 [IU] | Freq: Once | INTRAMUSCULAR | Status: AC
  Administered 2023-11-11: 40000 [IU] via SUBCUTANEOUS

## 2023-11-11 MED ORDER — EPOETIN ALFA-EPBX 40000 UNIT/ML IJ SOLN
INTRAMUSCULAR | Status: AC
Start: 2023-11-11 — End: 2023-11-11
  Filled 2023-11-11: qty 1

## 2023-11-18 ENCOUNTER — Other Ambulatory Visit (HOSPITAL_COMMUNITY): Payer: Self-pay

## 2023-12-09 ENCOUNTER — Inpatient Hospital Stay (HOSPITAL_COMMUNITY): Admission: RE | Admit: 2023-12-09

## 2023-12-12 ENCOUNTER — Ambulatory Visit

## 2023-12-12 NOTE — Telephone Encounter (Signed)
 Opened in error

## 2023-12-12 NOTE — Progress Notes (Deleted)
 S:     Reason for visit: ?  Cameron Glover is a 73 y.o. male with a history of diabetes (type 2), who presents today for a follow up diabetes Face to Face pharmacotherapy visit.? Pertinent PMH also includes HTN, HLD, T2DM, CKD stage 3b, and anemia.   Known DM Complications: {DM complications:33329}   Care Team: Primary Care Provider: Tapia, Leisa, PA-C (Inactive)  At last visit with clinical pharmacist on 11/07/23, patient was switched to a combination RAAS/CCB agent to limit pill burden. Thiazide-type diuretic was changed to a thiazide-like diuretic given CKD dx.   Current diabetes medications include: Farxiga  10 mg daily Previous diabetes medications include: glipizide , metformin  (CKD) Current hypertension medications include: amlodipine /olmesartan  10/40 mg daily, chlorthalidone  25 mg daily, hydralazine  75 mg TID Current hyperlipidemia medications include: atorvastatin  40 mg daily  Patient reports adherence to taking all medications as prescribed.  *** Patient denies adherence with medications, reports missing *** medications *** times per week, on average.  Have you been experiencing any side effects to the medications prescribed? {YES NO:22349} Do you have any problems obtaining medications due to transportation or finances? {YES NO:22349} Insurance coverage: [facility]  Current medication access support:  Farxiga  via [employer]  Patient {Actions; denies-reports:120008} hypoglycemic events.  Reported home fasting blood sugars: ***  Reported 2 hour post-meal/random blood sugars: ***.  Patient {Actions; denies-reports:120008} nocturia (nighttime urination).  Patient {Actions; denies-reports:120008} neuropathy (nerve pain). Patient {Actions; denies-reports:120008} visual changes. Patient {Actions; denies-reports:120008} self foot exams.   Patient reported dietary habits: Eats 2-3 meals/day  Breakfast: cereal, eggs and bacon Lunch: chicken and beans Dinner: ham  sandwich Snacks: cookie, ice cream Drinks: water, soda (infrequently)   Patient-reported exercise habits: walks 10-15 minutes a day at the park  Hypertension: Patient {HAS/DOES NOT YJCZ:65250} a validated, automated, upper arm home BP cuff Current blood pressure readings readings: ***  Patient {Actions; denies-reports:120008} hypotensive s/sx including ***dizziness, lightheadedness.  Patient {Actions; denies-reports:120008} hypertensive symptoms including ***headache, chest pain, shortness of breath   DM Prevention:  Statin: Taking; high intensity.?  ACE/ARB: yes; amlodipine /olmesartan  History of chronic kidney disease? yes Last urinary albumin/creatinine ratio:  Lab Results  Component Value Date   MICRALBCREAT 602 (H) 11/07/2023   MICRALBCREAT 613 (H) 06/21/2022   MICRALBCREAT 945 (H) 03/04/2022   Last eye exam:  Lab Results  Component Value Date   HMDIABEYEEXA Retinopathy (A) 07/06/2023   Lab Results  Component Value Date   HMDIABEYEEXA Retinopathy (A) 07/06/2023   Last foot exam: 04/14/2023 Tobacco Use:  Tobacco Use: Low Risk  (11/07/2023)   Patient History    Smoking Tobacco Use: Never    Smokeless Tobacco Use: Never    Passive Exposure: Never   O:   Vitals:  Wt Readings from Last 3 Encounters:  11/07/23 181 lb 14.4 oz (82.5 kg)  08/05/23 181 lb (82.1 kg)  07/14/23 189 lb (85.7 kg)   BP Readings from Last 3 Encounters:  11/11/23 (!) 140/81  11/07/23 138/84  10/14/23 (!) 148/87   Pulse Readings from Last 3 Encounters:  11/11/23 100  10/14/23 (!) 102  10/10/23 97     Labs:?  Lab Results  Component Value Date   HGBA1C 5.7 (H) 11/07/2023   HGBA1C 5.9 (A) 04/14/2023   HGBA1C 6.6 (A) 01/13/2023   GLUCOSE 176 (H) 11/07/2023   MICRALBCREAT 602 (H) 11/07/2023   MICRALBCREAT 613 (H) 06/21/2022   MICRALBCREAT 945 (H) 03/04/2022   CREATININE 1.94 (H) 11/07/2023   CREATININE 2.08 (H) 10/14/2023   CREATININE 2.50 (  H) 08/19/2023    Lab Results   Component Value Date   CHOL 86 11/07/2023   LDLCALC 31 11/07/2023   LDLCALC 41 10/11/2022   LDLCALC 44 06/21/2022   HDL 30 (L) 11/07/2023   TRIG 174 (H) 11/07/2023   TRIG 137 10/11/2022   TRIG 197 (H) 06/21/2022   ALT 11 11/07/2023   ALT 11 10/11/2022   AST 15 11/07/2023   AST 12 10/11/2022      Chemistry      Component Value Date/Time   NA 139 11/07/2023 1416   NA 139 05/21/2022 0000   K 3.8 11/07/2023 1416   CL 104 11/07/2023 1416   CO2 21 11/07/2023 1416   BUN 30 (H) 11/07/2023 1416   BUN 45 (A) 05/21/2022 0000   CREATININE 1.94 (H) 11/07/2023 1416   GLU 187 05/21/2022 0000      Component Value Date/Time   CALCIUM  7.2 (L) 11/07/2023 1416   CALCIUM  6.2 (LL) 10/14/2023 1027   ALKPHOS 77 09/02/2017 1738   AST 15 11/07/2023 1416   ALT 11 11/07/2023 1416   BILITOT 0.3 11/07/2023 1416       The ASCVD Risk score (Arnett DK, et al., 2019) failed to calculate for the following reasons:   The valid total cholesterol range is 130 to 320 mg/dL  Lab Results  Component Value Date   MICRALBCREAT 602 (H) 11/07/2023   MICRALBCREAT 613 (H) 06/21/2022   MICRALBCREAT 945 (H) 03/04/2022    A/P: Diabetes currently controlled with a most recent A1c of 5.7% on 11/07/23. Patient is *** able to verbalize appropriate hypoglycemia management plan. Medication adherence appears ***. Control is suboptimal due to ***. -{Meds adjust:18428} basal insulin  {basal insulins:33573}  *** units daily.  -{Meds adjust:18428} rapid insulin  {bolus insulin :33574} ***.  -{Meds adjust:18428} GLP-1 {GLP1 options:33572} *** mg .  -{Meds adjust:18428} SGLT2-I {SGLT2i options:33571}*** mg daily.  -{Meds adjust:18428} metformin  ***.  -Patient educated on purpose, proper use, and potential adverse effects of ***.  -Extensively discussed pathophysiology of diabetes, recommended lifestyle interventions, dietary effects on blood sugar control.  -Counseled on s/sx of and management of hypoglycemia.  -Next A1c  anticipated ***.   ASCVD risk - primary prevention in patient with diabetes. Last LDL is 31 mg/dL, at goal of <29 mg/dL. -Continued atorvastatin  40 mg daily.   Hypertension longstanding *** currently ***. Blood pressure goal of <130/80 *** mmHg. Medication adherence ***. Blood pressure control is suboptimal due to ***. -{Meds adjust:18428} *** mg ***.  {pharmacisttime:33368}  Follow-up:  Pharmacist on *** PCP clinic visit on ***  Peyton CHARLENA Ferries, PharmD, CPP Clinical Pharmacist [facility] [phone]

## 2023-12-14 ENCOUNTER — Inpatient Hospital Stay (HOSPITAL_COMMUNITY): Admission: RE | Admit: 2023-12-14 | Discharge: 2023-12-14 | Attending: Internal Medicine | Admitting: Internal Medicine

## 2023-12-14 VITALS — BP 163/93 | HR 104 | Temp 97.9°F | Resp 17

## 2023-12-14 DIAGNOSIS — N183 Chronic kidney disease, stage 3 unspecified: Secondary | ICD-10-CM | POA: Insufficient documentation

## 2023-12-14 DIAGNOSIS — D631 Anemia in chronic kidney disease: Secondary | ICD-10-CM | POA: Diagnosis present

## 2023-12-14 LAB — RENAL FUNCTION PANEL
Albumin: 3.9 g/dL (ref 3.5–5.0)
Anion gap: 11 (ref 5–15)
BUN: 48 mg/dL — ABNORMAL HIGH (ref 8–23)
CO2: 19 mmol/L — ABNORMAL LOW (ref 22–32)
Calcium: 8.5 mg/dL — ABNORMAL LOW (ref 8.9–10.3)
Chloride: 102 mmol/L (ref 98–111)
Creatinine, Ser: 2.66 mg/dL — ABNORMAL HIGH (ref 0.61–1.24)
GFR, Estimated: 25 mL/min — ABNORMAL LOW (ref >60)
Glucose, Bld: 288 mg/dL — ABNORMAL HIGH (ref 70–99)
Phosphorus: 4.8 mg/dL — ABNORMAL HIGH (ref 2.5–4.6)
Potassium: 4.4 mmol/L (ref 3.5–5.1)
Sodium: 132 mmol/L — ABNORMAL LOW (ref 135–145)

## 2023-12-14 LAB — FERRITIN: Ferritin: 145 ng/mL (ref 24–336)

## 2023-12-14 LAB — IRON AND TIBC
Iron: 67 ug/dL (ref 45–182)
Saturation Ratios: 24 % (ref 17.9–39.5)
TIBC: 286 ug/dL (ref 250–450)
UIBC: 219 ug/dL

## 2023-12-14 LAB — POCT HEMOGLOBIN-HEMACUE: Hemoglobin: 11.2 g/dL — ABNORMAL LOW (ref 13.0–17.0)

## 2023-12-14 MED ORDER — EPOETIN ALFA-EPBX 40000 UNIT/ML IJ SOLN
40000.0000 [IU] | Freq: Once | INTRAMUSCULAR | Status: AC
  Administered 2023-12-14: 40000 [IU] via SUBCUTANEOUS

## 2023-12-14 MED ORDER — EPOETIN ALFA-EPBX 40000 UNIT/ML IJ SOLN
INTRAMUSCULAR | Status: AC
  Filled 2023-12-14: qty 1

## 2023-12-15 LAB — PTH, INTACT AND CALCIUM
Calcium, Total (PTH): 9.3 mg/dL (ref 8.6–10.2)
PTH: 80 pg/mL — ABNORMAL HIGH (ref 15–65)

## 2023-12-19 ENCOUNTER — Other Ambulatory Visit (HOSPITAL_COMMUNITY): Payer: Self-pay | Admitting: Internal Medicine

## 2023-12-19 ENCOUNTER — Telehealth: Payer: Self-pay

## 2023-12-19 NOTE — Progress Notes (Signed)
 Complex Care Management Care Guide Note  12/19/2023 Name: Cameron Glover MRN: [id number] DOB: 11/15/1950  Cameron Glover is a 73 y.o. year old male who is a primary care patient of Tapia, Leisa, PA-C (Inactive) and is actively engaged with the care management team. I reached out to Mgm [facility] by phone today to assist with re-scheduling  with the Pharmacist.  Follow up plan: Face to Face appointment with complex care management team member scheduled for:  12/26/23 at 11:00 a.m.   Dreama [facility]  [facility], [facility] VBCI Assistant Direct Dial: [phone]  Fax: [phone]

## 2023-12-20 ENCOUNTER — Telehealth: Payer: Self-pay

## 2023-12-20 NOTE — Telephone Encounter (Signed)
 Spoke with patient and rescheduled Eligard  and PSA appointment to 01/19/24 per provider request.  Patient verbalized understanding of appointment date and time.  Andrea Kirks LPN

## 2023-12-26 ENCOUNTER — Ambulatory Visit (INDEPENDENT_AMBULATORY_CARE_PROVIDER_SITE_OTHER)

## 2023-12-26 VITALS — BP 127/75 | HR 94

## 2023-12-26 DIAGNOSIS — E1122 Type 2 diabetes mellitus with diabetic chronic kidney disease: Secondary | ICD-10-CM

## 2023-12-26 DIAGNOSIS — E1169 Type 2 diabetes mellitus with other specified complication: Secondary | ICD-10-CM

## 2023-12-26 DIAGNOSIS — E785 Hyperlipidemia, unspecified: Secondary | ICD-10-CM | POA: Diagnosis not present

## 2023-12-26 DIAGNOSIS — Z7984 Long term (current) use of oral hypoglycemic drugs: Secondary | ICD-10-CM

## 2023-12-26 DIAGNOSIS — N183 Chronic kidney disease, stage 3 unspecified: Secondary | ICD-10-CM

## 2023-12-26 DIAGNOSIS — R809 Proteinuria, unspecified: Secondary | ICD-10-CM | POA: Diagnosis not present

## 2023-12-26 DIAGNOSIS — E1129 Type 2 diabetes mellitus with other diabetic kidney complication: Secondary | ICD-10-CM | POA: Diagnosis not present

## 2023-12-26 DIAGNOSIS — I10 Essential (primary) hypertension: Secondary | ICD-10-CM

## 2023-12-26 NOTE — Progress Notes (Unsigned)
 "  S:     Reason for visit: ?  Cameron Glover is a 73 y.o. male with a history of diabetes (type 2), who presents today for a follow up diabetes Face to Face pharmacotherapy visit.? Pertinent PMH also includes HTN, HLD, T2DM, CKD stage 3b, and anemia.   They were referred to the pharmacist by their PCP for assistance in managing diabetes, hypertension, and hyperlipidemia/cardiovascular risk reduction.   Care Team: Primary Care Provider: Dr. Bernardo  At last visit with clinical pharmacist on 11/07/23, patient was switched to a combination RAAS/CCB agent to limit pill burden. Thiazide-type diuretic was changed to a thiazide-like diuretic given CKD dx.   Today, patient presented with pill bottles to clinic. It was determined that pharmacy mistakenly filled olmesartan /hydrochlorothiazide  40/25 mg on 12/09/23. Patient has been taking this and amlodipine /olmesartan  10/40 mg daily for about 10 days based on the number of pills left in the 30 day bottle. Patient reported he was a bit confused as to why the pharmacy filled this, since he thought it was discontinued at last visit.   Current diabetes medications include: Farxiga  10 mg daily Previous diabetes medications include: glipizide , metformin  (CKD) Current hypertension medications include: amlodipine /olmesartan  10/40 mg daily, chlorthalidone  25 mg daily, hydralazine  75 mg TID Current hyperlipidemia medications include: atorvastatin  40 mg daily  Patient reports adherence to taking all medications as prescribed, except for missing the midday dose of hydralazine  about 1-2 times a week.   Have you been experiencing any side effects to the medications prescribed? no Do you have any problems obtaining medications due to transportation or finances? no Insurance coverage: [facility]  Current medication access support:  Farxiga  via [employer]  Patient denies hypoglycemic events.  Reported 2 hour post-meal/random blood sugars: 120-140 mg/dL  Patient  reported dietary habits: Eats 2-3 meals/day  Breakfast: cereal, eggs and bacon Lunch: chicken and beans Dinner: ham sandwich Snacks: cookie, ice cream Drinks: water, soda (infrequently)   Patient-reported exercise habits: walks 10-15 minutes a day at the park  Hypertension: Patient has a validated, automated, upper arm home BP cuff Current blood pressure readings readings: 138/87, 130/80, 121/81, 134/81, 148/94  Patient denies hypotensive s/sx including dizziness, lightheadedness.  Patient denies hypertensive symptoms including headache, chest pain, shortness of breath   DM Prevention:  Statin: Taking; high intensity.?  ACE/ARB: yes; amlodipine /olmesartan  History of chronic kidney disease? yes Last urinary albumin/creatinine ratio:  Lab Results  Component Value Date   MICRALBCREAT 602 (H) 11/07/2023   MICRALBCREAT 613 (H) 06/21/2022   MICRALBCREAT 945 (H) 03/04/2022   Last eye exam:  Lab Results  Component Value Date   HMDIABEYEEXA Retinopathy (A) 07/06/2023   Lab Results  Component Value Date   HMDIABEYEEXA Retinopathy (A) 07/06/2023   Last foot exam: 04/14/2023 Tobacco Use:  Tobacco Use: Low Risk (11/07/2023)   Patient History    Smoking Tobacco Use: Never    Smokeless Tobacco Use: Never    Passive Exposure: Never   O:   Vitals:  Wt Readings from Last 3 Encounters:  11/07/23 181 lb 14.4 oz (82.5 kg)  08/05/23 181 lb (82.1 kg)  07/14/23 189 lb (85.7 kg)   BP Readings from Last 3 Encounters:  12/14/23 (!) 163/93  11/11/23 (!) 140/81  11/07/23 138/84   Pulse Readings from Last 3 Encounters:  12/14/23 (!) 104  11/11/23 100  10/14/23 (!) 102     Labs:?  Lab Results  Component Value Date   HGBA1C 5.7 (H) 11/07/2023   HGBA1C 5.9 (A) 04/14/2023  HGBA1C 6.6 (A) 01/13/2023   GLUCOSE 288 (H) 12/14/2023   MICRALBCREAT 602 (H) 11/07/2023   MICRALBCREAT 613 (H) 06/21/2022   MICRALBCREAT 945 (H) 03/04/2022   CREATININE 2.66 (H) 12/14/2023   CREATININE  1.94 (H) 11/07/2023   CREATININE 2.08 (H) 10/14/2023    Lab Results  Component Value Date   CHOL 86 11/07/2023   LDLCALC 31 11/07/2023   LDLCALC 41 10/11/2022   LDLCALC 44 06/21/2022   HDL 30 (L) 11/07/2023   TRIG 174 (H) 11/07/2023   TRIG 137 10/11/2022   TRIG 197 (H) 06/21/2022   ALT 11 11/07/2023   ALT 11 10/11/2022   AST 15 11/07/2023   AST 12 10/11/2022      Chemistry      Component Value Date/Time   NA 132 (L) 12/14/2023 1011   NA 139 05/21/2022 0000   K 4.4 12/14/2023 1011   CL 102 12/14/2023 1011   CO2 19 (L) 12/14/2023 1011   BUN 48 (H) 12/14/2023 1011   BUN 45 (A) 05/21/2022 0000   CREATININE 2.66 (H) 12/14/2023 1011   CREATININE 1.94 (H) 11/07/2023 1416   GLU 187 05/21/2022 0000      Component Value Date/Time   CALCIUM  8.5 (L) 12/14/2023 1011   CALCIUM  9.3 12/14/2023 1011   ALKPHOS 77 09/02/2017 1738   AST 15 11/07/2023 1416   ALT 11 11/07/2023 1416   BILITOT 0.3 11/07/2023 1416       The ASCVD Risk score (Arnett DK, et al., 2019) failed to calculate for the following reasons:   The valid total cholesterol range is 130 to 320 mg/dL  Lab Results  Component Value Date   MICRALBCREAT 602 (H) 11/07/2023   MICRALBCREAT 613 (H) 06/21/2022   MICRALBCREAT 945 (H) 03/04/2022    A/P: Diabetes currently controlled with a most recent A1c of 5.7% on 11/07/23. Patient is able to verbalize appropriate hypoglycemia management plan. Medication adherence appears appropriate.  -Continued SGLT2-I Farxiga  (dapagliflozin)10 mg daily. Messaged PAP team regarding re-enrollment status -Extensively discussed pathophysiology of diabetes, recommended lifestyle interventions, dietary effects on blood sugar control.  -Counseled on s/sx of and management of hypoglycemia.   ASCVD risk - primary prevention in patient with diabetes. Last LDL is 31 mg/dL, at goal of <29 mg/dL. -Continued atorvastatin  40 mg daily.   Hypertension longstanding currently controlled. Blood pressure  goal of <130/80 mmHg. Medication adherence suoptimal for hydralazine  midday dose. Pharmacy mistakenly dispensed olmesartan /hydrochlorothiazide  leading to a duplication of therapy of both chlorthalidone  and hydrochlorothiazide  and olmesartan  40 mg (x2). No refills left at the pharmacy of olmesartan /hydrochlorothiazide . Per pill bottle, patient has taken 10 days of consecutive therapies. May discuss Kerendia at a future visit given macroalbuminuria and concurrent T2DM, however, will hold off at this time out of concern for hyperkalemia with RAAS agent duplications.  -Continued amlodipine /olmesartan  10/40 mg daily -Continued chlorthalidone  25 mg daily -Continued hydralazine  50 mg 1.5 tablets TID. -Continue monitoring BP at home 2-3x per week.  -Counseled on adherence techniques,such as phone alarms, to assist with midday hydralazine  administration -Counseled patient to contact clinic in the future with any questions about medications. Separated old prescription bottles from daily medications. Patient vocalized understanding on which medications to take.  -Follow up labs: BMET  Patient verbalized understanding of treatment plan. Total time patient counseling 30 minutes.  Follow-up:  Pharmacist on 02/07/24 PCP clinic visit on 02/07/24  Peyton CHARLENA Ferries, PharmD, CPP Clinical Pharmacist [facility] [phone]   "

## 2023-12-27 ENCOUNTER — Ambulatory Visit: Admitting: Urology

## 2023-12-27 ENCOUNTER — Ambulatory Visit: Payer: Self-pay | Admitting: Family Medicine

## 2023-12-27 DIAGNOSIS — E1129 Type 2 diabetes mellitus with other diabetic kidney complication: Secondary | ICD-10-CM

## 2023-12-27 LAB — BASIC METABOLIC PANEL WITH GFR
BUN/Creatinine Ratio: 16 (calc) (ref 6–22)
BUN: 42 mg/dL — ABNORMAL HIGH (ref 7–25)
CO2: 22 mmol/L (ref 20–32)
Calcium: 9.3 mg/dL (ref 8.6–10.3)
Chloride: 101 mmol/L (ref 98–110)
Creat: 2.66 mg/dL — ABNORMAL HIGH (ref 0.70–1.28)
Glucose, Bld: 357 mg/dL — ABNORMAL HIGH (ref 65–99)
Potassium: 5 mmol/L (ref 3.5–5.3)
Sodium: 134 mmol/L — ABNORMAL LOW (ref 135–146)
eGFR: 25 mL/min/1.73m2 — ABNORMAL LOW

## 2023-12-27 MED ORDER — BLOOD GLUCOSE MONITORING SUPPL DEVI: 1.0000 | 0 refills | Status: AC

## 2023-12-27 MED ORDER — LANCET DEVICE MISC: 1.0000 | 0 refills | Status: AC

## 2023-12-27 MED ORDER — BLOOD GLUCOSE TEST VI STRP: 1.0000 | ORAL_STRIP | 5 refills | Status: AC

## 2023-12-27 MED ORDER — LANCETS MISC: 1.0000 | 5 refills | Status: AC

## 2023-12-27 NOTE — Telephone Encounter (Signed)
 Gave [employer] a call to follow up on pt's PAP (Farxiga ) spoke with representative explain they have only received provider portion needed to re-fax pt portion, faxed pt and provider portion today.

## 2023-12-28 NOTE — Telephone Encounter (Signed)
 Received approval letter from [employer] Farxiga  thru 01/03/2025,approval letter index

## 2024-01-02 ENCOUNTER — Ambulatory Visit (INDEPENDENT_AMBULATORY_CARE_PROVIDER_SITE_OTHER)

## 2024-01-02 ENCOUNTER — Encounter (HOSPITAL_COMMUNITY): Payer: Self-pay | Admitting: Internal Medicine

## 2024-01-02 ENCOUNTER — Other Ambulatory Visit: Payer: Self-pay

## 2024-01-02 ENCOUNTER — Telehealth: Payer: Self-pay

## 2024-01-02 DIAGNOSIS — R809 Proteinuria, unspecified: Secondary | ICD-10-CM | POA: Diagnosis not present

## 2024-01-02 DIAGNOSIS — Z794 Long term (current) use of insulin: Secondary | ICD-10-CM | POA: Diagnosis not present

## 2024-01-02 DIAGNOSIS — E1129 Type 2 diabetes mellitus with other diabetic kidney complication: Secondary | ICD-10-CM | POA: Diagnosis not present

## 2024-01-02 MED ORDER — INSULIN GLARGINE 100 UNIT/ML SOLOSTAR PEN: 10.0000 [IU] | PEN_INJECTOR | Freq: Every day | SUBCUTANEOUS | 2 refills | Status: AC

## 2024-01-02 MED ORDER — FREESTYLE LIBRE 3 PLUS SENSOR MISC: 3 refills | Status: AC

## 2024-01-02 MED ORDER — FREESTYLE LIBRE 3 READER DEVI: 0 refills | Status: AC

## 2024-01-02 NOTE — Telephone Encounter (Signed)
-----   Message from Sharyle Fischer, DO sent at 01/02/2024  1:08 PM EST ----- Can we request records from Dr. Norine at [facility] in [place] please? Just last office note and labs thank you. ----- Message ----- From: Issac Peyton BRAVO, RPH-CPP Sent: 01/02/2024  11:31 AM EST To: Warren Gavel, CMA; Sharyle Fischer, DO  Warren,   Would you be able to assist with requesting records for [name] from [facility]? HE sees Dr. Norine.  Allyson E. Marsh, PharmD, CPP Clinical Pharmacist [facility] [phone]

## 2024-01-02 NOTE — Progress Notes (Signed)
 "  S:     Reason for visit: ?  Cameron Glover is a 73 y.o. male with a history of diabetes (type 2), who presents today for a follow up diabetes Face to Face pharmacotherapy visit.? Pertinent PMH also includes HTN, HLD, T2DM, CKD stage 3b, and anemia.   They were referred to the pharmacist by their PCP for assistance in managing diabetes, hypertension, and hyperlipidemia/cardiovascular risk reduction.   Care Team: Primary Care Provider: Dr. Bernardo  At last visit with clinical pharmacist on 12/26/23, it was determined that the  pharmacy mistakenly filled olmesartan /hydrochlorothiazide  40/25 mg and amlodipine /olmesartan  10/40 mg daily for about 10 days. Reported fasting BG was 120-140 mg/dL at that time. However, glucose was elevated at 357 mg/dL on BMET. New BG testing supplies was sent to the pharmacy, however, he was unable to pick this up as he was [place] of the [place] for the [place]  Current diabetes medications include: Farxiga  10 mg daily Previous diabetes medications include: glipizide , metformin  (CKD) Current hypertension medications include: amlodipine /olmesartan  10/40 mg daily, chlorthalidone  25 mg daily, hydralazine  75 mg TID Current hyperlipidemia medications include: atorvastatin  40 mg daily  Patient reports adherence to taking all medications as prescribed, except for missing the midday dose of hydralazine  about 1-2 times a week.   Have you been experiencing any side effects to the medications prescribed? no Do you have any problems obtaining medications due to transportation or finances? no Insurance coverage: [facility] Medicare  Current medication access support:  Farxiga  via [place]  Patient denies hypoglycemic events.  Reported 2 hour post-meal/random blood sugars: 120-140 mg/dL *Patient reports his BG meter is very old  Patient reported dietary habits: Eats 2-3 meals/day  Breakfast: cereal, eggs and bacon Lunch: chicken and beans Dinner: ham sandwich Snacks: cookie, ice  cream Drinks: water, soda (infrequently)   Patient-reported exercise habits: walks 10-15 minutes a day at the park   DM Prevention:  Statin: Taking; high intensity.?  ACE/ARB: yes; amlodipine /olmesartan  History of chronic kidney disease? yes Last urinary albumin/creatinine ratio:  Lab Results  Component Value Date   MICRALBCREAT 602 (H) 11/07/2023   MICRALBCREAT 613 (H) 06/21/2022   MICRALBCREAT 945 (H) 03/04/2022   Last eye exam:  Lab Results  Component Value Date   HMDIABEYEEXA Retinopathy (A) 07/06/2023   Lab Results  Component Value Date   HMDIABEYEEXA Retinopathy (A) 07/06/2023   Last foot exam: 04/14/2023 Tobacco Use:  Tobacco Use: Low Risk (11/07/2023)   Patient History    Smoking Tobacco Use: Never    Smokeless Tobacco Use: Never    Passive Exposure: Never   O:   Vitals:  Wt Readings from Last 3 Encounters:  11/07/23 181 lb 14.4 oz (82.5 kg)  08/05/23 181 lb (82.1 kg)  07/14/23 189 lb (85.7 kg)   BP Readings from Last 3 Encounters:  12/26/23 127/75  12/14/23 (!) 163/93  11/11/23 (!) 140/81   Pulse Readings from Last 3 Encounters:  12/26/23 94  12/14/23 (!) 104  11/11/23 100     Labs:?  Lab Results  Component Value Date   HGBA1C 5.7 (H) 11/07/2023   HGBA1C 5.9 (A) 04/14/2023   HGBA1C 6.6 (A) 01/13/2023   GLUCOSE 357 (H) 12/26/2023   MICRALBCREAT 602 (H) 11/07/2023   MICRALBCREAT 613 (H) 06/21/2022   MICRALBCREAT 945 (H) 03/04/2022   CREATININE 2.66 (H) 12/26/2023   CREATININE 2.66 (H) 12/14/2023   CREATININE 1.94 (H) 11/07/2023    Lab Results  Component Value Date   CHOL 86 11/07/2023  LDLCALC 31 11/07/2023   LDLCALC 41 10/11/2022   LDLCALC 44 06/21/2022   HDL 30 (L) 11/07/2023   TRIG 174 (H) 11/07/2023   TRIG 137 10/11/2022   TRIG 197 (H) 06/21/2022   ALT 11 11/07/2023   ALT 11 10/11/2022   AST 15 11/07/2023   AST 12 10/11/2022      Chemistry      Component Value Date/Time   NA 134 (L) 12/26/2023 1118   NA 139 05/21/2022  0000   K 5.0 12/26/2023 1118   CL 101 12/26/2023 1118   CO2 22 12/26/2023 1118   BUN 42 (H) 12/26/2023 1118   BUN 45 (A) 05/21/2022 0000   CREATININE 2.66 (H) 12/26/2023 1118   GLU 187 05/21/2022 0000      Component Value Date/Time   CALCIUM  9.3 12/26/2023 1118   CALCIUM  9.3 12/14/2023 1011   ALKPHOS 77 09/02/2017 1738   AST 15 11/07/2023 1416   ALT 11 11/07/2023 1416   BILITOT 0.3 11/07/2023 1416       The ASCVD Risk score (Arnett DK, et al., 2019) failed to calculate for the following reasons:   The valid total cholesterol range is 130 to 320 mg/dL  Lab Results  Component Value Date   MICRALBCREAT 602 (H) 11/07/2023   MICRALBCREAT 613 (H) 06/21/2022   MICRALBCREAT 945 (H) 03/04/2022    A/P: Diabetes currently controlled with a most recent A1c of 5.7% on 11/07/23. Patient is able to verbalize appropriate hypoglycemia management plan. Medication adherence appears appropriate. Reported home BG readings are at goal, however, glucose was elevated at 357 mg/dL on BMP. Given CKD dx, could be a good candidate for GLP1 therapy, however, will have to discuss in the new year when [employer] reopens their PAP program because of cost barrier. Will initiate insulin  therapy at this time.  -Started basal insulin  Lantus  (insulin  glargine)  10 units daily.  -Continued SGLT2-I Farxiga  (dapagliflozin)10 mg daily.  -Extensively discussed pathophysiology of diabetes, recommended lifestyle interventions, dietary effects on blood sugar control.  -Patient educated on purpose, proper use, and potential adverse effects of insulin  -Counseled on s/sx of and management of hypoglycemia.  -Provided Freestyle Libre 3+ samples in clinic and educated on use. Sent Rx to pharmacy for [employer] reader  ASCVD risk - primary prevention in patient with diabetes. Last LDL is 31 mg/dL, at goal of <29 mg/dL. -Continued atorvastatin  40 mg daily.   Patient verbalized understanding of treatment plan. Total time  patient counseling 30 minutes.  Follow-up:  Pharmacist on 02/07/24 PCP clinic visit on 02/07/24  Peyton CHARLENA Ferries, PharmD, CPP Clinical Pharmacist [facility] [phone]   "

## 2024-01-03 ENCOUNTER — Telehealth: Payer: Self-pay

## 2024-01-03 NOTE — Telephone Encounter (Signed)
 Patient will require [facility] reader prior authorization. Submitted prior authorization to covermymeds on 01/03/2024  Key: [id number]  Will follow for response.  Camilia Caywood E. Marsh, PharmD, CPP Clinical Pharmacist [facility] [phone]

## 2024-01-04 ENCOUNTER — Other Ambulatory Visit (HOSPITAL_COMMUNITY): Payer: Self-pay

## 2024-01-04 ENCOUNTER — Ambulatory Visit: Admitting: Urology

## 2024-01-04 NOTE — Telephone Encounter (Signed)
 Notification that [employer] reader is approved on insurance. Unable to run test claim as it is in process at pharmacy.  Mahdiya Mossberg E. Marsh, PharmD, CPP Clinical Pharmacist [facility] [phone]

## 2024-01-06 ENCOUNTER — Encounter (HOSPITAL_COMMUNITY)

## 2024-01-11 ENCOUNTER — Ambulatory Visit (HOSPITAL_COMMUNITY)
Admission: RE | Admit: 2024-01-11 | Discharge: 2024-01-11 | Disposition: A | Discharge status: Home / Self Care | Source: Ambulatory Visit | Attending: Internal Medicine | Admitting: Internal Medicine

## 2024-01-11 VITALS — BP 134/80 | HR 113 | Temp 97.6°F | Resp 17

## 2024-01-11 DIAGNOSIS — N183 Chronic kidney disease, stage 3 unspecified: Secondary | ICD-10-CM | POA: Insufficient documentation

## 2024-01-11 DIAGNOSIS — D631 Anemia in chronic kidney disease: Secondary | ICD-10-CM | POA: Diagnosis present

## 2024-01-11 LAB — IRON AND TIBC
Iron: 92 ug/dL (ref 45–182)
Saturation Ratios: 32 % (ref 17.9–39.5)
TIBC: 287 ug/dL (ref 250–450)
UIBC: 195 ug/dL

## 2024-01-11 LAB — FERRITIN: Ferritin: 274 ng/mL (ref 24–336)

## 2024-01-11 LAB — POCT HEMOGLOBIN-HEMACUE: Hemoglobin: 10.9 g/dL — ABNORMAL LOW (ref 13.0–17.0)

## 2024-01-11 MED ORDER — EPOETIN ALFA-EPBX 20000 UNIT/ML IJ SOLN
INTRAMUSCULAR | Status: AC
  Filled 2024-01-11: qty 1

## 2024-01-11 MED ORDER — EPOETIN ALFA-EPBX 20000 UNIT/ML IJ SOLN
20000.0000 [IU] | Freq: Once | INTRAMUSCULAR | Status: AC
  Administered 2024-01-11: 20000 [IU] via SUBCUTANEOUS

## 2024-01-18 NOTE — Progress Notes (Signed)
 "    01/19/2024 2:28 PM   Cameron Glover October 12, 1950 [id number]  Referring provider: Leavy Mole, PA-C No address on file  Urological history: 1. High risk prostate cancer - PSA pending  - bx (2023) iPSA 44, multiple cores of Gleason 3+3 and 3+4 involving 11 of 12 cores. His TRUS volume was 79 cc's.   - bone scan (2023) no mets - gold seed placement (2024)  - ADT x 2 years (started 01/2022)   2. High risk hematuria - non smoker - CTU (2023) NED - cysto (2023) NED  3. BPH with LU TS - cysto (2023) enlarged prostate bilobar coaptation  Chief Complaint  Patient presents with   Prostate Cancer   HPI: Cameron Glover is a 74 y.o. man who presents today for Eligard  injection.    Previous records reviewed.   The patient presents today for routine follow-up while on ADT for management of prostate cancer. He reports tolerating therapy overall, with no new or worsening symptoms. He denies bone pain, urinary retention, hematuria, or new urinary symptoms.  He reports mild fatigue and occasional hot flashes, stable compared to prior visits. No mood changes, gynecomastia, weight loss, or night sweats. Appetite stable. No recent falls. He denies chest pain, shortness of breath, abdominal pain, or neurologic symptoms.  He continues to take vitamin D  and calcium .  PMH: Past Medical History:  Diagnosis Date   Acute renal failure (ARF)    AKI (acute kidney injury) 09/02/2017   Anemia due to chronic kidney disease 04/15/2018   DM (diabetes mellitus) with complications (HCC) 09/02/2017   Elevated lipase 04/15/2018   Hyperglycemia without ketosis    Hyperlipidemia    Hypertension    Nuclear sclerotic cataract of left eye 05/14/2019    Surgical History: Past Surgical History:  Procedure Laterality Date   COLONOSCOPY WITH PROPOFOL  N/A 01/15/2019   Procedure: COLONOSCOPY WITH PROPOFOL ;  Surgeon: Therisa Bi, MD;  Location[facility] ENDOSCOPY;  Service: Gastroenterology;  Laterality: N/A;     Home Medications:  Allergies as of 01/19/2024   No Known Allergies      Medication List        Accurate as of January 19, 2024  2:28 PM. If you have any questions, ask your nurse or doctor.          amLODipine -olmesartan  10-40 MG tablet Commonly known as: AZOR  Take 1 tablet by mouth daily.   atorvastatin  40 MG tablet Commonly known as: LIPITOR Take 1 tablet (40 mg total) by mouth daily.   Blood Glucose Monitoring Suppl Devi 1 each by Does not apply route as directed. Dispense based on patient and insurance preference. Use up to four times daily as directed. (FOR ICD-10 E11.9).   BLOOD GLUCOSE TEST STRIPS Strp 1 each by Does not apply route as directed. Dispense based on patient and insurance preference. Use up to four times daily as directed. (FOR ICD-10 E11.9).   chlorthalidone  25 MG tablet Commonly known as: HYGROTON  Take 1 tablet (25 mg total) by mouth daily.   Farxiga  10 MG Tabs tablet Generic drug: dapagliflozin propanediol  Take 1 tablet (10 mg total) by mouth daily.   FreeStyle Libre 3 Plus Sensor Misc Change sensor every 15 days.   FreeStyle Libre 3 Reader [employer] Use to check blood sugar with Freestyle Libre 3+ sensors   hydrALAZINE  50 MG tablet Commonly known as: APRESOLINE  Take 1.5 tablets (75 mg total) by mouth 3 (three) times daily.   insulin  glargine 100 UNIT/ML Solostar Pen Commonly known as: LANTUS   Inject 10 Units into the skin daily.   Lancet Device Misc 1 each by Does not apply route as directed. Dispense based on patient and insurance preference. Use up to four times daily as directed. (FOR ICD-10 E11.9).   Lancets Misc 1 each by Does not apply route as directed. Dispense based on patient and insurance preference. Use up to four times daily as directed. (FOR ICD-10  E11.9).   Oyster Shell Calcium  500 MG Tabs Take 1 tablet by mouth 2 (two) times daily.        Allergies: No Known Allergies  Family History: Family History  Problem  Relation Age of Onset   Kidney failure Sister    Cancer Sister    Diabetes Sister    Hypertension Sister    Hyperlipidemia Sister    Heart disease Sister    Kidney disease Sister    Cancer Brother    CAD Brother    Diabetes Brother    Hypertension Brother    Hyperlipidemia Brother    Heart disease Brother    Diabetes Other    Cancer Mother     Social History: See HPI for pertinent social history  ROS: Pertinent ROS in HPI  Physical Exam: BP 131/79   Pulse (!) 111   Wt 181 lb (82.1 kg)   SpO2 98%   BMI 35.35 kg/m   Constitutional:  Well nourished. Alert and oriented, No acute distress. HEENT: [place] AT, moist mucus membranes.  Trachea midline Cardiovascular: No clubbing, cyanosis, or edema. Respiratory: Normal respiratory effort, no increased work of breathing. Neurologic: Grossly intact, no focal deficits, moving all 4 extremities. Psychiatric: Normal mood and affect.   Laboratory Data: See EPIC and HPI  I have reviewed the labs.   Pertinent Imaging: N/A  Assessment & Plan:    1. Prostate cancer - PSA pending  - Reviewed injection site reactions could include transient burning and stinging, pain, bruising and redness - Reviewed common side effects include hot flashes, sweats, fatigue, weakness, muscle pain, dizziness, clamminess, testicular shrinkage, decreased erections and enlargement of breasts - Reviewed side effect of thinning of the bones that may lead to a fracture and it is important that he start calcium  supplementation with vitamin D  - Reviewed the increased risks of heart attack, irregular heartbeat, sudden death due to heart problems and stroke - Reviewed that elevated blood sugar and increased risk developing diabetes may occur   2. High risk hematuria - work up 2023 - NED - no reports of gross heme - UA (07/2023) w/o micro heme    Return in about 6 months (around 07/18/2024) for PSA, Eligard  .  These notes generated with voice recognition  software. I apologize for typographical errors.  CLOTILDA HELON RIGGERS  [facility] [address]  [address] [place], [place] [address] [phone]  "

## 2024-01-19 ENCOUNTER — Ambulatory Visit: Admitting: Urology

## 2024-01-19 ENCOUNTER — Telehealth: Payer: Self-pay

## 2024-01-19 ENCOUNTER — Encounter: Payer: Self-pay | Admitting: Urology

## 2024-01-19 VITALS — BP 131/79 | HR 111 | Wt 181.0 lb

## 2024-01-19 DIAGNOSIS — C61 Malignant neoplasm of prostate: Secondary | ICD-10-CM

## 2024-01-19 DIAGNOSIS — R319 Hematuria, unspecified: Secondary | ICD-10-CM

## 2024-01-19 MED ORDER — LEUPROLIDE ACETATE (6 MONTH) 45 MG ~~LOC~~ KIT
45.0000 mg | PACK | Freq: Once | SUBCUTANEOUS | Status: AC
  Administered 2024-01-19: 45 mg via SUBCUTANEOUS

## 2024-01-19 NOTE — Telephone Encounter (Signed)
 Auth Submission: APPROVED Site of care: Urology Payer: [facility] medicare Medication & CPT/J Code(s) submitted: Eligard  Diagnosis Code:  Route of submission (phone, fax, portal): portal Phone # Fax # Auth type: Buy/Bill PB Units/visits requested: 45mg  x 2 doses Reference number: [id number] Approval from: 01/19/24 to 01/18/25

## 2024-01-19 NOTE — Progress Notes (Signed)
 Eligard  SubQ Injection   Due to Prostate Cancer patient is present today for a Eligard  Injection.  Medication: Eligard  6 month Dose: 45 mg  Location: right  Lot: [id number]  NDC# [id number] Exp: 07/2024  Eligard  Approved - 01/19/24-01/18/25  Patient tolerated well, no complications were noted  Performed by: Andrea Kirks LPN   Per  CANDIE Cornwall PA patient is to continue therapy for 6 months . Patient's next follow up was scheduled for 07/03/24. This appointment was scheduled using wheel and given to patient today along with reminder continue on Vitamin D  800-1000iu and Calcium  1000-1200mg  daily while on Androgen Deprivation Therapy.  PA approval dates:  01/19/24-01/18/25

## 2024-01-19 NOTE — Patient Instructions (Signed)
07/03/24 °

## 2024-01-20 ENCOUNTER — Ambulatory Visit: Payer: Self-pay | Admitting: Urology

## 2024-01-20 LAB — PSA: Prostate Specific Ag, Serum: <0.1 ng/mL (ref 0.0–4.0)

## 2024-01-23 NOTE — Telephone Encounter (Signed)
 Advised patient that PSA lab remains undetectable and we will see him at his appointment in June. Patient verbalized understanding of information given.  Andrea Kirks LPN

## 2024-01-23 NOTE — Telephone Encounter (Signed)
-----   Message from [facility] sent at 01/20/2024  8:21 AM EST ----- Please let Cameron Glover know that his PSA remains undetectable and we will see him in June.

## 2024-02-07 ENCOUNTER — Ambulatory Visit: Admitting: Internal Medicine

## 2024-02-07 ENCOUNTER — Ambulatory Visit

## 2024-02-07 NOTE — Telephone Encounter (Unsigned)
 Copied from CRM [id number]. Topic: Appointments - Appointment Cancel/Reschedule >> Feb 06, 2024 11:40 AM Donna BRAVO wrote: Patient/patient representative is calling to cancel or reschedule an appointment. Refer to attachments for appointment information.   Weather conditions/    please call patient to reschedule appt with pharmacist on 02/07/24 ----------------------------------------------------------------------- From previous Reason for Contact - Appt Info/Confirm: Patient/patient representative is calling for information regarding an appointment.  Patient calling asking to reschedule appts 02/07/24 for pharmacy and an office visit  Patient would like to be scheduled on Friday 02/10/24

## 2024-02-08 ENCOUNTER — Other Ambulatory Visit: Payer: Self-pay | Admitting: Family Medicine

## 2024-02-08 ENCOUNTER — Inpatient Hospital Stay (HOSPITAL_COMMUNITY): Admission: RE | Admit: 2024-02-08 | Discharge: 2024-02-08 | Attending: Internal Medicine | Admitting: Internal Medicine

## 2024-02-08 VITALS — BP 159/94 | HR 108 | Temp 96.8°F | Resp 16 | Ht 64.0 in | Wt 181.0 lb

## 2024-02-08 DIAGNOSIS — N184 Chronic kidney disease, stage 4 (severe): Secondary | ICD-10-CM

## 2024-02-08 DIAGNOSIS — I10 Essential (primary) hypertension: Secondary | ICD-10-CM

## 2024-02-08 DIAGNOSIS — N183 Chronic kidney disease, stage 3 unspecified: Secondary | ICD-10-CM

## 2024-02-08 LAB — RENAL FUNCTION PANEL
Albumin: 4.6 g/dL (ref 3.5–5.0)
Anion gap: 14 (ref 5–15)
BUN: 50 mg/dL — ABNORMAL HIGH (ref 8–23)
CO2: 19 mmol/L — ABNORMAL LOW (ref 22–32)
Calcium: 9.6 mg/dL (ref 8.9–10.3)
Chloride: 99 mmol/L (ref 98–111)
Creatinine, Ser: 2.5 mg/dL — ABNORMAL HIGH (ref 0.61–1.24)
GFR, Estimated: 27 mL/min — ABNORMAL LOW
Glucose, Bld: 417 mg/dL — ABNORMAL HIGH (ref 70–99)
Phosphorus: 4.3 mg/dL (ref 2.5–4.6)
Potassium: 5.2 mmol/L — ABNORMAL HIGH (ref 3.5–5.1)
Sodium: 132 mmol/L — ABNORMAL LOW (ref 135–145)

## 2024-02-08 LAB — POCT HEMOGLOBIN-HEMACUE: Hemoglobin: 10.3 g/dL — ABNORMAL LOW (ref 13.0–17.0)

## 2024-02-08 LAB — IRON AND TIBC
Iron: 95 ug/dL (ref 45–182)
Saturation Ratios: 31 % (ref 17.9–39.5)
TIBC: 311 ug/dL (ref 250–450)
UIBC: 216 ug/dL

## 2024-02-08 LAB — FERRITIN: Ferritin: 306 ng/mL (ref 24–336)

## 2024-02-08 MED ORDER — EPOETIN ALFA-EPBX 20000 UNIT/ML IJ SOLN
INTRAMUSCULAR | Status: AC
  Filled 2024-02-08: qty 1

## 2024-02-08 MED ORDER — EPOETIN ALFA-EPBX 20000 UNIT/ML IJ SOLN
20000.0000 [IU] | Freq: Once | INTRAMUSCULAR | Status: AC
  Administered 2024-02-08: 20000 [IU] via SUBCUTANEOUS

## 2024-02-09 LAB — PTH, INTACT AND CALCIUM
Calcium, Total (PTH): 9.7 mg/dL (ref 8.6–10.2)
PTH: 64 pg/mL (ref 15–65)

## 2024-02-10 NOTE — Telephone Encounter (Signed)
 Requested Prescriptions  Refused Prescriptions Disp Refills   olmesartan -hydrochlorothiazide  (BENICAR  HCT) 40-25 MG tablet [Pharmacy Med Name: OLMESARTAN -HCTZ 40-25 MG TAB] 30 tablet 0    Sig: TAKE 1 TABLET BY MOUTH EVERY DAY     Cardiovascular: ARB + Diuretic Combos Failed - 02/10/2024 12:43 PM      Failed - K in normal range and within 180 days    Potassium  Date Value Ref Range Status  02/08/2024 5.2 (H) 3.5 - 5.1 mmol/L Final         Failed - Na in normal range and within 180 days    Sodium  Date Value Ref Range Status  02/08/2024 132 (L) 135 - 145 mmol/L Final  05/21/2022 139 137 - 147 Final         Failed - Cr in normal range and within 180 days    Creat  Date Value Ref Range Status  12/26/2023 2.66 (H) 0.70 - 1.28 mg/dL Final   Creatinine, Ser  Date Value Ref Range Status  02/08/2024 2.50 (H) 0.61 - 1.24 mg/dL Final   Creatinine, Urine  Date Value Ref Range Status  11/07/2023 56 20 - 320 mg/dL Final         Failed - Last BP in normal range    BP Readings from Last 1 Encounters:  02/08/24 (!) 159/94         Passed - eGFR is 10 or above and within 180 days    GFR, Est African American  Date Value Ref Range Status  02/11/2020 58 (L) > OR = 60 mL/min/1.81m2 Final   GFR, Est Non African American  Date Value Ref Range Status  02/11/2020 50 (L) > OR = 60 mL/min/1.44m2 Final   GFR, Estimated  Date Value Ref Range Status  02/08/2024 27 (L) >60 mL/min Final    Comment:    (NOTE) Calculated using the CKD-EPI Creatinine Equation (2021)    eGFR  Date Value Ref Range Status  12/26/2023 25 (L) > OR = 60 mL/min/1.64m2 Final         Passed - Patient is not pregnant      Passed - Valid encounter within last 6 months    Recent Outpatient Visits           3 months ago Controlled type 2 diabetes mellitus with microalbuminuria, without long-term current use of insulin  [facility]   [facility] Bernardo Fend, DO   5 months ago Essential  hypertension   [facility] Peyton BRAVO, [facility]   7 months ago Controlled type 2 diabetes mellitus with microalbuminuria, without long-term current use of insulin  [facility])    [facility] Leavy Mole, PA-C   10 months ago Essential hypertension   [facility] Leavy Mole, PA-C       Future Appointments             In 4 months McGowan, Clotilda DELENA RIGGERS [facility] 

## 2024-02-14 ENCOUNTER — Ambulatory Visit

## 2024-03-07 ENCOUNTER — Ambulatory Visit: Admitting: Internal Medicine

## 2024-03-07 ENCOUNTER — Encounter (HOSPITAL_COMMUNITY)

## 2024-07-03 ENCOUNTER — Ambulatory Visit: Admitting: Urology

## 2024-08-10 ENCOUNTER — Ambulatory Visit
